# Patient Record
Sex: Female | Born: 1945 | Race: Black or African American | Hispanic: No | Marital: Married | Smoking: Never smoker
Health system: Southern US, Community
[De-identification: ages and names within clinical notes are randomized; demographics above are authoritative.]

## PROBLEM LIST (undated history)

## (undated) DIAGNOSIS — L719 Rosacea, unspecified: Secondary | ICD-10-CM

## (undated) DIAGNOSIS — R011 Cardiac murmur, unspecified: Secondary | ICD-10-CM

## (undated) DIAGNOSIS — K9089 Other intestinal malabsorption: Secondary | ICD-10-CM

## (undated) DIAGNOSIS — K635 Polyp of colon: Secondary | ICD-10-CM

## (undated) DIAGNOSIS — K644 Residual hemorrhoidal skin tags: Secondary | ICD-10-CM

## (undated) DIAGNOSIS — T7840XA Allergy, unspecified, initial encounter: Secondary | ICD-10-CM

## (undated) DIAGNOSIS — K5792 Diverticulitis of intestine, part unspecified, without perforation or abscess without bleeding: Secondary | ICD-10-CM

## (undated) DIAGNOSIS — M797 Fibromyalgia: Secondary | ICD-10-CM

## (undated) DIAGNOSIS — IMO0002 Reserved for concepts with insufficient information to code with codable children: Secondary | ICD-10-CM

## (undated) DIAGNOSIS — M1990 Unspecified osteoarthritis, unspecified site: Secondary | ICD-10-CM

## (undated) DIAGNOSIS — H409 Unspecified glaucoma: Secondary | ICD-10-CM

## (undated) DIAGNOSIS — R42 Dizziness and giddiness: Secondary | ICD-10-CM

## (undated) DIAGNOSIS — I773 Arterial fibromuscular dysplasia: Secondary | ICD-10-CM

## (undated) DIAGNOSIS — E039 Hypothyroidism, unspecified: Secondary | ICD-10-CM

## (undated) DIAGNOSIS — K2980 Duodenitis without bleeding: Secondary | ICD-10-CM

## (undated) DIAGNOSIS — I10 Essential (primary) hypertension: Secondary | ICD-10-CM

## (undated) DIAGNOSIS — M810 Age-related osteoporosis without current pathological fracture: Secondary | ICD-10-CM

## (undated) DIAGNOSIS — K589 Irritable bowel syndrome without diarrhea: Secondary | ICD-10-CM

## (undated) DIAGNOSIS — K5289 Other specified noninfective gastroenteritis and colitis: Secondary | ICD-10-CM

## (undated) DIAGNOSIS — K222 Esophageal obstruction: Secondary | ICD-10-CM

## (undated) DIAGNOSIS — K219 Gastro-esophageal reflux disease without esophagitis: Secondary | ICD-10-CM

## (undated) DIAGNOSIS — F419 Anxiety disorder, unspecified: Secondary | ICD-10-CM

## (undated) DIAGNOSIS — K5790 Diverticulosis of intestine, part unspecified, without perforation or abscess without bleeding: Secondary | ICD-10-CM

## (undated) DIAGNOSIS — K209 Esophagitis, unspecified: Secondary | ICD-10-CM

## (undated) DIAGNOSIS — K449 Diaphragmatic hernia without obstruction or gangrene: Secondary | ICD-10-CM

## (undated) DIAGNOSIS — H269 Unspecified cataract: Secondary | ICD-10-CM

## (undated) DIAGNOSIS — K3184 Gastroparesis: Secondary | ICD-10-CM

## (undated) DIAGNOSIS — E785 Hyperlipidemia, unspecified: Secondary | ICD-10-CM

## (undated) HISTORY — DX: Residual hemorrhoidal skin tags: K64.4

## (undated) HISTORY — DX: Duodenitis without bleeding: K29.80

## (undated) HISTORY — DX: Reserved for concepts with insufficient information to code with codable children: IMO0002

## (undated) HISTORY — DX: Diaphragmatic hernia without obstruction or gangrene: K44.9

## (undated) HISTORY — DX: Polyp of colon: K63.5

## (undated) HISTORY — DX: Arterial fibromuscular dysplasia: I77.3

## (undated) HISTORY — DX: Gastro-esophageal reflux disease without esophagitis: K21.9

## (undated) HISTORY — DX: Irritable bowel syndrome, unspecified: K58.9

## (undated) HISTORY — DX: Unspecified osteoarthritis, unspecified site: M19.90

## (undated) HISTORY — DX: Hyperlipidemia, unspecified: E78.5

## (undated) HISTORY — PX: COLONOSCOPY: SHX174

## (undated) HISTORY — DX: Hypothyroidism, unspecified: E03.9

## (undated) HISTORY — DX: Other intestinal malabsorption: K90.89

## (undated) HISTORY — DX: Unspecified cataract: H26.9

## (undated) HISTORY — DX: Fibromyalgia: M79.7

## (undated) HISTORY — DX: Dizziness and giddiness: R42

## (undated) HISTORY — DX: Rosacea, unspecified: L71.9

## (undated) HISTORY — DX: Diverticulosis of intestine, part unspecified, without perforation or abscess without bleeding: K57.90

## (undated) HISTORY — DX: Cardiac murmur, unspecified: R01.1

## (undated) HISTORY — DX: Essential (primary) hypertension: I10

## (undated) HISTORY — DX: Unspecified glaucoma: H40.9

## (undated) HISTORY — DX: Allergy, unspecified, initial encounter: T78.40XA

## (undated) HISTORY — DX: Esophagitis, unspecified: K20.9

## (undated) HISTORY — DX: Anxiety disorder, unspecified: F41.9

## (undated) HISTORY — DX: Age-related osteoporosis without current pathological fracture: M81.0

## (undated) HISTORY — DX: Other specified noninfective gastroenteritis and colitis: K52.89

## (undated) HISTORY — DX: Gastroparesis: K31.84

## (undated) HISTORY — DX: Esophageal obstruction: K22.2

---

## 1981-04-14 HISTORY — PX: TUBAL LIGATION: SHX77

## 1983-04-15 HISTORY — PX: CHOLECYSTECTOMY: SHX55

## 1991-04-15 HISTORY — PX: VAGINAL HYSTERECTOMY: SUR661

## 1998-01-08 ENCOUNTER — Other Ambulatory Visit: Admission: RE | Admit: 1998-01-08 | Discharge: 1998-01-08 | Payer: Self-pay | Admitting: Gastroenterology

## 2000-07-17 ENCOUNTER — Other Ambulatory Visit: Admission: RE | Admit: 2000-07-17 | Discharge: 2000-07-17 | Payer: Self-pay | Admitting: Gastroenterology

## 2000-07-17 ENCOUNTER — Encounter (INDEPENDENT_AMBULATORY_CARE_PROVIDER_SITE_OTHER): Payer: Self-pay | Admitting: Specialist

## 2000-10-29 ENCOUNTER — Encounter: Payer: Self-pay | Admitting: Emergency Medicine

## 2000-10-29 ENCOUNTER — Emergency Department (HOSPITAL_COMMUNITY): Admission: EM | Admit: 2000-10-29 | Discharge: 2000-10-29 | Payer: Self-pay | Admitting: Emergency Medicine

## 2001-02-11 ENCOUNTER — Other Ambulatory Visit: Admission: RE | Admit: 2001-02-11 | Discharge: 2001-02-11 | Payer: Self-pay | Admitting: Obstetrics and Gynecology

## 2002-10-31 ENCOUNTER — Encounter: Admission: RE | Admit: 2002-10-31 | Discharge: 2002-10-31 | Payer: Self-pay | Admitting: Pulmonary Disease

## 2002-10-31 ENCOUNTER — Encounter: Payer: Self-pay | Admitting: Pulmonary Disease

## 2003-04-23 ENCOUNTER — Ambulatory Visit (HOSPITAL_COMMUNITY): Admission: RE | Admit: 2003-04-23 | Discharge: 2003-04-23 | Payer: Self-pay | Admitting: Otolaryngology

## 2003-11-07 ENCOUNTER — Other Ambulatory Visit: Admission: RE | Admit: 2003-11-07 | Discharge: 2003-11-07 | Payer: Self-pay | Admitting: Obstetrics and Gynecology

## 2004-02-21 ENCOUNTER — Ambulatory Visit: Payer: Self-pay | Admitting: Pulmonary Disease

## 2004-04-14 HISTORY — PX: FOOT OSTEOTOMY: SHX957

## 2004-05-09 ENCOUNTER — Ambulatory Visit: Payer: Self-pay | Admitting: Gastroenterology

## 2004-05-10 ENCOUNTER — Ambulatory Visit: Payer: Self-pay | Admitting: Gastroenterology

## 2004-05-10 DIAGNOSIS — K5790 Diverticulosis of intestine, part unspecified, without perforation or abscess without bleeding: Secondary | ICD-10-CM | POA: Insufficient documentation

## 2004-05-10 DIAGNOSIS — K644 Residual hemorrhoidal skin tags: Secondary | ICD-10-CM

## 2004-05-10 HISTORY — DX: Diverticulosis of intestine, part unspecified, without perforation or abscess without bleeding: K57.90

## 2004-05-10 HISTORY — DX: Residual hemorrhoidal skin tags: K64.4

## 2004-05-23 ENCOUNTER — Ambulatory Visit: Payer: Self-pay | Admitting: Pulmonary Disease

## 2004-06-14 ENCOUNTER — Ambulatory Visit: Payer: Self-pay | Admitting: Pulmonary Disease

## 2004-06-18 ENCOUNTER — Ambulatory Visit: Payer: Self-pay | Admitting: Pulmonary Disease

## 2004-07-16 ENCOUNTER — Ambulatory Visit: Payer: Self-pay | Admitting: Pulmonary Disease

## 2004-10-14 ENCOUNTER — Ambulatory Visit: Payer: Self-pay | Admitting: Pulmonary Disease

## 2004-10-30 ENCOUNTER — Emergency Department (HOSPITAL_COMMUNITY): Admission: EM | Admit: 2004-10-30 | Discharge: 2004-10-30 | Payer: Self-pay | Admitting: Emergency Medicine

## 2004-10-30 ENCOUNTER — Ambulatory Visit: Payer: Self-pay | Admitting: Pulmonary Disease

## 2004-10-30 ENCOUNTER — Observation Stay (HOSPITAL_COMMUNITY): Admission: AD | Admit: 2004-10-30 | Discharge: 2004-10-31 | Payer: Self-pay | Admitting: Pulmonary Disease

## 2004-10-31 ENCOUNTER — Ambulatory Visit: Payer: Self-pay | Admitting: Pulmonary Disease

## 2004-11-05 ENCOUNTER — Ambulatory Visit: Payer: Self-pay | Admitting: Pulmonary Disease

## 2005-01-28 ENCOUNTER — Ambulatory Visit: Payer: Self-pay | Admitting: Pulmonary Disease

## 2005-03-18 ENCOUNTER — Ambulatory Visit: Payer: Self-pay | Admitting: Gastroenterology

## 2005-04-10 ENCOUNTER — Ambulatory Visit: Payer: Self-pay | Admitting: Pulmonary Disease

## 2005-04-18 ENCOUNTER — Ambulatory Visit: Payer: Self-pay | Admitting: Gastroenterology

## 2005-04-25 ENCOUNTER — Ambulatory Visit: Payer: Self-pay | Admitting: Gastroenterology

## 2005-04-25 DIAGNOSIS — K209 Esophagitis, unspecified without bleeding: Secondary | ICD-10-CM | POA: Insufficient documentation

## 2005-04-25 DIAGNOSIS — K449 Diaphragmatic hernia without obstruction or gangrene: Secondary | ICD-10-CM

## 2005-04-25 DIAGNOSIS — K2980 Duodenitis without bleeding: Secondary | ICD-10-CM | POA: Insufficient documentation

## 2005-04-25 HISTORY — DX: Diaphragmatic hernia without obstruction or gangrene: K44.9

## 2005-04-25 HISTORY — DX: Duodenitis without bleeding: K29.80

## 2005-04-25 HISTORY — DX: Esophagitis, unspecified without bleeding: K20.90

## 2005-04-28 ENCOUNTER — Ambulatory Visit: Payer: Self-pay | Admitting: Pulmonary Disease

## 2005-06-03 ENCOUNTER — Ambulatory Visit: Payer: Self-pay | Admitting: Gastroenterology

## 2005-07-01 ENCOUNTER — Ambulatory Visit: Payer: Self-pay | Admitting: Gastroenterology

## 2005-08-08 ENCOUNTER — Ambulatory Visit: Payer: Self-pay | Admitting: Pulmonary Disease

## 2005-12-05 ENCOUNTER — Ambulatory Visit: Payer: Self-pay | Admitting: Pulmonary Disease

## 2006-03-17 ENCOUNTER — Ambulatory Visit (HOSPITAL_BASED_OUTPATIENT_CLINIC_OR_DEPARTMENT_OTHER): Admission: RE | Admit: 2006-03-17 | Discharge: 2006-03-17 | Payer: Self-pay | Admitting: Orthopaedic Surgery

## 2006-05-05 ENCOUNTER — Ambulatory Visit: Payer: Self-pay | Admitting: Gastroenterology

## 2006-06-05 ENCOUNTER — Ambulatory Visit: Payer: Self-pay | Admitting: Pulmonary Disease

## 2006-06-23 ENCOUNTER — Ambulatory Visit: Payer: Self-pay | Admitting: Gastroenterology

## 2006-07-01 ENCOUNTER — Ambulatory Visit: Payer: Self-pay | Admitting: Pulmonary Disease

## 2006-07-14 ENCOUNTER — Ambulatory Visit: Payer: Self-pay | Admitting: Gastroenterology

## 2006-11-13 ENCOUNTER — Ambulatory Visit: Payer: Self-pay | Admitting: Pulmonary Disease

## 2007-02-05 ENCOUNTER — Ambulatory Visit: Payer: Self-pay | Admitting: Pulmonary Disease

## 2007-03-05 ENCOUNTER — Ambulatory Visit: Payer: Self-pay | Admitting: Gastroenterology

## 2007-03-05 LAB — CONVERTED CEMR LAB
Basophils Relative: 0.1 % (ref 0.0–1.0)
Eosinophils Relative: 1.8 % (ref 0.0–5.0)
Ferritin: 175.2 ng/mL (ref 10.0–291.0)
Folate: 10.8 ng/mL
Hemoglobin: 13.2 g/dL (ref 12.0–15.0)
Lymphocytes Relative: 59.4 % — ABNORMAL HIGH (ref 12.0–46.0)
Monocytes Absolute: 0.6 10*3/uL (ref 0.2–0.7)
Neutro Abs: 2.8 10*3/uL (ref 1.4–7.7)
Platelets: 419 10*3/uL — ABNORMAL HIGH (ref 150–400)
Saturation Ratios: 28.9 % (ref 20.0–50.0)
Transferrin: 313.7 mg/dL (ref >=212.0)
WBC: 8.6 10*3/uL (ref 4.5–10.5)

## 2007-03-12 ENCOUNTER — Ambulatory Visit: Payer: Self-pay | Admitting: Pulmonary Disease

## 2007-03-30 ENCOUNTER — Encounter: Payer: Self-pay | Admitting: Pulmonary Disease

## 2007-03-30 DIAGNOSIS — F411 Generalized anxiety disorder: Secondary | ICD-10-CM | POA: Insufficient documentation

## 2007-03-30 DIAGNOSIS — K5730 Diverticulosis of large intestine without perforation or abscess without bleeding: Secondary | ICD-10-CM | POA: Insufficient documentation

## 2007-03-30 DIAGNOSIS — K449 Diaphragmatic hernia without obstruction or gangrene: Secondary | ICD-10-CM | POA: Insufficient documentation

## 2007-03-30 DIAGNOSIS — M797 Fibromyalgia: Secondary | ICD-10-CM

## 2007-04-06 ENCOUNTER — Ambulatory Visit: Payer: Self-pay | Admitting: Gastroenterology

## 2007-04-30 ENCOUNTER — Ambulatory Visit: Payer: Self-pay | Admitting: Pulmonary Disease

## 2007-05-14 ENCOUNTER — Ambulatory Visit: Payer: Self-pay | Admitting: Pulmonary Disease

## 2007-05-14 DIAGNOSIS — R0789 Other chest pain: Secondary | ICD-10-CM | POA: Insufficient documentation

## 2007-05-14 DIAGNOSIS — L719 Rosacea, unspecified: Secondary | ICD-10-CM | POA: Insufficient documentation

## 2007-05-14 DIAGNOSIS — R0989 Other specified symptoms and signs involving the circulatory and respiratory systems: Secondary | ICD-10-CM

## 2007-05-14 DIAGNOSIS — M1990 Unspecified osteoarthritis, unspecified site: Secondary | ICD-10-CM | POA: Insufficient documentation

## 2007-05-14 DIAGNOSIS — I10 Essential (primary) hypertension: Secondary | ICD-10-CM | POA: Insufficient documentation

## 2007-05-17 ENCOUNTER — Telehealth (INDEPENDENT_AMBULATORY_CARE_PROVIDER_SITE_OTHER): Payer: Self-pay | Admitting: *Deleted

## 2007-05-21 ENCOUNTER — Ambulatory Visit: Payer: Self-pay

## 2007-05-21 ENCOUNTER — Encounter: Payer: Self-pay | Admitting: Pulmonary Disease

## 2007-06-09 ENCOUNTER — Encounter: Payer: Self-pay | Admitting: Pulmonary Disease

## 2007-07-12 ENCOUNTER — Encounter: Payer: Self-pay | Admitting: Pulmonary Disease

## 2007-07-15 ENCOUNTER — Encounter: Payer: Self-pay | Admitting: Pulmonary Disease

## 2007-07-28 ENCOUNTER — Ambulatory Visit: Payer: Self-pay | Admitting: Pulmonary Disease

## 2007-07-29 LAB — CONVERTED CEMR LAB
Bilirubin Urine: NEGATIVE
Ketones, ur: NEGATIVE mg/dL
Nitrite: NEGATIVE
Total Protein, Urine: NEGATIVE mg/dL

## 2007-08-10 ENCOUNTER — Ambulatory Visit: Payer: Self-pay | Admitting: Pulmonary Disease

## 2007-08-31 ENCOUNTER — Ambulatory Visit: Payer: Self-pay | Admitting: Pulmonary Disease

## 2007-09-02 ENCOUNTER — Emergency Department (HOSPITAL_COMMUNITY): Admission: EM | Admit: 2007-09-02 | Discharge: 2007-09-02 | Payer: Self-pay | Admitting: Emergency Medicine

## 2007-09-10 ENCOUNTER — Ambulatory Visit: Payer: Self-pay | Admitting: Gastroenterology

## 2007-09-13 ENCOUNTER — Ambulatory Visit: Payer: Self-pay | Admitting: Gastroenterology

## 2007-09-13 ENCOUNTER — Encounter: Payer: Self-pay | Admitting: Gastroenterology

## 2007-09-13 DIAGNOSIS — K219 Gastro-esophageal reflux disease without esophagitis: Secondary | ICD-10-CM

## 2007-09-13 DIAGNOSIS — K222 Esophageal obstruction: Secondary | ICD-10-CM | POA: Insufficient documentation

## 2007-09-13 HISTORY — DX: Esophageal obstruction: K22.2

## 2007-09-13 HISTORY — DX: Gastro-esophageal reflux disease without esophagitis: K21.9

## 2007-09-14 ENCOUNTER — Ambulatory Visit: Payer: Self-pay | Admitting: Internal Medicine

## 2007-09-19 ENCOUNTER — Encounter: Payer: Self-pay | Admitting: Gastroenterology

## 2007-09-24 ENCOUNTER — Encounter: Payer: Self-pay | Admitting: Pulmonary Disease

## 2007-09-29 ENCOUNTER — Ambulatory Visit: Payer: Self-pay | Admitting: Pulmonary Disease

## 2007-09-30 ENCOUNTER — Encounter: Payer: Self-pay | Admitting: Gastroenterology

## 2007-10-04 ENCOUNTER — Telehealth: Payer: Self-pay | Admitting: Gastroenterology

## 2007-10-04 LAB — CONVERTED CEMR LAB
ALT: 16 units/L (ref 0–35)
AST: 17 units/L (ref 0–37)
Alkaline Phosphatase: 50 units/L (ref 39–117)
BUN: 15 mg/dL (ref 6–23)
Basophils Relative: 0.5 % (ref 0.0–1.0)
Bilirubin Urine: NEGATIVE
CO2: 31 meq/L (ref 19–32)
Chloride: 104 meq/L (ref 96–112)
Creatinine, Ser: 1 mg/dL (ref 0.4–1.2)
Direct LDL: 138.1 mg/dL
Eosinophils Relative: 2.4 % (ref 0.0–5.0)
GFR calc non Af Amer: 60 mL/min
Hemoglobin: 12.8 g/dL (ref 12.0–15.0)
Ketones, ur: NEGATIVE mg/dL
Lymphocytes Relative: 45.1 % (ref 12.0–46.0)
MCHC: 34 g/dL (ref 30.0–36.0)
Monocytes Relative: 6.6 % (ref 3.0–12.0)
Neutro Abs: 3 10*3/uL (ref 1.4–7.7)
Neutrophils Relative %: 45.4 % (ref 43.0–77.0)
Potassium: 3.6 meq/L (ref 3.5–5.1)
RBC: 4.01 M/uL (ref 3.87–5.11)
Specific Gravity, Urine: >=1.03 (ref 1.000–1.03)
Total Bilirubin: 0.6 mg/dL (ref 0.3–1.2)
Total CHOL/HDL Ratio: 3.3
Urine Glucose: NEGATIVE mg/dL
VLDL: 18 mg/dL (ref 0–40)
WBC: 6.7 10*3/uL (ref 4.5–10.5)
pH: 5.5 (ref 5.0–8.0)

## 2007-10-05 ENCOUNTER — Encounter: Payer: Self-pay | Admitting: Pulmonary Disease

## 2007-10-21 ENCOUNTER — Encounter: Payer: Self-pay | Admitting: Pulmonary Disease

## 2007-11-11 ENCOUNTER — Telehealth: Payer: Self-pay | Admitting: Gastroenterology

## 2007-12-22 ENCOUNTER — Encounter: Payer: Self-pay | Admitting: Pulmonary Disease

## 2008-01-28 ENCOUNTER — Encounter: Payer: Self-pay | Admitting: Pulmonary Disease

## 2008-01-31 ENCOUNTER — Encounter: Payer: Self-pay | Admitting: Pulmonary Disease

## 2008-02-07 ENCOUNTER — Encounter: Payer: Self-pay | Admitting: Pulmonary Disease

## 2008-03-13 ENCOUNTER — Ambulatory Visit: Payer: Self-pay | Admitting: Pulmonary Disease

## 2008-03-17 ENCOUNTER — Telehealth: Payer: Self-pay | Admitting: Pulmonary Disease

## 2008-03-19 LAB — CONVERTED CEMR LAB
Albumin: 3.8 g/dL (ref 3.5–5.2)
BUN: 12 mg/dL (ref 6–23)
Calcium: 9.6 mg/dL (ref 8.4–10.5)
Chloride: 102 meq/L (ref 96–112)
Cholesterol: 188 mg/dL (ref 0–200)
Creatinine, Ser: 0.9 mg/dL (ref 0.4–1.2)
GFR calc Af Amer: 82 mL/min
GFR calc non Af Amer: 68 mL/min
LDL Cholesterol: 89 mg/dL (ref 0–99)
Total Bilirubin: 0.4 mg/dL (ref 0.3–1.2)
Total CHOL/HDL Ratio: 2.3
Total CK: 168 units/L (ref 7–177)
Triglycerides: 95 mg/dL (ref 0–149)
VLDL: 19 mg/dL (ref 0–40)
Vit D, 1,25-Dihydroxy: 51 (ref 30–89)

## 2008-03-29 ENCOUNTER — Telehealth: Payer: Self-pay | Admitting: Gastroenterology

## 2008-03-30 ENCOUNTER — Ambulatory Visit: Payer: Self-pay | Admitting: Gastroenterology

## 2008-03-30 DIAGNOSIS — R1084 Generalized abdominal pain: Secondary | ICD-10-CM

## 2008-03-30 LAB — CONVERTED CEMR LAB
ALT: 18 units/L (ref 0–35)
AST: 17 units/L (ref 0–37)
Albumin: 3.5 g/dL (ref 3.5–5.2)
Alkaline Phosphatase: 55 units/L (ref 39–117)
Basophils Absolute: 0.3 10*3/uL — ABNORMAL HIGH (ref 0.0–0.1)
Basophils Relative: 3.7 % — ABNORMAL HIGH (ref 0.0–3.0)
Eosinophils Relative: 1.7 % (ref 0.0–5.0)
Hemoglobin: 11.8 g/dL — ABNORMAL LOW (ref 12.0–15.0)
Lipase: 24 units/L (ref 11.0–59.0)
Lymphocytes Relative: 35.8 % (ref 12.0–46.0)
MCHC: 34 g/dL (ref 30.0–36.0)
Monocytes Relative: 9.5 % (ref 3.0–12.0)
Neutro Abs: 4.4 10*3/uL (ref 1.4–7.7)
Neutrophils Relative %: 49.3 % (ref 43.0–77.0)
RBC: 3.8 M/uL — ABNORMAL LOW (ref 3.87–5.11)
Sed Rate: 48 mm/hr — ABNORMAL HIGH (ref 0–22)
WBC: 9 10*3/uL (ref 4.5–10.5)

## 2008-03-31 ENCOUNTER — Telehealth: Payer: Self-pay | Admitting: Gastroenterology

## 2008-04-03 ENCOUNTER — Telehealth: Payer: Self-pay | Admitting: Gastroenterology

## 2008-04-04 ENCOUNTER — Ambulatory Visit: Payer: Self-pay | Admitting: Internal Medicine

## 2008-04-06 ENCOUNTER — Ambulatory Visit: Payer: Self-pay | Admitting: Gastroenterology

## 2008-04-06 DIAGNOSIS — R141 Gas pain: Secondary | ICD-10-CM

## 2008-04-06 DIAGNOSIS — R142 Eructation: Secondary | ICD-10-CM

## 2008-04-06 DIAGNOSIS — R143 Flatulence: Secondary | ICD-10-CM

## 2008-04-13 ENCOUNTER — Telehealth: Payer: Self-pay | Admitting: Gastroenterology

## 2008-04-17 ENCOUNTER — Ambulatory Visit: Payer: Self-pay | Admitting: Gastroenterology

## 2008-05-22 ENCOUNTER — Encounter: Payer: Self-pay | Admitting: Pulmonary Disease

## 2008-06-05 ENCOUNTER — Encounter: Payer: Self-pay | Admitting: Pulmonary Disease

## 2008-06-12 ENCOUNTER — Ambulatory Visit: Payer: Self-pay | Admitting: Internal Medicine

## 2008-06-14 LAB — CONVERTED CEMR LAB
AST: 19 units/L (ref 0–37)
Alkaline Phosphatase: 47 units/L (ref 39–117)
Bilirubin, Direct: 0.1 mg/dL (ref 0.0–0.3)
Total Protein: 6.9 g/dL (ref 6.0–8.3)

## 2008-06-21 ENCOUNTER — Encounter: Payer: Self-pay | Admitting: Pulmonary Disease

## 2008-07-10 ENCOUNTER — Ambulatory Visit: Payer: Self-pay | Admitting: Internal Medicine

## 2008-07-17 ENCOUNTER — Encounter: Payer: Self-pay | Admitting: Gastroenterology

## 2008-07-21 ENCOUNTER — Encounter: Admission: RE | Admit: 2008-07-21 | Discharge: 2008-07-21 | Payer: Self-pay | Admitting: Neurology

## 2008-07-21 ENCOUNTER — Encounter: Payer: Self-pay | Admitting: Pulmonary Disease

## 2008-07-29 ENCOUNTER — Encounter: Admission: RE | Admit: 2008-07-29 | Discharge: 2008-07-29 | Payer: Self-pay | Admitting: Neurology

## 2008-07-29 ENCOUNTER — Encounter: Payer: Self-pay | Admitting: Pulmonary Disease

## 2008-08-07 ENCOUNTER — Telehealth: Payer: Self-pay | Admitting: Gastroenterology

## 2008-08-08 ENCOUNTER — Ambulatory Visit: Payer: Self-pay | Admitting: Gastroenterology

## 2008-08-08 ENCOUNTER — Encounter: Payer: Self-pay | Admitting: Physician Assistant

## 2008-08-10 ENCOUNTER — Ambulatory Visit: Payer: Self-pay | Admitting: Gastroenterology

## 2008-08-11 LAB — CONVERTED CEMR LAB
Basophils Relative: 0.8 % (ref 0.0–3.0)
Eosinophils Relative: 1.9 % (ref 0.0–5.0)
HCT: 35.4 % — ABNORMAL LOW (ref 36.0–46.0)
Lymphs Abs: 4 10*3/uL (ref 0.7–4.0)
MCHC: 35.5 g/dL (ref 30.0–36.0)
MCV: 90.5 fL (ref 78.0–100.0)
Monocytes Absolute: 0.8 10*3/uL (ref 0.1–1.0)
RBC: 3.91 M/uL (ref 3.87–5.11)
WBC: 8.8 10*3/uL (ref 4.5–10.5)

## 2008-08-14 ENCOUNTER — Ambulatory Visit: Payer: Self-pay | Admitting: Pulmonary Disease

## 2008-08-14 DIAGNOSIS — R350 Frequency of micturition: Secondary | ICD-10-CM | POA: Insufficient documentation

## 2008-08-15 LAB — CONVERTED CEMR LAB
Bilirubin Urine: NEGATIVE
Ketones, ur: NEGATIVE mg/dL
Total Protein, Urine: NEGATIVE mg/dL
Urine Glucose: NEGATIVE mg/dL
pH: 5.5 (ref 5.0–8.0)

## 2008-09-14 ENCOUNTER — Telehealth (INDEPENDENT_AMBULATORY_CARE_PROVIDER_SITE_OTHER): Payer: Self-pay | Admitting: *Deleted

## 2008-09-14 ENCOUNTER — Encounter (INDEPENDENT_AMBULATORY_CARE_PROVIDER_SITE_OTHER): Payer: Self-pay | Admitting: *Deleted

## 2008-09-20 ENCOUNTER — Telehealth: Payer: Self-pay | Admitting: Gastroenterology

## 2008-09-28 ENCOUNTER — Ambulatory Visit: Payer: Self-pay | Admitting: Gastroenterology

## 2008-10-18 ENCOUNTER — Encounter: Payer: Self-pay | Admitting: Pulmonary Disease

## 2008-11-10 ENCOUNTER — Encounter: Payer: Self-pay | Admitting: Pulmonary Disease

## 2008-12-11 ENCOUNTER — Ambulatory Visit: Payer: Self-pay | Admitting: Pulmonary Disease

## 2008-12-11 DIAGNOSIS — J0110 Acute frontal sinusitis, unspecified: Secondary | ICD-10-CM

## 2008-12-11 DIAGNOSIS — J019 Acute sinusitis, unspecified: Secondary | ICD-10-CM | POA: Insufficient documentation

## 2008-12-15 ENCOUNTER — Ambulatory Visit: Payer: Self-pay | Admitting: Pulmonary Disease

## 2008-12-19 ENCOUNTER — Ambulatory Visit: Payer: Self-pay | Admitting: Pulmonary Disease

## 2009-01-04 LAB — CONVERTED CEMR LAB
Basophils Relative: 0.8 % (ref 0.0–3.0)
Bilirubin, Direct: 0.1 mg/dL (ref 0.0–0.3)
CO2: 30 meq/L (ref 19–32)
Calcium: 9.5 mg/dL (ref 8.4–10.5)
Creatinine, Ser: 0.9 mg/dL (ref 0.4–1.2)
Eosinophils Absolute: 0.2 10*3/uL (ref 0.0–0.7)
HDL: 63.8 mg/dL (ref >39.00)
Hemoglobin: 12.6 g/dL (ref 12.0–15.0)
LDL Cholesterol: 76 mg/dL (ref 0–99)
Lymphocytes Relative: 50.4 % — ABNORMAL HIGH (ref 12.0–46.0)
MCHC: 34 g/dL (ref 30.0–36.0)
Neutro Abs: 2.1 10*3/uL (ref 1.4–7.7)
RBC: 4 M/uL (ref 3.87–5.11)
Total Bilirubin: 0.5 mg/dL (ref 0.3–1.2)
Total CHOL/HDL Ratio: 3
Total Protein: 7.4 g/dL (ref 6.0–8.3)
Triglycerides: 101 mg/dL (ref 0.0–149.0)
VLDL: 20.2 mg/dL (ref 0.0–40.0)

## 2009-02-12 ENCOUNTER — Ambulatory Visit: Payer: Self-pay | Admitting: Pulmonary Disease

## 2009-02-15 ENCOUNTER — Encounter: Payer: Self-pay | Admitting: Pulmonary Disease

## 2009-02-22 ENCOUNTER — Encounter: Payer: Self-pay | Admitting: Pulmonary Disease

## 2009-03-27 ENCOUNTER — Encounter: Payer: Self-pay | Admitting: Pulmonary Disease

## 2009-04-20 ENCOUNTER — Encounter (INDEPENDENT_AMBULATORY_CARE_PROVIDER_SITE_OTHER): Payer: Self-pay | Admitting: *Deleted

## 2009-05-01 ENCOUNTER — Telehealth (INDEPENDENT_AMBULATORY_CARE_PROVIDER_SITE_OTHER): Payer: Self-pay | Admitting: *Deleted

## 2009-05-01 ENCOUNTER — Ambulatory Visit: Payer: Self-pay | Admitting: Gastroenterology

## 2009-05-01 ENCOUNTER — Encounter (INDEPENDENT_AMBULATORY_CARE_PROVIDER_SITE_OTHER): Payer: Self-pay | Admitting: *Deleted

## 2009-05-17 ENCOUNTER — Encounter (INDEPENDENT_AMBULATORY_CARE_PROVIDER_SITE_OTHER): Payer: Self-pay

## 2009-05-18 ENCOUNTER — Ambulatory Visit: Payer: Self-pay | Admitting: Gastroenterology

## 2009-05-23 ENCOUNTER — Telehealth: Payer: Self-pay | Admitting: Gastroenterology

## 2009-05-29 ENCOUNTER — Telehealth: Payer: Self-pay | Admitting: Gastroenterology

## 2009-05-29 ENCOUNTER — Telehealth (INDEPENDENT_AMBULATORY_CARE_PROVIDER_SITE_OTHER): Payer: Self-pay | Admitting: *Deleted

## 2009-05-29 ENCOUNTER — Ambulatory Visit: Payer: Self-pay | Admitting: Gastroenterology

## 2009-05-30 ENCOUNTER — Ambulatory Visit: Payer: Self-pay | Admitting: Gastroenterology

## 2009-05-30 DIAGNOSIS — K635 Polyp of colon: Secondary | ICD-10-CM

## 2009-05-30 HISTORY — DX: Polyp of colon: K63.5

## 2009-06-04 ENCOUNTER — Encounter: Payer: Self-pay | Admitting: Pulmonary Disease

## 2009-06-04 ENCOUNTER — Encounter: Payer: Self-pay | Admitting: Gastroenterology

## 2009-07-20 ENCOUNTER — Ambulatory Visit: Payer: Self-pay | Admitting: Pulmonary Disease

## 2009-07-20 DIAGNOSIS — E039 Hypothyroidism, unspecified: Secondary | ICD-10-CM | POA: Insufficient documentation

## 2009-07-20 DIAGNOSIS — E559 Vitamin D deficiency, unspecified: Secondary | ICD-10-CM

## 2009-07-22 LAB — CONVERTED CEMR LAB
Basophils Relative: 0.5 % (ref 0.0–3.0)
Bilirubin, Direct: 0.1 mg/dL (ref 0.0–0.3)
Calcium: 9.7 mg/dL (ref 8.4–10.5)
Creatinine, Ser: 0.9 mg/dL (ref 0.4–1.2)
Eosinophils Absolute: 0.2 10*3/uL (ref 0.0–0.7)
Eosinophils Relative: 2.4 % (ref 0.0–5.0)
GFR calc non Af Amer: 81.33 mL/min (ref >60)
HDL: 73 mg/dL (ref >39.00)
Hemoglobin: 12.8 g/dL (ref 12.0–15.0)
LDL Cholesterol: 85 mg/dL (ref 0–99)
MCHC: 33.8 g/dL (ref 30.0–36.0)
MCV: 91.4 fL (ref 78.0–100.0)
Monocytes Absolute: 0.6 10*3/uL (ref 0.1–1.0)
Neutro Abs: 3 10*3/uL (ref 1.4–7.7)
RBC: 4.15 M/uL (ref 3.87–5.11)
Total Bilirubin: 0.6 mg/dL (ref 0.3–1.2)
Total CHOL/HDL Ratio: 2
Triglycerides: 117 mg/dL (ref 0.0–149.0)
VLDL: 23.4 mg/dL (ref 0.0–40.0)

## 2009-08-24 ENCOUNTER — Ambulatory Visit: Payer: Self-pay | Admitting: Pulmonary Disease

## 2009-08-24 DIAGNOSIS — J309 Allergic rhinitis, unspecified: Secondary | ICD-10-CM | POA: Insufficient documentation

## 2009-09-14 ENCOUNTER — Telehealth: Payer: Self-pay | Admitting: Pulmonary Disease

## 2009-10-28 ENCOUNTER — Telehealth: Payer: Self-pay | Admitting: Internal Medicine

## 2009-12-05 ENCOUNTER — Encounter: Payer: Self-pay | Admitting: Pulmonary Disease

## 2009-12-07 ENCOUNTER — Telehealth: Payer: Self-pay | Admitting: Gastroenterology

## 2009-12-07 ENCOUNTER — Telehealth: Payer: Self-pay | Admitting: Pulmonary Disease

## 2009-12-07 ENCOUNTER — Ambulatory Visit: Payer: Self-pay | Admitting: Gastroenterology

## 2009-12-07 LAB — CONVERTED CEMR LAB
AST: 22 units/L (ref 0–37)
Albumin: 3.8 g/dL (ref 3.5–5.2)
BUN: 17 mg/dL (ref 6–23)
Basophils Relative: 0.1 % (ref 0.0–3.0)
Calcium: 9.9 mg/dL (ref 8.4–10.5)
Eosinophils Absolute: 0 10*3/uL (ref 0.0–0.7)
Ferritin: 45.6 ng/mL (ref 10.0–291.0)
Folate: 8.8 ng/mL
GFR calc non Af Amer: 85.61 mL/min (ref >60)
HCT: 36.1 % (ref 36.0–46.0)
Iron: 98 ug/dL (ref 42–145)
Lipase: 29 units/L (ref 11.0–59.0)
Lymphs Abs: 1.9 10*3/uL (ref 0.7–4.0)
MCHC: 34.2 g/dL (ref 30.0–36.0)
MCV: 92.6 fL (ref 78.0–100.0)
Monocytes Absolute: 0.7 10*3/uL (ref 0.1–1.0)
Neutrophils Relative %: 79.5 % — ABNORMAL HIGH (ref 43.0–77.0)
Platelets: 301 10*3/uL (ref 150.0–400.0)
Potassium: 3.9 meq/L (ref 3.5–5.1)
RBC: 3.9 M/uL (ref 3.87–5.11)
TSH: 0.1 microintl units/mL — ABNORMAL LOW (ref 0.35–5.50)
Total Bilirubin: 0.5 mg/dL (ref 0.3–1.2)
Vitamin B-12: 489 pg/mL (ref 211–911)

## 2009-12-10 ENCOUNTER — Encounter: Payer: Self-pay | Admitting: Gastroenterology

## 2009-12-11 ENCOUNTER — Encounter: Payer: Self-pay | Admitting: Gastroenterology

## 2009-12-12 ENCOUNTER — Ambulatory Visit: Payer: Self-pay | Admitting: Cardiovascular Disease

## 2009-12-14 ENCOUNTER — Telehealth: Payer: Self-pay | Admitting: Pulmonary Disease

## 2009-12-15 ENCOUNTER — Observation Stay (HOSPITAL_COMMUNITY): Admission: EM | Admit: 2009-12-15 | Discharge: 2009-12-16 | Payer: Self-pay | Admitting: Emergency Medicine

## 2009-12-16 ENCOUNTER — Encounter (INDEPENDENT_AMBULATORY_CARE_PROVIDER_SITE_OTHER): Payer: Self-pay | Admitting: Internal Medicine

## 2009-12-19 ENCOUNTER — Ambulatory Visit: Payer: Self-pay | Admitting: Pulmonary Disease

## 2009-12-19 ENCOUNTER — Encounter (INDEPENDENT_AMBULATORY_CARE_PROVIDER_SITE_OTHER): Payer: Self-pay | Admitting: *Deleted

## 2009-12-19 DIAGNOSIS — R002 Palpitations: Secondary | ICD-10-CM

## 2009-12-20 DIAGNOSIS — E785 Hyperlipidemia, unspecified: Secondary | ICD-10-CM

## 2009-12-20 DIAGNOSIS — E1169 Type 2 diabetes mellitus with other specified complication: Secondary | ICD-10-CM | POA: Insufficient documentation

## 2009-12-21 ENCOUNTER — Ambulatory Visit: Payer: Self-pay | Admitting: Gastroenterology

## 2009-12-24 ENCOUNTER — Ambulatory Visit: Payer: Self-pay | Admitting: Cardiology

## 2009-12-26 ENCOUNTER — Ambulatory Visit: Payer: Self-pay | Admitting: Gastroenterology

## 2009-12-28 ENCOUNTER — Encounter: Payer: Self-pay | Admitting: Gastroenterology

## 2009-12-28 ENCOUNTER — Encounter: Payer: Self-pay | Admitting: Pulmonary Disease

## 2009-12-28 DIAGNOSIS — I7789 Other specified disorders of arteries and arterioles: Secondary | ICD-10-CM

## 2009-12-31 ENCOUNTER — Encounter: Payer: Self-pay | Admitting: Pulmonary Disease

## 2009-12-31 ENCOUNTER — Ambulatory Visit: Payer: Self-pay

## 2010-01-04 ENCOUNTER — Telehealth (INDEPENDENT_AMBULATORY_CARE_PROVIDER_SITE_OTHER): Payer: Self-pay | Admitting: *Deleted

## 2010-01-07 ENCOUNTER — Ambulatory Visit (HOSPITAL_COMMUNITY): Admission: RE | Admit: 2010-01-07 | Discharge: 2010-01-07 | Payer: Self-pay | Admitting: Gastroenterology

## 2010-01-07 ENCOUNTER — Ambulatory Visit: Payer: Self-pay | Admitting: Gastroenterology

## 2010-01-28 ENCOUNTER — Ambulatory Visit: Payer: Self-pay | Admitting: Cardiology

## 2010-01-28 ENCOUNTER — Encounter: Payer: Self-pay | Admitting: Cardiology

## 2010-01-28 ENCOUNTER — Ambulatory Visit (HOSPITAL_COMMUNITY): Admission: RE | Admit: 2010-01-28 | Discharge: 2010-01-28 | Payer: Self-pay | Admitting: Cardiology

## 2010-01-28 ENCOUNTER — Ambulatory Visit: Payer: Self-pay

## 2010-02-11 ENCOUNTER — Ambulatory Visit: Payer: Self-pay | Admitting: Cardiology

## 2010-03-13 ENCOUNTER — Telehealth (INDEPENDENT_AMBULATORY_CARE_PROVIDER_SITE_OTHER): Payer: Self-pay | Admitting: *Deleted

## 2010-03-21 ENCOUNTER — Ambulatory Visit: Payer: Self-pay | Admitting: Pulmonary Disease

## 2010-04-18 ENCOUNTER — Ambulatory Visit: Admit: 2010-04-18 | Payer: Self-pay | Admitting: Pulmonary Disease

## 2010-04-19 ENCOUNTER — Telehealth: Payer: Self-pay | Admitting: Gastroenterology

## 2010-05-14 NOTE — Letter (Signed)
Summary: Patient Notice- Polyp Results  [facility]  [address] [place], [place] [address]   Phone: [phone]  Fax: [phone]        June 04, 2009 MRN: [id number]    [facility] [address] [place], [place]  [address]    Dear Ms. Fricke,  I am pleased to inform you that the colon polyp(s) removed during your recent colonoscopy was (were) found to be benign (no cancer detected) upon pathologic examination.  I recommend you have a repeat colonoscopy examination in 5_ years to look for recurrent polyps, as having colon polyps increases your risk for having recurrent polyps or even colon cancer in the future.  Should you develop new or worsening symptoms of abdominal pain, bowel habit changes or bleeding from the rectum or bowels, please schedule an evaluation with either your primary care physician or with me.  Additional information/recommendations:  __ No further action with gastroenterology is needed at this time. Please      follow-up with your primary care physician for your other healthcare      needs.  __ Please call [phone] to schedule a return visit to review your      situation.  __ Please keep your follow-up visit as already scheduled.  xx__ Continue treatment plan as outlined the day of your exam.  Please call us if you are having persistent problems or have questions about your condition that have not been fully answered at this time.  Sincerely,  Mardella Layman MD [facility]  This letter has been electronically signed by your physician.  Appended Document: Patient Notice- Polyp Results Letter mailed 2.22.11.

## 2010-05-14 NOTE — Progress Notes (Signed)
Summary: Questions about procedure   Phone Note Call from Patient Call back at [phone]   Caller: Patient Call For: Dr. Jarold Motto Reason for Call: Talk to Nurse Summary of Call: Pt has a colonoscopy scheduled for tomorrow and ate some peanuts yesterday. Approx. 2 handfuls and wants know if that will interfere Initial call taken by: Karna Christmas,  May 29, 2009 9:42 AM  Follow-up for Phone Call        questions answered when patient was here for her TB test,.  Follow-up by: Harlow Mares CMA (AAMA),  May 29, 2009 10:10 AM

## 2010-05-14 NOTE — Progress Notes (Signed)
Summary: medication questions  Phone Note Call from Patient Call back at [phone]   Caller: Patient Call For: Trellis Vanoverbeke Reason for Call: Talk to Nurse Summary of Call: Patient has questions about the medication she is taking. she would like to talk to a nurse about this.  Initial call taken by: Valinda Hoar,  December 07, 2009 11:47 AM  Follow-up for Phone Call        Pt c/o increased muscle weakness and muscle pain. Per specialist Dr. Marcene Corning and Dr. Amelia Jo. Dr. Clarisse Gouge has ordered pt to have blood work done "to check muscles". Pt wants to know if it is okay to come off of Simvastatin. Please advise. Thanks. Zackery Barefoot CMA  December 07, 2009 11:58 AM   Additional Follow-up for Phone Call Additional follow up Details #1::        per SN---yes this is ok to stop the simvastatin.  eplained to pt that she will just need to do low chol/low fat diet.  pt voiced her understanding of this and will call back for any questions or concerns. Randell Loop [facility]  December 07, 2009 3:57 PM

## 2010-05-14 NOTE — Progress Notes (Signed)
Summary: heart pounding x 3 days-req to be seen  Phone Note Call from Patient   Caller: Patient Call For: nadel Summary of Call: pt says she has awaken for 3 nights w/ "pounding in her chest". she says she has been sweating but says that's normal as she often has hot flashes. i asked her if she has had any shoulder/ neck pain, N orV or blurred vision/ slurred speach. she has denied all. she wanted to see tp (who is out) or dr nadel. i suggested the ER or 911 but she insists that she come here. she says she doesn't want to be told at the ER that she "just has indigestion". cell [phone] Initial call taken by: Tivis Ringer, CNA,  December 14, 2009 9:14 AM  Follow-up for Phone Call        called spoke with patient who states that she has been having intermittent chest pressure and pounding x2weeks, but has woken her up from a deep sleep for the last 3 nights with increased pounding, pressure and feels like her heart is racing.  she denies n/v, blurry vision or slurred speech.  pt does state that she has bursitis in the left shoulder and that pain has been more pronounced since the pain started.  i advised pt again to go to the ED but patient again refused, stating that she took her husband to the ED last week and had to wait for 9 hours.  please advise, thanks! Follow-up by: Boone Master CNA/MA,  December 14, 2009 9:31 AM  Additional Follow-up for Phone Call Additional follow up Details #1::        SN is out of the office this afternoon and  no openings this morning---she could go to the ER for eval if she wants to  or we can add her on to see SN on 9-7 at 2pm...per SN---we can either give her atenolol 50mg    1 by mouth once daily to help with the racing HR  or we can give her klonipin 0.5mg    1 by mouth two times a day to help with the catacholamin excess---he help reduce the chemicals given off by the adrenal gland.  she can have one or the other.  thanks Randell Loop CMA  December 14, 2009  11:51 AM     Additional Follow-up for Phone Call Additional follow up Details #2::    Pt called back.  Informed her of all of the above per SN.  Pt wishing to schedule appt with SN on 9.7.11 at 2pm vs going to the ER.  OV scheduled and pt was informed if sxs worsen or do not get better before OV it will be in her best interest to go to the ER for eval.  She verbalized understanding.  Pt decided to try the klonipin vs the atenolol -- requesting this to be sent to [facility] [address]  Klonipin rx sent -- pt aware.    Pt also has concerns if SN thought she needed to see one of our cardiologists.  States they could have done an EKG today. Pls advise.  Thanks! Gweneth Dimitri RN  December 14, 2009 2:31 PM   spoke with crystal to see if pt wanted a call back today about this since SN is out of the office---she stated that pt just wanted to bring this to SN attn---will leave message for SN about this---pt is going to keep her appt with SN next week and she can  discuss this with SN. Randell Loop CMA  December 14, 2009 2:49 PM    New/Updated Medications: KLONOPIN 0.5 MG TABS (CLONAZEPAM) 1 by mouth two times a day Prescriptions: KLONOPIN 0.5 MG TABS (CLONAZEPAM) 1 by mouth two times a day  #12 x 0   Entered by:   Gweneth Dimitri RN   Authorized by:   Michele Mcalpine MD   Signed by:   Randell Loop CMA on 12/14/2009   Method used:   Telephoned to ...       [facility]  [id number]       [address]       [place], [place]  [address]       Ph: [phone] or [phone]       Fax: [phone]   RxID:   [id number]

## 2010-05-14 NOTE — Assessment & Plan Note (Signed)
Summary: np6/eval chest pressure/palps  Medications Added METOPROLOL TARTRATE 25 MG TABS (METOPROLOL TARTRATE) 1/2 tab by mouth two times a day CLONAZEPAM 0.5 MG TABS (CLONAZEPAM) 1 tab by mouth two times a day        Referring Provider:  N/A Primary Provider:  Alroy Dust, MD   History of Present Illness: 65 year old female for evaluation of chest pain. Recently admitted in Sept 2011 with this problem and echocardiogram showed normal LV function. There was mild mitral regurgitation. Cardiac markers, d-dimer, liver functions were normal. The BNP less than 30. TSH normal. Over the past 3-4 weeks she has had intermittent palpitations described as her heart "racing". This lasts for approximately 5-15 minutes and resolves gradually. There is associated chest pressure, shortness of breath but no presyncope or syncope. It is more prominent at night and has awakened her from sleep. There is otherwise no dyspnea on exertion, orthopnea, PND, pedal edema or exertional chest pain. Because of the above we were asked to further evaluate.  Current Medications (verified): 1)  Nasonex 50 Mcg/act Susp (Mometasone Furoate) .... 2 Sprays in Each Nostril Daily 2)  Hydrochlorothiazide 25 Mg Tabs (Hydrochlorothiazide) .... Take 1 Tablet By Mouth Once A Day 3)  Klor-Con M20 20 Meq Tbcr (Potassium Chloride Crys Cr) .... Take 1 Tablet By Mouth Once A Day 4)  Synthroid 75 Mcg  Tabs (Levothyroxine Sodium) .Marland Kitchen.. 1 Tab Daily.Marland KitchenMarland Kitchen 5)  Nexium 40 Mg Cpdr (Esomeprazole Magnesium) .... Take 1 Capsule By Mouth Once A Day 6)  Carafate 1 Gm/89ml  Susp (Sucralfate) .... Take 10 Cc By Mouth As Needed 7)  Levsin 0.125 Mg  Tabs (Hyoscyamine Sulfate) .... Take One By Mouth Every 4 Hours As Needed 8)  Librax 2.5-5 Mg Caps (Clidinium-Chlordiazepoxide) .Marland Kitchen.. 1 By Mouth Three Times A Day Prn 9)  Pancreaze 16109 Unit Cpep (Pancrelipase (Lip-Prot-Amyl)) .... Three Times A Day 10)  Align   Caps (Misc Intestinal Flora Regulat) .... Take One  Capsule By Mouth Daily 11)  Premarin 0.625 Mg  Tabs (Estrogens Conjugated) .... Take 1 Tablet By Mouth Once A Day 12)  Celebrex 200 Mg Caps (Celecoxib) .Marland Kitchen.. 1 Tab By Mouth Once Daily As Needed For Arthritis Pain... 13)  Midrin 325-65-100 Mg  Caps (Apap-Isometheptene-Dichloral) .... Use As Needed For Headaches 14)  Lidoderm 5 % Ptch (Lidocaine) .... Use As Needed 15)  Skelaxin 800 Mg  Tabs (Metaxalone) .... Take 1 Tablet By Mouth Two Times A Day As Needed For Muscle Spasm... 16)  Vitamin D 60454 Unit  Caps (Ergocalciferol) .... Take One Tablet By Mouth Once A Week 17)  Hydrocodone-Acetaminophen 5-500 Mg Tabs (Hydrocodone-Acetaminophen) .Marland Kitchen.. 1 Tab Q 4-6 Hrs As Needed For Pain 18)  Klonopin 0.5 Mg Tabs (Clonazepam) .Marland Kitchen.. 1 By Mouth Two Times A Day As Directed... 19)  Metoprolol Tartrate 25 Mg Tabs (Metoprolol Tartrate) .... 1/2 Tab By Mouth Two Times A Day 20)  Clonazepam 0.5 Mg Tabs (Clonazepam) .Marland Kitchen.. 1 Tab By Mouth Two Times A Day  Allergies: 1)  ! Tetracycline 2)  ! Codeine  Past History:  Past Medical History: HYPERLIPIDEMIA  HYPERTENSION HYPOTHYROIDISM  HIATAL HERNIA  GERD  DIVERTICULAR DISEASE IRRITABLE BOWEL SYNDROME, HX OF  Peptic ulcer disease COLONIC POLYPS OSTEOARTHRITIS  FIBROMYALGIA VITAMIN D DEFICIENCY  MIGRAINE HEADACHE ANXIETY ACNE ROSACEA   Past Surgical History: Reviewed history from 12/20/2009 and no changes required. Cholecystectomy Hysterectomy C-section Right foot Chevron osteotomy.  Family History: Reviewed history from 04/06/2008 and no changes required. Mother with MVP Father - hypertension Sister -  hypertension, and DM Second sister alive at age 60 with significant medical history of hypertension Family History of Colon Cancer: Maternal Aunt Family History of Stomach Cancer: Maternal Aunt Family History of Colon Polyps: Father, Sister No premature CAD  Social History: Reviewed history from 04/06/2008 and no changes required. Patient is a  retired Production designer, theatre/television/film Patient states never smoked Patient is married with 2 children Alcohol Use - no Patient does not get regular exercise  Review of Systems       Complains of indigestion and abdominal bloating but no fevers or chills, productive cough, hemoptysis, dysphasia, odynophagia, melena, hematochezia, dysuria, hematuria, rash, seizure activity, orthopnea, PND, pedal edema, claudication. Remaining systems are negative.   Vital Signs:  Patient profile:   65 year old female Height:      62 inches Weight:      160 pounds BMI:     29.37 Pulse rate:   77 / minute Resp:     12 per minute BP sitting:   113 / 71  (left arm)  Vitals Entered By: Kem Parkinson (December 24, 2009 3:26 PM)  Physical Exam  General:  Well developed/well nourished in NAD Skin warm/dry Patient not depressed No peripheral clubbing Back-normal HEENT-normal/normal eyelids Neck supple/normal carotid upstroke bilaterally; no bruits; no JVD; no thyromegaly chest - CTA/ normal expansion CV - RRR/normal S1 and S2; no murmurs, rubs or gallops; PMI nondisplaced Abdomen -NT/ND, no HSM, no mass, + bowel sounds, no bruit 2+ femoral pulses, no bruits Ext-no edema, chords, 2+ DP Neuro-grossly nonfocal     Impression & Recommendations:  Problem # 1:  CHEST PAIN (ICD-786.50) Symptoms atypical. Schedule stress echocardiogram. Her updated medication list for this problem includes:    Metoprolol Tartrate 25 Mg Tabs (Metoprolol tartrate) .Marland Kitchen... 1/2 tab by mouth two times a day  Orders: Stress Echo (Stress Echo)  Problem # 2:  PALPITATIONS (ICD-785.1) Schedule CardioNet. Her updated medication list for this problem includes:    Metoprolol Tartrate 25 Mg Tabs (Metoprolol tartrate) .Marland Kitchen... 1/2 tab by mouth two times a day  Orders: Event (Event)  Problem # 3:  HYPERLIPIDEMIA (ICD-272.4) Management per primary care.  Problem # 4:  HYPERTENSION (ICD-401.9) Blood pressure controlled on present  medications. Will continue. Potassium and renal function monitored by primary care. Her updated medication list for this problem includes:    Hydrochlorothiazide 25 Mg Tabs (Hydrochlorothiazide) .Marland Kitchen... Take 1 tablet by mouth once a day    Metoprolol Tartrate 25 Mg Tabs (Metoprolol tartrate) .Marland Kitchen... 1/2 tab by mouth two times a day  Problem # 5:  HYPOTHYROIDISM (ICD-244.9)  Her updated medication list for this problem includes:    Synthroid 75 Mcg Tabs (Levothyroxine sodium) .Marland Kitchen... 1 tab daily...  Problem # 6:  FIBROMYALGIA (ICD-729.1)  Problem # 7:  IRRITABLE BOWEL SYNDROME, HX OF (ICD-V12.79)  Patient Instructions: 1)  Your physician recommends that you schedule a follow-up appointment in: 6-8 WEEKS 2)  Your physician has requested that you have a stress echocardiogram. For further information please visit [email or web address]  Please follow instruction sheet as given. 3)  Your physician has recommended that you wear an event monitor.  Event monitors are medical devices that record the heart's electrical activity. Doctors most often use these monitors to diagnose arrhythmias. Arrhythmias are problems with the speed or rhythm of the heartbeat. The monitor is a small, portable device. You can wear one while you do your normal daily activities. This is usually used to diagnose what is causing palpitations/syncope (passing out).

## 2010-05-14 NOTE — Assessment & Plan Note (Signed)
Summary: 6WK F/U SL  Medications Added METOPROLOL TARTRATE 25 MG TABS (METOPROLOL TARTRATE) 1 two times a day ASPIRIN 81 MG TBEC (ASPIRIN) 1 once daily * OVACE CREAM as needed        Referring Provider:  N/A Primary Provider:  Alroy Dust, MD   History of Present Illness: 65 year old female I saw in Sept 2061for evaluation of chest pain. Admitted in Sept 2011 with chest pain and echocardiogram showed normal LV function. There was mild mitral regurgitation. Cardiac markers, d-dimer, liver functions were normal. The BNP less than 30. TSH normal. Abdominal ultrasound in September of 2011 was normal. When I saw her previously she was complaining of palpitations. A stress echocardiogram was performed in September of 2011 and revealed normal LV function and no stress-induced wall motion abnormalities. A CardioNet was performed and revealed sinus rhythm with PVCs. Since I saw her previously she denies dyspnea on exertion, orthopnea, PND or chest pain. She continues to have palpitations associated with dyspnea. There are worse in the evening when she is relaxed.  Current Medications (verified): 1)  Nasonex 50 Mcg/act Susp (Mometasone Furoate) .... 2 Sprays in Each Nostril Daily 2)  Hydrochlorothiazide 25 Mg Tabs (Hydrochlorothiazide) .... Take 1 Tablet By Mouth Once A Day 3)  Klor-Con M20 20 Meq Tbcr (Potassium Chloride Crys Cr) .... Take 1 Tablet By Mouth Once A Day 4)  Synthroid 75 Mcg  Tabs (Levothyroxine Sodium) .Marland Kitchen.. 1 Tab Daily.Marland KitchenMarland Kitchen 5)  Nexium 40 Mg Cpdr (Esomeprazole Magnesium) .... Take 1 Capsule By Mouth Once A Day 6)  Carafate 1 Gm/7ml  Susp (Sucralfate) .... Take 10 Cc By Mouth As Needed 7)  Levsin 0.125 Mg  Tabs (Hyoscyamine Sulfate) .... Take One By Mouth Every 4 Hours As Needed 8)  Librax 2.5-5 Mg Caps (Clidinium-Chlordiazepoxide) .Marland Kitchen.. 1 By Mouth Three Times A Day Prn 9)  Pancreaze 27253 Unit Cpep (Pancrelipase (Lip-Prot-Amyl)) .... Three Times A Day 10)  Align   Caps (Misc Intestinal  Flora Regulat) .... Take One Capsule By Mouth Daily 11)  Premarin 0.625 Mg  Tabs (Estrogens Conjugated) .... Take 1 Tablet By Mouth Once A Day 12)  Celebrex 200 Mg Caps (Celecoxib) .Marland Kitchen.. 1 Tab By Mouth Once Daily As Needed For Arthritis Pain... 13)  Midrin 325-65-100 Mg  Caps (Apap-Isometheptene-Dichloral) .... Use As Needed For Headaches 14)  Lidoderm 5 % Ptch (Lidocaine) .... Use As Needed 15)  Skelaxin 800 Mg  Tabs (Metaxalone) .... Take 1 Tablet By Mouth Two Times A Day As Needed For Muscle Spasm... 16)  Vitamin D 66440 Unit  Caps (Ergocalciferol) .... Take One Tablet By Mouth Once A Week 17)  Hydrocodone-Acetaminophen 5-500 Mg Tabs (Hydrocodone-Acetaminophen) .Marland Kitchen.. 1 Tab Q 4-6 Hrs As Needed For Pain 18)  Metoprolol Tartrate 25 Mg Tabs (Metoprolol Tartrate) .... 1/2 Tab By Mouth Two Times A Day 19)  Clonazepam 0.5 Mg Tabs (Clonazepam) .Marland Kitchen.. 1 Tab By Mouth Two Times A Day 20)  Aspirin 81 Mg Tbec (Aspirin) .Marland Kitchen.. 1 Once Daily 21)  Ovace Cream .... As Needed  Allergies: 1)  ! Tetracycline 2)  ! Codeine  Past History:  Past Medical History: Reviewed history from 12/24/2009 and no changes required. HYPERLIPIDEMIA  HYPERTENSION HYPOTHYROIDISM  HIATAL HERNIA  GERD  DIVERTICULAR DISEASE IRRITABLE BOWEL SYNDROME, HX OF  Peptic ulcer disease COLONIC POLYPS OSTEOARTHRITIS  FIBROMYALGIA VITAMIN D DEFICIENCY  MIGRAINE HEADACHE ANXIETY ACNE ROSACEA   Past Surgical History: Reviewed history from 12/20/2009 and no changes required. Cholecystectomy Hysterectomy C-section Right foot Chevron osteotomy.  Social  History: Reviewed history from 12/24/2009 and no changes required. Patient is a retired Production designer, theatre/television/film Patient states never smoked Patient is married with 2 children Alcohol Use - no Patient does not get regular exercise  Review of Systems       no fevers or chills, productive cough, hemoptysis, dysphasia, odynophagia, melena, hematochezia, dysuria, hematuria, rash, seizure  activity, orthopnea, PND, pedal edema, claudication. Remaining systems are negative.   Vital Signs:  Patient profile:   65 year old female Height:      62 inches Weight:      158 pounds BMI:     29.00 Pulse rate:   78 / minute Resp:     12 per minute BP sitting:   118 / 80  (right arm)  Vitals Entered By: Kem Parkinson (February 11, 2010 10:25 AM)  Physical Exam  General:  Well-developed well-nourished in no acute distress.  Skin is warm and dry.  HEENT is normal.  Neck is supple. No thyromegaly.  Chest is clear to auscultation with normal expansion.  Cardiovascular exam is regular rate and rhythm.  Abdominal exam nontender or distended. No masses palpated. Extremities show no edema. neuro grossly intact    Impression & Recommendations:  Problem # 1:  PALPITATIONS (ICD-785.1) Her monitor shows PVCs. I explained that in the setting of normal LV function these are benign. I will increase her metoprolol to 25 mg p.o. b.i.d. for symptomatic relief. Her updated medication list for this problem includes:    Metoprolol Tartrate 25 Mg Tabs (Metoprolol tartrate) .Marland Kitchen... 1/2 tab by mouth two times a day    Aspirin 81 Mg Tbec (Aspirin) .Marland Kitchen... 1 once daily  Problem # 2:  CHEST PAIN (ICD-786.50)  No further symptoms. Stress echo normal. No further workup. Her updated medication list for this problem includes:    Metoprolol Tartrate 25 Mg Tabs (Metoprolol tartrate) .Marland Kitchen... 1 two times a day    Aspirin 81 Mg Tbec (Aspirin) .Marland Kitchen... 1 once daily  Her updated medication list for this problem includes:    Metoprolol Tartrate 25 Mg Tabs (Metoprolol tartrate) .Marland Kitchen... 1 two times a day    Aspirin 81 Mg Tbec (Aspirin) .Marland Kitchen... 1 once daily  Problem # 3:  HYPERLIPIDEMIA (ICD-272.4) Management per primary care.  Problem # 4:  HYPERTENSION (ICD-401.9)  Blood pressure controlled on present medications. Will continue. Her updated medication list for this problem includes:    Hydrochlorothiazide 25  Mg Tabs (Hydrochlorothiazide) .Marland Kitchen... Take 1 tablet by mouth once a day    Metoprolol Tartrate 25 Mg Tabs (Metoprolol tartrate) .Marland Kitchen... 1 two times a day    Aspirin 81 Mg Tbec (Aspirin) .Marland Kitchen... 1 once daily  Her updated medication list for this problem includes:    Hydrochlorothiazide 25 Mg Tabs (Hydrochlorothiazide) .Marland Kitchen... Take 1 tablet by mouth once a day    Metoprolol Tartrate 25 Mg Tabs (Metoprolol tartrate) .Marland Kitchen... 1 two times a day    Aspirin 81 Mg Tbec (Aspirin) .Marland Kitchen... 1 once daily  Problem # 5:  HYPOTHYROIDISM (ICD-244.9)  Her updated medication list for this problem includes:    Synthroid 75 Mcg Tabs (Levothyroxine sodium) .Marland Kitchen... 1 tab daily...  Her updated medication list for this problem includes:    Synthroid 75 Mcg Tabs (Levothyroxine sodium) .Marland Kitchen... 1 tab daily...  Problem # 6:  FIBROMYALGIA (ICD-729.1)  Patient Instructions: 1)  Your physician recommends that you schedule a follow-up appointment in: 3 MONTHS WITH DR CRENSHAW 2)  Your physician has recommended you make the following change in  your medication: INCREASE METOPROLOL TO 25 MG two times a day  Prescriptions: METOPROLOL TARTRATE 25 MG TABS (METOPROLOL TARTRATE) 1 two times a day  #60 x 11   Entered by:   Scherrie Bateman, LPN   Authorized by:   Ferman Hamming, MD, [facility]   Signed by:   Scherrie Bateman, LPN on 16/01/9603   Method used:   [facility] to        [facility]  [id number]       [address]       [place], [place]  [address]       Ph: [phone] or [phone]       Fax: [phone]   RxID:   [id number] METOPROLOL TARTRATE 25 MG TABS (METOPROLOL TARTRATE) 1 two times a day  #60 x 11   Entered by:   Scherrie Bateman, LPN   Authorized by:   Ferman Hamming, MD, [facility]   Signed by:   Scherrie Bateman, LPN on 74/25/9563   Method used:   [facility] to        [facility]  [id number]       [address]       [place], [place]   [address]       Ph: [phone] or [phone]       Fax: [phone]   RxID:   [id number]

## 2010-05-14 NOTE — Assessment & Plan Note (Signed)
Summary: FOLLOW UP/YF    History of Present Illness Visit Type: Follow-up Visit Primary GI MD: Sheryn Bison MD FACP FAGA Primary Provider: Alroy Dust, MD Chief Complaint: Bloating, gas, abdomen rumbling History of Present Illness:   Angela [name] continues with gas and bloating no other real symptoms. Treatment this past summer for possible bacterial overgrowth syndrome was unsuccessful. She does have known lactose intolerance and chronic IBS.   GI Review of Systems    Reports acid reflux, bloating, heartburn, and  nausea.      Denies abdominal pain, belching, chest pain, dysphagia with liquids, dysphagia with solids, loss of appetite, vomiting, vomiting blood, weight loss, and  weight gain.      Reports constipation, diarrhea, and  irritable bowel syndrome.     Denies anal fissure, black tarry stools, change in bowel habit, diverticulosis, fecal incontinence, heme positive stool, hemorrhoids, jaundice, light color stool, liver problems, rectal bleeding, and  rectal pain.    Current Medications (verified): 1)  Nasonex 50 Mcg/act Susp (Mometasone Furoate) .... 2 Sprays in Each Nostril Daily 2)  Hydrochlorothiazide 25 Mg Tabs (Hydrochlorothiazide) .... Take 1 Tablet By Mouth Once A Day 3)  Klor-Con M20 20 Meq Tbcr (Potassium Chloride Crys Cr) .... Take 1 Tablet By Mouth Once A Day 4)  Simvastatin 20 Mg  Tabs (Simvastatin) .... Take One Tablet By Mouth At Bedtime 5)  Synthroid 75 Mcg  Tabs (Levothyroxine Sodium) .Marland Kitchen.. 1 Tab Daily.Marland KitchenMarland Kitchen 6)  Nexium 40 Mg Cpdr (Esomeprazole Magnesium) .... Take 1 Capsule By Mouth Once A Day Jarold Motto Holding) 7)  Carafate 1 Gm/5ml  Susp (Sucralfate) .... Take 10 Cc By Mouth As Needed 8)  Levsin 0.125 Mg  Tabs (Hyoscyamine Sulfate) .... Take One By Mouth Every 4 Hours As Needed 9)  Pancreaze [address] Unit Cpep (Pancrelipase (Lip-Prot-Amyl)) .... Three Times A Day 10)  Align   Caps (Misc Intestinal Flora Regulat) .... Take One Capsule By Mouth Daily 11)  Premarin  0.625 Mg  Tabs (Estrogens Conjugated) .... Take 1 Tablet By Mouth Once A Day 12)  Celebrex 200 Mg Caps (Celecoxib) .Marland Kitchen.. 1 Tab By Mouth Once Daily As Needed For Arthritis Pain... 13)  Propoxyphene N-Apap 100-650 Mg  Tabs (Propoxyphene N-Apap) .Marland Kitchen.. 1 Tab By Mouth Q6-8h As Needed For Pain... 14)  Midrin 325-65-100 Mg  Caps (Apap-Isometheptene-Dichloral) .... Use As Needed For Headaches 15)  Lidoderm 5 % Ptch (Lidocaine) .... Use As Needed 16)  Skelaxin 800 Mg  Tabs (Metaxalone) .... Take 1 Tablet By Mouth Two Times A Day As Needed For Muscle Spasm... 17)  Vitamin D [address]454 Unit  Caps (Ergocalciferol) .... Take One Tablet By Mouth Once A Week  Allergies (verified): 1)  ! Tetracycline 2)  ! Codeine  Past History:  Past medical, surgical, family and social histories (including risk factors) reviewed for relevance to current acute and chronic problems.  Past Medical History: Reviewed history from 12/15/2008 and no changes required. HYPERTENSION (ICD-401.9) CHEST PAIN, ATYPICAL (ICD-786.59) CAROTID BRUIT (ICD-785.9) HYPERCHOLESTEROLEMIA (ICD-272.0) HIATAL HERNIA (ICD-553.3) GERD (ICD-530.81) DIVERTICULAR DISEASE (ICD-562.10) IRRITABLE BOWEL SYNDROME, HX OF (ICD-V12.79) COLONIC POLYPS (ICD-211.3) OSTEOARTHRITIS (ICD-715.90) FIBROMYALGIA (ICD-729.1) MIGRAINE HEADACHE (ICD-346.90) ANXIETY (ICD-300.00) ACNE ROSACEA (ICD-695.3)     Past Surgical History: Cholecystectomy Hysterectomy C-section  Family History: Reviewed history from 04/06/2008 and no changes required. Mother deceased at age 61 due to heart problems Father alive at age 11 with significat medical history of hypertension Sister alive at age 72, with significant medical history of hypertension, and DM Second sister alive at age  53 with significant medical history of hypertension Family History of Colon Cancer: Maternal Aunt Family History of Stomach Cancer: Maternal Aunt Family History of Colon Polyps: Father,  Sister  Social History: Reviewed history from 04/06/2008 and no changes required. Patient is a retired Production designer, theatre/television/film Patient states never smoked Patient is married with 2 children Alcohol Use - no Patient does not get regular exercise.   Review of Systems       The patient complains of allergy/sinus, arthritis/joint pain, back pain, breast changes/lumps, fatigue, headaches-new, muscle pains/cramps, night sweats, nosebleeds, and urination - excessive.  The patient denies anemia, anxiety-new, blood in urine, change in vision, confusion, cough, coughing up blood, depression-new, fainting, fever, hearing problems, heart murmur, heart rhythm changes, itching, menstrual pain, pregnancy symptoms, shortness of breath, skin rash, sleeping problems, sore throat, swelling of feet/legs, swollen lymph glands, thirst - excessive , urination - excessive , urination changes/pain, urine leakage, vision changes, and voice change.    Vital Signs:  Patient profile:   65 year old female Height:      62 inches Weight:      157.13 pounds BMI:     28.84 Pulse rate:   64 / minute Pulse rhythm:   regular BP sitting:   110 / 70  (left arm) Cuff size:   regular  Vitals Entered By: June McMurray CMA Duncan Dull) (May 01, 2009 4:18 PM)  Physical Exam  General:  Well developed, well nourished, no acute distress.healthy appearing.   Head:  Normocephalic and atraumatic. Eyes:  PERRLA, no icterus.exam deferred to patient's ophthalmologist.   Abdomen:  Soft, nontender and nondistended. No masses, hepatosplenomegaly or hernias noted. Normal bowel sounds.There is no abdominal distention and bowel sounds are normal. Extremities:  No clubbing, cyanosis, edema or deformities noted. Neurologic:  Alert and  oriented x4;  grossly normal neurologically. Psych:  Alert and cooperative. Normal mood and affect.   Impression & Recommendations:  Problem # 1:  ABDOMINAL PAIN, GENERALIZED (ICD-789.07) Assessment Improved She  has chronic IBS with gas and bloating. She has had extensive GI evaluations which have been negative. There is some present she may have occult chronic pancreatitis. Actually, the patient is doing as well as I have seen her in many years in terms of her symptoms. He'll continue all meds and try Librax t.i.d. before meals. I discussed irritable bowel syndrome with her and her lactose intolerance. I see no need for further testing at this time.  Problem # 2:  HYPERTENSION (ICD-401.9) Assessment: Improved blood pressure today is normal at 110/70. Suggest continue all of her other medications per Dr. Kriste Basque.  Problem # 3:  GERD (ICD-530.81) Assessment: Improved Continue reflux name and PPI therapy.  Problem # 4:  IRRITABLE BOWEL SYNDROME, HX OF (ICD-V12.79) Assessment: Improved  Patient Instructions: 1)  Copy sent to : Dr. Alroy Dust 2)  Please continue current medications.  3)  Hold Levsin and use t.i.d. Librax 4)  Please schedule a follow-up appointment as needed.   Appended Document: FOLLOW UP/YF    Clinical Lists Changes  Medications: Added new medication of LIBRAX 2.5-5 MG CAPS (CLIDINIUM-CHLORDIAZEPOXIDE) 1 by mouth three times a day prn - Signed Rx of LIBRAX 2.5-5 MG CAPS (CLIDINIUM-CHLORDIAZEPOXIDE) 1 by mouth three times a day prn;  #40 x 3;  Signed;  Entered by: Ashok Cordia RN;  Authorized by: Mardella Layman MD [facility];  Method used: [facility]  [address] [id number] [address] [place], [place]  [address], Ph: [phone] or [phone],  Fax: [phone]    Prescriptions: LIBRAX 2.5-5 MG CAPS (CLIDINIUM-CHLORDIAZEPOXIDE) 1 by mouth three times a day prn  #40 x 3   Entered by:   Ashok Cordia RN   Authorized by:   Mardella Layman MD [facility]   Signed by:   Ashok Cordia RN on 05/01/2009   Method used:   [facility]        [facility]  [address] [id number]       [address]       [place], [place]  [address]        Ph: [phone] or [phone]       Fax: [phone]   RxID:   [id number]

## 2010-05-14 NOTE — Letter (Signed)
Summary: [facility]  [facility]   Imported By: Lester Shelby 12/19/2009 09:31:32  _____________________________________________________________________  External Attachment:    Type:   Image     Comment:   External Document

## 2010-05-14 NOTE — Procedures (Signed)
Summary: Colonoscopy  Patient: Natalina Wieting Note: All result statuses are Final unless otherwise noted.  Tests: (1) Colonoscopy (COL)   COL Colonoscopy           DONE     [facility]     [address]     [place], [place]  [address]           COLONOSCOPY PROCEDURE REPORT           PATIENT:  Lexa, Coronado  MR#:  [id number]     BIRTHDATE:  October 22, 1945, 63 yrs. old  GENDER:  female           ENDOSCOPIST:  [name]. Jarold Motto, MD, [facility]     Referred by:           PROCEDURE DATE:  05/30/2009     PROCEDURE:  Colonoscopy with biopsy, Colonoscopy with hot biopsy     ASA CLASS:  Class III     INDICATIONS:  history of polyps           MEDICATIONS:   Fentanyl 50 mcg IV, Versed 6 mg IV           DESCRIPTION OF PROCEDURE:   After the risks benefits and     alternatives of the procedure were thoroughly explained, informed     consent was obtained.  Digital rectal exam was performed and     revealed no abnormalities.   The LB [id number] endoscope     was introduced through the anus and advanced to the cecum, which     was identified by both the appendix and ileocecal valve, without     limitations.  The quality of the prep was excellent, using     MoviPrep.  The instrument was then slowly withdrawn as the colon     was fully examined.     <<PROCEDUREIMAGES>>           FINDINGS:  Severe diverticulosis was found throughout the colon.     diffuse pancolonic diveerticulosis  A sessile polyp was found. 4mm     polyp hot biopsy removed,,,   Retroflexed views in the rectum     revealed no abnormalities.    The scope was then withdrawn from     the patient and the procedure completed.           COMPLICATIONS:  None           ENDOSCOPIC IMPRESSION:     1) Severe diverticulosis throughout the colon     2) Sessile polyp     r/o adenoma.chronic IBS and pancolonic diverticulosis.     RECOMMENDATIONS:     1) Await biopsy results     2) High fiber diet.     3) Repeat Colonoscopy  in 5 years.           REPEAT EXAM:  No           ______________________________     [name]. Jarold Motto, MD, [facility]           CC:  Michele Mcalpine, MD           n.     Rosalie DoctorMarland Kitchen   [name]. Gerhard Rappaport at 05/30/2009 09:07 AM           Starla Link, [id number]  Note: An exclamation mark (!) indicates a result that was not dispersed into the flowsheet. Document Creation Date: 05/30/2009 9:07 AM _______________________________________________________________________  (1) Order result status: Final Collection or observation  date-time: 05/30/2009 08:59 Requested date-time:  Receipt date-time:  Reported date-time:  Referring Physician:   Ordering Physician: Sheryn Bison [phone]) Specimen Source:  Source: Launa Grill Order Number: [id number] Lab site:   Appended Document: Colonoscopy     Procedures Next Due Date:    Colonoscopy: 06/2014  Appended Document: Colonoscopy 5y f/u

## 2010-05-14 NOTE — Miscellaneous (Signed)
Summary: [facility]  Clinical Lists Changes  Observations: Added new observation of ALLERGY REV: Done (12/21/2009 10:38)

## 2010-05-14 NOTE — Progress Notes (Signed)
Summary: continue taking metoprolol? > yes per SN  Phone Note Call from Patient Call back at [phone]   Caller: Patient Call For: nadel Summary of Call: pt want to no if she should continue taking metoprolol tar TR 25MG  [facility] [address] Initial call taken by: Rickard Patience,  January 04, 2010 8:28 AM  Follow-up for Phone Call        called spoke with patient who states she went to the ED Labor Day weekend for CP and was started on metoprolol 25mg  1/2 tab twice daily.  saw SN for a follow up on 9.8.11 but states she forgot to mention that med.  saw cards on 9.12.11 and Dr. Jens Som added med to pt's med list.  Dr. Kriste Basque, pt states she was not given any refills on the metoprolol and would like to know if you would like her to continue taking it and if so, would like refills.  please advise, thanks! Follow-up by: Boone Master CNA/MA,  January 04, 2010 9:13 AM  Additional Follow-up for Phone Call Additional follow up Details #1::        per SN----yes Dr. Jens Som wanted her to cont this low dose---metoprolol er 25mg    1/2 tab by mouth twice daily #30  refills x 6.  thanks Randell Loop CMA  January 04, 2010 9:42 AM     Prescriptions: METOPROLOL TARTRATE 25 MG TABS (METOPROLOL TARTRATE) 1/2 tab by mouth two times a day  #30 x 6   Entered by:   Boone Master CNA/MA   Authorized by:   Michele Mcalpine MD   Signed by:   Boone Master CNA/MA on 01/04/2010   Method used:   [facility]  [address] [id number]       [address]       [place], [place]  [address]       Ph: [phone] or [phone]       Fax: [phone]   RxID:   [id number]

## 2010-05-14 NOTE — Progress Notes (Signed)
Summary: Medication refill  Medications Added NEXIUM 40 MG CPDR (ESOMEPRAZOLE MAGNESIUM) Take 1 capsule by mouth once a day       Phone Note Call from Patient Call back at Home Phone [phone] Call back at [phone]   Caller: Patient Call For: Dr Jarold Motto Reason for Call: Refill Medication Summary of Call: Pt said the pharmacy does not have her Nexium.  Can you check if prior auth is needed and let pt know.   Initial call taken by: Misty Stanley,  December 07, 2009 4:07 PM  Follow-up for Phone Call        Pt states pharmacy did not receive the nexium rx that was sent on 12/05/09.  Rx resent. Follow-up by: Ashok Cordia RN,  December 07, 2009 4:18 PM    New/Updated Medications: NEXIUM 40 MG CPDR (ESOMEPRAZOLE MAGNESIUM) Take 1 capsule by mouth once a day Prescriptions: NEXIUM 40 MG CPDR (ESOMEPRAZOLE MAGNESIUM) Take 1 capsule by mouth once a day  #30 x 5   Entered by:   Ashok Cordia RN   Authorized by:   Mardella Layman MD [facility]   Signed by:   Ashok Cordia RN on 12/07/2009   Method used:   [facility] to ...       [facility]  [address] [id number]       [address]       [place], [place]  [address]       Ph: [phone] or [phone]       Fax: [phone]   RxID:   [id number] NEXIUM 40 MG CPDR (ESOMEPRAZOLE MAGNESIUM) Take 1 capsule by mouth once a day  #30 x 5   Entered by:   Ashok Cordia RN   Authorized by:   Mardella Layman MD [facility]   Signed by:   Ashok Cordia RN on 12/07/2009   Method used:   [facility]        [facility]  [address] [id number]       [address]       [place], [place]  [address]       Ph: [phone] or [phone]       Fax: [phone]   RxID:   [id number]

## 2010-05-14 NOTE — Progress Notes (Signed)
Summary: ? 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  Coming Tues 05/29/09 for TB test, will bring papers by to be reviewed and see if we can fille them out.  Follow-up by: Ashok Cordia RN,  May 23, 2009 11:53 AM

## 2010-05-14 NOTE — Miscellaneous (Signed)
Summary: Nexium Approval   Clinical Lists Changes [id number] Member Number: [id number] Case Type: Initial Review Case Start Date: 12/10/2009 Case Status: Coverage has been APPROVED. You will receive a confirmation letter confirming approval of this medication. The patient will also be notified of this approval via an automated outbound phone call or a letter. Please allow approximately 2 hours to update our system with the approval. Once updated, the prescription can be re-submitted.   Coverage Start Date: 11/19/2009 Coverage End Date: 12/10/2010  Patient First Name: Angela [name] Patient Last Name: MALSON DOB: 04-29-1945 Patient Street Address: [address]   Patient City: [place] Patient State:  Patient Zip: [phone]  Drug Name & Strength: Nexium 40 Mg

## 2010-05-14 NOTE — Letter (Signed)
Summary: EGD Instructions  [facility]  [address] [place], [place] [address]   Phone: [phone]  Fax: [phone]       AINO HECKERT    1946-03-14    MRN: [id number]       Procedure Day Dorna Bloom:  [facility]  12/26/09     Arrival Time: 10:00AM     Procedure Time:  11:00AM     Location of Procedure:                    _ X _ [facility] (4th Floor)   PREPARATION FOR ENDOSCOPY   On 12/26/09 THE DAY OF THE PROCEDURE:  1.   No solid foods, milk or milk products are allowed after midnight the night before your procedure.  2.   Do not drink anything colored red or purple.  Avoid juices with pulp.  No orange juice.  3.  You may drink clear liquids until 9:00AM, which is 2 hours before your procedure.                                                                                                CLEAR LIQUIDS INCLUDE: Water Jello Ice Popsicles Tea (sugar ok, no milk/cream) Powdered fruit flavored drinks Coffee (sugar ok, no milk/cream) Gatorade Juice: apple, white grape, white cranberry  Lemonade Clear bullion, consomm, broth Carbonated beverages (any kind) Strained chicken noodle soup Hard Candy   MEDICATION INSTRUCTIONS  Unless otherwise instructed, you should take regular prescription medications with a small sip of water as early as possible the morning of your procedure.  Additional medication instructions:  DO NOT TAKE HCTZ ON DAY OF PROCEDURE             OTHER INSTRUCTIONS  You will need a responsible adult at least 64 years of age to accompany you and drive you home.   This person must remain in the waiting room during your procedure.  Wear loose fitting clothing that is easily removed.  Leave jewelry and other valuables at home.  However, you may wish to bring a book to read or an iPod/MP3 player to listen to music as you wait for your procedure to start.  Remove all body piercing jewelry and leave at home.  Total time  from sign-in until discharge is approximately 2-3 hours.  You should go home directly after your procedure and rest.  You can resume normal activities the day after your procedure.  The day of your procedure you should not:   Drive   Make legal decisions   Operate machinery   Drink alcohol   Return to work  You will receive specific instructions about eating, activities and medications before you leave.    The above instructions have been reviewed and explained to me by   Durwin Glaze RN  December 21, 2009 11:08 AM     I fully understand and can verbalize these instructions _____________________________ Date _________

## 2010-05-14 NOTE — Procedures (Signed)
Summary: Summary Report  Summary Report   Imported By: Erle Crocker 03/22/2010 13:23:02  _____________________________________________________________________  External Attachment:    Type:   Image     Comment:   External Document

## 2010-05-14 NOTE — Assessment & Plan Note (Signed)
Summary: TB test for job requirement/dfs  Nurse Visit   Allergies: 1)  ! Tetracycline 2)  ! Codeine  Immunizations Administered:  PPD Skin Test:    Vaccine Type: PPD    Site: left forearm    Mfr: Sanofi Pasteur    Dose: 0.1 ml    Route: ID    Given by: Harlow Mares CMA (AAMA)    Exp. Date: 09/09/2011    Lot #: [id number]  PPD Results    Date of reading: 05/31/2009    Results: < 5mm    Interpretation: negative  Orders Added: 1)  TB Skin Test [86580] 2)  Admin 1st Vaccine [45409]

## 2010-05-14 NOTE — Assessment & Plan Note (Signed)
Summary: upper gastric pain--ch.    History of Present Illness Visit Type: Follow-up Visit Primary GI MD: Sheryn Bison MD FACP FAGA Primary Provider: Alroy Dust, MD Chief Complaint: Abdominal Discomfort History of Present Illness:   New Onset left upper quadrant pain radiating to her back with associated nausea but no vomiting. She has chronic GERD and is on regular Nexium, Carafate, and p.r.n. Levsin. She has a history of idiopathic pancreatitis and takes pancreatic extract daily. There is no history of ethanol abuse, and she is status post cholecystectomy. She denies lower gastrointestinal were specific hepatobiliary complaints.   GI Review of Systems    Reports abdominal pain, acid reflux, belching, bloating, and  heartburn.      Denies chest pain, dysphagia with liquids, dysphagia with solids, loss of appetite, nausea, vomiting, vomiting blood, weight loss, and  weight gain.      Reports irritable bowel syndrome.     Denies anal fissure, black tarry stools, change in bowel habit, constipation, diarrhea, diverticulosis, fecal incontinence, heme positive stool, hemorrhoids, jaundice, light color stool, liver problems, rectal bleeding, and  rectal pain.    Current Medications (verified): 1)  Nasonex 50 Mcg/act Susp (Mometasone Furoate) .... 2 Sprays in Each Nostril Daily 2)  Hydrochlorothiazide 25 Mg Tabs (Hydrochlorothiazide) .... Take 1 Tablet By Mouth Once A Day 3)  Klor-Con M20 20 Meq Tbcr (Potassium Chloride Crys Cr) .... Take 1 Tablet By Mouth Once A Day 4)  Simvastatin 20 Mg  Tabs (Simvastatin) .... Take One Tablet By Mouth At Bedtime 5)  Synthroid 75 Mcg  Tabs (Levothyroxine Sodium) .Marland Kitchen.. 1 Tab Daily.Marland KitchenMarland Kitchen 6)  Nexium 40 Mg Cpdr (Esomeprazole Magnesium) .... Take 1 Capsule By Mouth Once A Day Jarold Motto Holding) 7)  Carafate 1 Gm/74ml  Susp (Sucralfate) .... Take 10 Cc By Mouth As Needed 8)  Levsin 0.125 Mg  Tabs (Hyoscyamine Sulfate) .... Take One By Mouth Every 4 Hours As  Needed 9)  Librax 2.5-5 Mg Caps (Clidinium-Chlordiazepoxide) .Marland Kitchen.. 1 By Mouth Three Times A Day Prn 10)  Pancreaze 45409 Unit Cpep (Pancrelipase (Lip-Prot-Amyl)) .... Three Times A Day 11)  Align   Caps (Misc Intestinal Flora Regulat) .... Take One Capsule By Mouth Daily 12)  Premarin 0.625 Mg  Tabs (Estrogens Conjugated) .... Take 1 Tablet By Mouth Once A Day 13)  Celebrex 200 Mg Caps (Celecoxib) .Marland Kitchen.. 1 Tab By Mouth Once Daily As Needed For Arthritis Pain... 14)  Midrin 325-65-100 Mg  Caps (Apap-Isometheptene-Dichloral) .... Use As Needed For Headaches 15)  Lidoderm 5 % Ptch (Lidocaine) .... Use As Needed 16)  Skelaxin 800 Mg  Tabs (Metaxalone) .... Take 1 Tablet By Mouth Two Times A Day As Needed For Muscle Spasm... 17)  Vitamin D 81191 Unit  Caps (Ergocalciferol) .... Take One Tablet By Mouth Once A Week  Allergies (verified): 1)  ! Tetracycline 2)  ! Codeine  Past History:  Family History: Last updated: 04/15/08 Mother deceased at age 53 due to heart problems Father alive at age 61 with significat medical history of hypertension Sister alive at age 1, with significant medical history of hypertension, and DM Second sister alive at age 57 with significant medical history of hypertension Family History of Colon Cancer: Maternal Aunt Family History of Stomach Cancer: Maternal Aunt Family History of Colon Polyps: Father, Sister  Social History: Last updated: 04/15/08 Patient is a retired Production designer, theatre/television/film Patient states never smoked Patient is married with 2 children Alcohol Use - no Patient does not get regular exercise.   Past  medical, surgical, family and social histories (including risk factors) reviewed for relevance to current acute and chronic problems.  Past Medical History: Reviewed history from 07/20/2009 and no changes required.  HYPERTENSION (ICD-401.9) CHEST PAIN, ATYPICAL (ICD-786.59) CAROTID BRUIT (ICD-785.9) HYPERCHOLESTEROLEMIA (ICD-272.0) HYPOTHYROIDISM  (ICD-244.9) HIATAL HERNIA (ICD-553.3) GERD (ICD-530.81) DIVERTICULAR DISEASE (ICD-562.10) IRRITABLE BOWEL SYNDROME, HX OF (ICD-V12.79) COLONIC POLYPS (ICD-211.3) OSTEOARTHRITIS (ICD-715.90) FIBROMYALGIA (ICD-729.1) VITAMIN D DEFICIENCY (ICD-268.9) MIGRAINE HEADACHE (ICD-346.90) ANXIETY (ICD-300.00) ACNE ROSACEA (ICD-695.3)  Past Surgical History: Reviewed history from 07/20/2009 and no changes required. Cholecystectomy Hysterectomy C-section  Family History: Reviewed history from 04/06/2008 and no changes required. Mother deceased at age 12 due to heart problems Father alive at age 9 with significat medical history of hypertension Sister alive at age 36, with significant medical history of hypertension, and DM Second sister alive at age 23 with significant medical history of hypertension Family History of Colon Cancer: Maternal Aunt Family History of Stomach Cancer: Maternal Aunt Family History of Colon Polyps: Father, Sister  Social History: Reviewed history from 04/06/2008 and no changes required. Patient is a retired Production designer, theatre/television/film Patient states never smoked Patient is married with 2 children Alcohol Use - no Patient does not get regular exercise.   Review of Systems  The patient denies allergy/sinus, anemia, anxiety-new, arthritis/joint pain, back pain, blood in urine, breast changes/lumps, change in vision, confusion, cough, coughing up blood, depression-new, fainting, fatigue, fever, headaches-new, hearing problems, heart murmur, heart rhythm changes, itching, menstrual pain, muscle pains/cramps, night sweats, nosebleeds, pregnancy symptoms, shortness of breath, skin rash, sleeping problems, sore throat, swelling of feet/legs, swollen lymph glands, thirst - excessive , urination - excessive , urination changes/pain, urine leakage, vision changes, and voice change.    Vital Signs:  Patient profile:   65 year old female Height:      62 inches Weight:      159  pounds BMI:     29.19 BSA:     1.74 Pulse rate:   80 / minute Pulse rhythm:   regular BP sitting:   108 / 70  (left arm)  Vitals Entered By: Merri Ray CMA Duncan Dull) (December 07, 2009 10:38 AM)  Physical Exam  General:  Well developed, well nourished, no acute distress.healthy appearing.   Head:  Normocephalic and atraumatic. Eyes:  PERRLA, no icterus.exam deferred to patient's ophthalmologist.   Lungs:  Clear throughout to auscultation. Heart:  Regular rate and rhythm; no murmurs, rubs,  or bruits. Abdomen:  Soft, nontender and nondistended. No masses, hepatosplenomegaly or hernias noted. Normal bowel sounds.Active nonobstructive bowel sounds. Msk:  Symmetrical with no gross deformities. Normal posture. Neurologic:  Alert and  oriented x4;  grossly normal neurologically. Skin:  Intact without significant lesions or rashes. Psych:  Alert and cooperative. Normal mood and affect.anxious.     Impression & Recommendations:  Problem # 1:  ABDOMINAL PAIN, LEFT UPPER QUADRANT (ICD-789.02) Assessment Deteriorated Rule Out relapse of her chronic pancreatitis versus flare of IBS. She does have a history of atypical chest pain, IBS, and fibromyalgia. I've ordered labs and CT scan of the abdomen and we'll treat her with p.r.n. 5 mg of hydrocodone pending evaluation. She is to continue all her other gastrointestinal medications as listed and reviewed in her record. Orders: TLB-CBC Platelet - w/Differential (85025-CBCD) TLB-BMP (Basic Metabolic Panel-BMET) (80048-METABOL) TLB-Hepatic/Liver Function Pnl (80076-HEPATIC) TLB-TSH (Thyroid Stimulating Hormone) (84443-TSH) TLB-B12, Serum-Total ONLY ([address]-U04) TLB-Ferritin (82728-FER) TLB-Folic Acid (Folate) (82746-FOL) TLB-IBC Pnl (Iron/FE;Transferrin) (83550-IBC) TLB-Lipase (83690-LIPASE) TLB-Sedimentation Rate (ESR) (85652-ESR) T-Trypsinogen Serum (54098-11914) T- * Misc. Laboratory test (  60454)  Problem # 2:  EXCESSIVE BELCHING  (ICD-787.3) Assessment: Unchanged  Problem # 3:  MYALGIA (ICD-729.1) Assessment: Unchanged  Problem # 4:  GERD (ICD-530.81) Assessment: Improved Continue daily PPI p.r.n. Carafate. She may need followup endoscopy exam from 2009. Orders: TLB-CBC Platelet - w/Differential (85025-CBCD) TLB-BMP (Basic Metabolic Panel-BMET) (80048-METABOL) TLB-Hepatic/Liver Function Pnl (80076-HEPATIC) TLB-TSH (Thyroid Stimulating Hormone) (84443-TSH) TLB-B12, Serum-Total ONLY (09811-B14) TLB-Ferritin (82728-FER) TLB-Folic Acid (Folate) (82746-FOL) TLB-IBC Pnl (Iron/FE;Transferrin) (83550-IBC) TLB-Lipase (83690-LIPASE) TLB-Sedimentation Rate (ESR) (85652-ESR) T-Trypsinogen Serum (78295-62130) T- * Misc. Laboratory test [phone])  Patient Instructions: 1)  Please go to the basement for lab work. 2)  You are scheduled for a CT scan. 3)  Rx given for pain medication. 4)  Copy sent to : Dr. Alroy Dust. 5)  Please continue current medications.  Prescriptions: HYDROCODONE-ACETAMINOPHEN 5-500 MG TABS (HYDROCODONE-ACETAMINOPHEN) 1 tab q 4-6 hrs as needed for pain  #50 x 0   Entered by:   Ashok Cordia RN   Authorized by:   Mardella Layman MD [facility]   Signed by:   Ashok Cordia RN on 12/07/2009   Method used:   Print then Give to Patient   RxID:   [id number]   Appended Document: upper gastric pain--ch.    Clinical Lists Changes  Orders: Added new Referral order of CT Abdomen/Pelvis with Contrast (CT Abd/Pelvis w/con) - Signed

## 2010-05-14 NOTE — Letter (Signed)
Summary: Colonoscopy Letter  [facility]  [address] [place], [place] [address]   Phone: [phone]  Fax: [phone]      April 20, 2009 MRN: [id number]   [facility] [address] [place], [place]  [address]   Dear Angela [name],   According to your medical record, it is time for you to schedule a Colonoscopy. The American Cancer Society recommends this procedure as a method to detect early colon cancer. Patients with a family history of colon cancer, or a personal history of colon polyps or inflammatory bowel disease are at increased risk.  This letter has beeen generated based on the recommendations made at the time of your procedure. If you feel that in your particular situation this may no longer apply, please contact our office.  Please call our office at [phone] to schedule this appointment or to update your records at your earliest convenience.  Thank you for cooperating with [place] to provide you with the very best care possible.   Sincerely,  [name]. Jarold Motto, M.D.  [facility] [phone]

## 2010-05-14 NOTE — Procedures (Signed)
Summary: esophageal manometry    Esophageal Manometry  Procedure date:  01/07/2010  Findings:      normal:     Esophageal Manometry  Procedure date:  01/07/2010  Findings:      normal:   UES...normal LES.Marland Kitchennormal..mean pressure 27mm Hg and normal relaxation Normal motility...mean amplitude Hg,normal peristalsis...  NORMAL ESOPHAGEAL MANOMETRY EXAM...DRP  Appended Document: esophageal manometry billed

## 2010-05-14 NOTE — Progress Notes (Signed)
Summary: red bumps in knee  Phone Note Call from Patient   Summary of Call: patient calling answering service: States something bit her yesterday. Started itching last night. Today red bumps x 2 around both knees. 1-2 inches apart on both knees. Each is size of head of ink pen. Itching. Making legs sore. No other redness. No fever. PMHX significnat for IBS, Fibromyalgia, GERD. Advised topical steroids OTC x 1 tonight. Call office in morning and see Tammy 7/18  NOTe to triage: call and give appt to see Tammy P on 7/18 Initial call taken by: Kalman Shan MD,  October 28, 2009 9:18 PM  Follow-up for Phone Call        [facility] Vernie Murders  October 29, 2009 8:56 AM  Spoke with pt.  She states that the bumps on her legs are getting better taking benadryl.  She states that she is going to pick up some hydrocortisone cream today.  Feels like since it is improving she does not need to come in for appt.  I advised that she just call us for appt if symptoms persist or worsen for appt.  Follow-up by: Vernie Murders,  October 30, 2009 11:34 AM

## 2010-05-14 NOTE — Progress Notes (Signed)
Summary: [facility] form/ refills  Phone Note Call from Patient Call back at [facility] Phone [phone]   Caller: Patient Call For: nadel Summary of Call: pt dropped off [facility] refill form. melanie has given this to leigh.  Initial call taken by: Tivis Ringer,  May 01, 2009 5:02 PM    Prescriptions: SYNTHROID 75 MCG  TABS (LEVOTHYROXINE SODIUM) 1 tab daily...  #90 x 3   Entered by:   Randell Loop CMA   Authorized by:   Michele Mcalpine MD   Signed by:   Randell Loop CMA on 05/01/2009   Method used:   Faxed to ...       [facility]* (mail-order)             ,          Ph: [phone]       Fax: [phone]   RxID:   [id number] SYNTHROID 75 MCG  TABS (LEVOTHYROXINE SODIUM) 1 tab daily...  #90 x 3   Entered by:   Randell Loop CMA   Authorized by:   Michele Mcalpine MD   Signed by:   Randell Loop CMA on 05/01/2009   Method used:   Electronically to        [facility]* (mail-order)             ,          Ph: [phone]       Fax: [phone]   RxID:   [id number]  rx had to be resent due to it being sent electronically the first time.   Randell Loop CMA  May 01, 2009 5:07 PM

## 2010-05-14 NOTE — Medication Information (Signed)
Summary: Approval/[facility]  Approval/[facility]   Imported By: Lester Scribner 12/18/2009 11:52:22  _____________________________________________________________________  External Attachment:    Type:   Image     Comment:   External Document

## 2010-05-14 NOTE — Assessment & Plan Note (Signed)
Summary: 6 months/apc   Primary Care Provider:  Alroy Dust, MD  CC:  7 month ROV & review of mult medical problems....  History of Present Illness: 65 y/o BF here for a follow up visit... she has mult medical problems as listed below...    ~  In Jan09 we noted a L>R CBruits... CDopplers showed no signif plaque but incr velocities w/ 40-59% bilat ICA stenoses... REC for ASA daily and f/u CDoppler in 25yr... she had a f/u appt w/ DrLewit for her HA's and he rec an MRI scan to eval fibromusc hyperplasia... done at [facility] & results indicate no plaque but incr velocities c/w FIBROMUSC HYPERPLASIA- he is following and plans repeat studies... of note she has mild HBP which is easily controlled on HCTZ...   ~  Nov09:  she saw DrDeveshwar for Rheum eval in 6/09- they discussed poss Lyrica/ Topamax/ Flexeril therapy (also rec Align for her IBS, Urology referral for her IC, supplements for her DJD, & lab work up for her fatigue)... she is taking the LYRICA 50mg Tid and sl improved... she has not been back to DrD and states that DrLewitt is following her Fibromyalgia...    ~  Aug 14, 2008:  mult somatic complaints and f/u issues- c/o urinary freq- check UA.      ** GI eval by DrPatterson 12/09 w/ IBS & abn CT Pelvis- ?diverticulitis w/ sm abscess... Rx'd w/ Cipro/ Flagyl and f/u colonoscopy 1/10 showed severe pan-diverticulosis (no inflamm)... symptoms resolved.      ** had right shoulder pain w/ injection by DrDalldorf- improved...      ** saw TP 3/10 w/ muscle aches on Simva20- labs w/ CPK, LFTs, sed all WNL.Marland Kitchen. held for 1 month & symptoms no better, therefore restarted.      ** had Mammogram 3/10 At [facility]- neg, f/u 88yr...       **noted melena w/ repeat GI eval 4/10 showing heme neg stools and Hg= 12.6...       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   ~  December 15, 2008:  recent sinus infection Rx w/  Augmentin/ Mucinex & slowly improving... she is due for fasting labs and will return next week for these to be drawn...      ** saw DrPatterson 6/10 for belching/ bloating/ discomfort c/w IBS- Rx w/ Xifaxan x10d, etc...      ** saw DrDaldorf 7/10 for shoulder & knee pains- given another right shoulder injection.      ** saw DrLewitt 7/10 for f/u fibromusc dysplasia of carotids- on ASA, doing well, he plans repeat MRI/MRA in 25yrs;  and f/u of her FM- going to Integrative therapies and doing water exercises...    ~  July 20, 2009:  she has had another eval from [facility] for her complaints of bloating, belching, gas> another colonoscoy done 2/11 w/ severe divertics, tiny sessile hyperpl polyp removed... she is taking Librax & Levsin as needed... she is looking forward to water exercises and returning to Integrative therapies for help w/ her FM... she requests fasting blood work> see problem list below.    Current Problem List:  HYPERTENSION (ICD-401.9) - controlled on HCT 25mg /d & K20/d... BP=114/76u today and doing well- denies HA, visual changes, CP, palipit, dizziness, syncope, dyspnea, edema, etc...  CHEST PAIN, ATYPICAL (ICD-786.59) - hx CWP x 25 yrs assoc w/ her FM... symptoms  flair on & off, but doing OK overall & no angina.  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 she is due f/u now.  HYPERCHOLESTEROLEMIA (ICD-272.0) - on SIMVASTATIN 20mg /d now...  ~  FLP 6/09 showed TChol 220, TG 88, HDL 67, LDL 138... rec- start Simva20, + diet/ exercise.  ~  FLP 03/13/08 showed TChol 188, TG 95, HDL, 80, LDL 89... looks great- same Rx.  ~  3/10 stopped Simva20 for 1 mo due to muscle symptoms- no change off med therefore restarted.  ~  FLP 9/10 showed TChol 160. 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 currently taking LEVOTHYROID Daily...  ~  labs 9/10 showed TSH= 0.19  ~  labs 4/11 showed TSH= 0.28  HIATAL HERNIA (ICD-553.3) & GERD (ICD-530.81) - Rx NEXIUM 40mg /d, CARAFATE Prn (which she takes for "real bad" indigestion), PANCREASE Tid, and now probiotics (ALIGN).Marland Kitchen.  ~  EGD by DrPatterson was 1/07 and showed 4cm HH, esophagitis, & duodenitis...   ~  repeat EGD 09/13/07 w/ 3cmHH, gastritis, stricture- dilated...  ~  treated 6/10 for gas/ bloating/ discomfort w/ Xifaxan but no better...  DIVERTICULAR DISEASE (ICD-562.10) IRRITABLE BOWEL SYNDROME, HX OF (ICD-V12.79) COLONIC POLYPS (ICD-211.3) -   ~  colonoscopy was 1/06 showing divertics and hems... f/u planned 5 yrs.  ~  12/09 had eval for abd pain & other symptoms- CT w/ ?sm abscess- ?diverticulits... f/u colonoscopy 1/10 by DrPatterson w/ severe pan-diverticulosis but no inflamm (resolved on Cipro/ Flagyl Rx)...  ~  2/11: another eval from DrPatterson for gas/ bloating/ etc> colonoscopy showed divertics & tiny hyperpl polyp removed...  OSTEOARTHRITIS (ICD-715.90) - sees DrDaldorf... hx bilat shoulder problems w/ shots & PT- see his notes. FIBROMYALGIA (ICD-729.1) - off LYRICA per DrLewitt due to blurry vision & 15# weight gain... takes SKELAXIN 800mg TidPrn, CELEBREX (ave 1 tab Qod), and Tylenol... she saw DrRowe yrs ago and she wanted a Rheum re-evaluation- she saw DrDeveshwar 6/09- see above... now she says DrLewitt is treating her FM.  VITAMIN D DEFICIENCY (ICD-268.9) - on Vit D 50000 u weekly...  ~  labs 6/09 showed Vit D level = 22... start 50K weekly...  ~  labs 11/09 showed Vit D level = 51... asked to switch to OTC Vit D supplement but she continued the perscription.  MIGRAINE HEADACHE (ICD-346.90) - uses MIDRIN, plus the above meds and is followed by DrLewitt... also saw him for dizziness and Dx w/ benign positional vertigo Rx w/ exercises...  ANXIETY (ICD-300.00)  ACNE ROSACEA (ICD-695.3)  Health Maintenance -  GYN= DrARoss (on Premarin) & he does PAP smears, Mammograms, & BMD's on her...   Allergies: 1)  ! Tetracycline 2)  ! Codeine  Comments:  Nurse/Medical Assistant: The patient's medications and allergies were reviewed with the patient and were updated in the Medication and Allergy Lists.  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 Mother deceased at age 12 due to heart  problems Father alive at age 37 with significat medical history of hypertension Sister alive at age 10, with significant medical history of hypertension, and DM Second sister alive at age 42 with significant medical history of hypertension Family History of Colon Cancer: Maternal Aunt Family History of Stomach Cancer: Maternal Aunt Family History of Colon Polyps: Father, Sister  Social History: Reviewed history from 04/06/2008 and no changes required. Patient is a retired Production designer, theatre/television/film Patient states never smoked Patient is married with 2 children Alcohol Use - no Patient does not get regular exercise.   Review of Systems      See HPI       The patient complains of chest pain, dyspnea on exertion, and headaches.  The patient denies anorexia, fever, weight loss, weight gain, vision loss, decreased hearing, hoarseness, syncope, peripheral edema, prolonged cough, hemoptysis, abdominal pain, melena, hematochezia, severe  indigestion/heartburn, hematuria, incontinence, muscle weakness, suspicious skin lesions, transient blindness, difficulty walking, depression, unusual weight change, abnormal bleeding, enlarged lymph nodes, and angioedema.    Vital Signs:  Patient profile:   65 year old female Height:      62 inches Weight:      161.25 pounds BMI:     29.60 O2 Sat:      99 % on Room air Temp:     97.7 degrees F oral Pulse rate:   83 / minute BP sitting:   114 / 76  (left arm) Cuff size:   regular  Vitals Entered By: Randell Loop CMA (July 20, 2009 12:03 PM)  O2 Sat at Rest %:  99 O2 Flow:  Room air CC: 7 month ROV & review of mult medical problems... Is Patient Diabetic? No Pain Assessment Patient in pain? no      Comments meds updated today   Physical Exam  Additional Exam:  WD, WN, 65 y/o BF in NAD...  GENERAL:  Alert & oriented; pleasant & cooperative... HEENT:  [place]/AT, EOM-full, PERRLA, EACs-clear, TMs-wnl, NOSE- rosacea rash,  THROAT-clear & wnl. NECK:  Supple w/ fair ROM; no JVD; sl decr carotid impulses w/ bilat L>R bruits ; no thyromegaly or nodules palpated; no lymphadenopathy. CHEST:  Clear to P & A; without wheezes/ rales/ or rhonchi heard... HEART:  Regular Rhythm; without murmurs/ rubs/ or gallops detected... ABDOMEN:  Soft & nontender; normal bowel sounds; no organomegaly or masses detected. EXT: without deformities or arthritic changes; no varicose veins/ venous insuffic/ or edema. NEURO:  no focal neuro deficits... DERM:  without lesions seen x acne.    MISC. Report  Procedure date:  07/20/2009  Findings:      Lipid Panel (LIPID)   Cholesterol               181 mg/dL                   2-952   Triglycerides             117.0 mg/dL                 8.4-132.4   HDL                       40.10 mg/dL                 >27.25   LDL Cholesterol           85 mg/dL  0-99   BMP (METABOL)   Sodium                    142 mEq/L                   135-145    Potassium                 3.6 mEq/L                   3.5-5.1   Chloride                  101 mEq/L                   96-112   Carbon Dioxide            31 mEq/L                    19-32   Glucose                   79 mg/dL                    16-10   BUN                       12 mg/dL                    9-60   Creatinine                0.9 mg/dL                   4.5-4.0   Calcium                   9.7 mg/dL                   9.8-11.9   GFR                       81.33 mL/min                >60   Hepatic/Liver Function Panel (HEPATIC)   Total Bilirubin           0.6 mg/dL                   1.4-7.8   Direct Bilirubin          0.1 mg/dL                   2.9-5.6   Alkaline Phosphatase      47 U/L                      39-117   AST                       21 U/L                      0-37   ALT                       20 U/L                      0-35   Total Protein  7.0 g/dL                    6.0-4.5   Albumin                   3.8 g/dL                    4.0-9.8  Comments:      CBC Platelet w/Diff (CBCD)   White Cell Count          6.8 K/uL                    4.5-10.5   Red Cell Count            4.15 Mil/uL                 3.87-5.11   Hemoglobin                12.8 g/dL                   11.9-14.7   Hematocrit                37.9 %                      36.0-46.0   MCV                       91.4 fl                     78.0-100.0   Platelet Count            349.0 K/uL                  150.0-400.0   Neutrophil %              43.8 %                      43.0-77.0   Lymphocyte %              44.2 %                      12.0-46.0   Monocyte %                9.1 %                       3.0-12.0   Eosinophils%              2.4 %                       0.0-5.0   Basophils %               0.5 %                       0.0-3.0   TSH (TSH)   FastTSH              [L]  0.28 uIU/mL                 0.35-5.50   Impression & Recommendations:  Problem # 1:  HYPERTENSION (ICD-401.9) Controlled on  simple med- continue same... no worsening BP etc (known fibromusc hyperplasia of carotids). Her updated medication list for this problem includes:  Hydrochlorothiazide 25 Mg Tabs (Hydrochlorothiazide) .Marland Kitchen... 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  Problem # 3:  HYPERCHOLESTEROLEMIA (ICD-272.0) She is doing satis on the Simva20... Her updated medication list for this problem includes:    Simvastatin 20 Mg Tabs (Simvastatin) .Marland Kitchen... Take one tablet by mouth at bedtime  Problem # 4:  GERD (ICD-530.81) C/o gas. bloating, indigestion, etc... continue Rx. Her updated medication list for this problem includes:    Nexium 40 Mg Cpdr (Esomeprazole magnesium) .Marland Kitchen... Take 1 capsule by mouth once a day (patterson holding)    Carafate 1 Gm/86ml Susp (Sucralfate) .Marland Kitchen... Take 10 cc by mouth as needed    Levsin 0.125 Mg Tabs (Hyoscyamine sulfate) .Marland Kitchen... Take one by mouth every 4 hours as needed    Librax 2.5-5 Mg Caps (Clidinium-chlordiazepoxide) .Marland Kitchen... 1 by mouth three times a day prn  Problem # 5:  IRRITABLE BOWEL SYNDROME, HX OF (ICD-V12.79) She is on both Levsin & Librax for Prn use...  Problem # 6:  HYPOTHYROIDISM (ICD-244.9) Continue same dose of Synthroid for now... Her updated medication list for this problem includes:    Synthroid 75 Mcg Tabs (Levothyroxine sodium) .Marland Kitchen... 1 tab daily...  Problem # 7:  FIBROMYALGIA (ICD-729.1) She will re-start water aerobics and integrative therapies. The following medications were removed from the medication list:    Propoxyphene N-apap 100-650 Mg Tabs (Propoxyphene n-apap) .Marland Kitchen... 1 tab by mouth q6-8h as needed for pain... Her updated medication  list for this problem includes:    Celebrex 200 Mg Caps (Celecoxib) .Marland Kitchen... 1 tab by mouth once daily as needed for arthritis pain...    Skelaxin 800 Mg Tabs (Metaxalone) .Marland Kitchen... Take 1 tablet by mouth two times a day as needed for muscle spasm...  Problem # 8:  OTHER MEDICAL PROBLEMS AS LISTED>>>  Complete Medication List: 1)  Nasonex 50 Mcg/act Susp (Mometasone furoate) .... 2 sprays in each nostril daily 2)  Hydrochlorothiazide 25 Mg Tabs (Hydrochlorothiazide) .... Take 1 tablet by mouth once a day 3)  Klor-con M20 20 Meq Tbcr (Potassium chloride crys cr) .... Take 1 tablet by mouth once a day 4)  Simvastatin 20 Mg Tabs (Simvastatin) .... Take one tablet by mouth at bedtime 5)  Synthroid 75 Mcg Tabs (Levothyroxine sodium) .Marland Kitchen.. 1 tab daily.Marland KitchenMarland Kitchen 6)  Nexium 40 Mg Cpdr (Esomeprazole magnesium) .... Take 1 capsule by mouth once a day (patterson holding) 7)  Carafate 1 Gm/29ml Susp (Sucralfate) .... Take 10 cc by mouth as needed 8)  Levsin 0.125 Mg Tabs (Hyoscyamine sulfate) .... Take one by mouth every 4 hours as needed 9)  Librax 2.5-5 Mg Caps (Clidinium-chlordiazepoxide) .Marland Kitchen.. 1 by mouth three times a day prn 10)  Pancreaze 29528 Unit Cpep (Pancrelipase (lip-prot-amyl)) .... Three times a day 11)  Align Caps (Misc intestinal flora regulat) .... Take one capsule by mouth daily 12)  Premarin 0.625 Mg Tabs (Estrogens conjugated) .... Take 1 tablet by mouth once a day 13)  Celebrex 200 Mg Caps (Celecoxib) .Marland Kitchen.. 1 tab by mouth once daily as needed for arthritis pain... 14)  Midrin 325-65-100 Mg Caps (Apap-isometheptene-dichloral) .... Use as needed for headaches 15)  Lidoderm 5 % Ptch (Lidocaine) .... Use as needed 16)  Skelaxin 800 Mg Tabs (Metaxalone) .... Take 1 tablet by mouth two times a day  as needed for muscle spasm... 17)  Vitamin D 16109 Unit Caps (Ergocalciferol) .... Take one tablet by mouth once a week  Patient Instructions: 1)  Today we updated your med list- see below.... 2)  Continue  your current medications the same... 3)  Have your Pharm contact us regarding any needed refills... 4)  Today we did your follow up FASTING blood work... please call the "phone tree" in a few days for your lab results.Marland KitchenMarland Kitchen 5)  Call for any problems.Marland KitchenMarland Kitchen 6)  Please schedule a follow-up appointment in 6 months.

## 2010-05-14 NOTE — Procedures (Signed)
Summary: Upper Endoscopy  Patient: [name] Note: All result statuses are Final unless otherwise noted.  Tests: (1) Upper Endoscopy (EGD)   EGD Upper Endoscopy       DONE     [facility]     [address]     [place], [place]  [address]           ENDOSCOPY PROCEDURE REPORT           PATIENT:  Angela [name], Angela [name]  MR#:  [id number]     BIRTHDATE:  1946-01-16, 64 yrs. old  GENDER:  female           ENDOSCOPIST:  [name]. Jarold Motto, MD, [facility]     Referred by:  Alroy Dust, M.D.           PROCEDURE DATE:  12/26/2009     PROCEDURE:  EGD with biopsy, Maloney Dilation of Esophagus     ASA CLASS:  Class II     INDICATIONS:  heartburn ATYPICAL CHEST PAIN,,,           MEDICATIONS:   Fentanyl 50 mcg IV, Versed 6 mg IV     TOPICAL ANESTHETIC:  Exactacain Spray           DESCRIPTION OF PROCEDURE:   After the risks benefits and     alternatives of the procedure were thoroughly explained, informed     consent was obtained.  The LB [id number]-H180 [id number] endoscope was     introduced through the mouth and advanced to the second portion of     the duodenum, without limitations.  The instrument was slowly     withdrawn as the mucosa was fully examined.     <<PROCEDUREIMAGES>>           A hiatal hernia was found. 3 CM. HH NOTED.NO STRICTURE     NOTED.DILATED #63F MALONEY DILATOR.RANDOM ESOPHAGEAL BIOPSIES     DONE.  The upper, middle, and distal third of the esophagus were     carefully inspected and no abnormalities were noted. The z-line     was well seen at the GEJ. The endoscope was pushed into the fundus     which was normal including a retroflexed view. The antrum,gastric     body, first and second part of the duodenum were unremarkable.     Retroflexed views revealed no abnormalities.    The scope was then     withdrawn from the patient and the procedure completed.           COMPLICATIONS:  None           ENDOSCOPIC IMPRESSION:     1) Hiatal hernia     2) Normal EGD     R/O  EOSINOPHILIC ESOPHAGITIS,REFRACTORY GERD,ETC.     RECOMMENDATIONS:     1) Await BRAVO results     2) continue current medications     CONSIDER REPEAT MANOMETRY/24H PH PROBE.           REPEAT EXAM:  No           ______________________________     [name]. Jarold Motto, MD, [facility]           CC:  Rollene Rotunda, MD           n.     Rosalie DoctorMarland Kitchen   [name]. Patterson at 12/26/2009 11:31 AM           Starla Link, [id number]  Note: An exclamation mark (!) indicates a result  that was not dispersed into the flowsheet. Document Creation Date: 12/26/2009 11:31 AM _______________________________________________________________________  (1) Order result status: Final Collection or observation date-time: 12/26/2009 11:24 Requested date-time:  Receipt date-time:  Reported date-time:  Referring Physician:   Ordering Physician: Sheryn Bison [phone]) Specimen Source:  Source: Launa Grill Order Number: [id number] Lab site:

## 2010-05-14 NOTE — Letter (Signed)
Summary: Previsit letter  [facility]  [address] [place], [place] [address]   Phone: [phone]  Fax: [phone]       05/01/2009 MRN: [id number]  [facility] [address] [place], [place]  [address]  Dear Angela [name],  Welcome to the Gastroenterology Division at [facility].    You are scheduled to see a nurse for your pre-procedure visit on 05-18-09 at 2:30PM on the 3rd floor at [facility], [address]  We ask that you try to arrive at our office 15 minutes prior to your appointment time to allow for check-in.  Your nurse visit will consist of discussing your medical and surgical history, your immediate family medical history, and your medications.    Please bring a complete list of all your medications or, if you prefer, bring the medication bottles and we will list them.  We will need to be aware of both prescribed and over the counter drugs.  We will need to know exact dosage information as well.  If you are on blood thinners (Coumadin, Plavix, Aggrenox, Ticlid, etc.) please call our office today/prior to your appointment, as we need to consult with your physician about holding your medication.   Please be prepared to read and sign documents such as consent forms, a financial agreement, and acknowledgement forms.  If necessary, and with your consent, a friend or relative is welcome to sit-in on the nurse visit with you.  Please bring your insurance card so that we may make a copy of it.  If your insurance requires a referral to see a specialist, please bring your referral form from your primary care physician.  No co-pay is required for this nurse visit.     If you cannot keep your appointment, please call [phone] to cancel or reschedule prior to your appointment date.  This allows [place] the opportunity to schedule an appointment for another patient in need of care.    Thank you for choosing [facility] for your medical  needs.  We appreciate the opportunity to care for you.  Please visit [place] at our website  to learn more about our practice.                     Sincerely.                                                                                                                   The Gastroenterology Division

## 2010-05-14 NOTE — Assessment & Plan Note (Signed)
Summary: rov//mbw   Primary Care Provider:  Alroy Dust, MD  CC:  3 month ROV & review of mult medical problems....  History of Present Illness: 65 y/o BF here for a follow up visit... she has mult medical problems as listed below...    ~  July 20, 2009:  she has had another eval from [facility] for her complaints of bloating, belching, gas> another colonoscopy done 2/11 w/ severe divertics, tiny sessile hyperpl polyp removed... she is taking Librax & Levsin as needed... she is looking forward to water exercises and returning to [facility] for help w/ her FM... she requests fasting blood work> see problem list below.   ~  December 19, 2009:  last week she was seen in ER & adm for 1d due to CP, pressure, & palpit... she ruled out w/ neg EKG & Enz, monitor showed rare PVC & she was started on Metoprolol 12.5mg Bid... she had recent hx w/ tachypalpit at night waking her from sleep & we called in Klonopin 0.5 Bid which has helped... she is anxious & has been using Pseudophed in Mucinex-D.Marland KitchenMarland Kitchen we reviewed hosp records> H&P, DCSummary, XRays, & Labs (repeat TFTs were WNL)- refer to Cards for eval & prob Myoview, ?Holter, & reassurance... 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   ~  March 21, 2010:  She had outpt Cards eval by Aletta Edouard w/ 2DEcho showing mild MR, norm LVF;  Stress Echo 9/11 showed normal LVF & no stress induced wall motion abnormalities;  CardioNet showed NSR w/ PVC's;  RA Dopplers were normal- no plaque & no fibromuschyperplasia... she has some palpit (PVCs) w/ dyspnea & the Clonopin 0.5mg  Bid helps... DrCrenshaw started Metoprolol 25mg Bid as well & she is stable...    She also had GI eval by DrPatterson w/ EGD 9/11 showing HH otherw normal (neg biopsies);  24H pH Probe 9/11 showed reflux in the recumbant position;  Esoph manometry 9/11 was normal;  Rx w/ Nexium daily, Carafate Prn, Levsin Prn... she is much improved  now...   Current Problem List:  HYPERTENSION (ICD-401.9) - controlled on METOPROLOL 25mg  Bid & HCT 25mg /d & K20/d... BP=116/76  today and doing well- denies HA, visual changes, CP, palipit, dizziness, syncope, dyspnea, edema, etc...  CHEST PAIN, ATYPICAL (ICD-786.59) - hx CWP x 25 yrs assoc w/ her FM... symptoms flair on & off, but doing OK overall & no angina.  ~  9/11: developed chest "pressure" assoc w/ palpit & went to ER w/ 1d adm by TH> EKG, Enz, CXR, Labs- essent neg (K=3.2 & supplemented, had rare PVC on monitor)... referred to [facility].  ~  9/11:  seen by DrCrenshaw w/ 2DEcho showing mild MR, norm LVF;  Stress Echo 9/11 showed normal LVF & no stress induced wall motion abnormalities...  PALPITATIONS (ICD-785.1) - **SEE ABOVE** she's noted tachypalpit & assoc w/ vivid dreams waking her from sleep... she was given METOPROLOL 25mg - 1/2 Bid in hosp 9/11 (monitor w/ PVC only) & this was incr to 25mg  Bid by DrCrenshaw... we called in KLONOPIN 0.5mg  Bid which helped the palpit as well... she was also drinking mod caffeine & taking Pseudophed> asked to stop both...  ~  CardioNet 9/11 showed NSR w/ PVC's...  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 she saw DrLewitt for eval  HAs & he did MRI at [facility] confirming no signif plaque & prob Fibromusc Hyperplasia... of note> she has mild HBP easily controlled on HCT & subseq RADopplers 9/11 showed normal (norm RAs, norm Ao, norm renal size etc)... she remains on ASA 81mg /d.  ~  she continues to f/u w/ DrLewitt who does repeat MRI/ MRA periodically to follow.  ~  RA Dopplers 9/11 were normal- no plaque & no fibromuschyperplasia...   HYPERCHOLESTEROLEMIA (ICD-272.0) - on SIMVASTATIN 20mg /d now...  ~  FLP 6/09 showed TChol 220, TG 88, HDL 67, LDL 138... rec- start Simva20, + diet/ exercise.  ~   FLP 03/13/08 showed TChol 188, TG 95, HDL, 80, LDL 89... looks great- same Rx.  ~  3/10 stopped Simva20 for 1 mo due to muscle symptoms- no change off med therefore restarted.  ~  FLP 9/10 showed TChol 160. 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 currently taking LEVOTHYROID Daily...  ~  labs 9/10 showed TSH= 0.19  ~  labs 4/11 showed TSH= 0.28  ~  labs 8/11 by DrPatterson showed TSH= 0.10  ~  labs 9/11 in hosp showed TSH= 0.91, FreeT3= 2.3 (2.3-4.2), FreeT4= 1.07 (0.80-1.80)  HIATAL HERNIA (ICD-553.3) & GERD (ICD-530.81) - Rx NEXIUM 40mg /d, CARAFATE Prn (which she takes for "real bad" indigestion), PANCREASE Tid, and now probiotics (ALIGN).Marland Kitchen.  ~  EGD by DrPatterson was 1/07 and showed 4cm HH, esophagitis, & duodenitis...   ~  repeat EGD 09/13/07 w/ 3cmHH, gastritis, stricture- dilated...  ~  treated 6/10 for gas/ bloating/ discomfort w/ Xifaxan but no better...  ~  repeat EGD 9/11 by DrPatterson showed HH, otherw normal & bx was neg...  DIVERTICULAR DISEASE (ICD-562.10) IRRITABLE BOWEL SYNDROME, HX OF (ICD-V12.79) COLONIC POLYPS (ICD-211.3) -   ~  colonoscopy was 1/06 showing divertics and hems... f/u planned 5 yrs.  ~  12/09 had eval for abd pain & other symptoms- CT w/ ?sm abscess- ?diverticulits... f/u colonoscopy 1/10 by DrPatterson w/ severe pan-diverticulosis but no inflamm (resolved on Cipro/ Flagyl Rx)...  ~  2/11: another eval from DrPatterson for gas/ bloating/ etc> colonoscopy showed divertics & tiny hyperpl polyp removed...  ~  8/11: on going eval from DrPatterson (LUQ pain & nausea)- CT Abd showed large & sm bowel divertics w/o inflamm, s/p GB & Hyst, & mild fatty liver dis...  OSTEOARTHRITIS (ICD-715.90) - sees DrDaldorf... hx bilat shoulder problems w/ shots & PT-  see his notes. FIBROMYALGIA (ICD-729.1) - DrDeveshwar tried Lyrica 50mg Tid but off this now per DrLewitt due to blurry vision & 15# weight gain... she takes SKELAXIN 800mg TidPrn, CELEBREX (ave 1 tab Qod), and Tylenol...   ~  she saw DrRowe yrs ago and had Rheum re-evaluation by Rober Minion 6/09- they discussed Lyrica/ Topamax/ Flexeril therapy (also rec Align for her IBS, Urology referral for her IC, supplements for her DJD, & lab work up for her fatigue)...  ~  now she says DrLewitt is treating her FM> she goes to [facility] for massage & water exercises.  VITAMIN D DEFICIENCY (ICD-268.9) - on Vit D 50000 u weekly...  ~  labs 6/09 showed Vit D level = 22... start 50K weekly...  ~  labs 11/09 showed Vit D level = 51... asked to  switch to OTC Vit D supplement but she continued the perscription.  MIGRAINE HEADACHE (ICD-346.90) - uses [facility], plus the above meds and is followed by DrLewitt... also saw him for dizziness and Dx w/ benign positional vertigo Rx w/ exercises...  ANXIETY (ICD-300.00) - on KLONOPIN 0.5mg  Bid...  ACNE ROSACEA (ICD-695.3)  Health Maintenance - GYN= DrARoss (on Premarin) & he does PAP smears, Mammograms, & BMD's on her...   Preventive Screening-Counseling & Management  Alcohol-Tobacco     Smoking Status: never     Passive Smoke Exposure: no  Caffeine-Diet-Exercise     Caffeine use/day: 0     Does Patient Exercise: no  Allergies: 1)  ! Tetracycline 2)  ! Codeine  Comments:  Nurse/Medical Assistant: The patient's medications and allergies were reviewed with the patient and were updated in the Medication and Allergy Lists.  Past History:  Past Medical History: ATOPIC RHINITIS (ICD-477.9) SINUSITIS, ACUTE (ICD-461.9) HYPERTENSION (ICD-401.9) CHEST PAIN, ATYPICAL (ICD-786.59) PALPITATIONS (ICD-785.1) CAROTID BRUIT (ICD-785.9) OTHER SPECIFIED DISORDERS OF ARTERIES&ARTERIOLES (ICD-447.8) HYPERLIPIDEMIA (ICD-272.4) HYPOTHYROIDISM  (ICD-244.9) HIATAL HERNIA (ICD-553.3) GERD (ICD-530.81) ABDOMINAL PAIN, GENERALIZED (ICD-789.07) EXCESSIVE BELCHING (ICD-787.3) DIVERTICULAR DISEASE (ICD-562.10) IRRITABLE BOWEL SYNDROME, HX OF (ICD-V12.79) COLONIC POLYPS (ICD-211.3) FREQUENCY, URINARY (ICD-788.41) OSTEOARTHRITIS (ICD-715.90) FIBROMYALGIA (ICD-729.1) VITAMIN D DEFICIENCY (ICD-268.9) MIGRAINE HEADACHE (ICD-346.90) ANXIETY (ICD-300.00) ACNE ROSACEA (ICD-695.3)  Past Surgical History: Cholecystectomy Hysterectomy C-section Right foot Chevron osteotomy  Family History: Reviewed history from 12/24/2009 and no changes required. Mother with MVP Father - hypertension Sister - hypertension, and DM Second sister alive at age 45 with significant medical history of hypertension Family History of Colon Cancer: Maternal Aunt Family History of Stomach Cancer: Maternal Aunt Family History of Colon Polyps: Father, Sister No premature CAD  Social History: Reviewed history from 12/24/2009 and no changes required. Patient is a retired Production designer, theatre/television/film Patient states never smoked Patient is married with 2 children Alcohol Use - no Patient does not get regular exercise  Review of Systems      See HPI       The patient complains of decreased hearing, chest pain, dyspnea on exertion, headaches, abdominal pain, severe indigestion/heartburn, muscle weakness, and difficulty walking.  The patient denies anorexia, fever, weight loss, weight gain, vision loss, hoarseness, syncope, peripheral edema, prolonged cough, hemoptysis, melena, hematochezia, hematuria, incontinence, suspicious skin lesions, transient blindness, depression, unusual weight change, abnormal bleeding, enlarged lymph nodes, and angioedema.    Vital Signs:  Patient profile:   65 year old female Height:      62 inches Weight:      159.38 pounds BMI:     29.26 O2 Sat:      100 % on Room air Temp:     98.2 degrees F oral Pulse rate:   72 / minute BP sitting:    116 / 76  (left arm) Cuff size:   regular  Vitals Entered By: Randell Loop CMA (March 21, 2010 10:22 AM)  O2 Sat at Rest %:  100 O2 Flow:  Room air CC: 3 month ROV & review of mult medical problems... Is Patient Diabetic? No Pain Assessment Patient in pain? no      Comments meds updated today with pt   Physical Exam  Additional Exam:  WD, WN, 64 y/o BF in NAD...  GENERAL:  Alert & oriented; pleasant & cooperative... HEENT:  [place]/AT, EOM-full, PERRLA, EACs-clear, TMs-wnl, NOSE- rosacea rash,  THROAT-clear & wnl. NECK:  Supple w/ fair ROM; no JVD; sl decr carotid impulses w/ bilat L>R bruits ; no thyromegaly or  nodules palpated; no lymphadenopathy. CHEST:  Clear to P & A; without wheezes/ rales/ or rhonchi heard... +trigger points. HEART:  Regular Rhythm; without murmurs/ rubs/ or gallops detected... ABDOMEN:  Soft & nontender; normal bowel sounds; no organomegaly or masses detected. EXT: without deformities or arthritic changes; no varicose veins/ venous insuffic/ or edema. NEURO:  no focal neuro deficits... DERM:  without lesions seen x acne.    MISC. Report  Procedure date:  03/21/2010  Findings:      DATA REVIEWED:  ~  extensive cardiology eval by DrCrenshaw reviewed w/ pt...  ~  extensive GI eval by DrPatterson reviewed w/ pt...   Impression & Recommendations:  Problem # 1:  HYPERTENSION (ICD-401.9) Controlled>  same meds. Her updated medication list for this problem includes:    Metoprolol Tartrate 25 Mg Tabs (Metoprolol tartrate) .Marland Kitchen... 1 two times a day    Hydrochlorothiazide 25 Mg Tabs (Hydrochlorothiazide) .Marland Kitchen... Take 1 tablet by mouth once a day  Problem # 2:  CHEST PAIN, ATYPICAL (ICD-786.59) CP & Palpit are improved w/ the Metop Rx & adjustments as noted...  Problem # 3:  CAROTID BRUIT (ICD-785.9) Stable on ASA, and fibromusc hyperplasia followed by DrLewitt... RA dopplers were neg as noted.  Problem # 4:  HYPERLIPIDEMIA (ICD-272.4) Stable off the  Statin Rx at present...  Problem # 5:  HYPOTHYROIDISM (ICD-244.9) Thyroid stable on this dose... Her updated medication list for this problem includes:    Synthroid 75 Mcg Tabs (Levothyroxine sodium) .Marland Kitchen... 1 tab daily...  Problem # 6:  GERD (ICD-530.81) Upper GI improved  on Rx from DrPatterson... The following medications were removed from the medication list:    Librax 2.5-5 Mg Caps (Clidinium-chlordiazepoxide) .Marland Kitchen... 1 by mouth three times a day prn Her updated medication list for this problem includes:    Nexium 40 Mg Cpdr (Esomeprazole magnesium) .Marland Kitchen... Take 1 capsule by mouth once a day    Carafate 1 Gm/59ml Susp (Sucralfate) .Marland Kitchen... Take 10 cc by mouth as needed    Levsin 0.125 Mg Tabs (Hyoscyamine sulfate) .Marland Kitchen... Take one by mouth every 4 hours as needed  Problem # 7:  IRRITABLE BOWEL SYNDROME, HX OF (ICD-V12.79) Lower GI symptoms similarly improved on Rx from DrPatterson...  Problem # 8:  FIBROMYALGIA (ICD-729.1) She tells me that DrLewitt is following her FM now... Her updated medication list for this problem includes:    Aspirin 81 Mg Tbec (Aspirin) .Marland Kitchen... 1 once daily    Hydrocodone-acetaminophen 5-500 Mg Tabs (Hydrocodone-acetaminophen) .Marland Kitchen... 1 tab q 4-6 hrs as needed for pain    Celebrex 200 Mg Caps (Celecoxib) .Marland Kitchen... 1 tab by mouth once daily as needed for arthritis pain...    Skelaxin 800 Mg Tabs (Metaxalone) .Marland Kitchen... Take 1 tablet by mouth two times a day as needed for muscle spasm...  Problem # 9:  ANXIETY (ICD-300.00) KLONOPIN is helping... Her updated medication list for this problem includes:    Clonazepam 0.5 Mg Tabs (Clonazepam) .Marland Kitchen... 1 tab by mouth two times a day  Problem # 10:  OTHER MEDICAL PROBLEMS AS NOTED>>>  Complete Medication List: 1)  Nasonex 50 Mcg/act Susp (Mometasone furoate) .... 2 sprays in each nostril daily 2)  Aspirin 81 Mg Tbec (Aspirin) .Marland Kitchen.. 1 once daily 3)  Metoprolol Tartrate 25 Mg Tabs (Metoprolol tartrate) .Marland Kitchen.. 1 two times a day 4)   Hydrochlorothiazide 25 Mg Tabs (Hydrochlorothiazide) .... Take 1 tablet by mouth once a day 5)  Klor-con M20 20 Meq Tbcr (Potassium chloride crys cr) .... Take 1  tablet by mouth once a day 6)  Synthroid 75 Mcg Tabs (Levothyroxine sodium) .Marland Kitchen.. 1 tab daily.Marland KitchenMarland Kitchen 7)  Nexium 40 Mg Cpdr (Esomeprazole magnesium) .... Take 1 capsule by mouth once a day 8)  Carafate 1 Gm/29ml Susp (Sucralfate) .... Take 10 cc by mouth as needed 9)  Levsin 0.125 Mg Tabs (Hyoscyamine sulfate) .... Take one by mouth every 4 hours as needed 10)  Pancreaze 40347 Unit Cpep (Pancrelipase (lip-prot-amyl)) .... Three times a day 11)  Align Caps (Misc intestinal flora regulat) .... Take one capsule by mouth daily 12)  Premarin 0.625 Mg Tabs (Estrogens conjugated) .... Take 1 tablet by mouth once a day 13)  Hydrocodone-acetaminophen 5-500 Mg Tabs (Hydrocodone-acetaminophen) .Marland Kitchen.. 1 tab q 4-6 hrs as needed for pain 14)  Celebrex 200 Mg Caps (Celecoxib) .Marland Kitchen.. 1 tab by mouth once daily as needed for arthritis pain... 15)  Midrin 325-65-100 Mg Caps (Apap-isometheptene-dichloral) .... Use as needed for headaches 16)  Lidoderm 5 % Ptch (Lidocaine) .... Use as needed 17)  Skelaxin 800 Mg Tabs (Metaxalone) .... Take 1 tablet by mouth two times a day as needed for muscle spasm... 18)  Vitamin D 42595 Unit Caps (Ergocalciferol) .... Take one tablet by mouth once a week 19)  Clonazepam 0.5 Mg Tabs (Clonazepam) .Marland Kitchen.. 1 tab by mouth two times a day 20)  Ovace Cream  .... As needed 21)  Zithromax Z-pak 250 Mg Tabs (Azithromycin) .... Take as directed...  Patient Instructions: 1)  Today we updated your med list- see below.... 2)  Continue your current meds the same... 3)  We also wrote for a ZPak for Prn use.Marland KitchenMarland Kitchen 4)  Call for any problems.Marland KitchenMarland Kitchen 5)  Please schedule a follow-up appointment in 6 months. Prescriptions: ZITHROMAX Z-PAK 250 MG TABS (AZITHROMYCIN) take as directed...  #1 x 1   Entered and Authorized by:   Michele Mcalpine MD   Signed by:    Michele Mcalpine MD on 03/21/2010   Method used:   Print then Give to Patient   RxID:   [id number]

## 2010-05-14 NOTE — Procedures (Signed)
Summary: PH PROBE    pH Probe Study  Procedure date:  01/07/2010  Findings:      Transnasal:     pH Probe Study  Procedure date:  01/07/2010  Findings:      Transnasal:   24 hour Ambulatory pH probe completed on 01/07/10.This exam shows a total DeMeester score of 26.3 normal less than 22. There is no significant proximal acid reflux but there is significant recumbent reflux in the distal channel. Recumbent time the pH is less than 4 was 4.9% normal less than 1.2%. Her 75 reflux episodes recorded with 3 longer than 5 minutes. There was poor correlation with her symptoms and acid reflux.  Assessment this pH probe shows acid reflux mostly in the recumbent position. Previous esophageal manometry was normal. This patient should respond well to PPI therapy but may need this twice a day.  Appended Document: PH PROBE billed

## 2010-05-14 NOTE — Progress Notes (Signed)
Summary: talk to nurse  Phone Note Call from Patient Call back at Home Phone [phone] Call back at [phone](cell)   Caller: Patient Call For: nadel Summary of Call: Pt states she saw Dr. Vela Prose 2 weeks ago and he said that she should talk with SN about scheduling a MRA on her kidney artery. She wants to talk with him in ref to this. Initial call taken by: Darletta Moll,  September 14, 2009 9:11 AM  Follow-up for Phone Call        pt states she just saw dr lewitt and he told her to call dr Kriste Basque to see if the renal mra study needed to be done--see attatched printed note as ref to what she is talking aboutand pls advise  Philipp Deputy [facility]  September 14, 2009 9:51 AM   Additional Follow-up for Phone Call Additional follow up Details #1::        per SN---1.   BP is well controlled on meds   2.  Renal function is normal  3.  No bruits--noise heard in the vessels that indicate the blood flow---  therefore we will not need MRI of renal arteries but if she feels that she would like to have this done--then by all means we can order it for her to r/o any renal artery problems.  thanks Randell Loop CMA  September 14, 2009 12:31 PM     Additional Follow-up for Phone Call Additional follow up Details #2::    pt advised and she states she will hold off on MRI for now and think it over. Carron Curie CMA  September 14, 2009 1:55 PM

## 2010-05-14 NOTE — Assessment & Plan Note (Signed)
Summary: chest pressure / cj   Primary Care Provider:  Alroy Dust, MD  CC:  5 month ROV & post hosp check....  History of Present Illness: 65 y/o BF here for a follow up visit... she has mult medical problems as listed below...    ~  In Jan09 we noted a L>R CBruits... CDopplers showed no signif plaque but incr velocities w/ 40-59% bilat ICA stenoses... REC for ASA daily and f/u CDoppler in 47yr... she had a f/u appt w/ DrLewit for her HA's and he rec an MRI scan to eval fibromusc hyperplasia... done at [facility] & results indicate no plaque but incr velocities c/w FIBROMUSC HYPERPLASIA- he is following and plans repeat studies... of note she has mild HBP which is easily controlled on HCTZ...   ~  Nov09:  she saw DrDeveshwar for Rheum eval in 6/09- they discussed poss Lyrica/ Topamax/ Flexeril therapy (also rec Align for her IBS, Urology referral for her IC, supplements for her DJD, & lab work up for her fatigue)... she is taking the LYRICA 50mg Tid and sl improved... she has not been back to DrD and states that DrLewitt is following her Fibromyalgia...    ~  Aug 14, 2008:  mult somatic complaints and f/u issues- c/o urinary freq- check UA.      ** GI eval by DrPatterson 12/09 w/ IBS & abn CT Pelvis- ?diverticulitis w/ sm abscess... Rx'd w/ Cipro/ Flagyl and f/u colonoscopy 1/10 showed severe pan-diverticulosis (no inflamm)... symptoms resolved.      ** had right shoulder pain w/ injection by DrDalldorf- improved...      ** saw TP 3/10 w/ muscle aches on Simva20- labs w/ CPK, LFTs, sed all WNL.Marland Kitchen. held for 1 month & symptoms no better, therefore restarted.      ** had Mammogram 3/10 At [facility]- neg, f/u 67yr...       **noted melena w/ repeat GI eval 4/10 showing heme neg stools and Hg= 12.6...       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   ~  December 15, 2008:  recent sinus infection Rx w/  Augmentin/ Mucinex & slowly improving... she is due for fasting labs and will return next week for these to be drawn...      ** saw DrPatterson 6/10 for belching/ bloating/ discomfort c/w IBS- Rx w/ Xifaxan x10d, etc...      ** saw DrDaldorf 7/10 for shoulder & knee pains- given another right shoulder injection.      ** saw DrLewitt 7/10 for f/u fibromusc dysplasia of carotids- on ASA, doing well, he plans repeat MRI/MRA in 6yrs;  and f/u of her FM- going to Integrative therapies and doing water exercises...    ~  July 20, 2009:  she has had another eval from [facility] for her complaints of bloating, belching, gas> another colonoscopy done 2/11 w/ severe divertics, tiny sessile hyperpl polyp removed... she is taking Librax & Levsin as needed... she is looking forward to water exercises and returning to Integrative therapies for help w/ her FM... she requests fasting blood work> see problem list below.   ~  December 19, 2009:  last week she was seen in ER & adm for 1d due to CP, pressure, & palpit... she ruled out w/ neg EKG & Enz, monitor showed rare PVC & she was started on Metoprolol 12.5mg Bid... she had recent hx  w/ tachypalpit at night waking her from sleep & we called in Klonopin 0.5 Bid which has helped... she is anxious & has been using Pseudophed in Mucinex-D.Marland KitchenMarland Kitchen we reviewed hosp records> H&P, DCSummary, XRays, & Labs (repeat TFTs were WNL)- refer to Cards for eval & prob Myoview, ?Holter, & reassurance... 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    Current Problem List:  HYPERTENSION (ICD-401.9) - controlled on HCT 25mg /d & K20/d... BP=128/72  today and doing well- denies HA, visual changes, CP, palipit, dizziness, syncope, dyspnea, edema, etc...  CHEST PAIN, ATYPICAL (ICD-786.59) - hx CWP x 25 yrs assoc w/ her FM... symptoms flair on & off, but doing OK overall & no angina.  ~  9/11: developed chest "pressure" assoc w/ palpit &  went to ER w/ 1d adm by TH> EKG, Enz, CXR, Labs- essent neg (K=3.2 & supplemented, had rare PVC on monitor)... referred to [facility] for eval & prob Myoview.  PALPITATIONS (ICD-785.1) - **SEE ABOVE** she's noted tachypalpit recently & assoc w/ vivid dreams waking her from sleep... she was given METOPROLOL 25mg - 1/2 Bid in hosp 9/11 (monitor w/ PVC only) & we called in KLONOPIN 0.5mg  Bid which helped... she was also drinking mod caffeine & taking Pseudophed> asked to stop both...  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 he requests Renal Art Dopplers to check renal arts & kidneys (her BP has been easily controlled on simple Rx).  HYPERCHOLESTEROLEMIA (ICD-272.0) - on SIMVASTATIN 20mg /d now...  ~  FLP 6/09 showed TChol 220, TG 88, HDL 67, LDL 138... rec- start Simva20, + diet/ exercise.  ~  FLP 03/13/08 showed TChol 188, TG 95, HDL, 80, LDL 89... looks great- same Rx.  ~  3/10 stopped Simva20 for 1 mo due to muscle symptoms- no change off med therefore restarted.  ~  FLP 9/10 showed TChol 160. 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 currently taking LEVOTHYROID Daily...  ~  labs 9/10 showed TSH= 0.19  ~  labs 4/11 showed TSH= 0.28  ~  labs 8/11 by DrPatterson showed TSH= 0.10  ~  labs 9/11 in hosp showed TSH= 0.91, FreeT3= 2.3 (2.3-4.2), FreeT4= 1.07 (0.80-1.80)  HIATAL HERNIA (ICD-553.3) & GERD (ICD-530.81) - Rx NEXIUM 40mg /d, CARAFATE Prn (which she takes for "real bad" indigestion), PANCREASE Tid, and now probiotics (ALIGN).Marland Kitchen.  ~  EGD by DrPatterson was 1/07 and showed 4cm HH, esophagitis, & duodenitis...   ~  repeat EGD 09/13/07 w/ 3cmHH, gastritis, stricture- dilated...  ~  treated 6/10 for gas/ bloating/ discomfort w/  Xifaxan but no better...  DIVERTICULAR DISEASE (ICD-562.10) IRRITABLE BOWEL SYNDROME, HX OF (ICD-V12.79) COLONIC POLYPS (ICD-211.3) -   ~  colonoscopy was 1/06 showing divertics and hems... f/u planned 5 yrs.  ~  12/09 had eval for abd pain & other symptoms- CT w/ ?sm abscess- ?diverticulits... f/u colonoscopy 1/10 by DrPatterson w/ severe pan-diverticulosis but no inflamm (resolved on Cipro/ Flagyl Rx)...  ~  2/11: another eval from DrPatterson for gas/ bloating/ etc> colonoscopy showed divertics & tiny hyperpl polyp removed...  ~  8/11: on going eval from DrPatterson (LUQ pain & nausea)- CT Abd  showed large & sm bowel divertics w/o inflamm, s/p GB & Hyst, & mild fatty liver dis...  OSTEOARTHRITIS (ICD-715.90) - sees DrDaldorf... hx bilat shoulder problems w/ shots & PT- see his notes. FIBROMYALGIA (ICD-729.1) - off LYRICA per DrLewitt due to blurry vision & 15# weight gain... takes SKELAXIN 800mg TidPrn, CELEBREX (ave 1 tab Qod), and Tylenol... she saw DrRowe yrs ago and she wanted a Rheum re-evaluation- she saw DrDeveshwar 6/09- see above... now she says DrLewitt is treating her FM.  VITAMIN D DEFICIENCY (ICD-268.9) - on Vit D 50000 u weekly...  ~  labs 6/09 showed Vit D level = 22... start 50K weekly...  ~  labs 11/09 showed Vit D level = 51... asked to switch to OTC Vit D supplement but she continued the perscription.  MIGRAINE HEADACHE (ICD-346.90) - uses [facility], plus the above meds and is followed by DrLewitt... also saw him for dizziness and Dx w/ benign positional vertigo Rx w/ exercises...  ANXIETY (ICD-300.00)  ACNE ROSACEA (ICD-695.3)  Health Maintenance - GYN= DrARoss (on Premarin) & he does PAP smears, Mammograms, & BMD's on her...   Preventive Screening-Counseling & Management  Alcohol-Tobacco     Smoking Status: never     Passive Smoke Exposure: no  Caffeine-Diet-Exercise     Caffeine use/day: 0     Does Patient Exercise: no  Allergies: 1)  ! Tetracycline 2)  !  Codeine  Comments:  Nurse/Medical Assistant: The patient's medications and allergies were reviewed with the patient and were updated in the Medication and Allergy Lists.  Past History:  Past Medical History: HYPERTENSION (ICD-401.9) CHEST PAIN, ATYPICAL (ICD-786.59) PALPITATIONS (ICD-785.1) CAROTID BRUIT (ICD-785.9) HYPERCHOLESTEROLEMIA (ICD-272.0) HYPOTHYROIDISM (ICD-244.9) HIATAL HERNIA (ICD-553.3) GERD (ICD-530.81) DIVERTICULAR DISEASE (ICD-562.10) IRRITABLE BOWEL SYNDROME, HX OF (ICD-V12.79) COLONIC POLYPS (ICD-211.3) OSTEOARTHRITIS (ICD-715.90) FIBROMYALGIA (ICD-729.1) VITAMIN D DEFICIENCY (ICD-268.9) MIGRAINE HEADACHE (ICD-346.90) ANXIETY (ICD-300.00) ACNE ROSACEA (ICD-695.3)  Past Surgical History: Cholecystectomy Hysterectomy C-section  Family History: Reviewed history from 04/06/2008 and no changes required. Mother deceased at age 105 due to heart problems Father alive at age 32 with significat medical history of hypertension Sister alive at age 7, with significant medical history of hypertension, and DM Second sister alive at age 40 with significant medical history of hypertension Family History of Colon Cancer: Maternal Aunt Family History of Stomach Cancer: Maternal Aunt Family History of Colon Polyps: Father, Sister  Social History: Reviewed history from 04/06/2008 and no changes required. Patient is a retired Production designer, theatre/television/film Patient states never smoked Patient is married with 2 children Alcohol Use - no Patient does not get regular exercise.   Review of Systems      See HPI       The patient complains of chest pain and dyspnea on exertion.  The patient denies anorexia, fever, weight loss, weight gain, vision loss, decreased hearing, hoarseness, syncope, peripheral edema, prolonged cough, headaches, hemoptysis, abdominal pain, melena, hematochezia, severe indigestion/heartburn, hematuria, incontinence, muscle weakness, suspicious skin lesions,  transient blindness, difficulty walking, depression, unusual weight change, abnormal bleeding, enlarged lymph nodes, and angioedema.    Vital Signs:  Patient profile:   65 year old female Height:      62 inches Weight:      160.25 pounds BMI:     29.42 O2 Sat:      99 % on Room air Temp:     97.0 degrees F oral Pulse rate:   87 / minute BP sitting:   128 / 72  (left arm) Cuff size:   regular  Vitals Entered  By: Randell Loop CMA (December 19, 2009 2:05 PM)  O2 Sat at Rest %:  99 O2 Flow:  Room air CC: 5 month ROV & post hosp check... Is Patient Diabetic? No Pain Assessment Patient in pain? yes      Onset of pain  chest discomfort Comments meds updated today with pt   Physical Exam  Additional Exam:  WD, WN, 64 y/o BF in NAD...  GENERAL:  Alert & oriented; pleasant & cooperative... HEENT:  [place]/AT, EOM-full, PERRLA, EACs-clear, TMs-wnl, NOSE- rosacea rash,  THROAT-clear & wnl. NECK:  Supple w/ fair ROM; no JVD; sl decr carotid impulses w/ bilat L>R bruits ; no thyromegaly or nodules palpated; no lymphadenopathy. CHEST:  Clear to P & A; without wheezes/ rales/ or rhonchi heard... HEART:  Regular Rhythm; without murmurs/ rubs/ or gallops detected... ABDOMEN:  Soft & nontender; normal bowel sounds; no organomegaly or masses detected. EXT: without deformities or arthritic changes; no varicose veins/ venous insuffic/ or edema. NEURO:  no focal neuro deficits... DERM:  without lesions seen x acne.    MISC. Report  Procedure date:  12/19/2009  Findings:      DATA REVIEWED:  ~  DrPatterson's EMR note 12/07/09 & labs...  ~  CT Abd done 8/31/11reviewed...  ~  [facility] records 9/3-4/11 w/ H&P, DCSummary, XRays & Labs...   Impression & Recommendations:  Problem # 1:  CHEST PAIN (ICD-786.50) Hx atypic CP assoc w/ her FM, but this was diff> tachypalpit & chest pressure got her one day in hosp where she r/o for MI & low dose Metoprolol was started... we discussed need for Cardiac  eval & prob Myoview & she concurrs... Orders: Cardiology Referral (Cardiology)  Problem # 2:  PALPITATIONS (ICD-785.1) EKG & monitor showed just VC... she was taking mod caffeine & Pseudophed in Mucinex-D... asked to stop all caffeine, and stop all decongestants- ok to use plain mucinex... ? if she needs monitor as noctural palpit are better off Pseudophed, on Klonopin... we will check w/ Cards for their opinion... Orders: Cardiology Referral (Cardiology)  Problem # 3:  HYPERTENSION (ICD-401.9) Controlled>  continue same meds for now... we will set up renal art dopplers per DrLewitt's request... Her updated medication list for this problem includes:    Hydrochlorothiazide 25 Mg Tabs (Hydrochlorothiazide) .Marland Kitchen... Take 1 tablet by mouth once a day Orders: Vascular Other (Vascular other)  Problem # 4:  CAROTID BRUIT (ICD-785.9) Known fibromusc hyperplasia of carotids- followed by DrLewitt... as above> he requests RADopplers. Orders: Vascular Other (Vascular other)  Problem # 5:  HYPERCHOLESTEROLEMIA (ICD-272.0) FLP looks good... The following medications were removed from the medication list:    Simvastatin 20 Mg Tabs (Simvastatin) .Marland Kitchen... Take one tablet by mouth at bedtime  Problem # 6:  HYPOTHYROIDISM (ICD-244.9) Labs were suggestive of over-replaced dose 8/11 by DrPatterson but repaet studies in the hosp were WNL... keep same. Her updated medication list for this problem includes:    Synthroid 75 Mcg Tabs (Levothyroxine sodium) .Marland Kitchen... 1 tab daily...  Problem # 7:  DIVERTICULAR DISEASE (ICD-562.10) GI per DrPatterson w/ eval in progress...  Problem # 8:  OSTEOARTHRITIS (ICD-715.90) Followed by Loralee Pacas, etc... Her updated medication list for this problem includes:    Celebrex 200 Mg Caps (Celecoxib) .Marland Kitchen... 1 tab by mouth once daily as needed for arthritis pain...    Hydrocodone-acetaminophen 5-500 Mg Tabs (Hydrocodone-acetaminophen) .Marland Kitchen... 1 tab q 4-6 hrs as needed for  pain  Complete Medication List: 1)  Nasonex 50 Mcg/act Susp (Mometasone  furoate) .... 2 sprays in each nostril daily 2)  Hydrochlorothiazide 25 Mg Tabs (Hydrochlorothiazide) .... Take 1 tablet by mouth once a day 3)  Klor-con M20 20 Meq Tbcr (Potassium chloride crys cr) .... Take 1 tablet by mouth once a day 4)  Synthroid 75 Mcg Tabs (Levothyroxine sodium) .Marland Kitchen.. 1 tab daily.Marland KitchenMarland Kitchen 5)  Nexium 40 Mg Cpdr (Esomeprazole magnesium) .... Take 1 capsule by mouth once a day 6)  Carafate 1 Gm/48ml Susp (Sucralfate) .... Take 10 cc by mouth as needed 7)  Levsin 0.125 Mg Tabs (Hyoscyamine sulfate) .... Take one by mouth every 4 hours as needed 8)  Librax 2.5-5 Mg Caps (Clidinium-chlordiazepoxide) .Marland Kitchen.. 1 by mouth three times a day prn 9)  Pancreaze 16109 Unit Cpep (Pancrelipase (lip-prot-amyl)) .... Three times a day 10)  Align Caps (Misc intestinal flora regulat) .... Take one capsule by mouth daily 11)  Premarin 0.625 Mg Tabs (Estrogens conjugated) .... Take 1 tablet by mouth once a day 12)  Celebrex 200 Mg Caps (Celecoxib) .Marland Kitchen.. 1 tab by mouth once daily as needed for arthritis pain... 13)  Midrin 325-65-100 Mg Caps (Apap-isometheptene-dichloral) .... Use as needed for headaches 14)  Lidoderm 5 % Ptch (Lidocaine) .... Use as needed 15)  Skelaxin 800 Mg Tabs (Metaxalone) .... Take 1 tablet by mouth two times a day as needed for muscle spasm... 16)  Vitamin D 60454 Unit Caps (Ergocalciferol) .... Take one tablet by mouth once a week 17)  Hydrocodone-acetaminophen 5-500 Mg Tabs (Hydrocodone-acetaminophen) .Marland Kitchen.. 1 tab q 4-6 hrs as needed for pain 18)  Klonopin 0.5 Mg Tabs (Clonazepam) .Marland Kitchen.. 1 by mouth two times a day as directed...  Patient Instructions: 1)  Today we updated your med list- see below.... 2)  Continue your current meds the same... 3)  Be sure to elim all caffeine & NO PSEUDOPHED.Marland KitchenMarland Kitchen 4)  We will arrange for a cardiac evaluation, and we will sched the needed renal artery dopplers... 5)  OK to stop  the Magnesium, continue the KCl, and use the Klonopin as we discussed... 6)  Call for any questions.Marland KitchenMarland Kitchen 7)  Please schedule a follow-up appointment in 4 months, sooner as needed. Prescriptions: KLONOPIN 0.5 MG TABS (CLONAZEPAM) 1 by mouth two times a day as directed...  #60 x 12   Entered and Authorized by:   Michele Mcalpine MD   Signed by:   Michele Mcalpine MD on 12/19/2009   Method used:   Print then Give to Patient   RxID:   [id number]

## 2010-05-14 NOTE — Progress Notes (Signed)
Summary: health form  Phone Note Call from Patient Call back at [phone]   Caller: Patient Call For: nadel Summary of Call: Health Exam form needs to be filled out for pt's job.  Need by next week.  Form at [employer] Initial call taken by: Eugene Gavia,  May 29, 2009 10:22 AM  Follow-up for Phone Call        Pt came in today 05-29-09 adn ahd TB skin test for Job.  Pt also needs health form filled out.  Marliss Czar is checking with Dr Kriste Basque about form. Follow-up by: Abigail Miyamoto RN,  May 29, 2009 10:28 AM  Additional Follow-up for Phone Call Additional follow up Details #1::        form has been signed by SN and i called and spoke with pt and she is aware that the form is ready to be picked up. Randell Loop [facility]  June 04, 2009 9:49 AM

## 2010-05-14 NOTE — Progress Notes (Signed)
Summary: congestion > zpak, mucinex, zyrtec  Phone Note Call from Patient Call back at [phone]   Caller: Patient Call For: nadel Summary of Call: Pt c/o congestion, sneezing, coughing since last Thurs wants an abx called in.//[facility] [address] Initial call taken by: Darletta Moll,  March 13, 2010 8:27 AM  Follow-up for Phone Call        Called, spoke with pt.  She c/o sneezing, head congestion, drainage, clear pheglm, dry cough x 1 wk.  Sweats Monday night.  Was taking benadryl with no relief.  Told by pharmacy she could not take decongestants d/t beta blocker she is taking.  Requesting SN's recs.  [facility] [address]  Allergies (verified):  1.  ! TETRACYCLINE 2.  ! CODEINE  Dr. Kriste Basque, pls advise.  Thanks!  Follow-up by: Gweneth Dimitri RN,  March 13, 2010 9:41 AM  Additional Follow-up for Phone Call Additional follow up Details #1::        per SN----ok for zpak #1   take as directed, mucinex 600mg   1-2 by mouth two times a day with plenty of fluids ad zyrtec otc  1 daily for sneezing. thanks Randell Loop CMA  March 13, 2010 9:59 AM   Called, spoke with pt.  She was informed of above per SN and aware zpak rx sent to [facility] [address]  She verbalized understanding of instructions and will call office back if sxs worsen or do not improve.   Additional Follow-up by: Gweneth Dimitri RN,  March 13, 2010 10:10 AM    New/Updated Medications: ZITHROMAX Z-PAK 250 MG TABS (AZITHROMYCIN) take as directed Prescriptions: ZITHROMAX Z-PAK 250 MG TABS (AZITHROMYCIN) take as directed  #1 x 0   Entered by:   Gweneth Dimitri RN   Authorized by:   Michele Mcalpine MD   Signed by:   Gweneth Dimitri RN on 03/13/2010   Method used:   [facility] to        [facility]  [address] [id number]       [address]       [place], [place]  [address]       Ph: [phone] or [phone]       Fax: [phone]   RxID:   [id number]

## 2010-05-14 NOTE — Assessment & Plan Note (Signed)
Summary: Acute NP office visit - sinus inf   Primary Provider/Referring Provider:  Alroy Dust, MD  CC:  pt states she has sinus pressure/congestion, no drianage, and pressure in both ears x2days.  History of Present Illness: 65 year female with known history of HTN, FM and GERD   ~  In Jan09 we noted a L>R CBruits... 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 REC for ASA daily and f/u CDoppler in 3yr... she had a f/u appt w/ DrLewit for her HA's and he rec an MRI scan to eval fibromusc hyperplasia... done at [facility] & results indicate no plaque but incr velocities c/w FIBROMUSC HYPERPLASIA- he is following and plans repeat studies... of note she has mild HBP which is easily controlled on HCTZ...   ~  March 13, 2008:  she saw DrDeveshwar for Rheum eval in Jun09- they discussed poss Lyrica/ Topamax/ Flexeril therapy (also rec Align for her IBS, Urology referral for her IC, supplements for her DJD, & lab work up for her fatigue)... she is taking the LYRICA 50mg Tid and sl improved... she has not been back to DrD and states that DrLewitt is following her Fibromyalgia... she requests we send labs to his office...  June 12, 2008 --Pt presents for upper arm/shoulder pain/aches over last 4 weeks.  Mainly muscle aches/tenderness. 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 Had some relief. Pt is concerned that statin rx-simvastatin could be cause of aches. She has chronic fatigue/FM seen by Devashar. -Stop simvastatin for 4 weeks.   July 10, 2008--Returns for follow up. Reviewed labs w/ pt. CK/LFTs/ESR were nml. Since last visit has not noticed much improvement in aches off of statin rx. . Has upcoming ov w/ ortho next week for eval. c/o aches, tenderness along shoulders, arms and back.      December 11, 2008 --Presents for an acute office visit. Complains of head  congestion/drainage with yellow mucus, headache, puffiness around the eyes, PND causing cough x5days - denies f/c/s. OTC not helping. Denies chest pain, dyspnea, orthopnea, hemoptysis, fever, n/v/d, edema, headache,recent travel or antibiotics.    Aug 24, 2009--Presents for 2 days of nasal congestion, ear pressure, sinus pressure. Mainly clear discharge, no teeth pain.  She as stopped her nasones. Not used any meds for treatment.  Rare cough. Had been doing okay until last couple of days. She feels stopped up. Denies chest pain, dyspnea, orthopnea, hemoptysis, fever, n/v/d, edema, headache,recent travel or antibiotics.      Medications Prior to Update: 1)  Nasonex 50 Mcg/act Susp (Mometasone Furoate) .... 2 Sprays in Each Nostril Daily 2)  Hydrochlorothiazide 25 Mg Tabs (Hydrochlorothiazide) .... Take 1 Tablet By Mouth Once A Day 3)  Klor-Con M20 20 Meq Tbcr (Potassium Chloride Crys Cr) .... Take 1 Tablet By Mouth Once A Day 4)  Simvastatin 20 Mg  Tabs (Simvastatin) .... Take One Tablet By Mouth At Bedtime 5)  Synthroid 75 Mcg  Tabs (Levothyroxine Sodium) .Marland Kitchen.. 1 Tab Daily.Marland KitchenMarland Kitchen 6)  Nexium 40 Mg Cpdr (Esomeprazole Magnesium) .... Take 1 Capsule By Mouth Once A Day Jarold Motto Holding) 7)  Carafate 1 Gm/40ml  Susp (Sucralfate) .... Take 10 Cc By Mouth As Needed 8)  Levsin 0.125 Mg  Tabs (Hyoscyamine Sulfate) .... Take One By Mouth Every 4 Hours As Needed 9)  Librax 2.5-5 Mg Caps (Clidinium-Chlordiazepoxide) .Marland Kitchen.. 1 By Mouth Three Times A Day  Prn 10)  Pancreaze 60454 Unit Cpep (Pancrelipase (Lip-Prot-Amyl)) .... Three Times A Day 11)  Align   Caps (Misc Intestinal Flora Regulat) .... Take One Capsule By Mouth Daily 12)  Premarin 0.625 Mg  Tabs (Estrogens Conjugated) .... Take 1 Tablet By Mouth Once A Day 13)  Celebrex 200 Mg Caps (Celecoxib) .Marland Kitchen.. 1 Tab By Mouth Once Daily As Needed For Arthritis Pain... 14)  Midrin 325-65-100 Mg  Caps (Apap-Isometheptene-Dichloral) .... Use As Needed For Headaches 15)   Lidoderm 5 % Ptch (Lidocaine) .... Use As Needed 16)  Skelaxin 800 Mg  Tabs (Metaxalone) .... Take 1 Tablet By Mouth Two Times A Day As Needed For Muscle Spasm... 17)  Vitamin D 09811 Unit  Caps (Ergocalciferol) .... Take One Tablet By Mouth Once A Week  Current Medications (verified): 1)  Nasonex 50 Mcg/act Susp (Mometasone Furoate) .... 2 Sprays in Each Nostril Daily 2)  Hydrochlorothiazide 25 Mg Tabs (Hydrochlorothiazide) .... Take 1 Tablet By Mouth Once A Day 3)  Klor-Con M20 20 Meq Tbcr (Potassium Chloride Crys Cr) .... Take 1 Tablet By Mouth Once A Day 4)  Simvastatin 20 Mg  Tabs (Simvastatin) .... Take One Tablet By Mouth At Bedtime 5)  Synthroid 75 Mcg  Tabs (Levothyroxine Sodium) .Marland Kitchen.. 1 Tab Daily.Marland KitchenMarland Kitchen 6)  Nexium 40 Mg Cpdr (Esomeprazole Magnesium) .... Take 1 Capsule By Mouth Once A Day Jarold [employer]) 7)  Carafate 1 Gm/27ml  Susp (Sucralfate) .... Take 10 Cc By Mouth As Needed 8)  Levsin 0.125 Mg  Tabs (Hyoscyamine Sulfate) .... Take One By Mouth Every 4 Hours As Needed 9)  Librax 2.5-5 Mg Caps (Clidinium-Chlordiazepoxide) .Marland Kitchen.. 1 By Mouth Three Times A Day Prn 10)  Pancreaze 91478 Unit Cpep (Pancrelipase (Lip-Prot-Amyl)) .... Three Times A Day 11)  Align   Caps (Misc Intestinal Flora Regulat) .... Take One Capsule By Mouth Daily 12)  Premarin 0.625 Mg  Tabs (Estrogens Conjugated) .... Take 1 Tablet By Mouth Once A Day 13)  Celebrex 200 Mg Caps (Celecoxib) .Marland Kitchen.. 1 Tab By Mouth Once Daily As Needed For Arthritis Pain... 14)  Midrin 325-65-100 Mg  Caps (Apap-Isometheptene-Dichloral) .... Use As Needed For Headaches 15)  Lidoderm 5 % Ptch (Lidocaine) .... Use As Needed 16)  Skelaxin 800 Mg  Tabs (Metaxalone) .... Take 1 Tablet By Mouth Two Times A Day As Needed For Muscle Spasm... 17)  Vitamin D 29562 Unit  Caps (Ergocalciferol) .... Take One Tablet By Mouth Once A Week  Allergies (verified): 1)  ! Tetracycline 2)  ! Codeine  Past History:  Past Medical History: Last updated:  07/20/2009  HYPERTENSION (ICD-401.9) CHEST PAIN, ATYPICAL (ICD-786.59) CAROTID BRUIT (ICD-785.9) HYPERCHOLESTEROLEMIA (ICD-272.0) HYPOTHYROIDISM (ICD-244.9) HIATAL HERNIA (ICD-553.3) GERD (ICD-530.81) DIVERTICULAR DISEASE (ICD-562.10) IRRITABLE BOWEL SYNDROME, HX OF (ICD-V12.79) COLONIC POLYPS (ICD-211.3) OSTEOARTHRITIS (ICD-715.90) FIBROMYALGIA (ICD-729.1) VITAMIN D DEFICIENCY (ICD-268.9) MIGRAINE HEADACHE (ICD-346.90) ANXIETY (ICD-300.00) ACNE ROSACEA (ICD-695.3)  Family History: Last updated: 04-28-2008 Mother deceased at age 6 due to heart problems Father alive at age 49 with significat medical history of hypertension Sister alive at age 96, with significant medical history of hypertension, and DM Second sister alive at age 35 with significant medical history of hypertension Family History of Colon Cancer: Maternal Aunt Family History of Stomach Cancer: Maternal Aunt Family History of Colon Polyps: Father, Sister  Social History: Last updated: 04-28-08 Patient is a retired Production designer, theatre/television/film Patient states never smoked Patient is married with 2 children Alcohol Use - no Patient does not get regular exercise.   Risk Factors: Caffeine Use:  0 (03/30/2008) Exercise: no (04/06/2008)  Risk Factors: Smoking Status: never (03/13/2008) Passive Smoke Exposure: no (03/30/2008)  Review of Systems      See HPI  Vital Signs:  Patient profile:   65 year old female Height:      62 inches Weight:      159 pounds BMI:     29.19 O2 Sat:      99 % on Room air Temp:     97.5 degrees F oral Pulse rate:   99 / minute BP sitting:   134 / 84  (left arm) Cuff size:   regular  Vitals Entered By: Boone Master CNA (Aug 24, 2009 10:03 AM)  O2 Flow:  Room air CC: pt states she has sinus pressure/congestion, no drianage, pressure in both ears x2days Is Patient Diabetic? No Comments Medications reviewed with patient Daytime contact number verified with patient. Boone Master CNA   Aug 24, 2009 10:03 AM    Physical Exam  Additional Exam:  WD, WN, 65  y/o BF in NAD...  GENERAL:  Alert & oriented; pleasant & cooperative... HEENT:  [place]/AT,  , EACs-clear, TMs-wnl, NOSE- pale w/ clear discharge THROAT-clear & wnl. NECK:  Supple w/ fair ROM; no JVD; sl decr carotid impulses w/ bilat L>R bruits ; no thyromegaly or nodules palpated; no lymphadenopathy. CHEST:  Clear to P & A; without wheezes/ rales/ or rhonchi heard... HEART:  Regular Rhythm; without murmurs/ rubs/ or gallops detected... ABDOMEN:  Soft & nontender; normal bowel sounds; no organomegaly or masses detected. EXT: without deformities or arthritic changes; no varicose veins/ venous insuffic/ or edema.     Impression & Recommendations:  Problem # 1:  ATOPIC RHINITIS (ICD-477.9)  Flare  REC:  Saline nasal rinses as needed  Zyrtec 10mg  at bedtime as needed drainage Restart Nasonex 2 puffs two times a day  Mucinex two times a day as needed congestion Please contact office for sooner follow up if symptoms do not improve or worsen   Her updated medication list for this problem includes:    Nasonex 50 Mcg/act Susp (Mometasone furoate) .Marland Kitchen... 2 sprays in each nostril daily  Orders: Est. Patient Level III (19147)  Complete Medication List: 1)  Nasonex 50 Mcg/act Susp (Mometasone furoate) .... 2 sprays in each nostril daily 2)  Hydrochlorothiazide 25 Mg Tabs (Hydrochlorothiazide) .... Take 1 tablet by mouth once a day 3)  Klor-con M20 20 Meq Tbcr (Potassium chloride crys cr) .... Take 1 tablet by mouth once a day 4)  Simvastatin 20 Mg Tabs (Simvastatin) .... Take one tablet by mouth at bedtime 5)  Synthroid 75 Mcg Tabs (Levothyroxine sodium) .Marland Kitchen.. 1 tab daily.Marland KitchenMarland Kitchen 6)  Nexium 40 Mg Cpdr (Esomeprazole magnesium) .... Take 1 capsule by mouth once a day (patterson holding) 7)  Carafate 1 Gm/20ml Susp (Sucralfate) .... Take 10 cc by mouth as needed 8)  Levsin 0.125 Mg Tabs (Hyoscyamine sulfate) .... Take one by mouth  every 4 hours as needed 9)  Librax 2.5-5 Mg Caps (Clidinium-chlordiazepoxide) .Marland Kitchen.. 1 by mouth three times a day prn 10)  Pancreaze 82956 Unit Cpep (Pancrelipase (lip-prot-amyl)) .... Three times a day 11)  Align Caps (Misc intestinal flora regulat) .... Take one capsule by mouth daily 12)  Premarin 0.625 Mg Tabs (Estrogens conjugated) .... Take 1 tablet by mouth once a day 13)  Celebrex 200 Mg Caps (Celecoxib) .Marland Kitchen.. 1 tab by mouth once daily as needed for arthritis pain... 14)  Midrin 325-65-100 Mg Caps (Apap-isometheptene-dichloral) .... Use as needed for  headaches 15)  Lidoderm 5 % Ptch (Lidocaine) .... Use as needed 16)  Skelaxin 800 Mg Tabs (Metaxalone) .... Take 1 tablet by mouth two times a day as needed for muscle spasm... 17)  Vitamin D 98119 Unit Caps (Ergocalciferol) .... Take one tablet by mouth once a week  Patient Instructions: 1)  Saline nasal rinses as needed  2)  Zyrtec 10mg  at bedtime as needed drainage 3)  Restart Nasonex 2 puffs two times a day  4)  Mucinex two times a day as needed congestion 5)  Please contact office for sooner follow up if symptoms do not improve or worsen

## 2010-05-14 NOTE — Letter (Signed)
Summary: Health Exam Certificate/[employer]  Health Exam Certificate/[employer]   Imported By: Sherian Rein 06/06/2009 12:21:08  _____________________________________________________________________  External Attachment:    Type:   Image     Comment:   External Document

## 2010-05-14 NOTE — Letter (Signed)
Summary: Patient [facility] Biopsy Results  [facility]  [address] [place], [place] [address]   Phone: [phone]  Fax: [phone]        December 28, 2009 MRN: [id number]    [facility] [address] [place], [place]  [address]    Dear Ms. Goll,  I am pleased to inform you that the biopsies taken during your recent endoscopic examination did not show any evidence of cancer upon pathologic examination.  Additional information/recommendations:  __No further action is needed at this time.  Please follow-up with      your primary care physician for your other healthcare needs.  __ Please call [phone] to schedule a return visit to review      your condition.  _x_ Continue with the treatment plan as outlined on the day of your      exam.My Nurse will call and schedule esophageal manometry and 24-hour pH probe testing. You will need to be off of acid suppression for 72 hours before these tests are completed. Esophageal biopsies were non-revealing.  __ You should have a repeat endoscopic examination for this problem              in _ months/years.   Please call us if you are having persistent problems or have questions about your condition that have not been fully answered at this time.  Sincerely,  Mardella Layman MD [facility]  This letter has been electronically signed by your physician.  Appended Document: Patient Notice-Endo Biopsy Results letter mailed

## 2010-05-14 NOTE — Miscellaneous (Signed)
Summary: [facility] previsit-prep  Clinical Lists Changes  Medications: Added new medication of MOVIPREP 100 GM  SOLR (PEG-KCL-NACL-NASULF-NA ASC-C) As per prep instructions. - Signed Rx of MOVIPREP 100 GM  SOLR (PEG-KCL-NACL-NASULF-NA ASC-C) As per prep instructions.;  #1 x 0;  Signed;  Entered by: Doristine Church RN II;  Authorized by: Mardella Layman MD [facility];  Method used: [facility]  [address] [id number] [address] [place], [place]  [address], Ph: [phone] or [phone], Fax: [phone] Observations: Added new observation of ALLERGY REV: Done (05/18/2009 14:32)    Prescriptions: MOVIPREP 100 GM  SOLR (PEG-KCL-NACL-NASULF-NA ASC-C) As per prep instructions.  #1 x 0   Entered by:   Doristine Church RN II   Authorized by:   Mardella Layman MD [facility]   Signed by:   Doristine Church RN II on 05/18/2009   Method used:   [facility]        [facility]  [address] [id number]       [address]       [place], [place]  [address]       Ph: [phone] or [phone]       Fax: [phone]   RxID:   [id number]

## 2010-05-14 NOTE — Miscellaneous (Signed)
Summary: Orders Update  Clinical Lists Changes  Problems: Added new problem of OTHER SPECIFIED DISORDERS OF ARTERIES&ARTERIOLES (ICD-447.8) Orders: Added new Test order of Renal Artery Duplex (Renal Artery Duplex) - Signed

## 2010-05-14 NOTE — Letter (Signed)
Summary: [facility] Instructions  [facility]  [address] [place], [place] [address]   Phone: [phone]  Fax: [phone]       ABBIE [name]    1945/11/07    MRN: [id number]        Procedure Day Dorna Bloom:  Wednesday 05/30/2009     Arrival Time: 7:30 am      Procedure Time: 8:30 am     Location of Procedure:                    _x _  [facility] (4th Floor)  PREPARATION FOR COLONOSCOPY WITH MOVIPREP   Starting 5 days prior to your procedure Friday 2/11 do not eat nuts, seeds, popcorn, corn, beans, peas,  salads, or any raw vegetables.  Do not take any fiber supplements (e.g. Metamucil, Citrucel, and Benefiber).  THE DAY BEFORE YOUR PROCEDURE         DATE: Tuesday 2/15  1.  Drink clear liquids the entire day-NO SOLID FOOD  2.  Do not drink anything colored red or purple.  Avoid juices with pulp.  No orange juice.  3.  Drink at least 64 oz. (8 glasses) of fluid/clear liquids during the day to prevent dehydration and help the prep work efficiently.  CLEAR LIQUIDS INCLUDE: Water Jello Ice Popsicles Tea (sugar ok, no milk/cream) Powdered fruit flavored drinks Coffee (sugar ok, no milk/cream) Gatorade Juice: apple, white grape, white cranberry  Lemonade Clear bullion, consomm, broth Carbonated beverages (any kind) Strained chicken noodle soup Hard Candy                             4.  In the morning, mix first dose of MoviPrep solution:    Empty 1 Pouch A and 1 Pouch B into the disposable container    Add lukewarm drinking water to the top line of the container. Mix to dissolve    Refrigerate (mixed solution should be used within 24 hrs)  5.  Begin drinking the prep at 5:00 p.m. The MoviPrep container is divided by 4 marks.   Every 15 minutes drink the solution down to the next mark (approximately 8 oz) until the full liter is complete.   6.  Follow completed prep with 16 oz of clear liquid of your choice (Nothing red or purple).   Continue to drink clear liquids until bedtime.  7.  Before going to bed, mix second dose of MoviPrep solution:    Empty 1 Pouch A and 1 Pouch B into the disposable container    Add lukewarm drinking water to the top line of the container. Mix to dissolve    Refrigerate  THE DAY OF YOUR PROCEDURE      DATE: Wednesday 2/16  Beginning at 3:30 am (5 hours before procedure):         1. Every 15 minutes, drink the solution down to the next mark (approx 8 oz) until the full liter is complete.  2. Follow completed prep with 16 oz. of clear liquid of your choice.    3. You may drink clear liquids until 6:30 am (2 HOURS BEFORE PROCEDURE).   MEDICATION INSTRUCTIONS  Unless otherwise instructed, you should take regular prescription medications with a small sip of water   as early as possible the morning of your procedure.    Additional medication instructions: _Hold HCTZ the AM of procedure.  OTHER INSTRUCTIONS  You will need a responsible adult at least 64 years of age to accompany you and drive you home.   This person must remain in the waiting room during your procedure.  Wear loose fitting clothing that is easily removed.  Leave jewelry and other valuables at home.  However, you may wish to bring a book to read or  an iPod/MP3 player to listen to music as you wait for your procedure to start.  Remove all body piercing jewelry and leave at home.  Total time from sign-in until discharge is approximately 2-3 hours.  You should go home directly after your procedure and rest.  You can resume normal activities the  day after your procedure.  The day of your procedure you should not:   Drive   Make legal decisions   Operate machinery   Drink alcohol   Return to work  You will receive specific instructions about eating, activities and medications before you leave.    The above instructions have been reviewed and explained to me by   Doristine Church RN II   May 18, 2009 3:14 PM     I fully understand and can verbalize these instructions _____________________________ Date _________

## 2010-05-16 NOTE — Progress Notes (Signed)
Summary: New Nexium prescription   Phone Note Call from Patient Call back at [phone] CELL   Call For: Dr Jarold Motto Reason for Call: Refill Medication Summary of Call: Wants to speak to nurse about Nexium. There is a new plan where she can get a discount. So although her current prescription has not run out yet, [facility] [address] [facility] she would require a new prescription be sent to be able to get the discount. Wonders if she can get a call if Dr would be willing to help her out. Initial call taken by: Leanor Kail [facility],  April 19, 2010 8:59 AM  Follow-up for Phone Call        new rx sent.  Follow-up by: Harlow Mares CMA (AAMA),  April 19, 2010 9:42 AM    Prescriptions: NEXIUM 40 MG CPDR (ESOMEPRAZOLE MAGNESIUM) Take 1 capsule by mouth once a day  #30 x 6   Entered by:   Harlow Mares CMA (AAMA)   Authorized by:   Mardella Layman MD [facility]   Signed by:   Harlow Mares CMA (AAMA) on 04/19/2010   Method used:   [facility]  [address] [id number]       [address]       [place], [place]  [address]       Ph: [phone] or [phone]       Fax: [phone]   RxID:   [id number]

## 2010-05-21 ENCOUNTER — Ambulatory Visit (INDEPENDENT_AMBULATORY_CARE_PROVIDER_SITE_OTHER): Payer: BC Managed Care – PPO

## 2010-05-21 ENCOUNTER — Encounter: Payer: Self-pay | Admitting: Pulmonary Disease

## 2010-05-21 DIAGNOSIS — Z23 Encounter for immunization: Secondary | ICD-10-CM

## 2010-05-25 ENCOUNTER — Encounter: Payer: Self-pay | Admitting: Pulmonary Disease

## 2010-05-30 NOTE — Assessment & Plan Note (Signed)
Summary: flu injection//sh   Nurse Visit   Allergies: 1)  ! Tetracycline 2)  ! Codeine  Immunizations Administered:  Influenza Vaccine # 1:    Vaccine Type: Fluvax 3+    Site: left deltoid    Mfr: GlaxoSmithKline    Dose: 0.5 ml    Route: IM    Given by: Tammy Harneet Noblett    Exp. Date: 10/12/2010    Lot #: [id number]  Flu Vaccine Consent Questions:    Do you have a history of severe allergic reactions to this vaccine? no    Any prior history of allergic reactions to egg and/or gelatin? no    Do you have a sensitivity to the preservative Thimersol? no    Do you have a past history of Guillan-Barre Syndrome? no    Do you currently have an acute febrile illness? no    Have you ever had a severe reaction to latex? no    Vaccine information given and explained to patient? yes    Are you currently pregnant? no  Orders Added: 1)  Flu Vaccine 59yrs + [90658] 2)  Admin 1st Vaccine [78469]

## 2010-06-04 ENCOUNTER — Telehealth (INDEPENDENT_AMBULATORY_CARE_PROVIDER_SITE_OTHER): Payer: Self-pay | Admitting: *Deleted

## 2010-06-05 ENCOUNTER — Encounter: Payer: Self-pay | Admitting: Pulmonary Disease

## 2010-06-10 ENCOUNTER — Encounter: Payer: Self-pay | Admitting: Cardiology

## 2010-06-10 ENCOUNTER — Ambulatory Visit (INDEPENDENT_AMBULATORY_CARE_PROVIDER_SITE_OTHER): Payer: BC Managed Care – PPO | Admitting: Cardiology

## 2010-06-10 DIAGNOSIS — R002 Palpitations: Secondary | ICD-10-CM

## 2010-06-11 NOTE — Medication Information (Signed)
Summary: Tax adviser   Imported By: Valinda Hoar 06/05/2010 10:04:58  _____________________________________________________________________  External Attachment:    Type:   Image     Comment:   External Document

## 2010-06-11 NOTE — Progress Notes (Signed)
Summary: PA for Celebrex<<<APPROVED from 05/14/2010 to 06/04/2011  Phone Note Outgoing Call   Call placed by: Michel Bickers CMA,  June 04, 2010 9:29 AM Call placed to: [facility] [phone] Summary of Call: PA initiated for Celebrrex 200mg  caps.  Member ID # [id number].  Case # is [id number].  No covered alternatives per insurance.  Awaiting fax from [employer]. Initial call taken by: Michel Bickers CMA,  June 04, 2010 9:31 AM  Follow-up for Phone Call        PA form received and placed on SN cart for signature and review.Michel Bickers [facility]  June 04, 2010 9:57 AM   form has been signed by [facility] and faxed back---placed in triage awaiting approval [facility]  June 04, 2010 2:48 PM   Additional Follow-up for Phone Call Additional follow up Details #1::        Celebrex has been APPROVED from 05/14/2010 to 06/04/2011.  Patient and pharmacy are aware.Michel Bickers [facility]  June 05, 2010 9:13 AM

## 2010-06-20 NOTE — Assessment & Plan Note (Signed)
Summary: f27m per check out on [email or web address] appt [email or web address]  Medications Added FIBERCON 625 MG TABS (CALCIUM POLYCARBOPHIL) 1 tab by mouth once daily LIBRAX 2.5-5 MG CAPS (CLIDINIUM-CHLORDIAZEPOXIDE) as needed CHLORZOXAZONE 500 MG TABS (CHLORZOXAZONE) as needed OPTIVE 0.5-0.9 % SOLN (CARBOXYMETHYLCELLUL-GLYCERIN) as needed        Referring Provider:  N/A Primary Provider:  Alroy Dust, MD  CC:  palpitations and sob pt complains of.  History of Present Illness: 65 year old female I saw in Sept 2010for evaluation of chest pain. Admitted in Sept 2011 with chest pain and echocardiogram showed normal LV function. There was mild mitral regurgitation. Cardiac markers, d-dimer, liver functions were normal. The BNP less than 30. TSH normal. Abdominal ultrasound in September of 2011 was normal. Also with h/o palpitations. A stress echocardiogram was performed in September of 2011 and revealed normal LV function and no stress-induced wall motion abnormalities. A CardioNet was performed and revealed sinus rhythm with PVCs. Palpitations treated with beta blocker. Since I last saw her in Oct of 2011, her palpitations have improved. However recently they increased in severity. He notices these predominantly at night. They are not sustained. She otherwise does not have dyspnea on exertion or chest pain and she exercises routinely. She has not had syncope.  Current Medications (verified): 1)  Nasonex 50 Mcg/act Susp (Mometasone Furoate) .... 2 Sprays in Each Nostril Daily 2)  Aspirin 81 Mg Tbec (Aspirin) .Marland Kitchen.. 1 Once Daily 3)  Metoprolol Tartrate 25 Mg Tabs (Metoprolol Tartrate) .Marland Kitchen.. 1 Two Times A Day 4)  Hydrochlorothiazide 25 Mg Tabs (Hydrochlorothiazide) .... Take 1 Tablet By Mouth Once A Day 5)  Klor-Con M20 20 Meq Tbcr (Potassium Chloride Crys Cr) .... Take 1 Tablet By Mouth Once A Day 6)  Synthroid 75 Mcg  Tabs (Levothyroxine Sodium) .Marland Kitchen.. 1 Tab Daily.Marland KitchenMarland Kitchen 7)  Nexium 40 Mg Cpdr (Esomeprazole Magnesium)  .... Take 1 Capsule By Mouth Once A Day 8)  Carafate 1 Gm/58ml  Susp (Sucralfate) .... Take 10 Cc By Mouth As Needed 9)  Levsin 0.125 Mg  Tabs (Hyoscyamine Sulfate) .... Take One By Mouth Every 4 Hours As Needed 10)  Pancreaze 82956 Unit Cpep (Pancrelipase (Lip-Prot-Amyl)) .... Three Times A Day 11)  Align   Caps (Misc Intestinal Flora Regulat) .... Take One Capsule By Mouth Daily 12)  Premarin 0.625 Mg  Tabs (Estrogens Conjugated) .... Take 1 Tablet By Mouth Once A Day 13)  Hydrocodone-Acetaminophen 5-500 Mg Tabs (Hydrocodone-Acetaminophen) .Marland Kitchen.. 1 Tab Q 4-6 Hrs As Needed For Pain 14)  Celebrex 200 Mg Caps (Celecoxib) .Marland Kitchen.. 1 Tab By Mouth Once Daily As Needed For Arthritis Pain... 15)  Lidoderm 5 % Ptch (Lidocaine) .... Use As Needed 16)  Vitamin D 21308 Unit  Caps (Ergocalciferol) .... Take One Tablet By Mouth Once A Week 17)  Clonazepam 0.5 Mg Tabs (Clonazepam) .Marland Kitchen.. 1 Tab By Mouth Two Times A Day 18)  Ovace Cream .... As Needed 19)  Fibercon 625 Mg Tabs (Calcium Polycarbophil) .Marland Kitchen.. 1 Tab By Mouth Once Daily 20)  Librax 2.5-5 Mg Caps (Clidinium-Chlordiazepoxide) .... As Needed 21)  Chlorzoxazone 500 Mg Tabs (Chlorzoxazone) .... As Needed 22)  Optive 0.5-0.9 % Soln (Carboxymethylcellul-Glycerin) .... As Needed  Allergies: 1)  ! Tetracycline 2)  ! Codeine  Past History:  Past Medical History: HYPERTENSION (ICD-401.9) HYPERLIPIDEMIA (ICD-272.4) HYPOTHYROIDISM (ICD-244.9) HIATAL HERNIA (ICD-553.3) GERD (ICD-530.81) DIVERTICULAR DISEASE (ICD-562.10) IRRITABLE BOWEL SYNDROME, HX OF (ICD-V12.79) COLONIC POLYPS (ICD-211.3) OSTEOARTHRITIS (ICD-715.90) FIBROMYALGIA (ICD-729.1) VITAMIN D DEFICIENCY (ICD-268.9) MIGRAINE HEADACHE (ICD-346.90) ANXIETY (ICD-300.00) ACNE ROSACEA (ICD-695.3)  Past Surgical History: Reviewed history from 03/21/2010 and no changes required. Cholecystectomy Hysterectomy C-section Right foot Chevron osteotomy  Social History: Reviewed history from  12/24/2009 and no changes required. Patient is a retired Production designer, theatre/television/film Patient states never smoked Patient is married with 2 children Alcohol Use - no Patient does not get regular exercise  Review of Systems       no fevers or chills, productive cough, hemoptysis, dysphasia, odynophagia, melena, hematochezia, dysuria, hematuria, rash, seizure activity, orthopnea, PND, pedal edema, claudication. Remaining systems are negative.   Vital Signs:  Patient profile:   65 year old female Height:      62 inches Weight:      160 pounds BMI:     29.37 Pulse rate:   76 / minute Resp:     12 per minute BP sitting:   115 / 70  (left arm)  Vitals Entered By: Kem Parkinson (June 10, 2010 10:33 AM)  Physical Exam  General:  Well-developed well-nourished in no acute distress.  Skin is warm and dry.  HEENT is normal.  Neck is supple. No thyromegaly.  Chest is clear to auscultation with normal expansion.  Cardiovascular exam is regular rate and rhythm.  Abdominal exam nontender or distended. No masses palpated. Extremities show no edema. neuro grossly intact     EKG  Procedure date:  06/10/2010  Findings:      Sinus rhythm, no ST changes.  Impression & Recommendations:  Problem # 1:  PALPITATIONS (ICD-785.1) The are most likely PVCs. I have asked her to take an additional 25 mg of metoprolol q.h.s. p.r.n. LV function is normal. Her updated medication list for this problem includes:    Aspirin 81 Mg Tbec (Aspirin) .Marland Kitchen... 1 once daily    Metoprolol Tartrate 25 Mg Tabs (Metoprolol tartrate) .Marland Kitchen... 1 two times a day  Her updated medication list for this problem includes:    Aspirin 81 Mg Tbec (Aspirin) .Marland Kitchen... 1 once daily    Metoprolol Tartrate 25 Mg Tabs (Metoprolol tartrate) .Marland Kitchen... 1 two times a day  Problem # 2:  HYPERTENSION (ICD-401.9) Blood pressure controlled. Continue present medications. Her updated medication list for this problem includes:    Aspirin 81 Mg Tbec  (Aspirin) .Marland Kitchen... 1 once daily    Metoprolol Tartrate 25 Mg Tabs (Metoprolol tartrate) .Marland Kitchen... 1 two times a day    Hydrochlorothiazide 25 Mg Tabs (Hydrochlorothiazide) .Marland Kitchen... Take 1 tablet by mouth once a day  Problem # 3:  HYPERLIPIDEMIA (ICD-272.4) Management per primary care.  Problem # 4:  HYPOTHYROIDISM (ICD-244.9)  Her updated medication list for this problem includes:    Synthroid 75 Mcg Tabs (Levothyroxine sodium) .Marland Kitchen... 1 tab daily...  Problem # 5:  IRRITABLE BOWEL SYNDROME, HX OF (ICD-V12.79)  Problem # 6:  FIBROMYALGIA (ICD-729.1)  Patient Instructions: 1)  Your physician has recommended you make the following change in your medication: MAY TAKE AN EXTRA METOPROLOL AS NEEDED FOR PALPITATIONS 2)  Your physician wants you to follow-up in:  6 MONTHS  You will receive a reminder letter in the mail two months in advance. If you don't receive a letter, please call our office to schedule the follow-up appointment.

## 2010-06-20 NOTE — Letter (Signed)
Summary: Velna Ochs MD  Velna Ochs MD   Imported By: Lester  06/11/2010 09:35:15  _____________________________________________________________________  External Attachment:    Type:   Image     Comment:   External Document

## 2010-06-25 DIAGNOSIS — Z860100 Personal history of colon polyps, unspecified: Secondary | ICD-10-CM

## 2010-06-27 LAB — POCT CARDIAC MARKERS: Troponin i, poc: <0.05 ng/mL (ref 0.00–0.09)

## 2010-06-27 LAB — TSH: TSH: 0.909 u[IU]/mL (ref 0.350–4.500)

## 2010-06-27 LAB — PHOSPHORUS: Phosphorus: 2.3 mg/dL (ref 2.3–4.6)

## 2010-06-27 LAB — MAGNESIUM
Magnesium: 1.6 mg/dL (ref 1.5–2.5)
Magnesium: 1.6 mg/dL (ref 1.5–2.5)

## 2010-06-27 LAB — COMPREHENSIVE METABOLIC PANEL
ALT: 18 U/L (ref 0–35)
AST: 19 U/L (ref 0–37)
Alkaline Phosphatase: 48 U/L (ref 39–117)
CO2: 25 mEq/L (ref 19–32)
Calcium: 9.1 mg/dL (ref 8.4–10.5)
GFR calc Af Amer: >60 mL/min (ref >60)
Potassium: 3.2 mEq/L — ABNORMAL LOW (ref 3.5–5.1)
Sodium: 139 mEq/L (ref 135–145)
Total Protein: 6.5 g/dL (ref 6.0–8.3)

## 2010-06-27 LAB — CK TOTAL AND CKMB (NOT AT ARMC)
CK, MB: 2.4 ng/mL (ref 0.3–4.0)
Relative Index: 1.7 (ref 0.0–2.5)
Total CK: 138 U/L (ref 7–177)

## 2010-06-27 LAB — CBC
Hemoglobin: 12.4 g/dL (ref 12.0–15.0)
MCHC: 34.7 g/dL (ref 30.0–36.0)
MCV: 89.5 fL (ref 78.0–100.0)
Platelets: 297 10*3/uL (ref 150–400)
RBC: 3.92 MIL/uL (ref 3.87–5.11)
RDW: 14 % (ref 11.5–15.5)
WBC: 6.8 10*3/uL (ref 4.0–10.5)
WBC: 7.3 10*3/uL (ref 4.0–10.5)

## 2010-06-27 LAB — DIFFERENTIAL
Eosinophils Absolute: 0.2 10*3/uL (ref 0.0–0.7)
Eosinophils Relative: 3 % (ref 0–5)
Lymphs Abs: 4 10*3/uL (ref 0.7–4.0)
Monocytes Relative: 11 % (ref 3–12)

## 2010-06-27 LAB — PROTIME-INR
INR: 0.94 (ref 0.00–1.49)
Prothrombin Time: 12.8 seconds (ref 11.6–15.2)

## 2010-06-27 LAB — BASIC METABOLIC PANEL
Chloride: 108 mEq/L (ref 96–112)
Creatinine, Ser: 0.84 mg/dL (ref 0.4–1.2)
GFR calc Af Amer: >60 mL/min (ref >60)
Potassium: 4.1 mEq/L (ref 3.5–5.1)

## 2010-06-27 LAB — LIPID PANEL
Triglycerides: 204 mg/dL — ABNORMAL HIGH (ref <150)
VLDL: 41 mg/dL — ABNORMAL HIGH (ref 0–40)

## 2010-06-27 LAB — TROPONIN I: Troponin I: 0.03 ng/mL (ref 0.00–0.06)

## 2010-07-04 ENCOUNTER — Encounter: Payer: Self-pay | Admitting: Gastroenterology

## 2010-07-04 ENCOUNTER — Ambulatory Visit (INDEPENDENT_AMBULATORY_CARE_PROVIDER_SITE_OTHER): Payer: BC Managed Care – PPO | Admitting: Gastroenterology

## 2010-07-04 VITALS — BP 112/66 | HR 68 | Ht 62.0 in | Wt 159.0 lb

## 2010-07-04 DIAGNOSIS — K3184 Gastroparesis: Secondary | ICD-10-CM

## 2010-07-04 NOTE — Patient Instructions (Addendum)
We have scheduled you for a gastric empty scan at [facility] on 07/24/2010 at 8am please arrive 15 min early and do not eat or drink anything after midnight Do not take your Carafate or Nexium after midnight.

## 2010-07-04 NOTE — Progress Notes (Signed)
History of Present Illness:  This is a  Very complex patient with multiple GI issues. Despite treatment she continues to have gas, bloating, and regurgitation. She also complains of early satiety but denies anorexia or weight loss. The patient been treated for H. Pylori infection successfully. She is up-to-date on her endoscopies and colonoscopy exams.     ROS: The remainder of the 8 point ROS is negative  Past Medical History  Diagnosis Date  . Hypertension   . Hyperlipidemia   . Hypothyroidism   . Hiatal hernia   . GERD (gastroesophageal reflux disease)   . Diverticulosis   . IBS (irritable bowel syndrome)   . Colon polyps   . Osteoarthritis   . Fibromyalgia   . Vitamin D deficiency   . Migraine   . Anxiety   . Acne rosacea    Past Surgical History  Procedure Date  . Cholecystectomy 1985  . Vaginal hysterectomy 1993  . Cesarean section   . Foot osteotomy 2006    Right foot  . Tubal ligation 1983    reports that she has never smoked. She does not have any smokeless tobacco history on file. She reports that she does not drink alcohol or use illicit drugs. family history includes Colon cancer in her maternal aunt; Colon polyps in her father and sister; Diabetes in her sister; Hypertension in her father and sister; Mitral valve prolapse in her mother; and Stomach cancer in her maternal aunt. Allergies  Allergen Reactions  . Codeine     REACTION: GI upset/vomit  . Tetracycline     REACTION: GI bleed        Physical Exam: General well developed well nourished patient in no acute distress, appearing their stated age Eyes PERRLA, no icterus fundoscopic exam per opthamologist Skin no lesions noted   Abdomen no hepatosplenomegaly masses or tenderness, BS normal.   assessment and plan: probable gastroparesis with mild bacterial overgrowth syndrome and secondary regurgitation of ascitic and bills material she does have chronic IBS. We will continue current medications and  check gastric emptying scan. If this shows delayed emptying , we will treat her with domperidone 10 mg before meals and at bedtime. We will call her once these results are available.  Extremities no acute joint lesions, edema, phlebitis or evidence of cellulitis. Neurologic patient oriented x 3, cranial nerves intact, no focal neurologic deficits noted. Psychological mental status normal and normal affect.

## 2010-07-06 ENCOUNTER — Other Ambulatory Visit: Payer: Self-pay | Admitting: Gastroenterology

## 2010-07-18 ENCOUNTER — Other Ambulatory Visit: Payer: Self-pay | Admitting: Gastroenterology

## 2010-07-24 ENCOUNTER — Ambulatory Visit (HOSPITAL_COMMUNITY)
Admission: RE | Admit: 2010-07-24 | Discharge: 2010-07-24 | Disposition: A | Discharge status: Home / Self Care | Payer: BC Managed Care – PPO | Source: Ambulatory Visit | Attending: Gastroenterology | Admitting: Gastroenterology

## 2010-07-24 ENCOUNTER — Telehealth: Payer: Self-pay | Admitting: *Deleted

## 2010-07-24 ENCOUNTER — Encounter (HOSPITAL_COMMUNITY): Payer: Self-pay

## 2010-07-24 DIAGNOSIS — K3189 Other diseases of stomach and duodenum: Secondary | ICD-10-CM | POA: Insufficient documentation

## 2010-07-24 DIAGNOSIS — R142 Eructation: Secondary | ICD-10-CM | POA: Insufficient documentation

## 2010-07-24 DIAGNOSIS — K3184 Gastroparesis: Secondary | ICD-10-CM

## 2010-07-24 DIAGNOSIS — R143 Flatulence: Secondary | ICD-10-CM | POA: Insufficient documentation

## 2010-07-24 DIAGNOSIS — R109 Unspecified abdominal pain: Secondary | ICD-10-CM | POA: Insufficient documentation

## 2010-07-24 DIAGNOSIS — R141 Gas pain: Secondary | ICD-10-CM | POA: Insufficient documentation

## 2010-07-24 MED ORDER — TECHNETIUM TC 99M SULFUR COLLOID
1.9000 | Freq: Once | INTRAVENOUS | Status: AC | PRN
  Administered 2010-07-24: 1.9 via ORAL

## 2010-07-24 NOTE — Telephone Encounter (Signed)
Message copied by Harlow Mares on Wed Jul 24, 2010  2:46 PM ------      Message from: Jarold Motto, DAVID      Created: Wed Jul 24, 2010  2:43 PM       rx domperidome 10 mg ac and qhs.Marland KitchenMarland Kitchen

## 2010-07-26 MED ORDER — AMBULATORY NON FORMULARY MEDICATION: Status: DC

## 2010-07-26 NOTE — Telephone Encounter (Signed)
Patient aware she will make an appt to come back on one month rx sent.

## 2010-07-30 ENCOUNTER — Other Ambulatory Visit: Payer: Self-pay | Admitting: Gastroenterology

## 2010-08-05 ENCOUNTER — Other Ambulatory Visit: Payer: Self-pay | Admitting: Neurology

## 2010-08-05 DIAGNOSIS — I773 Arterial fibromuscular dysplasia: Secondary | ICD-10-CM

## 2010-08-10 ENCOUNTER — Other Ambulatory Visit: Payer: Self-pay | Admitting: Pulmonary Disease

## 2010-08-12 ENCOUNTER — Ambulatory Visit
Admission: RE | Admit: 2010-08-12 | Discharge: 2010-08-12 | Disposition: A | Discharge status: Home / Self Care | Payer: BC Managed Care – PPO | Source: Ambulatory Visit | Attending: Neurology | Admitting: Neurology

## 2010-08-12 DIAGNOSIS — I773 Arterial fibromuscular dysplasia: Secondary | ICD-10-CM

## 2010-08-12 MED ORDER — GADOBENATE DIMEGLUMINE 529 MG/ML IV SOLN
15.0000 mL | Freq: Once | INTRAVENOUS | Status: AC | PRN
  Administered 2010-08-12: 15 mL via INTRAVENOUS

## 2010-08-16 ENCOUNTER — Encounter: Payer: Self-pay | Admitting: Gastroenterology

## 2010-08-16 ENCOUNTER — Ambulatory Visit (INDEPENDENT_AMBULATORY_CARE_PROVIDER_SITE_OTHER): Payer: BC Managed Care – PPO | Admitting: Gastroenterology

## 2010-08-16 VITALS — BP 110/68 | HR 76 | Ht 62.0 in | Wt 163.4 lb

## 2010-08-16 DIAGNOSIS — K219 Gastro-esophageal reflux disease without esophagitis: Secondary | ICD-10-CM

## 2010-08-16 DIAGNOSIS — K3184 Gastroparesis: Secondary | ICD-10-CM

## 2010-08-16 NOTE — Progress Notes (Signed)
History of Present Illness: This is a 65 year old African American female with chronic GERD on PPI therapy. Recent gastric emptying scan showed 58% retention at 2 hours. She has started domperidone 10 mg 3 times a day with mild improvement.    Current Medications, Allergies, Past Medical History, Past Surgical History, Family History and Social History were reviewed in [employer] record.   Assessment and plan: Significant gastroparesis. I discontinued Levsin and Librax and have placed her on a step 3 gastroparesis diet with continued doses of domperidone.. I will see her back in several weeks' time for followup or when necessary as needed. She is to continue her Nexium and pancreatic extracts. No diagnosis found.

## 2010-08-16 NOTE — Patient Instructions (Addendum)
Stop Librax and Levsin. Continue Domperidone. Follow the Step 3 gastroparesis diet, we gave you today.

## 2010-08-27 NOTE — Assessment & Plan Note (Signed)
[facility]                         GASTROENTEROLOGY OFFICE NOTE   WENDIE, DISKIN                        MRN:          [id number]  DATE:04/06/2007                            DOB:          30-Jul-1945    Angela [name] is doing much better but has been on antibiotics again since she  was last seen.  She subsequently developed oral candidiasis and was  treated appropriately with Mycostatin per Dr. Shelle Iron.  She currently is  asymptomatic except for belching and burping and is continuing on a  reflux regimen with daily Nexium and p.r.n. Carafate.  I have added  Florastor to her regimen for 2 weeks.  At her request, an oropharyngeal  exam was unremarkable as was examination of the neck without thyromegaly  or lymphadenopathy.  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 GI followup as needed.     [name]. Jarold Motto, MD, Caleen Essex, FAGA  Electronically Signed    DRP/MedQ  DD: 04/06/2007  DT: 04/06/2007  Job #: [id number]

## 2010-08-30 NOTE — Assessment & Plan Note (Signed)
[facility]                         GASTROENTEROLOGY OFFICE NOTE   [name], [name]                        MRN:          [id number]  DATE:06/23/2006                            DOB:          01/06/1946    Angela [name] continues with gas, bloating, grumbling, reflux symptoms, etc.  She is no better on any therapy.  She empirically tried Ultrase MT20  without improvement.  She takes Nexium 40 mg q.a.m. and Metoclopramide  10 mg at bedtime.  She also complains of rectal soreness and has known  hemorrhoids for which I prescribed some local Xyralid LP and RC cream  and lotion.   Angela [name] has a long history of functional GI complaints and I doubt we  will ever make her GI symptoms better.  I have empirically placed her on  Xifaxan 400 mg t.i.d. along with Florastor probiotic therapy for the  next 10 days to see if she has any symptomatic improvement.  I see no  need to repeat her endoscopic exams at this time.  If she continues to  be symptomatic, I will refer her to the [facility] at [facility].     [name]. Jarold Motto, MD, Caleen Essex, FAGA  Electronically Signed    DRP/MedQ  DD: 06/23/2006  DT: 06/25/2006  Job #: [id number]   cc:   Lonzo Cloud. Kriste Basque, MD

## 2010-08-30 NOTE — Assessment & Plan Note (Signed)
[facility]                         GASTROENTEROLOGY OFFICE NOTE   ALYSAH, CARTON                        MRN:          [id number]  DATE:07/14/2006                            DOB:          09-07-1945    PROBLEM LIST:  1. Recent viral gastroenteritis, irritable bowel syndrome.  2. Knot in rectum.   HISTORY:  Jaliya comes in today for followup.  She was last seen by Dr.  Jarold Motto July 01, 2005.  At that time had had a recent viral illness  with nausea, vomiting, and diarrhea.  Had complained of a lot of ongoing  problems with gas, bloating, and grumbling which was felt to be  functional.  She was given a trial of Xifaxan for 10 days and Florastor  to see if this would help her symptomatically.   The patient returns today stating that the Xifaxan and Florastor did  help quite a bit, and that she is feeling much better.  She continues  with alternating bowel habits, but is not having nearly as much  discomfort, bloating, etc.  She is concerned today about a knot that  she felt in her rectum.  She has not been having any rectal pain or  irritation, has not noted any melena or hematochezia.  She is concerned  because she has a history of polyps.   Last colonoscopy was done in January 2006 and did show scattered  diverticula throughout the colon and external hemorrhoids.   MEDICATIONS:  1. Ultrase MT 20 one daily.  2. Celebrex 200 daily.  3. Hydrochlorothiazide 25 daily.  4. Nexium 40 daily.  5. Synthroid 75 daily.  6. Kay-Ciel 20 daily.  7. Premarin 0.625 daily.  8. Reglan 10 daily.  9. FiberCon 2 daily.   ALLERGIES:  CODEINE AND TETRACYCLINE.   FAMILY HISTORY:  Pertinent for an aunt with colon cancer.   EXAMINATION:  Well-developed African American female in no acute  distress.  Weight is 151.8, blood pressure 106/74, pulse is in the 80s.  CARDIOVASCULAR:  Regular rate and rhythm with S1 and S2.  PULMONARY: Clear to A&P.  ABDOMEN:   Soft and nontender, there is no palpable mass or  hepatosplenomegaly.  RECTAL:  Negative, there is no knot or lesion felt in the rectum.  It is  a nontender exam and she is Hemoccult negative.   IMPRESSION:  1. Reassurance no further workup necessary for knot.  2. Continue Align 1 p.o. daily as she did find the treatment for      bacterial overgrowth to be helpful.  3. Refill Reglan at the patient's request for 10 mg nightly.  4. Continue current regimen and return office visit p.r.n.      Mike Gip, PA-C  Electronically Signed      [name]. Jarold Motto, MD, Caleen Essex, FAGA  Electronically Signed   AE/MedQ  DD: 07/17/2006  DT: 07/17/2006  Job #: [id number]

## 2010-08-30 NOTE — Assessment & Plan Note (Signed)
[facility]                         GASTROENTEROLOGY OFFICE NOTE   CAIA, LOFARO                        MRN:          [id number]  DATE:05/05/2006                            DOB:          May 12, 1945    Angela [name] is doing well and is losing weight on a low fat diet per Dr.  Kriste Basque. She complains of a lot of abdominal gas, bloating, and abdominal  noises. She is having some mild constipation but her main problem is gas  and bloating. She has known lactose intolerance but use lactose  supplement milk. She is taking regular Nexium for acid reflux along with  metoclopramide 10 mg at bedtime and p.r.n. Carafate. She has known acid  reflux problems and had an endoscopy a year ago. 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 She  additionally has been bothered with recurrent hypokalemia and is on  potassium supplementation at this time. She is followed closely Dr.  Kriste Basque for her general medical problems. She is status post  cholecystectomy. She is on a variety of different medications listed in  the chart including Celebrex  200 mg a day and Fibercon. She also uses  p.r.n. Librax.   She is a healthy-appearing black female appearing younger than her  stated age. She weighs 148 pounds, blood pressure 122/78, pulse was 88  and regular. I could not appreciate hepatosplenomegaly, abdominal masses  or tenderness. Bowel sounds were normal. I could not appreciate  succussion splash.   ASSESSMENT:  1. Well-controlled acid reflux disease.  2. Diverticulosis and irritable bowel syndrome.  3. 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 On close      questioning today however she denies the use of sorbitol or      fructose or other dietary food products.  4. History of recurrent colon polyps.  5. Status post cholecystectomy.  6. History of  chronic fibromyalgia.   RECOMMENDATIONS:  1. Continue all medications listed above.  2. Trial of Align and probiotic therapy along with Activia yogurt with      Lactobacillus.  3. Slow gas diet instructions.  4. Consider trial of Xifaxan therapy depending on the clinical course.  5. Continue other medications as per Dr. Kriste Basque.     [name]. Jarold Motto, MD, Caleen Essex, FAGA  Electronically Signed    DRP/MedQ  DD: 05/05/2006  DT: 05/05/2006  Job #: [id number]   cc:   Lonzo Cloud. Kriste Basque, MD

## 2010-08-30 NOTE — Op Note (Signed)
NAMESHONDELL, Angela [name]               ACCOUNT NO.:  [id number]   MEDICAL RECORD NO.:  [id number]          PATIENT TYPE:  AMB   LOCATION:  [facility]                          FACILITY:  [facility]   PHYSICIAN:  Lubertha Basque. Dalldorf, M.D.DATE OF BIRTH:  07-Dec-1945   DATE OF PROCEDURE:  DATE OF DISCHARGE:                               OPERATIVE REPORT   PREOPERATIVE DIAGNOSIS:  Right foot hallux valgus.   POSTOPERATIVE DIAGNOSIS:  Right foot hallux valgus.   PROCEDURE:  Right foot Chevron osteotomy.   ANESTHESIA:  General.   ATTENDING SURGEON:  Lubertha Basque. Jerl Santos, M.D.   ASSISTANT:  Lindwood Qua, P.A.   INDICATIONS FOR PROCEDURE:  The patient is a 65 year old woman with a  long history of bilateral painful bunions.  These have persisted to  bother her despite wide shoes and oral anti-inflammatories.  She has  pain which limits her ability to walk and wear shoes and she is offered  a Chevron osteotomy on the right side which is most symptomatic at this  point.  Informed operative consent was obtained after discussion of  possible complications of reaction to anesthesia, infection,  neurovascular injury.   SUMMARY OF FINDINGS AND PROCEDURE:  Under general anesthesia a right  foot hallux valgus was addressed.  We first performed a bunionectomy and  then a Chevron osteotomy stabilized with one Orthosorb pin.  I used  fluoroscopy throughout the case to make appropriate intraoperative  decisions.  I read all these views myself.  Bryna Colander assisted  throughout and was invaluable to the completion of the case and he  helped position and retract while I performed the procedure.  He also  closed simultaneously to help minimize OR time.   DESCRIPTION OF PROCEDURE:  The patient was taken to the operating suite  where general anesthetic was applied without difficulty.  She was  positioned supine and prepped, draped in normal sterile fashion.  After  the administration of preop IV Kefzol.  The right  foot was elevated,  exsanguinated, tourniquet inflated about the calf.  A dorsomedial  incision was made with dissection down to the first MTP capsule.  A Y-  shaped incision was made in this structure based distally.  The bunion  was exposed.  An oscillating saw was used to make a cut parallel with  the medial border of the foot.  We then made a Chevron shaped cut based  proximally in the metatarsal head and shifted the distal portion in the  lateral direction about 6 or 8 mm.  This was then stabilized with a  single Orthosorb pin.  I then used the oscillating saw again to resect  the resulting prominence at the base of the osteotomy site.  Fluoroscopy  was again used to confirm adequate displacement at the osteotomy site  and adequate resection of the bunion.  The wound was irrigated.  Some  excess capsular tissues were excised followed by repair of the capsule  with 2-0 undyed Vicryl in interrupted fashion.  Tourniquet was deflated  and a small amount of bleeding was easily controlled with some pressure.  The skin  was then closed with vertical mattresses of nylon.  Some  Marcaine was injected about the incision site followed by Adaptic and  dry gauze dressing with a loose Ace wrap.  Estimated blood loss and  intraoperative fluids as well as accurate tourniquet time can be  obtained from anesthesia records.   DISPOSITION:  The patient was extubated in the operating room and taken  to recovery room in stable addition.  She was to go home same-day and  follow up in the office in less than a week.  I will contact her by  phone tonight.      Lubertha Basque Jerl Santos, M.D.  Electronically Signed     PGD/MEDQ  D:  03/17/2006  T:  03/18/2006  Job:  [id number]

## 2010-09-03 ENCOUNTER — Telehealth: Payer: Self-pay | Admitting: Cardiology

## 2010-09-03 NOTE — Telephone Encounter (Signed)
Metoprolol most likely not causing weight pain and is first line of therapy for Pvcs. Continue if able; otherwise can dc and palpitations may worsen. Olga Millers

## 2010-09-03 NOTE — Telephone Encounter (Signed)
RN s/w Pt re: concerns of Metoprolol contributing to her gradual weight gain; is there another medicine that would be better for her? She lost 30 pounds, was placed on Lyrica gain +15 pounds, since beginning Metoprolol in Sept. She feels that she has gradually gained weight +10 pounds. She has gained a total of 25 pounds and very concerned re: her "panic weight". Pt felt she was gaining weight while attending the [facility] and maintaining her low salt and low fat diet. Pt does admit that she has not been in her routine in the last month since her father had a stroke.  Pt has been treated for H. Pylori, has chronic GERD on Nexium and pancreatic extracts. On 08/16/10, Dr Jarold Motto placed Pt on step 3 gastroparesis diet and began domperidone. RN discussed with Pt her diet restrictions and will forward message to Dr Jens Som for his review of metoprolol. Pt verbalizes understanding and will wait to hear back from our office.

## 2010-09-03 NOTE — Telephone Encounter (Signed)
discuss medication- metoprolol  C/O weight gain.

## 2010-09-04 NOTE — Telephone Encounter (Signed)
Patient aware.

## 2010-09-05 ENCOUNTER — Encounter: Payer: Self-pay | Admitting: Pulmonary Disease

## 2010-09-17 ENCOUNTER — Ambulatory Visit (INDEPENDENT_AMBULATORY_CARE_PROVIDER_SITE_OTHER): Payer: BC Managed Care – PPO | Admitting: Pulmonary Disease

## 2010-09-17 DIAGNOSIS — R0989 Other specified symptoms and signs involving the circulatory and respiratory systems: Secondary | ICD-10-CM

## 2010-09-17 DIAGNOSIS — I10 Essential (primary) hypertension: Secondary | ICD-10-CM

## 2010-09-17 DIAGNOSIS — IMO0001 Reserved for inherently not codable concepts without codable children: Secondary | ICD-10-CM

## 2010-09-17 DIAGNOSIS — K3184 Gastroparesis: Secondary | ICD-10-CM

## 2010-09-17 DIAGNOSIS — K219 Gastro-esophageal reflux disease without esophagitis: Secondary | ICD-10-CM

## 2010-09-17 DIAGNOSIS — E039 Hypothyroidism, unspecified: Secondary | ICD-10-CM

## 2010-09-17 DIAGNOSIS — E559 Vitamin D deficiency, unspecified: Secondary | ICD-10-CM

## 2010-09-17 DIAGNOSIS — R0789 Other chest pain: Secondary | ICD-10-CM

## 2010-09-17 DIAGNOSIS — E785 Hyperlipidemia, unspecified: Secondary | ICD-10-CM

## 2010-09-17 DIAGNOSIS — F411 Generalized anxiety disorder: Secondary | ICD-10-CM

## 2010-09-17 DIAGNOSIS — I7789 Other specified disorders of arteries and arterioles: Secondary | ICD-10-CM

## 2010-09-17 DIAGNOSIS — K5730 Diverticulosis of large intestine without perforation or abscess without bleeding: Secondary | ICD-10-CM

## 2010-09-17 DIAGNOSIS — M1990 Unspecified osteoarthritis, unspecified site: Secondary | ICD-10-CM

## 2010-09-17 DIAGNOSIS — R002 Palpitations: Secondary | ICD-10-CM

## 2010-09-17 NOTE — Progress Notes (Signed)
Subjective:    Patient ID: Angela [name], female    DOB: 1946/02/06, 65 y.o.   MRN: [id number]  HPI 65 y/o BF here for a follow up visit... she has mult medical problems as listed below...   ~  December 19, 2009:  last week she was seen in ER & adm for 1d due to CP, pressure, & palpit... she ruled out w/ neg EKG & Enz, monitor showed rare PVC & she was started on Metoprolol 12.5mg Bid... she had recent hx w/ tachypalpit at night waking her from sleep & we called in Klonopin 0.5 Bid which has helped... she is anxious & has been using Pseudophed in Mucinex-D.Marland KitchenMarland Kitchen we reviewed hosp records> H&P, DCSummary, XRays, & Labs (repeat TFTs were WNL)- refer to Cards for eval & prob Myoview, ?Holter, & reassurance... 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  ~  March 21, 2010:  She had outpt Cards eval by Aletta Edouard w/ 2DEcho showing mild MR, norm LVF;  Stress Echo 9/11 showed normal LVF & no stress induced wall motion abnormalities;  CardioNet showed NSR w/ PVC's;  RA Dopplers were normal- no plaque & no fibromuschyperplasia... she has some palpit (PVCs) w/ dyspnea & the Clonopin 0.5mg  Bid helps... DrCrenshaw started Metoprolol 25mg Bid as well & she is stable...    She also had GI eval by DrPatterson w/ EGD 9/11 showing HH otherw normal (neg biopsies);  24H pH Probe 9/11 showed reflux in the recumbant position;  Esoph manometry 9/11 was normal;  Rx w/ Nexium daily, Carafate Prn, Levsin Prn... she is much improved now...  ~  September 17, 2010:  47mo ROV & she has kept in close contact w/ all her specialty physicians>> she is under incr stress due to father's illness (adm to [place] w/ stroke & ?cancer); BP controlled, denies cerebral ischemic symptoms; she stopped her Simva20 due to wt gain & muscle aches, she will ret for FLP> see prob list below>>    She had f/u DrCrenshaw 2/12 w/ PVC's incr at night & rec for extra Metoprolol 25mg  Prn...    On-going eval from  [facility] showed an abn gastric emptying scan c/w gastroparesis & he rec Domperidone 10mg  Tid...    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    Finally had f/u DrDaldorf as well w/ shot in shoulder for impingement & chr instability;  She sees DrDeveshwar periodically as well for her FM on mult meds including Celebrex, Vicodin, Lidoderm...   Problem List:  HYPERTENSION (ICD-401.9) - controlled on METOPROLOL 25mg  Bid & HCT 25mg /d & K20/d... BP=116/74  today and doing well- denies HA, visual changes, CP, palipit, dizziness, syncope, dyspnea, edema, etc...  CHEST PAIN, ATYPICAL (ICD-786.59) - on ASA 81mg /d... hx CWP x 25 yrs assoc w/ her FM... symptoms flair on & off, but doing OK overall & no angina. ~  9/11: developed chest "pressure" assoc w/ palpit & went to ER w/ 1d adm by TH> EKG, Enz, CXR, Labs- essent neg (K=3.2 & supplemented, had rare PVC on monitor)... referred to [facility]. ~  9/11:  seen by DrCrenshaw w/ 2DEcho showing mild MR, norm LVF;  Stress Echo 9/11 showed normal LVF & no stress induced wall motion abnormalities... ~  2/12: she had cardiac eval by DrCrenshaw w/ norm EKG, doing well overall just some palpit at night & he rec additional 25mg   Metoprolol prn.  PALPITATIONS (ICD-785.1)  <SEE ABOVE> she's noted tachypalpit & assoc w/ vivid dreams waking her from sleep... she was given METOPROLOL 25mg - 1/2 Bid in hosp 9/11 (monitor w/ PVC only) & this was incr to 25mg  Bid by DrCrenshaw... we called in KLONOPIN 0.5mg  Bid which helped the palpit as well... she was also drinking mod caffeine & taking Pseudophed> asked to stop both... ~  CardioNet 9/11 showed NSR w/ PVC's, treated w/ BBlocker.  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 she saw DrLewitt for eval HAs & he did MRI at [facility] confirming no signif plaque & prob Fibromusc Hyperplasia... of note> she has mild HBP easily controlled on HCT & subseq RADopplers 9/11 showed normal (norm RAs, norm Ao, norm renal size etc)... she remains on ASA 81mg /d. ~  she continues to f/u w/ DrLewitt who does repeat MRI/ MRA periodically to follow. ~  RA Dopplers 9/11 were normal- no plaque & no fibromuschyperplasia...  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  HYPERCHOLESTEROLEMIA (ICD-272.0) - prev on Simva20 but she stopped on her own due to wt gain & muscle aches... ~  FLP 6/09 showed TChol 220, TG 88, HDL 67, LDL 138... rec- start Simva20, + diet/ exercise. ~  FLP 03/13/08 showed TChol 188, TG 95, HDL, 80, LDL 89... looks great- same Rx. ~  3/10 stopped Simva20 for 1 mo due to muscle symptoms- no change off med therefore restarted. ~  FLP 9/10 on Simva20 showed TChol 160. TG 101, HDL 64, LDL 76 ~  FLP 4/11 on Simva20 showed TChol 181, TG 117, HDL 73, LDL 85 ~  FLP in hosp 9/11 showed TChol 187, TG 204, HDL 63, LDL 83 ~  She subsequently stopped Simva on her own due to wt gain & muscle aches, better off this med. 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 currently taking LEVOTHYROID Daily... ~  labs 9/10 showed TSH= 0.19 ~  labs 4/11 showed TSH= 0.28 ~  labs 8/11 by DrPatterson showed TSH= 0.10 ~  labs 9/11 in hosp showed TSH= 0.91, FreeT3= 2.3 (2.3-4.2), FreeT4= 1.07 (0.80-1.80) ~  ;abs 6/12 ==> pending  HIATAL HERNIA (ICD-553.3), GERD & GASTROPARESIS - Rx NEXIUM 40mg /d, DOMPERIDONE 10mg  Tid, CARAFATE Prn (which she takes for "real bad" indigestion), PANCREASE Tid, and now probiotics (ALIGN).Marland Kitchen. ~  EGD by DrPatterson was 1/07 and  showed 4cm HH, esophagitis, & duodenitis...  ~  repeat EGD 09/13/07 w/ 3cmHH, gastritis, stricture- dilated... ~  treated 6/10 for gas/ bloating/ discomfort w/ Xifaxan but no better... ~  repeat EGD 9/11 by DrPatterson showed HH, otherw normal & bx was neg... ~  Gastric Emptying scan 4/12 showed 87% retention at 1H & 58% retention at 2H (norm <30% at [facility]).  DIVERTICULAR DISEASE (ICD-562.10) IRRITABLE BOWEL SYNDROME, HX OF (ICD-V12.79) COLONIC POLYPS (ICD-211.3) -  ~  colonoscopy was 1/06 showing divertics and hems... f/u planned 5 yrs. ~  12/09 had eval for abd pain & other symptoms- CT w/ ?sm abscess- ?diverticulits... f/u colonoscopy 1/10 by DrPatterson w/ severe pan-diverticulosis but no inflamm (resolved on Cipro/ Flagyl Rx)... ~  2/11:  another eval from DrPatterson for gas/ bloating/ etc> colonoscopy showed divertics & tiny hyperpl polyp removed... ~  8/11: on going eval from DrPatterson (LUQ pain & nausea)- CT Abd showed large & sm bowel divertics w/o inflamm, s/p GB & Hyst, & mild fatty liver dis...  OSTEOARTHRITIS (ICD-715.90) - sees DrDaldorf... hx bilat shoulder problems (impingement & chr instability) w/ shots & PT- see his notes. CERVICAL & LUMBAR SPONDYLOSIS FIBROMYALGIA (ICD-729.1) - DrDeveshwar tried Lyrica 50mg Tid but off this now per DrLewitt due to blurry vision & 15# weight gain... she takes SKELAXIN 800mg TidPrn, CELEBREX (ave 1 tab Qod), and Tylenol...  ~  she saw DrRowe yrs ago and had Rheum re-evaluation by Rober Minion 6/09- they discussed Lyrica/ Topamax/ Flexeril therapy (also rec Align for her IBS, Urology referral for her IC, supplements for her DJD, & lab work up for her fatigue)... ~  now she says DrLewitt is treating her FM> she goes to [facility] for massage & water exercises.  VITAMIN D DEFICIENCY (ICD-268.9) - on Vit D 50000 u weekly... ~  labs 6/09 showed Vit D level = 22... start 50K weekly... ~  labs 11/09 showed Vit D level = 51... asked to switch to  OTC Vit D supplement but she continued the perscription.  MIGRAINE HEADACHE (ICD-346.90) - uses [facility], plus the above meds and is followed by DrLewitt... also saw him for dizziness and Dx w/ benign positional vertigo Rx w/ exercises...  ANXIETY (ICD-300.00) - on KLONOPIN 0.5mg  Bid...  ACNE ROSACEA (ICD-695.3)  Health Maintenance - GYN= DrARoss (on Premarin) & he does PAP smears, Mammograms, & BMD's on her...   Past Surgical History  Procedure Date  . Cholecystectomy 1985  . Vaginal hysterectomy 1993  . Cesarean section   . Foot osteotomy 2006    Right foot  . Tubal ligation 1983    Outpatient Encounter Prescriptions as of 09/17/2010  Medication Sig Dispense Refill  . AMBULATORY NON FORMULARY MEDICATION Domperidone 10mg  take one tablet before meals and at bedtime.  240 tablet  3  . aspirin 81 MG tablet Take 81 mg by mouth daily.        Marland Kitchen CARAFATE 1 GM/10ML suspension TAKE 10 ML BY MOUTH AS NEEDED  1200 mL  0  . Carboxymethylcellul-Glycerin (OPTIVE) 0.5-0.9 % SOLN Apply to eye as needed.        . celecoxib (CELEBREX) 200 MG capsule Take 200 mg by mouth daily as needed. As needed for  Arthritis pain       . clonazePAM (KLONOPIN) 0.5 MG tablet Take 0.5 mg by mouth 2 (two) times daily as needed.        . ergocalciferol (VITAMIN D2) 50000 UNITS capsule Take 50,000 Units by mouth once a week.        . estrogens, conjugated, (PREMARIN) 0.625 MG tablet Take 0.625 mg by mouth daily. Take daily for 21 days then do not take for 7 days.       . hydrochlorothiazide 25 MG tablet Take 25 mg by mouth daily.        Marland Kitchen HYDROcodone-acetaminophen (VICODIN) 5-500 MG per tablet Take 1 tablet by mouth as needed. 1 tab q4-6hrs as needed for pain       . levothyroxine (SYNTHROID, LEVOTHROID) 75 MCG tablet Take 75 mcg by mouth daily.        Marland Kitchen lidocaine (LIDODERM) 5 % Place 1 patch onto the skin as needed. Remove & Discard patch within 12 hours or as directed by MD       .  metoprolol tartrate (LOPRESSOR) 25 MG  tablet Take 25 mg by mouth 2 (two) times daily.        . mometasone (NASONEX) 50 MCG/ACT nasal spray 2 sprays by Nasal route daily. 2 sprays in each nostril daily       . NEXIUM 40 MG capsule TAKE 1 CAPSULE BY MOUTH ONCE A DAY  30 capsule  5  . Pancrelipase, Lip-Prot-Amyl, (PANCREAZE) 34742 UNITS CPEP Take by mouth 3 (three) times daily.        . polycarbophil (FIBERCON) 625 MG tablet Take 625 mg by mouth daily.        . potassium chloride SA (K-DUR,KLOR-CON) 20 MEQ tablet TAKE 1 TABLET DAILY  90 tablet  2  . Probiotic Product (ALIGN) 4 MG CAPS Take 1 capsule by mouth daily.        . Sulfacetamide Sodium (OVACE PLUS) 10 % CREA Apply topically as needed.        . chlorzoxazone (PARAFON) 500 MG tablet Take 500 mg by mouth as needed.          Allergies  Allergen Reactions  . Codeine     REACTION: GI upset/vomit  . Tetracycline     REACTION: GI bleed    Review of Systems        See HPI - all other systems neg except as noted... The patient complains of decreased hearing, chest pain, dyspnea on exertion, headaches, abdominal pain, severe indigestion/heartburn, muscle weakness, and difficulty walking.  The patient denies anorexia, fever, weight loss, weight gain, vision loss, hoarseness, syncope, peripheral edema, prolonged cough, hemoptysis, melena, hematochezia, hematuria, incontinence, suspicious skin lesions, transient blindness, depression, unusual weight change, abnormal bleeding, enlarged lymph nodes, and angioedema.   Objective:   Physical Exam   WD, WN, 65 y/o BF in NAD...  Vital Signs:  Reviewed... GENERAL:  Alert & oriented; pleasant & cooperative... HEENT:  [place]/AT, EOM-full, PERRLA, EACs-clear, TMs-wnl, NOSE- rosacea rash,  THROAT-clear & wnl. NECK:  Supple w/ fair ROM; no JVD; sl decr carotid impulses w/ bilat L>R bruits ; no thyromegaly or nodules palpated; no lymphadenopathy. CHEST:  Clear to P & A; without wheezes/ rales/ or rhonchi heard... +trigger points. HEART:  Regular  Rhythm; without murmurs/ rubs/ or gallops detected... ABDOMEN:  Soft & nontender; normal bowel sounds; no organomegaly or masses detected. EXT: without deformities or arthritic changes; no varicose veins/ venous insuffic/ or edema. NEURO:  no focal neuro deficits... DERM:  without lesions seen x acne.   Assessment & Plan:   HBP>  Controlled on meds, continue same...  ChestPain & Palpit>  Per DrCrenshaw & she can take extra Metoprolol 25mg  Prn for palpit...  Carotid Bruits & FIBROMUSC DYSPLASIA>  Followed by DrLewitt w/ MRA 4/12 as above...  CHOL>  She stopped her Statin due to wt gain & musc aches, she will ret for FLP, may need Lipid Clinic help...  HYPOTHY>  Stable on Levothy60mcg/d...  GI> HH/ GERD/ Gastroparesis/ Divertics, IBS/ Polyps> managed by DrPatterson & now on Domperidone 10mg  Tid for the gastroparesis...  RHEUM> DJD/ FM/ etc> followed by DrDeveshwar & Daldorf as noted above...  ANXIETY>  On Klonopin 0.5mg  Bid.Marland KitchenMarland Kitchen

## 2010-09-17 NOTE — Patient Instructions (Signed)
Today we updated your med list in EPIC...    Continue your current medication regimen the same...  Please return at your convenience one morning to our lab for your fasting blood work...    Then call the [facility] in a few days for your results...    Dial [id number] & when prompted enter your patient number followed by the # symbol...    Your patient number is:  [id number]#  Stay as active as poss...  Call for any questions...  Let's plan another routine appt in 6 months, sooner if needed for any problems that might arise.Marland KitchenMarland Kitchen

## 2010-09-19 ENCOUNTER — Other Ambulatory Visit: Payer: Self-pay | Admitting: Pulmonary Disease

## 2010-09-19 ENCOUNTER — Other Ambulatory Visit (INDEPENDENT_AMBULATORY_CARE_PROVIDER_SITE_OTHER): Payer: BC Managed Care – PPO

## 2010-09-19 DIAGNOSIS — I10 Essential (primary) hypertension: Secondary | ICD-10-CM

## 2010-09-19 DIAGNOSIS — E559 Vitamin D deficiency, unspecified: Secondary | ICD-10-CM

## 2010-09-19 DIAGNOSIS — E785 Hyperlipidemia, unspecified: Secondary | ICD-10-CM

## 2010-09-19 DIAGNOSIS — F411 Generalized anxiety disorder: Secondary | ICD-10-CM

## 2010-09-19 DIAGNOSIS — K219 Gastro-esophageal reflux disease without esophagitis: Secondary | ICD-10-CM

## 2010-09-19 LAB — HEPATIC FUNCTION PANEL
ALT: 20 U/L (ref 0–35)
AST: 20 U/L (ref 0–37)
Alkaline Phosphatase: 54 U/L (ref 39–117)
Bilirubin, Direct: 0.1 mg/dL (ref 0.0–0.3)
Total Bilirubin: 0.4 mg/dL (ref 0.3–1.2)

## 2010-09-19 LAB — BASIC METABOLIC PANEL
Chloride: 100 mEq/L (ref 96–112)
GFR: 87.75 mL/min (ref >60.00)
Potassium: 3.7 mEq/L (ref 3.5–5.1)
Sodium: 140 mEq/L (ref 135–145)

## 2010-09-19 LAB — LIPID PANEL
HDL: 65.2 mg/dL (ref >39.00)
Total CHOL/HDL Ratio: 3
VLDL: 25.8 mg/dL (ref 0.0–40.0)

## 2010-09-19 LAB — CBC WITH DIFFERENTIAL/PLATELET
Eosinophils Absolute: 0.1 10*3/uL (ref 0.0–0.7)
Eosinophils Relative: 2.2 % (ref 0.0–5.0)
HCT: 35.3 % — ABNORMAL LOW (ref 36.0–46.0)
Lymphs Abs: 2.5 10*3/uL (ref 0.7–4.0)
MCHC: 34.5 g/dL (ref 30.0–36.0)
MCV: 91.5 fl (ref 78.0–100.0)
Monocytes Absolute: 0.5 10*3/uL (ref 0.1–1.0)
Platelets: 337 10*3/uL (ref 150.0–400.0)
WBC: 5.6 10*3/uL (ref 4.5–10.5)

## 2010-09-19 LAB — TSH: TSH: 0.38 u[IU]/mL (ref 0.35–5.50)

## 2010-09-21 ENCOUNTER — Other Ambulatory Visit: Payer: Self-pay | Admitting: Pulmonary Disease

## 2010-09-22 ENCOUNTER — Encounter: Payer: Self-pay | Admitting: Pulmonary Disease

## 2010-10-01 ENCOUNTER — Telehealth: Payer: Self-pay | Admitting: Pulmonary Disease

## 2010-10-01 MED ORDER — POTASSIUM CHLORIDE CRYS ER 20 MEQ PO TBCR: 20.0000 meq | EXTENDED_RELEASE_TABLET | Freq: Every day | ORAL | Status: DC

## 2010-10-01 NOTE — Telephone Encounter (Signed)
Refill sent to wrong pharmacy. Correction made.  Pt aware.Carron Curie, CMA

## 2010-10-07 ENCOUNTER — Other Ambulatory Visit: Payer: Self-pay | Admitting: Pulmonary Disease

## 2010-11-08 ENCOUNTER — Encounter: Payer: Self-pay | Admitting: *Deleted

## 2010-11-08 ENCOUNTER — Encounter: Payer: Self-pay | Admitting: Cardiology

## 2010-11-14 ENCOUNTER — Encounter: Payer: Self-pay | Admitting: Gastroenterology

## 2010-11-14 ENCOUNTER — Ambulatory Visit (INDEPENDENT_AMBULATORY_CARE_PROVIDER_SITE_OTHER): Payer: BC Managed Care – PPO | Admitting: Gastroenterology

## 2010-11-14 VITALS — BP 120/68 | HR 80 | Ht 62.0 in | Wt 162.0 lb

## 2010-11-14 DIAGNOSIS — K9089 Other intestinal malabsorption: Secondary | ICD-10-CM

## 2010-11-14 DIAGNOSIS — F419 Anxiety disorder, unspecified: Secondary | ICD-10-CM

## 2010-11-14 DIAGNOSIS — F341 Dysthymic disorder: Secondary | ICD-10-CM

## 2010-11-14 DIAGNOSIS — K219 Gastro-esophageal reflux disease without esophagitis: Secondary | ICD-10-CM

## 2010-11-14 DIAGNOSIS — F32A Depression, unspecified: Secondary | ICD-10-CM

## 2010-11-14 DIAGNOSIS — K3184 Gastroparesis: Secondary | ICD-10-CM | POA: Insufficient documentation

## 2010-11-14 DIAGNOSIS — K589 Irritable bowel syndrome without diarrhea: Secondary | ICD-10-CM

## 2010-11-14 MED ORDER — COLESTIPOL HCL 1 G PO TABS: 1.0000 g | ORAL_TABLET | Freq: Every day | ORAL | Status: DC

## 2010-11-14 NOTE — Patient Instructions (Signed)
Stop Fibercon, Librarian, academic and Pancrease. Start Colestid one tablet mid morning you may increase to two tablets a day if needed.

## 2010-11-14 NOTE — Progress Notes (Signed)
This is a 65 year old female patient of Dr.Scott Nadel's and mine who we have followed for many years. She has chronic IBS with gas and bloating, chronic GERD, and gastroparesis. She has had multiple GI workups which otherwise have been unremarkable. Treatment for bacterial overgrowth syndrome, chronic pancreatitis, and IBS of all been of little benefit. Currently she is on domperidone 10 mg 3 times a day before meals, and I have discontinued all anti-cholinergics. She continues to complain of gas and bloating especially in her left upper quadrant when laying on her left side. She continues with multiple soft nonbloody bowel movements a day, but has had no anorexia, weight loss, or systemic complaints. I spent a great deal of time today reviewing her medications with her, and I made suggestions about  discontinuing some of these drugs. She had open cholecystectomy some 20 years ago. Current Medications, Allergies, Past Medical History, Past Surgical History, Family History and Social History were reviewed in [employer] record.  Pertinent Review of Systems Negative Past Medical History  Diagnosis Date  . Hypertension   . Hyperlipidemia   . Hypothyroidism   . Hiatal hernia   . GERD (gastroesophageal reflux disease)   . Diverticulosis   . IBS (irritable bowel syndrome)   . Colon polyps   . Osteoarthritis   . Fibromyalgia   . Vitamin D deficiency   . Migraine   . Anxiety   . Acne rosacea    Past Surgical History  Procedure Date  . Cholecystectomy 1985  . Vaginal hysterectomy 1993  . Cesarean section   . Foot osteotomy 2006    Right foot  . Tubal ligation 1983    reports that she has never smoked. She has never used smokeless tobacco. She reports that she does not drink alcohol or use illicit drugs. family history includes Colon cancer in her maternal aunt; Colon polyps in her father and sister; Diabetes in her sister; Hypertension in her father and sister; Mitral  valve prolapse in her mother; Stomach cancer in her maternal aunt; and Stroke in her father. Allergies  Allergen Reactions  . Codeine     REACTION: GI upset/vomit  . Tetracycline     REACTION: GI bleed       Physical Exam: Awake and alert in no acute distress appearing her stated age. I cannot appreciate stigmata of chronic liver disease. Abdominal exam shows no distention, organomegaly, masses or tenderness. Bowel sounds are active but nonobstructive in nature. Mental status is normal.    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 She also appears to have gastroparesis with secondary acid reflux doing well on domperidone and Nexium. I have discontinued Carafate, Pancrease, robotics, all anticholinergics, and we will see how she does on Colestid 1-2 g a day 2 hours apart from other medications. She is to continue other medications as listed and reviewed per primary care. She is to call a some 2 weeks for progress report. The patient is up-to-date on her endoscopy and colonoscopy exams.

## 2010-12-12 ENCOUNTER — Ambulatory Visit (INDEPENDENT_AMBULATORY_CARE_PROVIDER_SITE_OTHER): Payer: BC Managed Care – PPO | Admitting: Cardiology

## 2010-12-12 ENCOUNTER — Encounter: Payer: Self-pay | Admitting: Cardiology

## 2010-12-12 VITALS — BP 121/73 | HR 67 | Resp 12 | Ht 62.0 in | Wt 163.0 lb

## 2010-12-12 DIAGNOSIS — I10 Essential (primary) hypertension: Secondary | ICD-10-CM

## 2010-12-12 DIAGNOSIS — E785 Hyperlipidemia, unspecified: Secondary | ICD-10-CM

## 2010-12-12 DIAGNOSIS — R002 Palpitations: Secondary | ICD-10-CM

## 2010-12-12 NOTE — Assessment & Plan Note (Signed)
Blood pressure controlled. Continue present medications. 

## 2010-12-12 NOTE — Progress Notes (Signed)
[id number] female I saw in Sept 2011 for evaluation of chest pain. Admitted in Sept 2011 with chest pain and echocardiogram showed normal LV function. There was mild mitral regurgitation. Cardiac markers, d-dimer, liver functions were normal. The BNP less than 30. TSH normal. Abdominal ultrasound in September of 2011 was normal. Also with h/o palpitations. A stress echocardiogram was performed in September of 2011 and revealed normal LV function and no stress-induced wall motion abnormalities. A CardioNet was performed and revealed sinus rhythm with PVCs. Palpitations treated with beta blocker. Since I last saw her in Feb 2012, there is mild dyspnea on exertionbut no orthopnea, PND, pedal edema, chest pain or syncope. She continues to have occasional palpitations but improved compared to previous. They are increased in the past few weeks. She does state that she has been stressed from the death of her father.   Current Outpatient Prescriptions  Medication Sig Dispense Refill  . acidophilus (RISAQUAD) CAPS Take 1 capsule by mouth daily.        . AMBULATORY NON FORMULARY MEDICATION Domperidone 10mg  take one tablet before meals and at bedtime.  240 tablet  3  . aspirin 81 MG tablet Take 81 mg by mouth daily.        Marland Kitchen CARAFATE 1 GM/10ML suspension TAKE 10 ML BY MOUTH AS NEEDED  1200 mL  0  . Carboxymethylcellul-Glycerin (OPTIVE) 0.5-0.9 % SOLN Apply to eye as needed.        . celecoxib (CELEBREX) 200 MG capsule Take 200 mg by mouth daily as needed. As needed for  Arthritis pain       . chlorzoxazone (PARAFON) 500 MG tablet Take 500 mg by mouth as needed.        . clidinium-chlordiazePOXIDE (LIBRAX) 2.5-5 MG per capsule Take 1 capsule by mouth as needed.        . clonazePAM (KLONOPIN) 0.5 MG tablet Take 0.5 mg by mouth 2 (two) times daily as needed.        . colestipol (COLESTID) 1 G tablet Take 1-2 tablets (1-2 g total) by mouth daily.  60 tablet  2  . estrogens, conjugated, (PREMARIN) 0.625 MG tablet  Take 0.625 mg by mouth daily. Take daily for 21 days then do not take for 7 days.       . hydrochlorothiazide 25 MG tablet TAKE 1 TABLET DAILY  90 tablet  2  . HYDROcodone-acetaminophen (VICODIN) 5-500 MG per tablet Take 1 tablet by mouth as needed. 1 tab q4-6hrs as needed for pain       . levothyroxine (SYNTHROID, LEVOTHROID) 75 MCG tablet Take 75 mcg by mouth daily.        Marland Kitchen lidocaine (LIDODERM) 5 % Place 1 patch onto the skin as needed. Remove & Discard patch within 12 hours or as directed by MD       . metoprolol tartrate (LOPRESSOR) 25 MG tablet Take 25 mg by mouth 2 (two) times daily.        Marland Kitchen NEXIUM 40 MG capsule TAKE 1 CAPSULE BY MOUTH ONCE A DAY  30 capsule  5  . polycarbophil (FIBERCON) 625 MG tablet Take 625 mg by mouth daily. 2 tablets daily       . potassium chloride SA (K-DUR,KLOR-CON) 20 MEQ tablet Take 1 tablet (20 mEq total) by mouth daily.  90 tablet  2  . Sulfacetamide Sodium (OVACE PLUS) 10 % CREA Apply topically as needed.        . Triamcinolone Acetonide (NASACORT AQ NA) Place into  the nose.        . Vitamin D, Ergocalciferol, (DRISDOL) 50000 UNITS CAPS TAKE ONE TABLET BY MOUTH ONCE A WEEK  4 capsule  6  . hyoscyamine (LEVSIN) 0.125 MG tablet Take 0.125 mg by mouth every 4 (four) hours as needed.        . mometasone (NASONEX) 50 MCG/ACT nasal spray 2 sprays by Nasal route daily. 2 sprays in each nostril daily          Past Medical History  Diagnosis Date  . Hypertension   . Hyperlipidemia   . Hypothyroidism   . Hiatal hernia   . GERD (gastroesophageal reflux disease)   . Diverticulosis   . IBS (irritable bowel syndrome)   . Colon polyps   . Osteoarthritis   . Fibromyalgia   . Vitamin D deficiency   . Migraine   . Anxiety   . Acne rosacea     Past Surgical History  Procedure Date  . Cholecystectomy 1985  . Vaginal hysterectomy 1993  . Cesarean section   . Foot osteotomy 2006    Right foot  . Tubal ligation 1983    History   Social History  .  Marital Status: Married    Spouse Name: N/A    Number of Children: 2  . Years of Education: N/A   Occupational History  . Retired   .     Social History Main Topics  . Smoking status: Never Smoker   . Smokeless tobacco: Never Used  . Alcohol Use: No  . Drug Use: No  . Sexually Active: Not on file   Other Topics Concern  . Not on file   Social History Narrative  . No narrative on file    ROS: no fevers or chills, productive cough, hemoptysis, dysphasia, odynophagia, melena, hematochezia, dysuria, hematuria, rash, seizure activity, orthopnea, PND, pedal edema, claudication. Remaining systems are negative.  Physical Exam: Well-developed well-nourished in no acute distress.  Skin is warm and dry.  HEENT is normal.  Neck is supple. No thyromegaly.  Chest is clear to auscultation with normal expansion.  Cardiovascular exam is regular rate and rhythm.  Abdominal exam nontender or distended. No masses palpated. Extremities show no edema. neuro grossly intact  ECG NSR, LVH

## 2010-12-12 NOTE — Assessment & Plan Note (Signed)
Monitor previously showed PVCs. Continue metoprolol. Take additional 25 mg daily as needed.

## 2010-12-12 NOTE — Assessment & Plan Note (Signed)
Management per primary care. 

## 2010-12-12 NOTE — Patient Instructions (Signed)
Your physician recommends that you schedule a follow-up appointment in: 1 year with Dr. Crenshaw. 

## 2010-12-30 ENCOUNTER — Other Ambulatory Visit: Payer: Self-pay | Admitting: *Deleted

## 2010-12-30 MED ORDER — HYDROCODONE-ACETAMINOPHEN 5-500 MG PO TABS: 1.0000 | ORAL_TABLET | ORAL | Status: AC | PRN

## 2011-02-03 ENCOUNTER — Telehealth: Payer: Self-pay | Admitting: Gastroenterology

## 2011-02-03 NOTE — Telephone Encounter (Signed)
Pt 's  history includes IBS, Gastroparesis, GERD, Bile Salt induced diarrhea, Bacterial Overgrowth Syndrome, Chronic Pancreatitis and FIBROMYALGIA. Pt c/o 3 week hx of joint pain, feet going numb, heart palpitations, muscle soreness in her chest and legs. Pt took a Celebrex for relief- she usually takes this for her Fibromyalgia. She has an appt later this week to see her Ortho MD, but she is wondering if it's the Domperidone; pt started Domperidone tid 08/09/10. Please advise. Thanks.

## 2011-02-03 NOTE — Telephone Encounter (Signed)
Stop Domperidone.

## 2011-02-03 NOTE — Telephone Encounter (Signed)
Notified pt that Dr Juanda Chance instructed her to stop the Domperidone. Pt stated understanding and will update up on her condition and what the Ortho md instruced her to do.

## 2011-02-07 NOTE — Telephone Encounter (Signed)
Pt stated she saw her Orthopedic MD and he gave her a cortisone injection in her hand and instructed her to use the Celebrex daily x 1 week. She also stopped the Domperidone and will stop it x 1 week, then restart. Pt wants to discuss the Domperidone with Dr Jarold Motto and will come in after restarting it on 02/18/11 at 11:15am.

## 2011-02-18 ENCOUNTER — Ambulatory Visit (INDEPENDENT_AMBULATORY_CARE_PROVIDER_SITE_OTHER): Payer: BC Managed Care – PPO | Admitting: Gastroenterology

## 2011-02-18 ENCOUNTER — Ambulatory Visit (INDEPENDENT_AMBULATORY_CARE_PROVIDER_SITE_OTHER): Payer: BC Managed Care – PPO

## 2011-02-18 ENCOUNTER — Encounter: Payer: Self-pay | Admitting: Gastroenterology

## 2011-02-18 VITALS — BP 134/72 | HR 60 | Ht 62.0 in | Wt 163.0 lb

## 2011-02-18 DIAGNOSIS — R109 Unspecified abdominal pain: Secondary | ICD-10-CM | POA: Insufficient documentation

## 2011-02-18 DIAGNOSIS — Z23 Encounter for immunization: Secondary | ICD-10-CM

## 2011-02-18 DIAGNOSIS — K9089 Other intestinal malabsorption: Secondary | ICD-10-CM

## 2011-02-18 DIAGNOSIS — K219 Gastro-esophageal reflux disease without esophagitis: Secondary | ICD-10-CM

## 2011-02-18 DIAGNOSIS — K589 Irritable bowel syndrome without diarrhea: Secondary | ICD-10-CM

## 2011-02-18 MED ORDER — ESOMEPRAZOLE MAGNESIUM 40 MG PO CPDR: 40.0000 mg | DELAYED_RELEASE_CAPSULE | Freq: Every day | ORAL | Status: DC

## 2011-02-18 MED ORDER — LACTASE 3000 UNITS PO TABS: 1.0000 | ORAL_TABLET | Freq: Two times a day (BID) | ORAL | Status: DC

## 2011-02-18 NOTE — Progress Notes (Signed)
This is a very nice but very complicated 65 year old African American female on multiple medications with multiple medical problems. 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 She was placed on domperidone 10 mg before meals which she could not tolerate because of numbness and tingling in her hands and feet. She is status post cholecystectomy and is much improved on Colestid 2 g twice a day. She continues with vague abdominal pain of unexplained etiology. However, her appetite is good her weight is stable and she is doing as well as she has in several years.  Current Medications, Allergies, Past Medical History, Past Surgical History, Family History and Social History were reviewed in [employer] record.  Pertinent Review of Systems Negative   Physical Exam: She appears well and in good health. Abdominal exam is entirely unremarkable without organomegaly, masses, tenderness, or distention. Bowel sounds are normal. Mental status is normal.   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 I have asked her to continue her Colestid with when necessary sublingual Levsin use, Nexium 40 mg a day for GERD, to stay off of any prokinetic agents. She is to continue her followup with Dr.Nadel as planned.  Encounter Diagnoses  Name Primary?  . IBS (irritable bowel syndrome) Yes  . Bile salt-induced diarrhea   . GERD (gastroesophageal reflux disease)   . Abdominal pain, recurrent

## 2011-02-18 NOTE — Patient Instructions (Signed)
Continue taking your nexium as prescribed.   We have sent the following medications to your pharmacy for you to pick up at your convenience: Lactaid Take twice daily with meals.  CC: Alroy Dust M.D.

## 2011-02-27 ENCOUNTER — Other Ambulatory Visit: Payer: Self-pay | Admitting: *Deleted

## 2011-02-27 MED ORDER — CLONAZEPAM 0.5 MG PO TABS: 0.5000 mg | ORAL_TABLET | Freq: Two times a day (BID) | ORAL | Status: DC | PRN

## 2011-03-03 ENCOUNTER — Other Ambulatory Visit: Payer: Self-pay

## 2011-03-03 MED ORDER — METOPROLOL TARTRATE 25 MG PO TABS: 25.0000 mg | ORAL_TABLET | Freq: Two times a day (BID) | ORAL | Status: DC

## 2011-03-03 NOTE — Telephone Encounter (Signed)
..   Requested Prescriptions   Signed Prescriptions Disp Refills  . metoprolol tartrate (LOPRESSOR) 25 MG tablet 60 tablet 11    Sig: Take 1 tablet (25 mg total) by mouth 2 (two) times daily.    Authorizing Provider: Lewayne Bunting    Ordering User: Lacie Scotts   E-scribe to [facility] [address]

## 2011-03-19 ENCOUNTER — Ambulatory Visit (INDEPENDENT_AMBULATORY_CARE_PROVIDER_SITE_OTHER): Payer: BC Managed Care – PPO | Admitting: Pulmonary Disease

## 2011-03-19 ENCOUNTER — Other Ambulatory Visit (INDEPENDENT_AMBULATORY_CARE_PROVIDER_SITE_OTHER): Payer: BC Managed Care – PPO

## 2011-03-19 ENCOUNTER — Encounter: Payer: Self-pay | Admitting: Pulmonary Disease

## 2011-03-19 DIAGNOSIS — I10 Essential (primary) hypertension: Secondary | ICD-10-CM

## 2011-03-19 DIAGNOSIS — E785 Hyperlipidemia, unspecified: Secondary | ICD-10-CM

## 2011-03-19 DIAGNOSIS — K589 Irritable bowel syndrome without diarrhea: Secondary | ICD-10-CM

## 2011-03-19 DIAGNOSIS — K219 Gastro-esophageal reflux disease without esophagitis: Secondary | ICD-10-CM

## 2011-03-19 DIAGNOSIS — IMO0001 Reserved for inherently not codable concepts without codable children: Secondary | ICD-10-CM

## 2011-03-19 DIAGNOSIS — I7789 Other specified disorders of arteries and arterioles: Secondary | ICD-10-CM

## 2011-03-19 DIAGNOSIS — M1990 Unspecified osteoarthritis, unspecified site: Secondary | ICD-10-CM

## 2011-03-19 DIAGNOSIS — F411 Generalized anxiety disorder: Secondary | ICD-10-CM

## 2011-03-19 DIAGNOSIS — R002 Palpitations: Secondary | ICD-10-CM

## 2011-03-19 DIAGNOSIS — E039 Hypothyroidism, unspecified: Secondary | ICD-10-CM

## 2011-03-19 DIAGNOSIS — K3184 Gastroparesis: Secondary | ICD-10-CM

## 2011-03-19 LAB — BASIC METABOLIC PANEL
BUN: 14 mg/dL (ref 6–23)
Chloride: 102 mEq/L (ref 96–112)
Creatinine, Ser: 0.9 mg/dL (ref 0.4–1.2)
GFR: 81.95 mL/min (ref >60.00)
Glucose, Bld: 79 mg/dL (ref 70–99)
Potassium: 3.7 mEq/L (ref 3.5–5.1)

## 2011-03-19 NOTE — Progress Notes (Signed)
Subjective:    Patient ID: Angela [name], female    DOB: 10-26-1945, 65 y.o.   MRN: [id number]  HPI 65 y/o BF here for a follow up visit... she has mult medical problems as listed below...   ~  December 19, 2009:  last week she was seen in ER & adm for 1d due to CP, pressure, & palpit... she ruled out w/ neg EKG & Enz, monitor showed rare PVC & she was started on Metoprolol 12.5mg Bid... she had recent hx w/ tachypalpit at night waking her from sleep & we called in Klonopin 0.5 Bid which has helped... she is anxious & has been using Pseudophed in Mucinex-D.Marland KitchenMarland Kitchen we reviewed hosp records> H&P, DCSummary, XRays, & Labs (repeat TFTs were WNL)- refer to Cards for eval & prob Myoview, ?Holter, & reassurance... 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  ~  March 21, 2010:  She had outpt Cards eval by Aletta Edouard w/ 2DEcho showing mild MR, norm LVF;  Stress Echo 9/11 showed normal LVF & no stress induced wall motion abnormalities;  CardioNet showed NSR w/ PVC's;  RA Dopplers were normal- no plaque & no fibromuschyperplasia... she has some palpit (PVCs) w/ dyspnea & the Clonopin 0.5mg  Bid helps... DrCrenshaw started Metoprolol 25mg Bid as well & she is stable...    She also had GI eval by DrPatterson w/ EGD 9/11 showing HH otherw normal (neg biopsies);  24H pH Probe 9/11 showed reflux in the recumbant position;  Esoph manometry 9/11 was normal;  Rx w/ Nexium daily, Carafate Prn, Levsin Prn... she is much improved now...  ~  September 17, 2010:  50mo ROV & she has kept in close contact w/ all her specialty physicians>> she is under incr stress due to father's illness (adm to [place] w/ stroke & ?cancer); BP controlled, denies cerebral ischemic symptoms; she stopped her Simva20 due to wt gain & muscle aches, she will ret for FLP> see prob list below>>    She had f/u DrCrenshaw 2/12 w/ PVC's incr at night & rec for extra Metoprolol 25mg  Prn...    On-going eval from  [facility] showed an abn gastric emptying scan c/w gastroparesis & he rec Domperidone 10mg  Tid...    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    Finally had f/u DrDaldorf as well w/ shot in shoulder for impingement & chr instability;  She sees DrDeveshwar periodically as well for her FM on mult meds including Celebrex, Vicodin, Lidoderm...  ~  March 19, 2011:  50mo ROV & her CC= cramping in her hands for which she received an injection by Ortho;  She notes "same problems" but feels they are sl better overall;  She had Flu vaccine 11/12, meds same, BMet today is wnl...    She saw DrCrenshaw 8/12> Hx AtypCP & palpit w/ hx rare PVCs on Holter, otherw w/u was entirely neg; notes incr palpit w/ stress- rec continue Metoprolol & take extra 25mg  prn...    She had f/u DrPatterson 11/12> GI motility disorder w/ gas/ bloating, chr IBS & intol to the Domperidone 10mg  w/ paresthesias; s/p GB on Colestid 2gmBid; also takes Nexium 40mg /d & Lactaid Bid w/ meals; overall stable, continue same...    She saw DrDaldorg 10/12> lft 3rd MCP synovitis injected; bilat shoulder impingements w/ prev injection therapy; Cx & Lumbar spondylosis; foot surg in past; FM w/  trigger points on Lidoderm, Celebrex...          Problem List:  HYPERTENSION (ICD-401.9) - controlled on METOPROLOL 25mg  Bid & HCT 25mg /d & K20/d... BP=116/74  today and doing well- denies HA, visual changes, CP, palipit, dizziness, syncope, dyspnea, edema, etc...  CHEST PAIN, ATYPICAL (ICD-786.59) - on ASA 81mg /d... hx CWP x 25 yrs assoc w/ her FM... symptoms flair on & off, but doing OK overall & no angina. ~  9/11: developed chest "pressure" assoc w/ palpit & went to ER w/ 1d adm by TH> EKG, Enz, CXR, Labs- essent neg (K=3.2 & supplemented, had rare PVC on monitor)... referred to [facility]. ~  9/11:  seen by DrCrenshaw w/ 2DEcho showing mild MR, norm LVF;   Stress Echo 9/11 showed normal LVF & no stress induced wall motion abnormalities... ~  2/12: she had cardiac eval by DrCrenshaw w/ norm EKG, doing well overall just some palpit at night & he rec additional 25mg  Metoprolol prn.  PALPITATIONS (ICD-785.1)  <SEE ABOVE> she's noted tachypalpit & assoc w/ vivid dreams waking her from sleep... she was given METOPROLOL 25mg - 1/2 Bid in hosp 9/11 (monitor w/ PVC only) & this was incr to 25mg  Bid by DrCrenshaw... we called in KLONOPIN 0.5mg  Bid which helped the palpit as well... she was also drinking mod caffeine & taking Pseudophed> asked to stop both... ~  CardioNet 9/11 showed NSR w/ PVC's, treated w/ BBlocker.  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 she saw DrLewitt for eval HAs & he did MRI at [facility] confirming no signif plaque & prob Fibromusc Hyperplasia... of note> she has mild HBP easily controlled on HCT & subseq RADopplers 9/11 showed normal (norm RAs, norm Ao, norm renal size etc)... she remains on ASA 81mg /d. ~  she continues to f/u w/ DrLewitt who does repeat MRI/ MRA periodically to follow. ~  RA Dopplers 9/11 were normal- no plaque & no fibromuschyperplasia...  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  HYPERCHOLESTEROLEMIA (ICD-272.0) - prev on Simva20 but she stopped on her own due to wt gain & muscle aches... ~  FLP 6/09 showed TChol 220, TG 88, HDL 67, LDL 138... rec- start Simva20, + diet/ exercise. ~  FLP 03/13/08 showed TChol 188, TG 95, HDL, 80, LDL 89... looks great- same Rx. ~  3/10 stopped Simva20 for 1 mo due to muscle symptoms- no change off med therefore restarted. ~  FLP 9/10 on Simva20 showed TChol 160. TG 101, HDL 64, LDL 76 ~  FLP 4/11 on  Simva20 showed TChol 181, TG 117, HDL 73, LDL 85 ~  FLP in hosp 9/11 showed TChol 187, TG 204, HDL 63, LDL 83 ~  She subsequently stopped Simva on her own due to wt gain & muscle aches, better off this med. ~  FLP 6/12 off Simva on diet alone showed TChol 211, TG 129, HDL 65, LDL 126... rec better diet + exercise, she doesn't want meds.  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 currently taking LEVOTHYROID Daily... ~  labs 9/10 showed TSH= 0.19 ~  labs 4/11 showed TSH= 0.28 ~  labs 8/11 by DrPatterson showed TSH= 0.10 ~  labs 9/11  in hosp showed TSH= 0.91, FreeT3= 2.3 (2.3-4.2), FreeT4= 1.07 (0.80-1.80) ~  labs 6/12 on Levo75 showed TSH= 0.38  HIATAL HERNIA (ICD-553.3), GERD & GASTROPARESIS - Rx NEXIUM 40mg /d, DOMPERIDONE 10mg  Tid, CARAFATE Prn (which she takes for "real bad" indigestion), PANCREASE Tid, and now probiotics (ALIGN).Marland Kitchen. ~  EGD by DrPatterson was 1/07 and showed 4cm HH, esophagitis, & duodenitis...  ~  repeat EGD 09/13/07 w/ 3cmHH, gastritis, stricture- dilated... ~  treated 6/10 for gas/ bloating/ discomfort w/ Xifaxan but no better... ~  repeat EGD 9/11 by DrPatterson showed HH, otherw normal & bx was neg... ~  Gastric Emptying scan 4/12 showed 87% retention at 1H & 58% retention at 2H (norm <30% at [facility]).  DIVERTICULAR DISEASE (ICD-562.10) IRRITABLE BOWEL SYNDROME, HX OF (ICD-V12.79) COLONIC POLYPS (ICD-211.3) -  ~  colonoscopy was 1/06 showing divertics and hems... f/u planned 5 yrs. ~  12/09 had eval for abd pain & other symptoms- CT w/ ?sm abscess- ?diverticulits... f/u colonoscopy 1/10 by DrPatterson w/ severe pan-diverticulosis but no inflamm (resolved on Cipro/ Flagyl Rx)... ~  2/11: another eval from DrPatterson for gas/ bloating/ etc> colonoscopy showed divertics & tiny hyperpl polyp removed... ~  8/11: on going eval from DrPatterson (LUQ pain & nausea)- CT Abd showed large & sm bowel divertics  w/o inflamm, s/p GB & Hyst, & mild fatty liver dis...  OSTEOARTHRITIS (ICD-715.90) - sees DrDaldorf... hx bilat shoulder problems (impingement & chr instability) w/ shots & PT- see his notes. CERVICAL & LUMBAR SPONDYLOSIS FIBROMYALGIA (ICD-729.1) - DrDeveshwar tried Lyrica 50mg Tid but off this now per DrLewitt due to blurry vision & 15# weight gain... she takes SKELAXIN 800mg TidPrn, CELEBREX (ave 1 tab Qod), and Tylenol...  ~  she saw DrRowe yrs ago and had Rheum re-evaluation by Rober Minion 6/09- they discussed Lyrica/ Topamax/ Flexeril therapy (also rec Align for her IBS, Urology referral for her IC, supplements for her DJD, & lab work up for her fatigue)... ~  now she says DrLewitt is treating her FM> she goes to [facility] for massage & water exercises.  VITAMIN D DEFICIENCY (ICD-268.9) - on Vit D 50000 u weekly... ~  labs 6/09 showed Vit D level = 22... start 50K weekly... ~  labs 11/09 showed Vit D level = 51... asked to switch to OTC Vit D supplement but she continued the perscription. ~  Labs 6/12 showed Vit D level = 61... She prefers the 50K weekly Rx...  MIGRAINE HEADACHE (ICD-346.90) - uses [facility], plus the above meds and is followed by DrLewitt... also saw him for dizziness and Dx w/ benign positional vertigo Rx w/ exercises...  ANXIETY (ICD-300.00) - on KLONOPIN 0.5mg  Bid...  ACNE ROSACEA (ICD-695.3)  Health Maintenance - GYN= DrARoss (on Premarin) & he does PAP smears, Mammograms, & BMD's on her...   Past Surgical History  Procedure Date  . Cholecystectomy 1985  . Vaginal hysterectomy 1993  . Cesarean section   . Foot osteotomy 2006    Right foot  . Tubal ligation 1983    Outpatient Encounter Prescriptions as of 03/19/2011  Medication Sig Dispense Refill  . aspirin 81 MG tablet Take 81 mg by mouth daily.        Marland Kitchen CARAFATE 1 GM/10ML suspension TAKE 10 ML BY MOUTH AS NEEDED  1200 mL  0  . Carboxymethylcellul-Glycerin (OPTIVE) 0.5-0.9 % SOLN Apply to eye as  needed.        . celecoxib (CELEBREX) 200 MG capsule Take 200 mg by mouth daily as needed. As needed  for  Arthritis pain       . chlorzoxazone (PARAFON) 500 MG tablet Take 500 mg by mouth as needed.        . clonazePAM (KLONOPIN) 0.5 MG tablet Take 1 tablet (0.5 mg total) by mouth 2 (two) times daily as needed.  60 tablet  0  . colestipol (COLESTID) 1 G tablet Take 1-2 tablets (1-2 g total) by mouth daily.  60 tablet  2  . esomeprazole (NEXIUM) 40 MG capsule Take 1 capsule (40 mg total) by mouth daily before breakfast.  30 capsule  5  . estrogens, conjugated, (PREMARIN) 0.625 MG tablet Take 0.625 mg by mouth daily. Take daily for 21 days then do not take for 7 days.       . hydrochlorothiazide 25 MG tablet TAKE 1 TABLET DAILY  90 tablet  2  . HYDROcodone-acetaminophen (VICODIN) 5-500 MG per tablet Take 1 tablet by mouth as needed. 1 tab q4-6hrs as needed for pain  60 tablet  0  . lactase (LACTAID) 3000 UNITS tablet Take 1 tablet (3,000 Units total) by mouth 2 (two) times daily before a meal.  60 tablet  5  . levothyroxine (SYNTHROID, LEVOTHROID) 75 MCG tablet Take 75 mcg by mouth daily.        Marland Kitchen lidocaine (LIDODERM) 5 % Place 1 patch onto the skin as needed. Remove & Discard patch within 12 hours or as directed by MD       . metoprolol tartrate (LOPRESSOR) 25 MG tablet Take 1 tablet (25 mg total) by mouth 2 (two) times daily.  60 tablet  11  . mometasone (NASONEX) 50 MCG/ACT nasal spray 2 sprays by Nasal route daily. 2 sprays in each nostril daily       . potassium chloride SA (K-DUR,KLOR-CON) 20 MEQ tablet Take 1 tablet (20 mEq total) by mouth daily.  90 tablet  2  . Triamcinolone Acetonide (NASACORT AQ NA) Place into the nose.        . Vitamin D, Ergocalciferol, (DRISDOL) 50000 UNITS CAPS TAKE ONE TABLET BY MOUTH ONCE A WEEK  4 capsule  6  . DISCONTD: clidinium-chlordiazePOXIDE (LIBRAX) 2.5-5 MG per capsule Take 1 capsule by mouth as needed.          Allergies  Allergen Reactions  . Codeine       REACTION: GI upset/vomit  . Tetracycline     REACTION: GI bleed    Current Medications, Allergies, Past Medical History, Past Surgical History, Family History, and Social History were reviewed in [employer] record.    Review of Systems        See HPI - all other systems neg except as noted... The patient complains of decreased hearing, chest pain, dyspnea on exertion, headaches, abdominal pain, severe indigestion/heartburn, muscle weakness, and difficulty walking.  The patient denies anorexia, fever, weight loss, weight gain, vision loss, hoarseness, syncope, peripheral edema, prolonged cough, hemoptysis, melena, hematochezia, hematuria, incontinence, suspicious skin lesions, transient blindness, depression, unusual weight change, abnormal bleeding, enlarged lymph nodes, and angioedema.   Objective:   Physical Exam   WD, WN, 65 y/o BF in NAD...  Vital Signs:  Reviewed... GENERAL:  Alert & oriented; pleasant & cooperative... HEENT:  /AT, EOM-full, PERRLA, EACs-clear, TMs-wnl, NOSE- rosacea rash,  THROAT-clear & wnl. NECK:  Supple w/ fair ROM; no JVD; sl decr carotid impulses w/ bilat L>R bruits ; no thyromegaly or nodules palpated; no lymphadenopathy. CHEST:  Clear to P & A; without wheezes/ rales/ or rhonchi  heard... +trigger points. HEART:  Regular Rhythm; without murmurs/ rubs/ or gallops detected... ABDOMEN:  Soft & nontender; normal bowel sounds; no organomegaly or masses detected. EXT: without deformities or arthritic changes; no varicose veins/ venous insuffic/ or edema. NEURO:  no focal neuro deficits... DERM:  without lesions seen x acne.  RADIOLOGY DATA:  Reviewed in the EPIC EMR & discussed w/ the patient...  LABORATORY DATA:  Reviewed in the EPIC EMR & discussed w/ the patient...   Assessment & Plan:   HBP>  Controlled on meds, continue same...  ChestPain & Palpit>  Per DrCrenshaw & she can take extra Metoprolol 25mg  Prn for  palpit...  Carotid Bruits & FIBROMUSC DYSPLASIA>  Followed by DrLewitt w/ MRA 4/12 as above...  CHOL>  She stopped her Statin due to wt gain & musc aches, she will ret for FLP, may need Lipid Clinic help...  HYPOTHY>  Stable on Levothy69mcg/d...  GI> HH/ GERD/ Gastroparesis/ Divertics, IBS/ Polyps> managed by DrPatterson & now on Domperidone 10mg  Tid for the gastroparesis...  RHEUM> DJD/ FM/ etc> followed by DrDeveshwar & Daldorf as noted above...  ANXIETY>  On Klonopin 0.5mg  Bid...   Patient's Medications  New Prescriptions   No medications on file  Previous Medications   ASPIRIN 81 MG TABLET    Take 81 mg by mouth daily.     CARAFATE 1 GM/10ML SUSPENSION    TAKE 10 ML BY MOUTH AS NEEDED   CARBOXYMETHYLCELLUL-GLYCERIN (OPTIVE) 0.5-0.9 % SOLN    Apply to eye as needed.     CELECOXIB (CELEBREX) 200 MG CAPSULE    Take 200 mg by mouth daily as needed. As needed for  Arthritis pain    CHLORZOXAZONE (PARAFON) 500 MG TABLET    Take 500 mg by mouth as needed.     CLONAZEPAM (KLONOPIN) 0.5 MG TABLET    Take 1 tablet (0.5 mg total) by mouth 2 (two) times daily as needed.   COLESTIPOL (COLESTID) 1 G TABLET    Take 1-2 tablets (1-2 g total) by mouth daily.   ESOMEPRAZOLE (NEXIUM) 40 MG CAPSULE    Take 1 capsule (40 mg total) by mouth daily before breakfast.   ESTROGENS, CONJUGATED, (PREMARIN) 0.625 MG TABLET    Take 0.625 mg by mouth daily. Take daily for 21 days then do not take for 7 days.    HYDROCHLOROTHIAZIDE 25 MG TABLET    TAKE 1 TABLET DAILY   HYDROCODONE-ACETAMINOPHEN (VICODIN) 5-500 MG PER TABLET    Take 1 tablet by mouth as needed. 1 tab q4-6hrs as needed for pain   LACTASE (LACTAID) 3000 UNITS TABLET    Take 1 tablet (3,000 Units total) by mouth 2 (two) times daily before a meal.   LEVOTHYROXINE (SYNTHROID, LEVOTHROID) 75 MCG TABLET    Take 75 mcg by mouth daily.     LIDOCAINE (LIDODERM) 5 %    Place 1 patch onto the skin as needed. Remove & Discard patch within 12 hours or as  directed by MD    METOPROLOL TARTRATE (LOPRESSOR) 25 MG TABLET    Take 1 tablet (25 mg total) by mouth 2 (two) times daily.   MOMETASONE (NASONEX) 50 MCG/ACT NASAL SPRAY    2 sprays by Nasal route daily. 2 sprays in each nostril daily    POTASSIUM CHLORIDE SA (K-DUR,KLOR-CON) 20 MEQ TABLET    Take 1 tablet (20 mEq total) by mouth daily.   TRIAMCINOLONE ACETONIDE (NASACORT AQ NA)    Place into the nose.  VITAMIN D, ERGOCALCIFEROL, (DRISDOL) 50000 UNITS CAPS    TAKE ONE TABLET BY MOUTH ONCE A WEEK  Modified Medications   No medications on file  Discontinued Medications   CLIDINIUM-CHLORDIAZEPOXIDE (LIBRAX) 2.5-5 MG PER CAPSULE    Take 1 capsule by mouth as needed.

## 2011-03-19 NOTE — Patient Instructions (Signed)
Today we updated your med list in our EPIC system...    Continue your current medications the same...  Today we did your follow up blood work...    Please call the PHONE TREE in a few days for your results...    Dial [id number] & when prompted enter your patient number followed by the # symbol...    Your patient number is:  [phone]  Call for any questions...  Let's plan a follow up visit in 6 months w/ a CXR & FASTING blood work around that time.Marland KitchenMarland Kitchen

## 2011-03-20 ENCOUNTER — Other Ambulatory Visit: Payer: Self-pay | Admitting: Pulmonary Disease

## 2011-03-20 DIAGNOSIS — E785 Hyperlipidemia, unspecified: Secondary | ICD-10-CM

## 2011-03-20 DIAGNOSIS — F411 Generalized anxiety disorder: Secondary | ICD-10-CM

## 2011-03-20 DIAGNOSIS — I10 Essential (primary) hypertension: Secondary | ICD-10-CM

## 2011-03-20 DIAGNOSIS — E039 Hypothyroidism, unspecified: Secondary | ICD-10-CM

## 2011-03-20 DIAGNOSIS — E559 Vitamin D deficiency, unspecified: Secondary | ICD-10-CM

## 2011-03-20 DIAGNOSIS — R002 Palpitations: Secondary | ICD-10-CM

## 2011-03-31 ENCOUNTER — Telehealth: Payer: Self-pay | Admitting: Gastroenterology

## 2011-03-31 NOTE — Telephone Encounter (Signed)
Samples of nexium

## 2011-04-11 ENCOUNTER — Encounter: Payer: Self-pay | Admitting: Pulmonary Disease

## 2011-04-30 ENCOUNTER — Other Ambulatory Visit: Payer: Self-pay | Admitting: *Deleted

## 2011-04-30 MED ORDER — LEVOTHYROXINE SODIUM 75 MCG PO TABS: 75.0000 ug | ORAL_TABLET | Freq: Every day | ORAL | Status: DC

## 2011-05-01 ENCOUNTER — Telehealth: Payer: Self-pay | Admitting: Cardiology

## 2011-05-01 NOTE — Telephone Encounter (Signed)
Patient called stating she didn't take her metoprolol or aspirin yesterday and woke up this morning with palpitations. She was previously followed by Dr. Jens Som who found her palpitations to be PVCs. She was recently started on Prednisone and Oxycodone by ortho for a pinched nerve and was worried there might be interactions between those and her metoprolol. She wasn't sure how fast her heart was going but she felt it racing. I instructed her to take her Metoprolol and try to calm down, as she sounded very nervous. She was appreciative of being able to speak to someone to assure her it was ok to take her Metoprolol. She agreed to take her Metoprolol and call back if the palpitations continue or she develops chest pain.  Laniece Hornbaker 05/01/2011 7:06 AM

## 2011-05-20 ENCOUNTER — Encounter: Payer: Self-pay | Admitting: Adult Health

## 2011-05-20 ENCOUNTER — Ambulatory Visit (INDEPENDENT_AMBULATORY_CARE_PROVIDER_SITE_OTHER): Payer: BC Managed Care – PPO | Admitting: Adult Health

## 2011-05-20 DIAGNOSIS — J019 Acute sinusitis, unspecified: Secondary | ICD-10-CM

## 2011-05-20 MED ORDER — AZITHROMYCIN 250 MG PO TABS: ORAL_TABLET | ORAL | Status: AC

## 2011-05-20 NOTE — Patient Instructions (Signed)
Zpack take as directed with food.  Mucinex Twice daily  As needed  Congestion .  Saline nasal rinses As needed   Fluids and rest As needed   May use Tylenol sinus As needed  X 1-2 doses for sinus pressure.  Afrin 2 puffs. Twice daily  For 3 days and then stop.  Please contact office for sooner follow up if symptoms do not improve or worsen or seek emergency care  follow up Dr. Nadel  As planned and As needed    

## 2011-05-20 NOTE — Progress Notes (Signed)
Subjective:    Patient ID: Angela [name], female    DOB: 11-10-1945, 66 y.o.   MRN: [id number]  HPI 66 y/o BF   ~  December 19, 2009:  last week she was seen in ER & adm for 1d due to CP, pressure, & palpit... she ruled out w/ neg EKG & Enz, monitor showed rare PVC & she was started on Metoprolol 12.5mg Bid... she had recent hx w/ tachypalpit at night waking her from sleep & we called in Klonopin 0.5 Bid which has helped... she is anxious & has been using Pseudophed in Mucinex-D.Marland KitchenMarland Kitchen we reviewed hosp records> H&P, DCSummary, XRays, & Labs (repeat TFTs were WNL)- refer to Cards for eval & prob Myoview, ?Holter, & reassurance... 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  ~  March 21, 2010:  She had outpt Cards eval by Aletta Edouard w/ 2DEcho showing mild MR, norm LVF;  Stress Echo 9/11 showed normal LVF & no stress induced wall motion abnormalities;  CardioNet showed NSR w/ PVC's;  RA Dopplers were normal- no plaque & no fibromuschyperplasia... she has some palpit (PVCs) w/ dyspnea & the Clonopin 0.5mg  Bid helps... DrCrenshaw started Metoprolol 25mg Bid as well & she is stable...    She also had GI eval by DrPatterson w/ EGD 9/11 showing HH otherw normal (neg biopsies);  24H pH Probe 9/11 showed reflux in the recumbant position;  Esoph manometry 9/11 was normal;  Rx w/ Nexium daily, Carafate Prn, Levsin Prn... she is much improved now...  ~  September 17, 2010:  58mo ROV & she has kept in close contact w/ all her specialty physicians>> she is under incr stress due to father's illness (adm to [place] w/ stroke & ?cancer); BP controlled, denies cerebral ischemic symptoms; she stopped her Simva20 due to wt gain & muscle aches, she will ret for FLP> see prob list below>>    She had f/u DrCrenshaw 2/12 w/ PVC's incr at night & rec for extra Metoprolol 25mg  Prn...    On-going eval from [facility] showed an abn gastric emptying scan c/w gastroparesis & he rec  Domperidone 10mg  Tid...    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    Finally had f/u DrDaldorf as well w/ shot in shoulder for impingement & chr instability;  She sees DrDeveshwar periodically as well for her FM on mult meds including Celebrex, Vicodin, Lidoderm...  ~  March 19, 2011:  58mo ROV & her CC= cramping in her hands for which she received an injection by Ortho;  She notes "same problems" but feels they are sl better overall;  She had Flu vaccine 11/12, meds same, BMet today is wnl...    She saw DrCrenshaw 8/12> Hx AtypCP & palpit w/ hx rare PVCs on Holter, otherw w/u was entirely neg; notes incr palpit w/ stress- rec continue Metoprolol & take extra 25mg  prn...    She had f/u DrPatterson 11/12> GI motility disorder w/ gas/ bloating, chr IBS & intol to the Domperidone 10mg  w/ paresthesias; s/p GB on Colestid 2gmBid; also takes Nexium 40mg /d & Lactaid Bid w/ meals; overall stable, continue same...    She saw DrDaldorg 10/12> lft 3rd MCP synovitis injected; bilat shoulder impingements w/ prev injection therapy; Cx & Lumbar spondylosis; foot surg in past; FM w/ trigger points on Lidoderm, Celebrex...  05/20/2011 Acute OV  Complains of 1 week  of sinus congestion and drainage. Worse for last 3 days with sinus pain , pressure and headache. No otc used. No chest pain or fever. No cough .  Teeth pain and pressure in front of head/eyes. No visual/speech changes.           Problem List:  HYPERTENSION (ICD-401.9) - controlled on METOPROLOL 25mg  Bid & HCT 25mg /d & K20/d...  Marland Kitchen..  CHEST PAIN, ATYPICAL (ICD-786.59) - on ASA 81mg /d... hx CWP x 25 yrs assoc w/ her FM... symptoms flair on & off, but doing OK overall & no angina. ~  9/11: developed chest "pressure" assoc w/ palpit & went to ER w/ 1d adm by TH> EKG, Enz, CXR, Labs- essent neg (K=3.2 & supplemented, had rare PVC on monitor)... referred to [facility]. ~  9/11:  seen by DrCrenshaw w/ 2DEcho showing mild MR, norm LVF;  Stress Echo 9/11 showed normal LVF & no stress induced wall motion abnormalities... ~  2/12: she had cardiac eval by DrCrenshaw w/ norm EKG, doing well overall just some palpit at night & he rec additional 25mg  Metoprolol prn.  PALPITATIONS (ICD-785.1)  <SEE ABOVE> she's noted tachypalpit & assoc w/ vivid dreams waking her from sleep... she was given METOPROLOL 25mg - 1/2 Bid in hosp 9/11 (monitor w/ PVC only) & this was incr to 25mg  Bid by DrCrenshaw... we called in KLONOPIN 0.5mg  Bid which helped the palpit as well... she was also drinking mod caffeine & taking Pseudophed> asked to stop both... ~  CardioNet 9/11 showed NSR w/ PVC's, treated w/ BBlocker.  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 she saw DrLewitt for eval HAs & he did MRI at [facility] confirming no signif plaque & prob Fibromusc Hyperplasia... of note> she has mild HBP easily controlled on HCT & subseq RADopplers 9/11 showed normal (norm RAs, norm Ao, norm renal size etc)... she remains on ASA 81mg /d. ~  she continues to f/u w/ DrLewitt who does repeat MRI/ MRA periodically to follow. ~  RA Dopplers 9/11 were normal- no plaque & no fibromuschyperplasia...  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  HYPERCHOLESTEROLEMIA (ICD-272.0) - prev on Simva20 but she stopped on her own due to wt gain & muscle aches... ~  FLP 6/09 showed TChol 220, TG 88, HDL 67, LDL 138... rec- start Simva20, + diet/ exercise. ~  FLP 03/13/08 showed TChol 188, TG 95, HDL, 80, LDL 89... looks great- same Rx. ~  3/10 stopped Simva20 for 1 mo due to muscle symptoms- no change off med therefore restarted. ~  FLP  9/10 on Simva20 showed TChol 160. TG 101, HDL 64, LDL 76 ~  FLP 4/11 on Simva20 showed TChol 181, TG 117, HDL 73, LDL 85 ~  FLP in hosp 9/11 showed TChol 187, TG 204, HDL 63, LDL 83 ~  She subsequently stopped Simva on her own due to wt gain & muscle aches, better off this med. ~  FLP 6/12 off Simva on diet alone showed TChol 211, TG 129, HDL 65, LDL 126... rec better diet + exercise, she doesn't want meds.  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 currently taking LEVOTHYROID Daily... ~  labs 9/10 showed TSH=  0.19 ~  labs 4/11 showed TSH= 0.28 ~  labs 8/11 by DrPatterson showed TSH= 0.10 ~  labs 9/11 in hosp showed TSH= 0.91, FreeT3= 2.3 (2.3-4.2), FreeT4= 1.07 (0.80-1.80) ~  labs 6/12 on Levo75 showed TSH= 0.38  HIATAL HERNIA (ICD-553.3), GERD & GASTROPARESIS - Rx NEXIUM 40mg /d, DOMPERIDONE 10mg  Tid, CARAFATE Prn (which she takes for "real bad" indigestion), PANCREASE Tid, and now probiotics (ALIGN).Marland Kitchen. ~  EGD by DrPatterson was 1/07 and showed 4cm HH, esophagitis, & duodenitis...  ~  repeat EGD 09/13/07 w/ 3cmHH, gastritis, stricture- dilated... ~  treated 6/10 for gas/ bloating/ discomfort w/ Xifaxan but no better... ~  repeat EGD 9/11 by DrPatterson showed HH, otherw normal & bx was neg... ~  Gastric Emptying scan 4/12 showed 87% retention at 1H & 58% retention at 2H (norm <30% at [facility]).  DIVERTICULAR DISEASE (ICD-562.10) IRRITABLE BOWEL SYNDROME, HX OF (ICD-V12.79) COLONIC POLYPS (ICD-211.3) -  ~  colonoscopy was 1/06 showing divertics and hems... f/u planned 5 yrs. ~  12/09 had eval for abd pain & other symptoms- CT w/ ?sm abscess- ?diverticulits... f/u colonoscopy 1/10 by DrPatterson w/ severe pan-diverticulosis but no inflamm (resolved on Cipro/ Flagyl Rx)... ~  2/11: another eval from DrPatterson for gas/ bloating/ etc> colonoscopy showed divertics & tiny hyperpl polyp removed... ~  8/11: on going eval from  DrPatterson (LUQ pain & nausea)- CT Abd showed large & sm bowel divertics w/o inflamm, s/p GB & Hyst, & mild fatty liver dis...  OSTEOARTHRITIS (ICD-715.90) - sees DrDaldorf... hx bilat shoulder problems (impingement & chr instability) w/ shots & PT- see his notes. CERVICAL & LUMBAR SPONDYLOSIS FIBROMYALGIA (ICD-729.1) - DrDeveshwar tried Lyrica 50mg Tid but off this now per DrLewitt due to blurry vision & 15# weight gain... she takes SKELAXIN 800mg TidPrn, CELEBREX (ave 1 tab Qod), and Tylenol...  ~  she saw DrRowe yrs ago and had Rheum re-evaluation by Rober Minion 6/09- they discussed Lyrica/ Topamax/ Flexeril therapy (also rec Align for her IBS, Urology referral for her IC, supplements for her DJD, & lab work up for her fatigue)... ~  now she says DrLewitt is treating her FM> she goes to [facility] for massage & water exercises.  VITAMIN D DEFICIENCY (ICD-268.9) - on Vit D 50000 u weekly... ~  labs 6/09 showed Vit D level = 22... start 50K weekly... ~  labs 11/09 showed Vit D level = 51... asked to switch to OTC Vit D supplement but she continued the perscription. ~  Labs 6/12 showed Vit D level = 61... She prefers the 50K weekly Rx...  MIGRAINE HEADACHE (ICD-346.90) - uses [facility], plus the above meds and is followed by DrLewitt... also saw him for dizziness and Dx w/ benign positional vertigo Rx w/ exercises...  ANXIETY (ICD-300.00) - on KLONOPIN 0.5mg  Bid...  ACNE ROSACEA (ICD-695.3)  Health Maintenance - GYN= DrARoss (on Premarin) & he does PAP smears, Mammograms, & BMD's on her...   Past Surgical History  Procedure Date  . Cholecystectomy 1985  . Vaginal hysterectomy 1993  . Cesarean section   . Foot osteotomy 2006    Right foot  . Tubal ligation 1983    Outpatient Encounter Prescriptions as of 05/20/2011  Medication Sig Dispense Refill  . aspirin 81 MG tablet Take 81 mg by mouth daily.        Marland Kitchen CARAFATE 1 GM/10ML suspension TAKE 10 ML BY MOUTH AS NEEDED  1200 mL  0    . Carboxymethylcellul-Glycerin (OPTIVE) 0.5-0.9 % SOLN Apply to eye as needed.        Marland Kitchen  celecoxib (CELEBREX) 200 MG capsule Take 200 mg by mouth daily as needed. As needed for  Arthritis pain       . chlorzoxazone (PARAFON) 500 MG tablet Take 500 mg by mouth as needed.        . clonazePAM (KLONOPIN) 0.5 MG tablet Take 1 tablet (0.5 mg total) by mouth 2 (two) times daily as needed.  60 tablet  0  . colestipol (COLESTID) 1 G tablet Take 1-2 tablets (1-2 g total) by mouth daily.  60 tablet  2  . esomeprazole (NEXIUM) 40 MG capsule Take 1 capsule (40 mg total) by mouth daily before breakfast.  30 capsule  5  . estrogens, conjugated, (PREMARIN) 0.625 MG tablet Take 0.625 mg by mouth daily. Take daily for 21 days then do not take for 7 days.       . hydrochlorothiazide 25 MG tablet TAKE 1 TABLET DAILY  90 tablet  2  . isometheptene-acetaminophen-dichloralphenazone (MIDRIN) 65-325-100 MG capsule as needed.      Marland Kitchen levothyroxine (SYNTHROID, LEVOTHROID) 75 MCG tablet Take 1 tablet (75 mcg total) by mouth daily.  30 tablet  11  . lidocaine (LIDODERM) 5 % Place 1 patch onto the skin as needed. Remove & Discard patch within 12 hours or as directed by MD       . metoprolol tartrate (LOPRESSOR) 25 MG tablet Take 1 tablet (25 mg total) by mouth 2 (two) times daily.  60 tablet  11  . mometasone (NASONEX) 50 MCG/ACT nasal spray Place 2 sprays into the nose as needed. 2 sprays in each nostril daily      . potassium chloride SA (K-DUR,KLOR-CON) 20 MEQ tablet Take 1 tablet (20 mEq total) by mouth daily.  90 tablet  2  . traMADol (ULTRAM) 50 MG tablet Take 50 mg by mouth as needed. 1-2 tablets 3 times daily prn      . Vitamin D, Ergocalciferol, (DRISDOL) 50000 UNITS CAPS TAKE ONE TABLET BY MOUTH ONCE A WEEK  4 capsule  6  . HYDROcodone-acetaminophen (VICODIN) 5-500 MG per tablet Take 1 tablet by mouth as needed. 1 tab q4-6hrs as needed for pain  60 tablet  0  . lactase (LACTAID) 3000 UNITS tablet Take 1 tablet (3,000  Units total) by mouth 2 (two) times daily before a meal.  60 tablet  5  . Triamcinolone Acetonide (NASACORT AQ NA) Place into the nose.          Allergies  Allergen Reactions  . Codeine     REACTION: GI upset/vomit  . Tetracycline     REACTION: GI bleed    Current Medications, Allergies, Past Medical History, Past Surgical History, Family History, and Social History were reviewed in [employer] record.    Review of Systems        Constitutional:   No  weight loss, night sweats,  Fevers, chills, fatigue, or  lassitude.  HEENT:   No headaches,  Difficulty swallowing,  Tooth/dental problems, or  Sore throat,                No sneezing, itching, ear ache, nasal congestion, post nasal drip,   CV:  No chest pain,  Orthopnea, PND, swelling in lower extremities, anasarca, dizziness, palpitations, syncope.   GI  No heartburn, indigestion, abdominal pain, nausea, vomiting, diarrhea, change in bowel habits, loss of appetite, bloody stools.   Resp: No shortness of breath with exertion or at rest.  No excess mucus, no productive cough,  No  non-productive cough,  No coughing up of blood.  No change in color of mucus.  No wheezing.  No chest wall deformity  Skin: no rash or lesions.  GU: no dysuria, change in color of urine, no urgency or frequency.  No flank pain, no hematuria   MS:  No joint pain or swelling.  No decreased range of motion.  No back pain.  Psych:  No change in mood or affect. No depression or anxiety.  No memory loss.      Objective:   Physical Exam   WD, WN, 65 y/o BF in NAD...    GENERAL:  Alert & oriented; pleasant & cooperative... HEENT:  [place]/AT,  EACs-clear, TMs-wnl, NOSE- clear drainage, max tenderness THROAT-clear & wnl. NECK:  Supple w/ fair ROM; no JVD; sl decr carotid impulses w/ bilat L>R bruits ; no thyromegaly or nodules palpated; no lymphadenopathy. CHEST:  Clear to P & A; without wheezes/ rales/ or rhonchi heard.  HEART:   Regular Rhythm; without murmurs/ rubs/ or gallops detected... ABDOMEN:  Soft & nontender; normal bowel sounds; no organomegaly or masses detected. EXT: without deformities or arthritic changes; no varicose veins/ venous insuffic/ or edema. NEURO:  no focal neuro deficits...      Assessment & Plan:

## 2011-05-23 NOTE — Assessment & Plan Note (Signed)
Zpack take as directed with food.  Mucinex Twice daily  As needed  Congestion .  Saline nasal rinses As needed   Fluids and rest As needed   May use Tylenol sinus As needed  X 1-2 doses for sinus pressure.  Afrin 2 puffs. Twice daily  For 3 days and then stop.  Please contact office for sooner follow up if symptoms do not improve or worsen or seek emergency care  follow up Dr. Kriste Basque  As planned and As needed

## 2011-05-29 ENCOUNTER — Other Ambulatory Visit: Payer: Self-pay | Admitting: Pulmonary Disease

## 2011-06-06 ENCOUNTER — Telehealth: Payer: Self-pay | Admitting: Pulmonary Disease

## 2011-06-06 MED ORDER — LEVOTHYROXINE SODIUM 75 MCG PO TABS: 75.0000 ug | ORAL_TABLET | Freq: Every day | ORAL | Status: DC

## 2011-06-06 NOTE — Telephone Encounter (Signed)
Spoke with pt to verify msg. She states would like 30 day supply sent to retail pharm and then 90 day supply sent to [facility]. Rxs sent and she states nothing further needed.

## 2011-06-09 ENCOUNTER — Other Ambulatory Visit: Payer: Self-pay | Admitting: Pulmonary Disease

## 2011-06-10 ENCOUNTER — Encounter: Payer: Self-pay | Admitting: *Deleted

## 2011-06-12 ENCOUNTER — Encounter: Payer: Self-pay | Admitting: Gastroenterology

## 2011-06-12 ENCOUNTER — Ambulatory Visit (INDEPENDENT_AMBULATORY_CARE_PROVIDER_SITE_OTHER): Payer: BC Managed Care – PPO | Admitting: Gastroenterology

## 2011-06-12 VITALS — BP 112/66 | HR 76 | Ht 62.0 in | Wt 158.0 lb

## 2011-06-12 DIAGNOSIS — K589 Irritable bowel syndrome without diarrhea: Secondary | ICD-10-CM

## 2011-06-12 DIAGNOSIS — R109 Unspecified abdominal pain: Secondary | ICD-10-CM

## 2011-06-12 DIAGNOSIS — K9089 Other intestinal malabsorption: Secondary | ICD-10-CM

## 2011-06-12 MED ORDER — HYOSCYAMINE-PHENYLTOLOXAMINE 0.0625-15 MG PO CAPS: 1.0000 | ORAL_CAPSULE | Freq: Three times a day (TID) | ORAL | Status: DC | PRN

## 2011-06-12 NOTE — Progress Notes (Signed)
This is a 66 year old African American female who has chronic, chronic IBS with gas and bloating and bowel dysfunction. She has postcholecystectomy diarrhea managed by Colestid 2 g a day by mouth. She now complains of some gas, bloating, and abdominal cramping in her subcostal areas bilaterally. She has no specific upper GI or lower GI, hepatobiliary or systemic complaints otherwise. She is chronically on Nexium 40 mg a day for acid reflux. She is up-to-date or endoscopy and colonoscopy exams. She denies use of alcohol, cigarettes, or NSAIDs.  Current Medications, Allergies, Past Medical History, Past Surgical History, Family History and Social History were reviewed in [employer] record.  Pertinent Review of Systems Negative   Physical Exam: Blood pressure 112/66 and pulse 76 and regular. BMI 28.9. Healthy-appearing patient in no distress. There is no abdominal distention, organomegaly, masses or tenderness. Actually she does have some weight tenderness in the supra-umbilical area, but I cannot demonstrate a hernia with straight leg raising deep examination. Bowel sounds are normal.    Assessment and Plan: Patient with chronic IBS and multiple somatic complaints. I do not think her symptoms today are unusual for her, and I do not think further evaluation indicated at this time. I have given her Digest spasmodic capsules twice a day, FOD MAP IBS diet, and will continue other medications with telephone followup in 2-3 weeks' time.

## 2011-06-12 NOTE — Patient Instructions (Signed)
Try the samples of Digex one tablet by mouth three times a day as needed for abdominal cramping and pain. If the samples work well call back for a rx. Call back with a report on how you are doing in a week or two.  Follow the FODMAP diet given today.

## 2011-06-13 ENCOUNTER — Other Ambulatory Visit: Payer: Self-pay | Admitting: Pulmonary Disease

## 2011-06-16 ENCOUNTER — Telehealth: Payer: Self-pay | Admitting: Gastroenterology

## 2011-06-16 MED ORDER — HYOSCYAMINE-PHENYLTOLOXAMINE 0.0625-15 MG PO CAPS: 1.0000 | ORAL_CAPSULE | Freq: Three times a day (TID) | ORAL | Status: DC | PRN

## 2011-06-16 NOTE — Telephone Encounter (Signed)
rx called in no samples to give left message for pt

## 2011-06-23 ENCOUNTER — Telehealth: Payer: Self-pay | Admitting: Pulmonary Disease

## 2011-06-23 ENCOUNTER — Telehealth: Payer: Self-pay | Admitting: Gastroenterology

## 2011-06-23 NOTE — Telephone Encounter (Signed)
Requested prior auth form for celebrex.[facility] [address]  Spoke with pt an she is aware

## 2011-06-23 NOTE — Telephone Encounter (Signed)
Dr Kriste Basque ordered this medication so she will give them a call.

## 2011-06-24 NOTE — Telephone Encounter (Signed)
Pt returned triage's call.  Advised pt that pre-auth was approved & that we have faxed the approval to pharmacy letting them know.  Pt stated nothing further needed at this time.  Angela [name]

## 2011-06-24 NOTE — Telephone Encounter (Signed)
Received form from [employer] [address]. Called [phone] to initiate PA.  Med approved from 06/24/11 until 06/23/12. Case ID number [id number].  Form faxed back to pharmacy letting them know that this was approved. LMTCB so we can inform pt this was done.

## 2011-06-26 ENCOUNTER — Telehealth: Payer: Self-pay | Admitting: *Deleted

## 2011-06-26 NOTE — Telephone Encounter (Signed)
Called several times and left messages for pt to call back ans let me know what pharmacy she would like me to send her digex to, since [facility] not [facility].

## 2011-06-30 ENCOUNTER — Telehealth: Payer: Self-pay | Admitting: Gastroenterology

## 2011-06-30 ENCOUNTER — Telehealth: Payer: Self-pay | Admitting: Pulmonary Disease

## 2011-06-30 DIAGNOSIS — H9319 Tinnitus, unspecified ear: Secondary | ICD-10-CM

## 2011-06-30 MED ORDER — HYOSCYAMINE-PHENYLTOLOXAMINE 0.0625-15 MG PO CAPS: 1.0000 | ORAL_CAPSULE | Freq: Three times a day (TID) | ORAL | Status: DC | PRN

## 2011-06-30 NOTE — Telephone Encounter (Signed)
I spoke with spoke with pt and she states she has been having some ringing in her ears. The right is worse and sometimes it feels like it is closing up x Friday night. Pt is requesting to be referred to ENT. Please advise Dr. Kriste Basque, thanks

## 2011-06-30 NOTE — Telephone Encounter (Signed)
I have sent referral and pt is aware

## 2011-06-30 NOTE — Telephone Encounter (Signed)
Per SN---ok to refer to ENT for eval of ringing in her ears.  thanks

## 2011-06-30 NOTE — Telephone Encounter (Signed)
Spoke with patient and advised her that [facility] states they can get Digex from their wholesaler. She verbalizes understanding. A new script has been sent to the pharmacy.

## 2011-07-10 ENCOUNTER — Other Ambulatory Visit: Payer: Self-pay | Admitting: Family Medicine

## 2011-07-12 ENCOUNTER — Other Ambulatory Visit: Payer: Self-pay | Admitting: Pulmonary Disease

## 2011-08-12 ENCOUNTER — Other Ambulatory Visit: Payer: Self-pay | Admitting: Gastroenterology

## 2011-08-15 ENCOUNTER — Telehealth: Payer: Self-pay | Admitting: Gastroenterology

## 2011-08-15 NOTE — Telephone Encounter (Signed)
Pt's last OV 06/12/11; hx of IBS, Bile salt induced diarrhea, GERD, HH.  She reports for the past 3 days she's had increased gas, bloating, increased belching and it doesn't matter what she eats. She has soft stools, but that's probably d/t her IBS she states. She's on Nexium, Digex, Colestipol and tries to follow the FOD MAP diet as best she can. Please advise. Thanks.

## 2011-08-18 MED ORDER — RIFAXIMIN 550 MG PO TABS: ORAL_TABLET | ORAL | Status: DC

## 2011-08-18 NOTE — Telephone Encounter (Signed)
10 dqy of bid xifaxan

## 2011-08-18 NOTE — Telephone Encounter (Signed)
Informed pt Dr Jarold Motto would like to order Xifaxan x 10 days; pt requested [facility] [address]

## 2011-08-18 NOTE — Telephone Encounter (Signed)
10 days of xifaxax 550 mg bid.Marland Kitchen

## 2011-08-25 ENCOUNTER — Telehealth: Payer: Self-pay | Admitting: Cardiology

## 2011-08-25 NOTE — Telephone Encounter (Signed)
Patient request return call at [phone]   Patient c/o of palpitations awaken from deep sleep since last Thursday, please return call at [phone]

## 2011-08-25 NOTE — Telephone Encounter (Signed)
Change metoprolol to 25 every am and 50 every pm Angela [name]

## 2011-08-25 NOTE — Telephone Encounter (Signed)
Spoke with pt, Aware of dr Ludwig Clarks recommendations. She will call with cont problems.

## 2011-08-25 NOTE — Telephone Encounter (Signed)
Spoke with pt, she called to report for the last 3 nights in a row she has been awakened with palpitations. She reports waking to being sweaty, clammy and her heart racing. It will last from 5-8 minutes and then resolve. last night she was awakened about 5 times. This is the first night it has happened that much. She states she will be dreaming about being in a hurry and then will waken with these symptoms. She is currently taking metoprolol tart 25 mg bid, she has taken no extra. She is also having surgery for a bunion on her foot and is concerned with these symptoms and surgery. She has also stopped her clonazepam and wonders if that would help the palpitations. Will forward for dr Jens Som review

## 2011-08-26 ENCOUNTER — Other Ambulatory Visit: Payer: Self-pay | Admitting: Neurology

## 2011-08-26 DIAGNOSIS — R0989 Other specified symptoms and signs involving the circulatory and respiratory systems: Secondary | ICD-10-CM

## 2011-08-27 ENCOUNTER — Ambulatory Visit
Admission: RE | Admit: 2011-08-27 | Discharge: 2011-08-27 | Disposition: A | Discharge status: Home / Self Care | Payer: Medicare Other | Source: Ambulatory Visit | Attending: Neurology | Admitting: Neurology

## 2011-08-27 DIAGNOSIS — R0989 Other specified symptoms and signs involving the circulatory and respiratory systems: Secondary | ICD-10-CM

## 2011-08-31 ENCOUNTER — Other Ambulatory Visit: Payer: Self-pay | Admitting: Gastroenterology

## 2011-09-01 ENCOUNTER — Other Ambulatory Visit: Payer: Self-pay | Admitting: Neurology

## 2011-09-01 DIAGNOSIS — R0989 Other specified symptoms and signs involving the circulatory and respiratory systems: Secondary | ICD-10-CM

## 2011-09-02 ENCOUNTER — Ambulatory Visit
Admission: RE | Admit: 2011-09-02 | Discharge: 2011-09-02 | Disposition: A | Discharge status: Home / Self Care | Payer: Medicare Other | Source: Ambulatory Visit | Attending: Neurology | Admitting: Neurology

## 2011-09-02 DIAGNOSIS — R0989 Other specified symptoms and signs involving the circulatory and respiratory systems: Secondary | ICD-10-CM

## 2011-09-02 MED ORDER — GADOBENATE DIMEGLUMINE 529 MG/ML IV SOLN
20.0000 mL | Freq: Once | INTRAVENOUS | Status: AC | PRN
  Administered 2011-09-02: 20 mL via INTRAVENOUS

## 2011-09-04 HISTORY — PX: BUNIONECTOMY: SHX129

## 2011-09-09 ENCOUNTER — Telehealth: Payer: Self-pay | Admitting: Cardiology

## 2011-09-09 MED ORDER — METOPROLOL TARTRATE 25 MG PO TABS: ORAL_TABLET | ORAL | Status: DC

## 2011-09-09 NOTE — Telephone Encounter (Signed)
New Problem: ° ° ° °Patient called in needing a refill of her metoprolol tartrate (LOPRESSOR) 25 MG tablet. °

## 2011-09-19 ENCOUNTER — Telehealth: Payer: Self-pay | Admitting: Pulmonary Disease

## 2011-09-19 ENCOUNTER — Telehealth: Payer: Self-pay | Admitting: Gastroenterology

## 2011-09-19 NOTE — Telephone Encounter (Signed)
Pt reports she has been taking the Xifaxan for the 2nd day, and when she washed her face this am she felt a "rash". She describes this area as raised, not red and it doesn't itch and she has taken xifaxan iin the past. Pt was instructed to continue the med. If the "rash" gets larger, itches or reddens she needs to stop the med and call the doc on call. If she has swelling of the lips tongue or feels like her throat is swelling or she can't breathe, she is to call 911 immediately; pt stated understanding.

## 2011-09-19 NOTE — Telephone Encounter (Signed)
Called and spoke with pt and she is aware of labs and cxr in the computer for her and she will be by on Monday for these.  Nothing further needed.

## 2011-09-19 NOTE — Telephone Encounter (Signed)
Spoke with pt. She states that the rash is coming from a med that Dr Jarold Motto put her on and so will call him  She does state that she needs to come in Monday 6-10 to have cxr and fasting labs per SN request. Please advise once orders have been placed and I will let the pt know.

## 2011-09-22 ENCOUNTER — Other Ambulatory Visit (INDEPENDENT_AMBULATORY_CARE_PROVIDER_SITE_OTHER): Payer: Medicare Other

## 2011-09-22 ENCOUNTER — Ambulatory Visit (INDEPENDENT_AMBULATORY_CARE_PROVIDER_SITE_OTHER)
Admission: RE | Admit: 2011-09-22 | Discharge: 2011-09-22 | Disposition: A | Discharge status: Home / Self Care | Payer: Medicare Other | Source: Ambulatory Visit | Attending: Pulmonary Disease | Admitting: Pulmonary Disease

## 2011-09-22 DIAGNOSIS — F411 Generalized anxiety disorder: Secondary | ICD-10-CM

## 2011-09-22 DIAGNOSIS — E039 Hypothyroidism, unspecified: Secondary | ICD-10-CM

## 2011-09-22 DIAGNOSIS — E559 Vitamin D deficiency, unspecified: Secondary | ICD-10-CM

## 2011-09-22 DIAGNOSIS — E785 Hyperlipidemia, unspecified: Secondary | ICD-10-CM

## 2011-09-22 DIAGNOSIS — R002 Palpitations: Secondary | ICD-10-CM

## 2011-09-22 DIAGNOSIS — I10 Essential (primary) hypertension: Secondary | ICD-10-CM

## 2011-09-22 LAB — HEPATIC FUNCTION PANEL
ALT: 19 U/L (ref 0–35)
AST: 21 U/L (ref 0–37)
Alkaline Phosphatase: 55 U/L (ref 39–117)
Bilirubin, Direct: 0.1 mg/dL (ref 0.0–0.3)
Total Protein: 6.7 g/dL (ref 6.0–8.3)

## 2011-09-22 LAB — CBC WITH DIFFERENTIAL/PLATELET
Basophils Absolute: 0 10*3/uL (ref 0.0–0.1)
Eosinophils Absolute: 0.2 10*3/uL (ref 0.0–0.7)
Lymphocytes Relative: 50.6 % — ABNORMAL HIGH (ref 12.0–46.0)
MCHC: 32.7 g/dL (ref 30.0–36.0)
Neutrophils Relative %: 37.5 % — ABNORMAL LOW (ref 43.0–77.0)
Platelets: 399 10*3/uL (ref 150.0–400.0)
RBC: 3.95 Mil/uL (ref 3.87–5.11)
RDW: 13.3 % (ref 11.5–14.6)

## 2011-09-22 LAB — BASIC METABOLIC PANEL
CO2: 27 mEq/L (ref 19–32)
Chloride: 104 mEq/L (ref 96–112)
Creatinine, Ser: 1 mg/dL (ref 0.4–1.2)

## 2011-09-22 LAB — LIPID PANEL
Total CHOL/HDL Ratio: 3
Triglycerides: 121 mg/dL (ref 0.0–149.0)

## 2011-09-23 LAB — VITAMIN D 25 HYDROXY (VIT D DEFICIENCY, FRACTURES): Vit D, 25-Hydroxy: 52 ng/mL (ref 30–89)

## 2011-09-23 LAB — TSH: TSH: 0.3 u[IU]/mL — ABNORMAL LOW (ref 0.35–5.50)

## 2011-09-24 ENCOUNTER — Ambulatory Visit (INDEPENDENT_AMBULATORY_CARE_PROVIDER_SITE_OTHER): Payer: Medicare Other | Admitting: Pulmonary Disease

## 2011-09-24 ENCOUNTER — Encounter: Payer: Self-pay | Admitting: Pulmonary Disease

## 2011-09-24 VITALS — BP 112/68 | HR 92 | Temp 97.6°F | Ht 62.0 in | Wt 158.2 lb

## 2011-09-24 DIAGNOSIS — F411 Generalized anxiety disorder: Secondary | ICD-10-CM

## 2011-09-24 DIAGNOSIS — E039 Hypothyroidism, unspecified: Secondary | ICD-10-CM

## 2011-09-24 DIAGNOSIS — Z8719 Personal history of other diseases of the digestive system: Secondary | ICD-10-CM

## 2011-09-24 DIAGNOSIS — IMO0001 Reserved for inherently not codable concepts without codable children: Secondary | ICD-10-CM

## 2011-09-24 DIAGNOSIS — I10 Essential (primary) hypertension: Secondary | ICD-10-CM

## 2011-09-24 DIAGNOSIS — K589 Irritable bowel syndrome without diarrhea: Secondary | ICD-10-CM

## 2011-09-24 DIAGNOSIS — I7789 Other specified disorders of arteries and arterioles: Secondary | ICD-10-CM

## 2011-09-24 DIAGNOSIS — G43909 Migraine, unspecified, not intractable, without status migrainosus: Secondary | ICD-10-CM | POA: Insufficient documentation

## 2011-09-24 DIAGNOSIS — E785 Hyperlipidemia, unspecified: Secondary | ICD-10-CM

## 2011-09-24 DIAGNOSIS — K219 Gastro-esophageal reflux disease without esophagitis: Secondary | ICD-10-CM

## 2011-09-24 DIAGNOSIS — K5730 Diverticulosis of large intestine without perforation or abscess without bleeding: Secondary | ICD-10-CM

## 2011-09-24 DIAGNOSIS — M1990 Unspecified osteoarthritis, unspecified site: Secondary | ICD-10-CM

## 2011-09-24 MED ORDER — POTASSIUM CHLORIDE CRYS ER 20 MEQ PO TBCR: 20.0000 meq | EXTENDED_RELEASE_TABLET | Freq: Two times a day (BID) | ORAL | Status: DC

## 2011-09-24 NOTE — Progress Notes (Signed)
Subjective:    Patient ID: Angela [name], female    DOB: [birth year], 66 y.o.   MRN: [id number]  HPI 66 y/o BF here for a follow up visit... she has mult medical problems as listed below...   ~  Sep11:  last week she was seen in ER & adm for 1d due to CP, pressure, & palpit... she ruled out w/ neg EKG & Enz, monitor showed rare PVC & she was started on Metoprolol 12.5mg Bid... she had recent hx w/ tachypalpit at night waking her from sleep & we called in Klonopin 0.5 Bid which has helped... she is anxious & has been using Pseudophed in Mucinex-D.Marland KitchenMarland Kitchen we reviewed hosp records> H&P, DCSummary, XRays, & Labs (repeat TFTs were WNL)- refer to Cards for eval & prob Myoview, ?Holter, & reassurance... 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 ~  Dec11:  She had outpt Cards eval by Aletta Edouard w/ 2DEcho showing mild MR, norm LVF;  Stress Echo 9/11 showed normal LVF & no stress induced wall motion abnormalities;  CardioNet showed NSR w/ PVC's;  RA Dopplers were normal- no plaque & no fibromuschyperplasia... she has some palpit (PVCs) w/ dyspnea & the Clonopin 0.5mg  Bid helps... DrCrenshaw started Metoprolol 25mg Bid as well & she is stable...    She also had GI eval by DrPatterson w/ EGD 9/11 showing HH otherw normal (neg biopsies);  24H pH Probe 9/11 showed reflux in the recumbant position;  Esoph manometry 9/11 was normal;  Rx w/ Nexium daily, Carafate Prn, Levsin Prn... she is much improved now...  ~  September 17, 2010:  45mo ROV & she has kept in close contact w/ all her specialty physicians>> she is under incr stress due to father's illness (adm to [place] w/ stroke & ?cancer); BP controlled, denies cerebral ischemic symptoms; she stopped her Simva20 due to wt gain & muscle aches, she will ret for FLP> see prob list below>>    She had f/u DrCrenshaw 2/12 w/ PVC's incr at night & rec for extra Metoprolol 25mg  Prn...    On-going eval from [facility] showed an abn  gastric emptying scan c/w gastroparesis & he rec Domperidone 10mg  Tid...    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    Finally had f/u DrDaldorf as well w/ shot in shoulder for impingement & chr instability;  She sees DrDeveshwar periodically as well for her FM on mult meds including Celebrex, Vicodin, Lidoderm...  ~  March 19, 2011:  45mo ROV & her CC= cramping in her hands for which she received an injection by Ortho;  She notes "same problems" but feels they are sl better overall;  She had Flu vaccine 11/12, meds same, BMet today is wnl...    She saw DrCrenshaw 8/12> Hx AtypCP & palpit w/ hx rare PVCs on Holter, otherw w/u was entirely neg; notes incr palpit w/ stress- rec continue Metoprolol & take extra 25mg  prn...    She had f/u DrPatterson 11/12> GI motility disorder w/ gas/ bloating, chr IBS & intol to the Domperidone 10mg  w/ paresthesias; s/p GB on Colestid 2gmBid; also takes Nexium 40mg /d & Lactaid Bid w/ meals; overall stable, continue same...    She saw DrDaldorg 10/12> lft 3rd MCP synovitis injected; bilat shoulder impingements w/ prev injection therapy; Cx & Lumbar spondylosis; foot surg in past; FM w/ trigger points on Lidoderm, Celebrex...  ~  September 24, 2011:  24mo ROV & Angela [name] is c/o soreness in her ribcage & chest wall w/ a flair in her FM> on Celebrex, Vicodin, Tramadol (from Ortho for pinched nerve), Lidoderm, Parafon... She had a sinus infection treated 2/13 by TP w/ ZPak & improved...     She had GI f/u DrPatterson>  HxIBS w/ alt diarrhea/ constip; Currently on Xifaxan, Nexium, & he started "Digest" antispasmotic capsBid    She saw DrLewitt for f/u 5/13>  Right CBruit w/ CDoppler showing ?bilat 50-69% ICA stenoses; subseq MRA of the neck showed prom diffuse fibromusc changes & he discussed this w/ IR DrDeveshwar- he rec just observ due to the fact that she is asymptomatic w/o neuro  ischemic symptoms (DrLewitt offered Vasc surg consultation at a medical center); Note- he did not want to use Midrin for HAs due to it's vasoconstrictor properties...     We reviewed prob list, meds, xrays and labs> see below>> she is rec to take her Klonopin 1/2 to 1 tab Bid regularly... CXR 6/13 showed normal heart size, clear lungs, scoliosis & degen arthritis, GB clips LABS 6/13:  FLP- at goals on Colestipol;  Chems- ok x K=3.4 (K incr to 20Bid);  CBC- ok;  TSH= 0.30 on Levothy75;  VitD=52          Problem List:  HYPERTENSION (ICD-401.9) - controlled on METOPROLOL 25mg  Bid & HCT 25mg /d & K20/d...  ~  12/12:  BP=116/74  today and doing well- denies HA, visual changes, CP, palipit, dizziness, syncope, dyspnea, edema, etc... ~  6/13:  BP= 112/68 & she denies signif CP, palpit, SOB, edema, etc... ~  CXR 6/13 showed normal heart size, clear lungs, scoliosis & degen arthritis, GB clips...  CHEST PAIN, ATYPICAL (ICD-786.59) - on ASA 81mg /d... hx CWP x 25 yrs assoc w/ her FM... symptoms flair on & off, but doing OK overall & no angina. ~  9/11: developed chest "pressure" assoc w/ palpit & went to ER w/ 1d adm by TH> EKG, Enz, CXR, Labs- essent neg (K=3.2 & supplemented, had rare PVC on monitor)... referred to [facility]. ~  9/11:  seen by DrCrenshaw w/ 2DEcho showing mild MR, norm LVF;  Stress Echo 9/11 showed normal LVF & no stress induced wall motion abnormalities... ~  2/12: she had cardiac eval by DrCrenshaw w/ norm EKG, doing well overall just some palpit at night & he rec additional 25mg  Metoprolol prn.  PALPITATIONS (ICD-785.1)  <SEE ABOVE> she's noted tachypalpit & assoc w/ vivid dreams waking her from sleep... she was given METOPROLOL 25mg - 1/2 Bid in hosp 9/11 (monitor w/ PVC only) & this was incr to 25mg  Bid by DrCrenshaw... we called in KLONOPIN 0.5mg  Bid which helped the palpit as well... she was also drinking mod caffeine & taking Pseudophed> asked to stop both... ~  CardioNet 9/11 showed  NSR w/ PVC's, treated w/ BBlocker.  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 she saw DrLewitt for eval HAs & he did MRI at [facility] confirming no signif plaque & prob Fibromusc Hyperplasia... of note> she has mild HBP easily controlled on HCT & subseq RADopplers 9/11 showed normal (norm RAs, norm Ao, norm renal size etc)... she remains on ASA 81mg /d. ~  she continues to f/u w/ DrLewitt who does repeat MRI/ MRA periodically to follow. ~  RA Dopplers 9/11 were normal- no plaque &  no fibromuschyperplasia...  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 ~  5-6/13:  She saw DrLewitt for f/u>  Right CBruit w/ CDoppler showing ?bilat 50-69% ICA stenoses; subseq MRA of the neck showed prom diffuse fibromusc changes & he discussed this w/ IR DrDeveshwar- he rec just observ due to the fact that she is asymptomatic w/o neuro ischemic symptoms (DrLewitt offered Vasc surg consultation at a medical center)...  HYPERCHOLESTEROLEMIA (ICD-272.0) - prev on Simva20 but she stopped on her own due to wt gain & muscle aches... ~  FLP 6/09 showed TChol 220, TG 88, HDL 67, LDL 138... rec- start Simva20, + diet/ exercise. ~  FLP 03/13/08 showed TChol 188, TG 95, HDL, 80, LDL 89... looks great- same Rx. ~  3/10 stopped Simva20 for 1 mo due to muscle symptoms- no change off med therefore restarted. ~  FLP 9/10 on Simva20 showed TChol 160. TG 101, HDL 64, LDL 76 ~  FLP 4/11 on Simva20 showed TChol 181, TG 117, HDL 73, LDL 85 ~  FLP in hosp 9/11 showed TChol 187, TG 204, HDL 63, LDL 83 ~  She subsequently stopped Simva on her own due to wt gain & muscle aches, better off this med. ~  FLP 6/12 off Simva on diet alone showed TChol 211, TG 129, HDL 65,  LDL 126... rec better diet + exercise, she doesn't want meds. 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 currently taking LEVOTHYROID Daily... ~  labs 9/10 showed TSH= 0.19 ~  labs 4/11 showed TSH= 0.28 ~  labs 8/11 by DrPatterson showed TSH= 0.10 ~  labs 9/11 in hosp showed TSH= 0.91, FreeT3= 2.3 (2.3-4.2), FreeT4= 1.07 (0.80-1.80) ~  labs 6/12 on Levo75 showed TSH= 0.38 ~  Labs 6/13 on Levothy75 showed TSH= 0.30  HIATAL HERNIA (ICD-553.3), GERD & GASTROPARESIS - Rx NEXIUM 40mg /d, DOMPERIDONE 10mg  Tid, CARAFATE Prn (which she takes for "real bad" indigestion), PANCREASE Tid, and now probiotics (ALIGN).Marland Kitchen. ~  EGD by DrPatterson was 1/07 and showed 4cm HH, esophagitis, & duodenitis...  ~  repeat EGD 09/13/07 w/ 3cmHH, gastritis, stricture- dilated... ~  treated 6/10 for gas/ bloating/ discomfort w/ Xifaxan but no better... ~  repeat EGD 9/11 by DrPatterson showed HH, otherw normal & bx was neg... ~  Gastric Emptying scan 4/12 showed 87% retention at 1H & 58% retention at 2H (norm <30% at [facility]).  DIVERTICULAR DISEASE (ICD-562.10) IRRITABLE BOWEL SYNDROME, HX OF (ICD-V12.79) COLONIC POLYPS (ICD-211.3) -  ~  colonoscopy was 1/06 showing divertics and hems... f/u planned 5 yrs. ~  12/09 had eval for abd pain & other symptoms- CT w/ ?sm abscess- ?diverticulits... f/u colonoscopy 1/10 by DrPatterson w/ severe pan-diverticulosis but no inflamm (resolved on Cipro/ Flagyl Rx)... ~  2/11: another eval from DrPatterson for gas/ bloating/ etc> colonoscopy showed divertics & tiny hyperpl polyp removed... ~  8/11: on going eval from DrPatterson (LUQ pain & nausea)- CT Abd showed large & sm bowel divertics w/o inflamm, s/p GB & Hyst, & mild fatty liver dis...  OSTEOARTHRITIS (ICD-715.90) - sees DrDaldorf... hx bilat shoulder problems (impingement & chr instability) w/  shots & PT- see his notes. CERVICAL & LUMBAR SPONDYLOSIS FIBROMYALGIA (ICD-729.1) - DrDeveshwar tried Lyrica 50mg Tid but off this  now per DrLewitt due to blurry vision & 15# weight gain... she takes [facility] 800mg TidPrn, CELEBREX (ave 1 tab Qod), and Tylenol...  ~  she saw DrRowe yrs ago and had Rheum re-evaluation by Rober Minion 6/09- they discussed Lyrica/ Topamax/ Flexeril therapy (also rec Align for her IBS, Urology referral for her IC, supplements for her DJD, & lab work up for her fatigue)... ~  now she says DrLewitt is treating her FM> she goes to [facility] for massage & water exercises.  VITAMIN D DEFICIENCY (ICD-268.9) - on Vit D 50000 u weekly... ~  labs 6/09 showed Vit D level = 22... start 50K weekly... ~  labs 11/09 showed Vit D level = 51... asked to switch to OTC Vit D supplement but she continued the perscription. ~  Labs 6/12 showed Vit D level = 61... She prefers the 50K weekly Rx... ~  Labs 6/13 showed Vit D level = 52  MIGRAINE HEADACHE (ICD-346.90) - prev used Midrin, plus the above meds and is followed by DrLewitt... also saw him for dizziness and Dx w/ benign positional vertigo Rx w/ exercises... ~  DrLewitt doesn't want the use of Midrin due to vasoconstrictor properties...  ANXIETY (ICD-300.00) - on KLONOPIN 0.5mg  Bid...  ACNE ROSACEA (ICD-695.3)  Health Maintenance - GYN= DrARoss (on Premarin) & he does PAP smears, Mammograms, & BMD's on her...   Past Surgical History  Procedure Date  . Cholecystectomy 1985  . Vaginal hysterectomy 1993  . Cesarean section   . Foot osteotomy 2006    Right foot  . Tubal ligation 1983    Outpatient Encounter Prescriptions as of 09/24/2011  Medication Sig Dispense Refill  . aspirin 81 MG tablet Take 81 mg by mouth daily.        . Carboxymethylcellul-Glycerin (OPTIVE) 0.5-0.9 % SOLN Apply to eye as needed.        . CELEBREX 200 MG capsule TAKE 1 CAPSULE BY MOUTH EVERY DAY  30 capsule  5  . chlorzoxazone (PARAFON)  500 MG tablet Take 500 mg by mouth as needed.        . clonazePAM (KLONOPIN) 0.5 MG tablet Take 1 tablet (0.5 mg total) by mouth 2 (two) times daily as needed.  60 tablet  0  . estrogens, conjugated, (PREMARIN) 0.625 MG tablet Take 0.625 mg by mouth daily. Take daily for 21 days then do not take for 7 days.       . hydrochlorothiazide (HYDRODIURIL) 25 MG tablet TAKE 1 TABLET DAILY  90 tablet  1  . HYDROcodone-acetaminophen (VICODIN) 5-500 MG per tablet Take 1 tablet by mouth as needed. 1 tab q4-6hrs as needed for pain  60 tablet  0  . Hyoscyamine-Phenyltoloxamine 1.6109-60 MG CAPS Take 1 capsule by mouth 3 (three) times daily as needed.  90 each  3  . levothyroxine (SYNTHROID, LEVOTHROID) 75 MCG tablet Take 1 tablet (75 mcg total) by mouth daily.  90 tablet  3  . lidocaine (LIDODERM) 5 % Place 1 patch onto the skin as needed. Remove & Discard patch within 12 hours or as directed by MD       . metoprolol tartrate (LOPRESSOR) 25 MG tablet One tablet in the morning and 2 tablets in the evening  90 tablet  11  . MICRONIZED COLESTIPOL HCL 1 G tablet TAKE 1-2 TABLETS BY MOUTH DAILY  60 tablet  2  . mometasone (NASONEX) 50 MCG/ACT nasal spray Place 2 sprays into the nose as needed. 2 sprays in each nostril daily      .  NEXIUM 40 MG capsule TAKE 1 CAPSULE DAILY BEFORE BREAKFAST  30 capsule  5  . potassium chloride SA (K-DUR,KLOR-CON) 20 MEQ tablet TAKE 1 TABLET DAILY  90 tablet  1  . rifaximin (XIFAXAN) 550 MG TABS Take one tablet by mouth twice daily for 10 days.  20 tablet  0  . Triamcinolone Acetonide (NASACORT AQ NA) Place into the nose.        . Vitamin D, Ergocalciferol, (DRISDOL) 50000 UNITS CAPS TAKE ONE TABLET BY MOUTH ONCE A WEEK  4 capsule  6  . DISCONTD: isometheptene-acetaminophen-dichloralphenazone (MIDRIN) 65-325-100 MG capsule as needed.        Allergies  Allergen Reactions  . Codeine     REACTION: GI upset/vomit  . Tetracycline     REACTION: GI bleed    Current Medications,  Allergies, Past Medical History, Past Surgical History, Family History, and Social History were reviewed in [employer] record.    Review of Systems        See HPI - all other systems neg except as noted... The patient complains of decreased hearing, chest pain, dyspnea on exertion, headaches, abdominal pain, severe indigestion/heartburn, muscle weakness, and difficulty walking.  The patient denies anorexia, fever, weight loss, weight gain, vision loss, hoarseness, syncope, peripheral edema, prolonged cough, hemoptysis, melena, hematochezia, hematuria, incontinence, suspicious skin lesions, transient blindness, depression, unusual weight change, abnormal bleeding, enlarged lymph nodes, and angioedema.   Objective:   Physical Exam   WD, WN, 66 y/o BF in NAD...  Vital Signs:  Reviewed... GENERAL:  Alert & oriented; pleasant & cooperative... HEENT:  [place]/AT, EOM-full, PERRLA, EACs-clear, TMs-wnl, NOSE- rosacea rash,  THROAT-clear & wnl. NECK:  Supple w/ fair ROM; no JVD; sl decr carotid impulses w/ bilat L>R bruits ; no thyromegaly or nodules palpated; no lymphadenopathy. CHEST:  Clear to P & A; without wheezes/ rales/ or rhonchi heard... +trigger points. HEART:  Regular Rhythm; without murmurs/ rubs/ or gallops detected... ABDOMEN:  Soft & nontender; normal bowel sounds; no organomegaly or masses detected. EXT: without deformities or arthritic changes; no varicose veins/ venous insuffic/ or edema. NEURO:  no focal neuro deficits... DERM:  without lesions seen x acne.  RADIOLOGY DATA:  Reviewed in the EPIC EMR & discussed w/ the patient...  LABORATORY DATA:  Reviewed in the EPIC EMR & discussed w/ the patient...   Assessment & Plan:   HBP>  Controlled on meds, continue same...  ChestPain & Palpit>  Per DrCrenshaw & she can take extra Metoprolol 25mg  Prn for palpit...  Carotid Bruits & FIBROMUSC DYSPLASIA>  Followed by DrLewitt w/ f/u MRA 5/13 as above...  CHOL>   She stopped her Statin due to wt gain & musc aches, she will ret for FLP, may need Lipid Clinic help...  HYPOTHY>  Stable on Levothy66mcg/d...  GI> HH/ GERD/ Gastroparesis/ Divertics, IBS/ Polyps> managed by DrPatterson & now on Domperidone 10mg  Tid for the gastroparesis...  RHEUM> DJD/ FM/ etc> followed by DrDeveshwar & Daldorf as noted above...  ANXIETY>  On Klonopin 0.5mg  Bid...   Patient's Medications  New Prescriptions   No medications on file  Previous Medications   ASPIRIN 81 MG TABLET    Take 81 mg by mouth daily.     CARBOXYMETHYLCELLUL-GLYCERIN (OPTIVE) 0.5-0.9 % SOLN    Apply to eye as needed.     CELEBREX 200 MG CAPSULE    TAKE 1 CAPSULE BY MOUTH EVERY DAY   CHLORZOXAZONE (PARAFON) 500 MG TABLET    Take 500 mg  by mouth as needed.     CLONAZEPAM (KLONOPIN) 0.5 MG TABLET    Take 1 tablet (0.5 mg total) by mouth 2 (two) times daily as needed.   ESTROGENS, CONJUGATED, (PREMARIN) 0.625 MG TABLET    Take 0.625 mg by mouth daily. Take daily for 21 days then do not take for 7 days.    HYDROCHLOROTHIAZIDE (HYDRODIURIL) 25 MG TABLET    TAKE 1 TABLET DAILY   HYDROCODONE-ACETAMINOPHEN (VICODIN) 5-500 MG PER TABLET    Take 1 tablet by mouth as needed. 1 tab q4-6hrs as needed for pain   HYOSCYAMINE-PHENYLTOLOXAMINE 0.0625-15 MG CAPS    Take 1 capsule by mouth 3 (three) times daily as needed.   LEVOTHYROXINE (SYNTHROID, LEVOTHROID) 75 MCG TABLET    Take 1 tablet (75 mcg total) by mouth daily.   LIDOCAINE (LIDODERM) 5 %    Place 1 patch onto the skin as needed. Remove & Discard patch within 12 hours or as directed by MD    METOPROLOL TARTRATE (LOPRESSOR) 25 MG TABLET    One tablet in the morning and 2 tablets in the evening   MICRONIZED COLESTIPOL HCL 1 G TABLET    TAKE 1-2 TABLETS BY MOUTH DAILY   MOMETASONE (NASONEX) 50 MCG/ACT NASAL SPRAY    Place 2 sprays into the nose as needed. 2 sprays in each nostril daily   NEXIUM 40 MG CAPSULE    TAKE 1 CAPSULE DAILY BEFORE BREAKFAST   RIFAXIMIN  (XIFAXAN) 550 MG TABS    Take one tablet by mouth twice daily for 10 days.   TRIAMCINOLONE ACETONIDE (NASACORT AQ NA)    Place into the nose.     VITAMIN D, ERGOCALCIFEROL, (DRISDOL) 50000 UNITS CAPS    TAKE ONE TABLET BY MOUTH ONCE A WEEK  Modified Medications   Modified Medication Previous Medication   POTASSIUM CHLORIDE SA (K-DUR,KLOR-CON) 20 MEQ TABLET potassium chloride SA (K-DUR,KLOR-CON) 20 MEQ tablet      Take 1 tablet (20 mEq total) by mouth 2 (two) times daily.    TAKE 1 TABLET DAILY  Discontinued Medications   ISOMETHEPTENE-ACETAMINOPHEN-DICHLORALPHENAZONE (MIDRIN) 65-325-100 MG CAPSULE    as needed.   POTASSIUM CHLORIDE SA (K-DUR,KLOR-CON) 20 MEQ TABLET

## 2011-09-24 NOTE — Patient Instructions (Addendum)
Today we updated your med list in our EPIC system...    Continue your current medications the same...    We decided to increase your Potassium supplement from one daily to one tab twice daily...  We reviewed your recent blood work 7 gave you a copy for your records...    Nice job on the [employer] & same meds for now...  Call for any questions...  Let's plan a routine follow up in  6 months.Marland KitchenMarland Kitchen

## 2011-10-22 ENCOUNTER — Telehealth: Payer: Self-pay | Admitting: Pulmonary Disease

## 2011-10-22 MED ORDER — METHYLPREDNISOLONE 4 MG PO KIT: PACK | ORAL | Status: AC

## 2011-10-22 MED ORDER — AZITHROMYCIN 250 MG PO TABS: ORAL_TABLET | ORAL | Status: AC

## 2011-10-22 NOTE — Telephone Encounter (Signed)
Called, spoke with pt who c/o sinus pressure, HA, and pressure in ears x 6 days.  Also, sneezing today.  Denies PND, increased SOB, wheezing, chest tightness, chest pain, fever, or cough.  Using saline nasal spray and afrin with no relief.  Requesting rx to be called in.  Dr. Kriste Basque, pls advise.  Thank you,  [facility] [address]  Allergies verified:  Allergies  Allergen Reactions  . Codeine     REACTION: GI upset/vomit  . Tetracycline     REACTION: GI bleed

## 2011-10-22 NOTE — Telephone Encounter (Signed)
Per SN---ok to call in zpak and medrol dosepak.  Called and spoke with pt and she is aware of SN recs and is aware that these have been sent in to the pharmacy per pts request.

## 2011-10-24 ENCOUNTER — Ambulatory Visit (INDEPENDENT_AMBULATORY_CARE_PROVIDER_SITE_OTHER): Payer: Medicare Other | Admitting: Gastroenterology

## 2011-10-24 ENCOUNTER — Encounter: Payer: Self-pay | Admitting: Gastroenterology

## 2011-10-24 VITALS — BP 100/60 | HR 72 | Ht 62.0 in | Wt 157.5 lb

## 2011-10-24 DIAGNOSIS — R1013 Epigastric pain: Secondary | ICD-10-CM

## 2011-10-24 DIAGNOSIS — F419 Anxiety disorder, unspecified: Secondary | ICD-10-CM

## 2011-10-24 DIAGNOSIS — K3184 Gastroparesis: Secondary | ICD-10-CM

## 2011-10-24 DIAGNOSIS — K644 Residual hemorrhoidal skin tags: Secondary | ICD-10-CM

## 2011-10-24 DIAGNOSIS — K6289 Other specified diseases of anus and rectum: Secondary | ICD-10-CM

## 2011-10-24 DIAGNOSIS — K589 Irritable bowel syndrome without diarrhea: Secondary | ICD-10-CM

## 2011-10-24 MED ORDER — HYDROCORTISONE ACE-PRAMOXINE 2.5-1 % RE CREA: TOPICAL_CREAM | Freq: Two times a day (BID) | RECTAL | Status: AC

## 2011-10-24 MED ORDER — MOVIPREP 100 G PO SOLR: 1.0000 | Freq: Once | ORAL | Status: DC

## 2011-10-24 NOTE — Patient Instructions (Addendum)
You have been scheduled for an endoscopy and colonoscopy with propofol. Please follow the written instructions given to you at your visit today. Please pick up your prep at the pharmacy within the next 1-3 days. If you use inhalers (even only as needed), please bring them with you on the day of your procedure. We have sent the following medications to your pharmacy for you to pick up at your convenience:Analpram cream. cc: Alroy Dust, MD

## 2011-10-24 NOTE — Progress Notes (Signed)
This is a chronically ill 66 year old [place] American female with multiple medical problems and complaints. Gastrointestinal-wise she continues with epigastric discomfort made worse by eating despite previous trials of domperidone because of a delayed gastric emptying scan. Empirically this was discontinued and she now is on Digex which is an anti-spasmodic and smooth muscle relaxant. This has caused some improvement in her abdominal pain but this continues to be a severe problem for the patient. She has no typical acid reflux symptoms or dysphagia or any specific hepatobiliary complaints, and is status post cholecystectomy. She has an element of mild-salt diarrhea and is on Colestid one to 2 g a day. She now complains of rather severe anal and rectal pain with painful bowel movements. She does take when necessary Vicodin, Klonopin, Celebrex, and also Nexium 40 mg a day. She denies abuse of NSAIDs, alcohol, or cigarettes. She is followed closely by Dr. Kriste Basque in internal medicine. Review of labs from June show a normal CBC a metabolic profile. She does have carotid artery stenosis, and is followed by vascular surgery. The patient also is on Synthroid, estrogen, and a variety of multivitamins and potassium replacement. Family history is noncontributory. Last colonoscopy/endoscopy exam was 3 years ago.  Current Medications, Allergies, Past Medical History, Past Surgical History, Family History and Social History were reviewed in [employer] record.  Pertinent Review of Systems Negative   Physical Exam: Patient does not appear to be in acute distress. Blood pressure 100/60, pulse 72 and regular, and weight 157 pounds with a BMI of 28.81. I cannot appreciate stigmata of chronic liver disease. Chest is clear, she appears to be in a regular rhythm without murmurs gallops or rubs. I cannot appreciate hepatosplenomegaly, abdominal masses, or any significant localized tenderness. Bowel sounds  are normal, and there is no succussion splash. Inspection of rectum is generally unremarkable as is anoscopy exam except for some inflamed external hemorrhoids. I cannot visualize any definite fissure, fistula, and rectal exam shows no masses, tenderness, and stool guaiac negative. Mental status is normal, peripheral extremities showed no edema, phlebitis, or swollen joints.    Assessment and Plan: Very complex and difficult patient who has a history of multiple complaints. I really do not understand why she continues with such severe epigastric upper abdominal pain refractory to medical management. She's had her gallbladder removed. We will repeat her upper endoscopy exam with multiple biopsies for H. pylori. I placed her on Analpram cream locally for hemorrhoids, and we'll repeat her colonoscopy for better visualization of her lower bowel. The patient does have underlying fears of occult malignancy. I have asked her continue her Nexium, Colestid, and we will adjust her upper gastrointestinal medicines otherwise depending on her clinical workup. She in the past also is been treated for possible bacterial overgrowth syndrome without any improvement. Encounter Diagnoses  Name Primary?  . Epigastric pain Yes  . Anal or rectal pain

## 2011-10-27 ENCOUNTER — Encounter: Payer: Self-pay | Admitting: Gastroenterology

## 2011-10-27 ENCOUNTER — Ambulatory Visit (AMBULATORY_SURGERY_CENTER): Payer: Medicare Other | Admitting: Gastroenterology

## 2011-10-27 VITALS — BP 125/68 | HR 60 | Temp 96.2°F | Resp 15 | Ht 62.0 in | Wt 157.0 lb

## 2011-10-27 DIAGNOSIS — I773 Arterial fibromuscular dysplasia: Secondary | ICD-10-CM | POA: Insufficient documentation

## 2011-10-27 DIAGNOSIS — G43909 Migraine, unspecified, not intractable, without status migrainosus: Secondary | ICD-10-CM

## 2011-10-27 DIAGNOSIS — R1013 Epigastric pain: Secondary | ICD-10-CM

## 2011-10-27 DIAGNOSIS — K6289 Other specified diseases of anus and rectum: Secondary | ICD-10-CM

## 2011-10-27 DIAGNOSIS — K219 Gastro-esophageal reflux disease without esophagitis: Secondary | ICD-10-CM

## 2011-10-27 DIAGNOSIS — Z1211 Encounter for screening for malignant neoplasm of colon: Secondary | ICD-10-CM

## 2011-10-27 DIAGNOSIS — K9089 Other intestinal malabsorption: Secondary | ICD-10-CM

## 2011-10-27 DIAGNOSIS — K589 Irritable bowel syndrome without diarrhea: Secondary | ICD-10-CM

## 2011-10-27 DIAGNOSIS — K449 Diaphragmatic hernia without obstruction or gangrene: Secondary | ICD-10-CM

## 2011-10-27 MED ORDER — SODIUM CHLORIDE 0.9 % IV SOLN: 500.0000 mL | INTRAVENOUS | Status: DC

## 2011-10-27 NOTE — Patient Instructions (Addendum)
CONTINUE YOUR MEDICATIONS. HIGH FIBER DIET WITH LIBERAL FLUID INTAKE. ADD BENEFIBER OR METAMUCIL.  INCREASE YOUR NEXIUM TO TWICE DAILY. CALL DR. PATTERSON'S OFFICE TO MAKE AN APPOINTMENT WITH DR. PATTERSON IN ONE MONTH.  YOU HAD AN ENDOSCOPIC PROCEDURE TODAY AT THE [facility]: Refer to the procedure report that was given to you for any specific questions about what was found during the examination.  If the procedure report does not answer your questions, please call your gastroenterologist to clarify.  If you requested that your care partner not be given the details of your procedure findings, then the procedure report has been included in a sealed envelope for you to review at your convenience later.  YOU SHOULD EXPECT: Some feelings of bloating in the abdomen. Passage of more gas than usual.  Walking can help get rid of the air that was put into your GI tract during the procedure and reduce the bloating. If you had a lower endoscopy (such as a colonoscopy or flexible sigmoidoscopy) you may notice spotting of blood in your stool or on the toilet paper. If you underwent a bowel prep for your procedure, then you may not have a normal bowel movement for a few days.  DIET: Your first meal following the procedure should be a light meal and then it is ok to progress to your normal diet.  A half-sandwich or bowl of soup is an example of a good first meal.  Heavy or fried foods are harder to digest and may make you feel nauseous or bloated.  Likewise meals heavy in dairy and vegetables can cause extra gas to form and this can also increase the bloating.  Drink plenty of fluids but you should avoid alcoholic beverages for 24 hours.  ACTIVITY: Your care partner should take you home directly after the procedure.  You should plan to take it easy, moving slowly for the rest of the day.  You can resume normal activity the day after the procedure however you should NOT DRIVE or use heavy machinery for 24  hours (because of the sedation medicines used during the test).    SYMPTOMS TO REPORT IMMEDIATELY: A gastroenterologist can be reached at any hour.  During normal business hours, 8:30 AM to 5:00 PM Monday through Friday, call [phone].  After hours and on weekends, please call the GI answering service at [phone] who will take a message and have the physician on call contact you.   Following lower endoscopy (colonoscopy or flexible sigmoidoscopy):  Excessive amounts of blood in the stool  Significant tenderness or worsening of abdominal pains  Swelling of the abdomen that is new, acute  Fever of 100F or higher  Following upper endoscopy (EGD)  Vomiting of blood or coffee ground material  New chest pain or pain under the shoulder blades  Painful or persistently difficult swallowing  New shortness of breath  Fever of 100F or higher  Black, tarry-looking stools  FOLLOW UP: If any biopsies were taken you will be contacted by phone or by letter within the next 1-3 weeks.  Call your gastroenterologist if you have not heard about the biopsies in 3 weeks.  Our staff will call the home number listed on your records the next business day following your procedure to check on you and address any questions or concerns that you may have at that time regarding the information given to you following your procedure. This is a courtesy call and so if there is no answer at  the home number and we have not heard from you through the emergency physician on call, we will assume that you have returned to your regular daily activities without incident.  SIGNATURES/CONFIDENTIALITY: You and/or your care partner have signed paperwork which will be entered into your electronic medical record.  These signatures attest to the fact that that the information above on your After Visit Summary has been reviewed and is understood.  Full responsibility of the confidentiality of this discharge information lies with  you and/or your care-partner.

## 2011-10-27 NOTE — Progress Notes (Signed)
Patient did not experience any of the following events: a burn prior to discharge; a fall within the facility; wrong site/side/patient/procedure/implant event; or a hospital transfer or hospital admission upon discharge from the facility. (G8907) Patient did not have preoperative order for IV antibiotic SSI prophylaxis. (G8918)  

## 2011-10-27 NOTE — Op Note (Signed)
[facility] [address] [place], [place]  [address]  ENDOSCOPY PROCEDURE REPORT  PATIENT:  Angela [name], Angela [name]  MR#:  [id number] BIRTHDATE:  July 16, 1945, 66 yrs. old  GENDER:  female  ENDOSCOPIST:  [name]. Jarold Motto, MD, [facility] Referred by:  PROCEDURE DATE:  10/27/2011 PROCEDURE:  EGD with biopsy for H. pylori 19147 ASA CLASS:  Class III INDICATIONS:  early satiety, epigastric pain  MEDICATIONS:   There was residual sedation effect present from prior procedure., propofol (Diprivan) 100 mg IV TOPICAL ANESTHETIC:  DESCRIPTION OF PROCEDURE:   After the risks and benefits of the procedure were explained, informed consent was obtained.  The LB GIF-H180 [id number] endoscope was introduced through the mouth and advanced to the second portion of the duodenum.  The instrument was slowly withdrawn as the mucosa was fully examined. <<PROCEDUREIMAGES>>  Mild gastritis was found in the antrum. CLO BX. DONE  A hiatal hernia was found. LARGE 5-6 CM. PROLAPSING HH NOTED.  Normal duodenal folds were noted.  The esophagus and gastroesophageal junction were completely normal in appearance.    Retroflexed views revealed a hiatal hernia.    The scope was then withdrawn from the patient and the procedure completed.  COMPLICATIONS:  None  ENDOSCOPIC IMPRESSION: 1) Mild gastritis in the antrum 2) Hiatal hernia 3) Normal duodenal folds 4) Normal esophagus RECOMMENDATIONS: 1) Await biopsy results 2) Rx CLO if positive INCREASE NEXIUM TO BID DOSAGE.SHE IS NOT A SURGICAL CANDIDDATE PER SEVERE PVD.  ______________________________ [name]. Jarold Motto, MD, [facility]  CC:  Michele Mcalpine, MD  n. Rosalie DoctorMarland Kitchen   [name]. Easter Kennebrew at 10/27/2011 08:55 AM  Starla Link, [phone]

## 2011-10-27 NOTE — Op Note (Addendum)
[facility] [address] [place], [place]  [address]  COLONOSCOPY PROCEDURE REPORT  PATIENT:  Angela, [name]  MR#:  [id number] BIRTHDATE:  04/29/1945, 66 yrs. old  GENDER:  female ENDOSCOPIST:  [name]. Jarold Motto, MD, [facility] REF. BY: PROCEDURE DATE:  10/27/2011 PROCEDURE:  Colonoscopy 19147 ASA CLASS:  Class III INDICATIONS:  Abdominal pain, Colorectal cancer screening, average risk MEDICATIONS:   propofol (Diprivan) 150 mg IV  DESCRIPTION OF PROCEDURE:   After the risks and benefits and of the procedure were explained, informed consent was obtained. Digital rectal exam was performed and revealed no abnormalities. The LB CF-H180AL [id number] endoscope was introduced through the anus and advanced to the cecum, which was identified by both the appendix and ileocecal valve.  The quality of the prep was excellent, using MoviPrep.  The instrument was then slowly withdrawn as the colon was fully examined. <<PROCEDUREIMAGES>>  FINDINGS:  Severe diverticulosis was found throughout the colon. severe tics in right and left colon.!!!  No polyps or cancers were seen.   Retroflexed views in the rectum revealed no abnormalities. The scope was then withdrawn from the patient and the procedure completed.  COMPLICATIONS:  None ENDOSCOPIC IMPRESSION: 1) Severe diverticulosis throughout the colon 2) No polyps or cancers DIARRHEA PREDOMINANT IBS. RECOMMENDATIONS: 1) Continue current medications 2) High fiber diet with liberal fluid intake. 3) Upper Endoscopy 4) Continue current colorectal screening recommendations for "routine risk" patients with a repeat colonoscopy in 10 years. 5) metamucil or benefiber  REPEAT EXAM:  No  ______________________________ [name]. Jarold Motto, MD, [facility]  CC:  Michele Mcalpine, MD  n. REVISED:  10/27/2011 08:58 AM eSIGNED:   [name]. Patterson at 10/27/2011 08:58 AM  Starla Link, [phone]

## 2011-10-28 ENCOUNTER — Telehealth: Payer: Self-pay | Admitting: *Deleted

## 2011-10-28 LAB — HELICOBACTER PYLORI SCREEN-BIOPSY: UREASE: NEGATIVE

## 2011-10-28 NOTE — Telephone Encounter (Signed)
  Follow up Call-  Call back number 10/27/2011  Post procedure Call Back phone  # [phone]  Permission to leave phone message Yes     Patient questions:  Do you have a fever, pain , or abdominal swelling? no Pain Score  0 *  Have you tolerated food without any problems? yes  Have you been able to return to your normal activities? yes  Do you have any questions about your discharge instructions: Diet   no Medications  no Follow up visit  no  Do you have questions or concerns about your Care? no  Actions: * If pain score is 4 or above: No action needed, pain <4.

## 2011-10-29 ENCOUNTER — Encounter: Payer: Self-pay | Admitting: Gastroenterology

## 2011-12-03 ENCOUNTER — Telehealth: Payer: Self-pay | Admitting: Pulmonary Disease

## 2011-12-03 MED ORDER — MECLIZINE HCL 25 MG PO TABS: 25.0000 mg | ORAL_TABLET | ORAL | Status: AC | PRN

## 2011-12-03 NOTE — Telephone Encounter (Signed)
Per SN----she will definitely need ENT eval----call her ENT for this to get an appt and in the meantime use the epley maneuver---she can look this up on the internet--you tube--etc.   And ok to call in antivert 25 mg  #50  1 po every 4 hours prn for dizziness.  thanks

## 2011-12-03 NOTE — Telephone Encounter (Signed)
Spoke with the Angela [name] and she is c/o having increased dizziness and ringing in her ears x 2 weeks. Angela [name] states she has positional vertigo but this feeling is different. She states the dizziness is not happening when she gets up or changes positions, it is happening at random times. She states some noises are triggering the dizziness, like the humming of an [facility] unit causes her to get dizzy. She states she can be walking from room to room in her home and get dizzy. Angela [name] denies any headaches, no vision disturbances. Angela [name] states she was not sure if she should call her ENT or neurologists about this? Please advise.Carron Curie, CMA Allergies  Allergen Reactions  . Codeine     REACTION: GI upset/vomit  . Tetracycline     REACTION: GI bleed

## 2011-12-03 NOTE — Telephone Encounter (Signed)
Spoke with pt and notified of recs per SN. Pt verbalized understanding and states that nothing further needed. Rx was sent to pharm.

## 2011-12-03 NOTE — Telephone Encounter (Signed)
LMOMTCB x 1 

## 2011-12-16 ENCOUNTER — Telehealth: Payer: Self-pay | Admitting: Gastroenterology

## 2011-12-16 ENCOUNTER — Other Ambulatory Visit: Payer: Self-pay | Admitting: Pulmonary Disease

## 2011-12-16 MED ORDER — CLONAZEPAM 0.5 MG PO TABS: 0.5000 mg | ORAL_TABLET | Freq: Two times a day (BID) | ORAL | Status: DC | PRN

## 2011-12-16 NOTE — Telephone Encounter (Signed)
Received refill request from [facility] [address] for clonazepam 0.5mg  BID PRN.  Pt last seen by SN 09-24-11, follow up in 6 months > no appts pending.  Medication last refills #60 with 0 refills on 02-27-11.  Per Leigh, pt may have 1 refill but needs ov.  Refill telephoned to pharmacy on the voice mail with patient's information including her dob and telephone number as well as SN's state license and DEA numbers and that pt needs an ov for future refills; MAR updated

## 2011-12-17 MED ORDER — ESOMEPRAZOLE MAGNESIUM 40 MG PO CPDR: 40.0000 mg | DELAYED_RELEASE_CAPSULE | Freq: Two times a day (BID) | ORAL | Status: DC

## 2011-12-17 NOTE — Telephone Encounter (Signed)
Patient states [facility] is having trouble getting a 90 day supply of DigexNF from the manufacturer. They were able to give her #30. i told her to let me know if she continues to have trouble and will try to get her more samples. She also needs new prescription for Nexium bid so I sent it to her pharmacy.

## 2011-12-30 ENCOUNTER — Telehealth: Payer: Self-pay | Admitting: Gastroenterology

## 2011-12-31 MED ORDER — HYOSCYAMINE-PHENYLTOLOXAMINE 0.0625-15 MG PO CAPS: 1.0000 | ORAL_CAPSULE | Freq: Three times a day (TID) | ORAL | Status: DC | PRN

## 2011-12-31 NOTE — Telephone Encounter (Signed)
I put samples at front desk for patient to pick up.

## 2012-01-02 ENCOUNTER — Telehealth: Payer: Self-pay | Admitting: Pulmonary Disease

## 2012-01-02 MED ORDER — HYDROCHLOROTHIAZIDE 25 MG PO TABS: 25.0000 mg | ORAL_TABLET | Freq: Every day | ORAL | Status: DC

## 2012-01-02 NOTE — Telephone Encounter (Signed)
I spoke with pt and she stated she needed HCTZ called into local pharmacy and into [facility]. I have done so. Nothing further was needed

## 2012-01-20 ENCOUNTER — Telehealth: Payer: Self-pay | Admitting: Gastroenterology

## 2012-01-20 NOTE — Telephone Encounter (Signed)
Patient reports that she woke up this am with vomiting, diarrhea , and HA.  She reports 4 episodes of diarrhea.  She would like to come in and be seen today.  She is advised to stay on a clear liquid diet for today, if she can take the colestipol as previously prescribed for diarrhea.  I have scheduled her an office visit for tomorrow at 10:30 with Willette Cluster RNP

## 2012-01-21 ENCOUNTER — Ambulatory Visit (INDEPENDENT_AMBULATORY_CARE_PROVIDER_SITE_OTHER): Payer: Medicare Other | Admitting: Nurse Practitioner

## 2012-01-21 ENCOUNTER — Encounter: Payer: Self-pay | Admitting: Nurse Practitioner

## 2012-01-21 VITALS — BP 102/68 | HR 76 | Ht 62.0 in | Wt 159.4 lb

## 2012-01-21 DIAGNOSIS — K529 Noninfective gastroenteritis and colitis, unspecified: Secondary | ICD-10-CM

## 2012-01-21 DIAGNOSIS — K5289 Other specified noninfective gastroenteritis and colitis: Secondary | ICD-10-CM

## 2012-01-21 NOTE — Progress Notes (Signed)
01/21/2012 Angela [name] [id number] 02-18-1946   History of Present Illness:  Patient is a 66 year old female known to Dr. Jarold Motto for history of unexplained epigastric pain and diarrhea predominant irritable bowel syndrome.. She had an upper endoscopy July 15 of this year with findings of mild gastritis and a large 5-6 cm prolapsing hiatal hernia. CLOtest was negative. Patient is up-to-date on her screening colonoscopy, last one at time of upper endoscopy in July.  Findings included severe diverticulosis throughout the colon.  Patient is here for evaluation of acute nausea, vomiting, and crampy diarrhea. Patient went out he Monday night, Tuesday morning she woke with nausea, vomiting, lower Donald cramps and diarrhea. Stool nonbloody. Patient vomited twice, nonbloody emesis. She's had no nausea, vomiting or diarrhea since 10:00 last night. No fevers. No sick contacts. No recent antibiotics. Irritable bowel syndrome patient usually has to or so loose stools a day. She had multiple loose stools yesterday.  Current Medications, Allergies, Past Medical History, Past Surgical History, Family History and Social History were reviewed in [employer] record.   Physical Exam: General: Well developed , female in no acute distress Head: Normocephalic and atraumatic Eyes:  sclerae anicteric, conjunctiva pink  Ears: Normal auditory acuity Lungs: Clear throughout to auscultation Heart: Regular rate and rhythm Abdomen: Soft, non distended, mild bilateral lower quadrant tenderness.. No masses, no hepatomegaly. Normal bowel sounds Musculoskeletal: Symmetrical with no gross deformities  Extremities: No edema  Neurological: Alert oriented x 4, grossly nonfocal Psychological:  Alert and cooperative. Normal mood and affect  Assessment and Recommendations:   66 year old female with acute nausea, vomiting, and crampy diarrhea. Suspect acute gastroenteritis. No vomiting or diarrhea since  10 PM last night. Patient can take Imodium as needed. She will increase oral fluids. I advised her to follow a low fiber diet over the next few days. When feeling better she may resume her normal diet. Patient will call in a few days if not continuing to improve. Otherwise she can followup with  Dr. Jarold Motto as needed.

## 2012-01-21 NOTE — Patient Instructions (Addendum)
Call us if you need to follow up.

## 2012-01-22 ENCOUNTER — Encounter: Payer: Self-pay | Admitting: Nurse Practitioner

## 2012-01-23 NOTE — Progress Notes (Signed)
Agree with initial assessment and plans 

## 2012-02-16 ENCOUNTER — Ambulatory Visit (INDEPENDENT_AMBULATORY_CARE_PROVIDER_SITE_OTHER): Payer: Medicare Other

## 2012-02-16 DIAGNOSIS — Z23 Encounter for immunization: Secondary | ICD-10-CM

## 2012-02-17 ENCOUNTER — Telehealth: Payer: Self-pay | Admitting: Gastroenterology

## 2012-02-17 MED ORDER — HYOSCYAMINE-PHENYLTOLOXAMINE 0.0625-15 MG PO CAPS: 1.0000 | ORAL_CAPSULE | Freq: Three times a day (TID) | ORAL | Status: DC | PRN

## 2012-02-17 NOTE — Telephone Encounter (Signed)
Advised patient that I have a few samples we will be glad to provide her. However, per the Digex rep, Digex is being discontinued. Patient is having GI symptoms and would like to see Dr Jarold Motto at which time she will discuss other options in place of Digex.

## 2012-02-23 ENCOUNTER — Encounter: Payer: Self-pay | Admitting: *Deleted

## 2012-02-26 ENCOUNTER — Ambulatory Visit (INDEPENDENT_AMBULATORY_CARE_PROVIDER_SITE_OTHER): Payer: Medicare Other | Admitting: Gastroenterology

## 2012-02-26 ENCOUNTER — Encounter: Payer: Self-pay | Admitting: Gastroenterology

## 2012-02-26 VITALS — BP 100/60 | HR 66 | Ht 61.5 in | Wt 162.1 lb

## 2012-02-26 DIAGNOSIS — R197 Diarrhea, unspecified: Secondary | ICD-10-CM

## 2012-02-26 DIAGNOSIS — R1013 Epigastric pain: Secondary | ICD-10-CM

## 2012-02-26 DIAGNOSIS — K3184 Gastroparesis: Secondary | ICD-10-CM

## 2012-02-26 DIAGNOSIS — R140 Abdominal distension (gaseous): Secondary | ICD-10-CM

## 2012-02-26 DIAGNOSIS — K589 Irritable bowel syndrome without diarrhea: Secondary | ICD-10-CM

## 2012-02-26 DIAGNOSIS — R141 Gas pain: Secondary | ICD-10-CM

## 2012-02-26 MED ORDER — COLESTIPOL HCL 1 G PO TABS: 1.0000 g | ORAL_TABLET | Freq: Every day | ORAL | Status: DC

## 2012-02-26 MED ORDER — CILIDINIUM-CHLORDIAZEPOXIDE 2.5-5 MG PO CAPS: 1.0000 | ORAL_CAPSULE | Freq: Three times a day (TID) | ORAL | Status: DC | PRN

## 2012-02-26 NOTE — Patient Instructions (Addendum)
Continue Nexium as you are taking it   We have sent the following medications to your pharmacy for you to pick up at your convenience: Librax and Colestipol. Please take as directed

## 2012-02-26 NOTE — Progress Notes (Signed)
02/26/2012 Angela [name] [id number] 1945-11-24   History of Present Illness: This is a 66 year old African American female with multiple medical problems and several GI complaints that have been chronic.  Gastrointestinal-wise she continues with epigastric discomfort made worse by eating despite previous trials of domperidone because of a delayed gastric emptying scan.  She now is on Digex which is an anti-spasmodic and smooth muscle relaxant. This has caused some improvement in her abdominal pain but the medication is no longer being made, therefore she is going to need something to replace it.  Says that she notices that she can only eat small amounts at a time, which she has been doing.  Denies nausea or vomiting.  Complains of a lot of bloating.  She has been treated with xifaxan for SIBO in the past without any improvement.  She is status post cholecystectomy. She has an element of mild-salt diarrhea and is on Colestid one to 2 g a day, however, she has not been taking it regularly.  Denies bloody stools.  Says that the diarrhea is mostly in the morning, but is controlling her life because she is afraid to go places.  Family history is noncontributory. Last colonoscopy/endoscopy exam was in July of this year.  Colonoscopy revealed severe diverticulosis throughout the colon.  EGD showed mild gastritis with biopsies negative for Hpylori.    Current Medications, Allergies, Past Medical History, Past Surgical History, Family History and Social History were reviewed in [employer] record.   Physical Exam: General: Well developed , well nourished, in NAD. Head: Normocephalic and atraumatic. Eyes:  sclerae anicteric, conjunctiva pink. Ears: Normal auditory acuity. Lungs: Clear throughout to auscultation. Heart: Regular rate and rhythm. Abdomen: Soft, non-distended.  BS normal.  Epigastric and LUQ TTP without R/R/G.  Open cholecystectomy scar noted. Rectal:  Deferred. Musculoskeletal: Symmetrical with no gross deformities  Extremities: No edema. Neurological: Alert oriented x 4, grossly nonfocal. Psychological:  Alert and cooperative. Normal mood and affect  Assessment and Recommendations: Very complex and difficult patient who has a history of multiple complaints.  She's had her gallbladder removed.  Upper endoscopy exam with multiple biopsies for H. Pylori was negative.  Colonoscopy showed severe diverticulosis throughout the colon.  She seems to be improved on the Digex, but since they are no longer making that we will switch to Librax 1-2 TID prn.  Once again, she is not taking the colestid regularly and was encouraged to do so.  Continue Nexium as she is currently taking it.

## 2012-02-27 NOTE — Progress Notes (Signed)
This patient this patient is having one soft bowel movement a day and not true diarrhea.  She has classical IBS, and we will change her to Librax 3 times a day since she had excellent response with Digex medication that is been withdrawn from the market.  I do not think she needs further GI evaluation at this time, and agree with the assessment of our physician assistant as per above note.

## 2012-03-03 ENCOUNTER — Other Ambulatory Visit: Payer: Self-pay | Admitting: Pulmonary Disease

## 2012-03-09 ENCOUNTER — Encounter: Payer: Self-pay | Admitting: *Deleted

## 2012-03-10 ENCOUNTER — Ambulatory Visit (INDEPENDENT_AMBULATORY_CARE_PROVIDER_SITE_OTHER): Payer: Medicare Other | Admitting: Pulmonary Disease

## 2012-03-10 ENCOUNTER — Encounter: Payer: Self-pay | Admitting: Pulmonary Disease

## 2012-03-10 VITALS — BP 110/78 | HR 68 | Temp 97.0°F | Ht 61.5 in | Wt 164.0 lb

## 2012-03-10 DIAGNOSIS — K219 Gastro-esophageal reflux disease without esophagitis: Secondary | ICD-10-CM

## 2012-03-10 DIAGNOSIS — IMO0001 Reserved for inherently not codable concepts without codable children: Secondary | ICD-10-CM

## 2012-03-10 DIAGNOSIS — E559 Vitamin D deficiency, unspecified: Secondary | ICD-10-CM

## 2012-03-10 DIAGNOSIS — K5730 Diverticulosis of large intestine without perforation or abscess without bleeding: Secondary | ICD-10-CM

## 2012-03-10 DIAGNOSIS — I10 Essential (primary) hypertension: Secondary | ICD-10-CM

## 2012-03-10 DIAGNOSIS — F411 Generalized anxiety disorder: Secondary | ICD-10-CM

## 2012-03-10 DIAGNOSIS — I773 Arterial fibromuscular dysplasia: Secondary | ICD-10-CM

## 2012-03-10 DIAGNOSIS — E039 Hypothyroidism, unspecified: Secondary | ICD-10-CM

## 2012-03-10 DIAGNOSIS — K3184 Gastroparesis: Secondary | ICD-10-CM

## 2012-03-10 DIAGNOSIS — M1990 Unspecified osteoarthritis, unspecified site: Secondary | ICD-10-CM

## 2012-03-10 DIAGNOSIS — E785 Hyperlipidemia, unspecified: Secondary | ICD-10-CM

## 2012-03-10 DIAGNOSIS — K589 Irritable bowel syndrome without diarrhea: Secondary | ICD-10-CM

## 2012-03-10 DIAGNOSIS — I7789 Other specified disorders of arteries and arterioles: Secondary | ICD-10-CM

## 2012-03-10 DIAGNOSIS — R002 Palpitations: Secondary | ICD-10-CM

## 2012-03-10 MED ORDER — MECLIZINE HCL 25 MG PO TABS: 25.0000 mg | ORAL_TABLET | ORAL | Status: DC | PRN

## 2012-03-10 NOTE — Progress Notes (Signed)
Subjective:    Patient ID: Angela [name], female    DOB: [birth year], 66 y.o.   MRN: [id number]  HPI 66 y/o BF here for a follow up visit... she has mult medical problems as listed below...   ~  September 17, 2010:  52mo ROV & she has kept in close contact w/ all Angela [name] specialty physicians>> she is under incr stress due to father's illness (adm to [place] w/ stroke & ?cancer); BP controlled, denies cerebral ischemic symptoms; she stopped Angela [name] Simva20 due to wt gain & muscle aches, she will ret for FLP> see prob list below>>    She had f/u DrCrenshaw 2/12 w/ PVC's incr at night & rec for extra Metoprolol 25mg  Prn...    On-going eval from [facility] showed an abn gastric emptying scan c/w gastroparesis & he rec Domperidone 10mg  Tid...    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    Finally had f/u DrDaldorf as well w/ shot in shoulder for impingement & chr instability;  She sees DrDeveshwar periodically as well for Angela [name] FM on mult meds including Celebrex, Vicodin, Lidoderm...  ~  March 19, 2011:  52mo ROV & Angela [name] CC= cramping in Angela [name] hands for which she received an injection by Ortho;  She notes "same problems" but feels they are sl better overall;  She had Flu vaccine 11/12, meds same, BMet today is wnl...    She saw DrCrenshaw 8/12> Hx AtypCP & palpit w/ hx rare PVCs on Holter, otherw w/u was entirely neg; notes incr palpit w/ stress- rec continue Metoprolol & take extra 25mg  prn...    She had f/u DrPatterson 11/12> GI motility disorder w/ gas/ bloating, chr IBS & intol to the Domperidone 10mg  w/ paresthesias; s/p GB on Colestid 2gmBid; also takes Nexium 40mg /d & Lactaid Bid w/ meals; overall stable, continue same...    She saw DrDaldorg 10/12> lft 3rd MCP synovitis injected; bilat shoulder impingements w/ prev injection therapy; Cx & Lumbar spondylosis; foot surg in past; FM w/ trigger points on Lidoderm,  Celebrex...  ~  September 24, 2011:  52mo ROV & [name] is c/o soreness in Angela [name] ribcage & chest wall w/ a flair in Angela [name] FM> on Celebrex, Vicodin, Tramadol (from Ortho for pinched nerve), Lidoderm, Parafon... She had a sinus infection treated 2/13 by TP w/ ZPak & improved...     She had GI f/u DrPatterson>  HxIBS w/ alt diarrhea/ constip; Currently on Xifaxan, Nexium, & he started "Digest" antispasmotic capsBid    She saw DrLewitt for f/u 5/13>  Right CBruit w/ CDoppler showing ?bilat 50-69% ICA stenoses; subseq MRA of the neck showed prom diffuse fibromusc changes & he discussed this w/ IR DrDeveshwar- he rec just observ due to the fact that she is asymptomatic w/o neuro ischemic symptoms (DrLewitt offered Vasc surg consultation at a medical center); Note- he did not want to use Midrin for HAs due to it's vasoconstrictor properties...     We reviewed prob list, meds, xrays and labs> see below>> she is rec to take Angela [name] Klonopin 1/2 to 1 tab Bid regularly... CXR 6/13 showed normal heart size, clear lungs, scoliosis & degen arthritis, GB clips LABS 6/13:  FLP- at goals on Colestipol;  Chems- ok x K=3.4 (K incr to 20Bid);  CBC- ok;  TSH= 0.30 on Levothy75;  VitD=52  ~  March 10, 2012:  52mo ROV & [name] continues to see mult specialists for  Angela [name] medical problems...    Dizziness, AtypCP, Palpit> on Meclizine25, ASA81, Klonopin0.5; notes 1epis dizziness, drainage, congestion- refilled Mecliz, Mucinex, Saline...    HBP> on Metop25Bid, Hct25, K20; BP=110/78 7 denies CP, palpit, SOB, edema, etc...    CBruit, Fibromusc Hyperplasia> followed by DrLewitt & TDeveshwar on observ- f/u Q98mo & he has been doing MRA Neck & CDuplex Q8mo- see below...    CHOL> on diet & Colestipol; FLP 6/13 showed TChol 180, TG 121, HDL 63, LDL 93    Hypothy> on Levo75; TSH 6/13 = 0.30 & she denies over or under symptoms...    HH, GERD, Gastroparesis> on Nexium40Bid; c/o epig pain- she had EGD 7/13 by DrPatterson- large 5-6cmHH, gastitis, bx  neg for HPylori...    Divertics, IBS-D, Polyps> on Librax prn; she had colonoscopy 7/13 DrPatterson- severe divertic throughout colon, Rec- hi fiber...    S/P GB w/ bile salt diarrhea> on Colestid- supposed to take 2gm regularly...    DJD, FM> on Celebrex, Tramadol, Parafon; she has mult somatic complaints...    VitD defic> on VitD 50K wkly; Vit D level 6/13 = 52...    Anxiety> on Klonopin0.5Bid prn; encouraged to take more regularly... We reviewed prob list, meds, xrays and labs> see below for updates >>           Problem List:  HYPERTENSION (ICD-401.9) - controlled on METOPROLOL 25mg  Bid & HCT 25mg /d & K20/d...  ~  12/12:  BP=116/74  today and doing well- denies HA, visual changes, CP, palipit, dizziness, syncope, dyspnea, edema, etc... ~  6/13:  BP= 112/68 & she denies signif CP, palpit, SOB, edema, etc... ~  CXR 6/13 showed normal heart size, clear lungs, scoliosis & degen arthritis, GB clips... ~  11/13:  BP= 110/78 & she denies specific CP, palpit, SOB, edema...  CHEST PAIN, ATYPICAL (ICD-786.59) - on ASA 81mg /d... hx CWP x 25 yrs assoc w/ Angela [name] FM... symptoms flair on & off, but doing OK overall & no angina. ~  9/11: developed chest "pressure" assoc w/ palpit & went to ER w/ 1d adm by TH> EKG, Enz, CXR, Labs- essent neg (K=3.2 & supplemented, had rare PVC on monitor)... referred to [facility]. ~  9/11:  seen by DrCrenshaw w/ 2DEcho showing mild MR, norm LVF;  Stress Echo 9/11 showed normal LVF & no stress induced wall motion abnormalities... ~  2/12: she had cardiac eval by DrCrenshaw w/ norm EKG, doing well overall just some palpit at night & he rec additional 25mg  Metoprolol prn.  PALPITATIONS (ICD-785.1)  <SEE ABOVE> she's noted tachypalpit & assoc w/ vivid dreams waking Angela [name] from sleep... she was given METOPROLOL 25mg - 1/2 Bid in hosp 9/11 (monitor w/ PVC only) & this was incr to 25mg  Bid by DrCrenshaw... we called in KLONOPIN 0.5mg  Bid which helped the palpit as well... she was also  drinking mod caffeine & taking Pseudophed> asked to stop both... ~  CardioNet 9/11 showed NSR w/ PVC's, treated w/ BBlocker.  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 she saw DrLewitt for eval HAs & he did MRI at [facility] confirming no signif plaque & prob Fibromusc Hyperplasia... of note> she has mild HBP easily controlled on HCT & subseq RADopplers 9/11 showed normal (norm RAs, norm Ao, norm renal size etc)... she remains on ASA 81mg /d. ~  she continues to f/u w/ DrLewitt who does  repeat MRI/ MRA periodically to follow. ~  RA Dopplers 9/11 were normal- no plaque & no fibromuschyperplasia...  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 ~  5-6/13:  She saw DrLewitt for f/u>  Right CBruit w/ CDoppler showing ?bilat 50-69% ICA stenoses; subseq MRA of the neck showed prom diffuse fibromusc changes & he discussed this w/ IR DrDeveshwar- he rec just observ due to the fact that she is asymptomatic w/o neuro ischemic symptoms (DrLewitt offered Vasc surg consultation at a medical center)... ~  11/13:  She is due for f/u studies per DrLewitt soon...  HYPERCHOLESTEROLEMIA (ICD-272.0) - prev on Simva20 but she stopped on Angela [name] own due to wt gain & muscle aches... ~  FLP 6/09 showed TChol 220, TG 88, HDL 67, LDL 138... rec- start Simva20, + diet/ exercise. ~  FLP 03/13/08 showed TChol 188, TG 95, HDL, 80, LDL 89... looks great- same Rx. ~  3/10 stopped Simva20 for 1 mo due to muscle symptoms- no change off med therefore restarted. ~  FLP 9/10 on Simva20 showed TChol 160. TG 101, HDL 64, LDL 76 ~  FLP 4/11 on Simva20 showed TChol 181, TG 117, HDL 73, LDL 85 ~  FLP in hosp 9/11 showed TChol 187, TG 204, HDL 63, LDL 83 ~  She  subsequently stopped Simva on Angela [name] own due to wt gain & muscle aches, better off this med. ~  FLP 6/12 off Simva on diet alone showed TChol 211, TG 129, HDL 65, LDL 126... rec better diet + exercise, she doesn't want meds. 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 currently taking LEVOTHYROID Daily... ~  labs 9/10 showed TSH= 0.19 ~  labs 4/11 showed TSH= 0.28 ~  labs 8/11 by DrPatterson showed TSH= 0.10 ~  labs 9/11 in hosp showed TSH= 0.91, FreeT3= 2.3 (2.3-4.2), FreeT4= 1.07 (0.80-1.80) ~  labs 6/12 on Levo75 showed TSH= 0.38 ~  Labs 6/13 on Levothy75 showed TSH= 0.30  HIATAL HERNIA (ICD-553.3), GERD & GASTROPARESIS - Rx NEXIUM 40mg /d, DOMPERIDONE 10mg  Tid, CARAFATE Prn (which she takes for "real bad" indigestion), PANCREASE Tid, and now probiotics (ALIGN).Marland Kitchen. ~  EGD by DrPatterson was 1/07 and showed 4cm HH, esophagitis, & duodenitis...  ~  repeat EGD 09/13/07 w/ 3cmHH, gastritis, stricture- dilated... ~  treated 6/10 for gas/ bloating/ discomfort w/ Xifaxan but no better... ~  repeat EGD 9/11 by DrPatterson showed HH, otherw normal & bx was neg... ~  Gastric Emptying scan 4/12 showed 87% retention at 1H & 58% retention at 2H (norm <30% at [facility]). ~  EGD 7/13 by DrPatterson showed a large 5-6cmHH, gastitis, bx neg for HPylori, Rec- continue same.  DIVERTICULAR DISEASE (ICD-562.10) IRRITABLE BOWEL SYNDROME, HX OF (ICD-V12.79) COLONIC POLYPS (ICD-211.3) -  ~  colonoscopy was 1/06 showing divertics and hems... f/u planned 5 yrs. ~  12/09 had eval for abd pain & other symptoms- CT w/ ?sm abscess- ?diverticulits... f/u colonoscopy 1/10 by DrPatterson w/ severe pan-diverticulosis but no inflamm (resolved on Cipro/ Flagyl Rx)... ~  2/11: another eval from DrPatterson for gas/ bloating/ etc> colonoscopy showed divertics & tiny hyperpl polyp removed... ~  8/11:  on going eval from DrPatterson (LUQ pain & nausea)- CT Abd showed large & sm bowel  divertics w/o inflamm, s/p GB & Hyst, & mild fatty liver dis... ~  Colonoscopy 7/13 by DrPatterson showed severe divertic throughout colon, Rec- hi fiber.  OSTEOARTHRITIS (ICD-715.90) - sees DrDaldorf... hx bilat shoulder problems (impingement & chr instability) w/ shots & PT- see his notes. CERVICAL & LUMBAR SPONDYLOSIS FIBROMYALGIA (ICD-729.1) - DrDeveshwar tried Lyrica 50mg Tid but off this now per DrLewitt due to blurry vision & 15# weight gain... she takes SKELAXIN 800mg TidPrn, CELEBREX (ave 1 tab Qod), and Tylenol...  ~  she saw DrRowe yrs ago and had Rheum re-evaluation by Rober Minion 6/09- they discussed Lyrica/ Topamax/ Flexeril therapy (also rec Align for Angela [name] IBS, Urology referral for Angela [name] IC, supplements for Angela [name] DJD, & lab work up for Angela [name] fatigue)... ~  now she says DrLewitt is treating Angela [name] FM> she goes to [facility] for massage & water exercises.  VITAMIN D DEFICIENCY (ICD-268.9) - on Vit D 50000 u weekly... ~  labs 6/09 showed Vit D level = 22... start 50K weekly... ~  labs 11/09 showed Vit D level = 51... asked to switch to OTC Vit D supplement but she continued the perscription. ~  Labs 6/12 showed Vit D level = 61... She prefers the 50K weekly Rx... ~  Labs 6/13 showed Vit D level = 52  MIGRAINE HEADACHE (ICD-346.90) - prev used Midrin, plus the above meds and is followed by DrLewitt... also saw him for dizziness and Dx w/ benign positional vertigo Rx w/ exercises... ~  DrLewitt doesn't want the use of Midrin due to vasoconstrictor properties...  ANXIETY (ICD-300.00) - on KLONOPIN 0.5mg  Bid...  ACNE ROSACEA (ICD-695.3)  Health Maintenance - GYN= DrARoss (on Premarin) & he does PAP smears, Mammograms, & BMD's on Angela [name]...   Past Surgical History  Procedure Date  . Cholecystectomy 1985  . Vaginal hysterectomy 1993  . Cesarean section   . Foot osteotomy 2006    Right foot  . Tubal  ligation 1983  . Bunionectomy 09/04/2011    left    Outpatient Encounter Prescriptions as of 03/10/2012  Medication Sig Dispense Refill  . AMBULATORY NON FORMULARY MEDICATION Lidocaine HCL Isotonic (45ml) 4% Nasal Spray As needed      . aspirin 81 MG tablet Take 81 mg by mouth daily.        . Carboxymethylcellul-Glycerin (OPTIVE) 0.5-0.9 % SOLN Apply to eye as needed.        . CELEBREX 200 MG capsule TAKE 1 CAPSULE BY MOUTH EVERY DAY  30 capsule  5  . chlorzoxazone (PARAFON) 500 MG tablet Take 500 mg by mouth as needed.        . clidinium-chlordiazePOXIDE (LIBRAX) 2.5-5 MG per capsule Take 1 capsule by mouth 3 (three) times daily as needed.  60 capsule  3  . clonazePAM (KLONOPIN) 0.5 MG tablet Take 1 tablet (0.5 mg total) by mouth 2 (two) times daily as needed.  60 tablet  0  . colestipol (MICRONIZED COLESTIPOL HCL) 1 G tablet Take 1 tablet (1 g total) by mouth daily.  60 tablet  2  . esomeprazole (NEXIUM) 40 MG capsule Take 1 capsule (40 mg total) by mouth 2 (two) times daily.  60 capsule  10  . estrogens, conjugated, (PREMARIN) 0.625 MG tablet Take 0.625 mg by mouth daily. Take daily for 21 days then do not take for 7 days.       . hydrochlorothiazide (HYDRODIURIL) 25 MG tablet Take 1 tablet (25 mg total) by mouth daily.  90 tablet  2  . Hyoscyamine-Phenyltoloxamine [phone]  MG CAPS Take 1 capsule by mouth 3 (three) times daily as needed.  48 each  0  . levothyroxine (SYNTHROID, LEVOTHROID) 75 MCG tablet Take 1 tablet (75 mcg total) by mouth daily.  90 tablet  3  . lidocaine (LIDODERM) 5 % Place 1 patch onto the skin as needed. Remove & Discard patch within 12 hours or as directed by MD       . meclizine (ANTIVERT) 25 MG tablet Take 25 mg by mouth every 4 (four) hours as needed.      . metoprolol tartrate (LOPRESSOR) 25 MG tablet One tablet in the morning and 2 tablets in the evening  90 tablet  11  . mometasone (NASONEX) 50 MCG/ACT nasal spray Place 2 sprays into the nose as needed. 2  sprays in each nostril daily      . potassium chloride SA (K-DUR,KLOR-CON) 20 MEQ tablet Take 1 tablet (20 mEq total) by mouth 2 (two) times daily.  60 tablet  11  . traMADol (ULTRAM) 50 MG tablet       . Triamcinolone Acetonide (NASACORT AQ NA) Place into the nose.        . Vitamin D, Ergocalciferol, (DRISDOL) 50000 UNITS CAPS TAKE ONE TABLET BY MOUTH ONCE A WEEK  4 capsule  6    Allergies  Allergen Reactions  . Codeine     REACTION: GI upset/vomit  . Tetracycline     REACTION: GI bleed    Current Medications, Allergies, Past Medical History, Past Surgical History, Family History, and Social History were reviewed in [employer] record.    Review of Systems        See HPI - all other systems neg except as noted... The patient complains of decreased hearing, chest pain, dyspnea on exertion, headaches, abdominal pain, severe indigestion/heartburn, muscle weakness, and difficulty walking.  The patient denies anorexia, fever, weight loss, weight gain, vision loss, hoarseness, syncope, peripheral edema, prolonged cough, hemoptysis, melena, hematochezia, hematuria, incontinence, suspicious skin lesions, transient blindness, depression, unusual weight change, abnormal bleeding, enlarged lymph nodes, and angioedema.   Objective:   Physical Exam   WD, WN, 66 y/o BF in NAD...  Vital Signs:  Reviewed... GENERAL:  Alert & oriented; pleasant & cooperative... HEENT:  [place]/AT, EOM-full, PERRLA, EACs-clear, TMs-wnl, NOSE- rosacea rash,  THROAT-clear & wnl. NECK:  Supple w/ fair ROM; no JVD; sl decr carotid impulses w/ bilat L>R bruits ; no thyromegaly or nodules palpated; no lymphadenopathy. CHEST:  Clear to P & A; without wheezes/ rales/ or rhonchi heard... +trigger points. HEART:  Regular Rhythm; without murmurs/ rubs/ or gallops detected... ABDOMEN:  Soft & nontender; normal bowel sounds; no organomegaly or masses detected. EXT: without deformities or arthritic changes; no  varicose veins/ venous insuffic/ or edema. NEURO:  no focal neuro deficits... DERM:  without lesions seen x acne.  RADIOLOGY DATA:  Reviewed in the EPIC EMR & discussed w/ the patient...  LABORATORY DATA:  Reviewed in the EPIC EMR & discussed w/ the patient...   Assessment & Plan:    HBP>  Controlled on meds, continue same...  ChestPain & Palpit>  Per DrCrenshaw & she can take extra Metoprolol 25mg  Prn for palpit...  Carotid Bruits & FIBROMUSC DYSPLASIA>  Followed by DrLewitt w/ f/u MRA 5/13 as above...  CHOL>  She stopped Angela [name] Statin due to wt gain & musc aches, FLP 6/13 within parameters...  HYPOTHY>  Stable on Levothy18mcg/d...  GI> HH/ GERD/ Gastroparesis/ Divertics, IBS/ Polyps> managed by DrPatterson &  EGD 7/13 as above, continue Nexium; Colonoscopy as above- rec hi fiber...  RHEUM> DJD/ FM/ etc> followed by DrDeveshwar & Daldorf as noted above...  ANXIETY>  On Klonopin 0.5mg  Bid...   Patient's Medications  New Prescriptions   No medications on file  Previous Medications   AMBULATORY NON FORMULARY MEDICATION    Lidocaine HCL Isotonic (45ml) 4% Nasal Spray As needed   ASPIRIN 81 MG TABLET    Take 81 mg by mouth daily.     CARBOXYMETHYLCELLUL-GLYCERIN (OPTIVE) 0.5-0.9 % SOLN    Apply to eye as needed.     CELEBREX 200 MG CAPSULE    TAKE 1 CAPSULE BY MOUTH EVERY DAY   CHLORZOXAZONE (PARAFON) 500 MG TABLET    Take 500 mg by mouth as needed.     CLIDINIUM-CHLORDIAZEPOXIDE (LIBRAX) 2.5-5 MG PER CAPSULE    Take 1 capsule by mouth 3 (three) times daily as needed.   CLONAZEPAM (KLONOPIN) 0.5 MG TABLET    Take 1 tablet (0.5 mg total) by mouth 2 (two) times daily as needed.   COLESTIPOL (MICRONIZED COLESTIPOL HCL) 1 G TABLET    Take 1 tablet (1 g total) by mouth daily.   ESOMEPRAZOLE (NEXIUM) 40 MG CAPSULE    Take 1 capsule (40 mg total) by mouth 2 (two) times daily.   ESTROGENS, CONJUGATED, (PREMARIN) 0.625 MG TABLET    Take 0.625 mg by mouth daily. Take daily for 21 days then  do not take for 7 days.    HYDROCHLOROTHIAZIDE (HYDRODIURIL) 25 MG TABLET    Take 1 tablet (25 mg total) by mouth daily.   HYOSCYAMINE-PHENYLTOLOXAMINE 6.9629-52 MG CAPS    Take 1 capsule by mouth 3 (three) times daily as needed.   LEVOTHYROXINE (SYNTHROID, LEVOTHROID) 75 MCG TABLET    Take 1 tablet (75 mcg total) by mouth daily.   LIDOCAINE (LIDODERM) 5 %    Place 1 patch onto the skin as needed. Remove & Discard patch within 12 hours or as directed by MD    METOPROLOL TARTRATE (LOPRESSOR) 25 MG TABLET    One tablet in the morning and 2 tablets in the evening   MOMETASONE (NASONEX) 50 MCG/ACT NASAL SPRAY    Place 2 sprays into the nose as needed. 2 sprays in each nostril daily   POTASSIUM CHLORIDE SA (K-DUR,KLOR-CON) 20 MEQ TABLET    Take 1 tablet (20 mEq total) by mouth 2 (two) times daily.   TRAMADOL (ULTRAM) 50 MG TABLET       TRIAMCINOLONE ACETONIDE (NASACORT AQ NA)    Place into the nose.     VITAMIN D, ERGOCALCIFEROL, (DRISDOL) 50000 UNITS CAPS    TAKE ONE TABLET BY MOUTH ONCE A WEEK  Modified Medications   Modified Medication Previous Medication   MECLIZINE (ANTIVERT) 25 MG TABLET meclizine (ANTIVERT) 25 MG tablet      Take 1 tablet (25 mg total) by mouth every 4 (four) hours as needed.    Take 25 mg by mouth every 4 (four) hours as needed.  Discontinued Medications   No medications on file

## 2012-03-10 NOTE — Patient Instructions (Addendum)
Today we updated your med list in our EPIC system...    Continue your current medications the same...  We refilled your ANTIVERT (Meclizine) to use as needed for dizziness...  For the congestion:    Use the OTC MUCINEX- 1-2 tabs twice daily w/ lots of fluids...    And the NASAL SALINE MIST- spray each nostril every 1-2 H as needed...  Call for any problems...  Let's plan a follow up visit w/ FASTING blood work in 6 months.Marland KitchenMarland Kitchen

## 2012-03-18 ENCOUNTER — Telehealth: Payer: Self-pay | Admitting: Cardiology

## 2012-03-18 ENCOUNTER — Other Ambulatory Visit: Payer: Self-pay | Admitting: Gastroenterology

## 2012-03-18 NOTE — Telephone Encounter (Signed)
New Problem: ° ° ° °I called the patient and was unable to reach them. I left a message on their voicemail with my name, the reason I called, the name of their physician, and a number to call back to schedule their appointment. ° °

## 2012-03-18 NOTE — Telephone Encounter (Signed)
RX was at pharmacy.  Patient was notified

## 2012-04-05 ENCOUNTER — Other Ambulatory Visit: Payer: Self-pay | Admitting: Pulmonary Disease

## 2012-04-09 ENCOUNTER — Other Ambulatory Visit: Payer: Self-pay | Admitting: Neurology

## 2012-04-09 DIAGNOSIS — I773 Arterial fibromuscular dysplasia: Secondary | ICD-10-CM

## 2012-04-20 ENCOUNTER — Other Ambulatory Visit: Payer: Medicare Other

## 2012-05-03 ENCOUNTER — Ambulatory Visit
Admission: RE | Admit: 2012-05-03 | Discharge: 2012-05-03 | Disposition: A | Discharge status: Home / Self Care | Payer: Medicare PPO | Source: Ambulatory Visit | Attending: Neurology | Admitting: Neurology

## 2012-05-03 DIAGNOSIS — I773 Arterial fibromuscular dysplasia: Secondary | ICD-10-CM

## 2012-05-03 MED ORDER — GADOBENATE DIMEGLUMINE 529 MG/ML IV SOLN
15.0000 mL | Freq: Once | INTRAVENOUS | Status: AC | PRN
  Administered 2012-05-03: 15 mL via INTRAVENOUS

## 2012-05-11 ENCOUNTER — Ambulatory Visit: Payer: Medicare Other | Admitting: Cardiology

## 2012-05-24 ENCOUNTER — Other Ambulatory Visit: Payer: Self-pay | Admitting: Pulmonary Disease

## 2012-05-26 ENCOUNTER — Telehealth: Payer: Self-pay | Admitting: Pulmonary Disease

## 2012-05-26 MED ORDER — METHYLPREDNISOLONE 4 MG PO KIT: PACK | ORAL | Status: DC

## 2012-05-26 MED ORDER — AMOXICILLIN-POT CLAVULANATE 875-125 MG PO TABS: 1.0000 | ORAL_TABLET | Freq: Two times a day (BID) | ORAL | Status: DC

## 2012-05-26 NOTE — Telephone Encounter (Signed)
Called and spoke with pt and she is aware of SN recs and of the meds that have been sent to her pharmacy.  Nothing further is needed.

## 2012-05-26 NOTE — Telephone Encounter (Signed)
I spoke with pt. She c/o nasal congestion, PND, cough w/ yellow phlem, chest tx, facial pressure left side, HA, sneezing, scratchy throat x Monday. No f/c/s/n/v. Taking coricidin. Requesting to having something called in. Please advise SN thanks [facility] [address]  Last OV 02/29/12 Pending 5/2-14 Allergies  Allergen Reactions  . Tetracycline Other (See Comments)    GI bleed  . Codeine Nausea And Vomiting    GI upset/vomit

## 2012-05-26 NOTE — Telephone Encounter (Signed)
Per SN---increase fluids, medrol dosepak  #1  Take as directed  , augmentin 875 mg   #14  1 po bid and use mucinex 600 mg  2 po bid with plenty of fluids.

## 2012-06-09 ENCOUNTER — Other Ambulatory Visit: Payer: Self-pay | Admitting: Pulmonary Disease

## 2012-06-14 ENCOUNTER — Ambulatory Visit (INDEPENDENT_AMBULATORY_CARE_PROVIDER_SITE_OTHER): Payer: Medicare PPO | Admitting: Adult Health

## 2012-06-14 ENCOUNTER — Encounter: Payer: Self-pay | Admitting: Adult Health

## 2012-06-14 VITALS — BP 128/30 | HR 86 | Temp 98.3°F | Ht 62.0 in | Wt 166.8 lb

## 2012-06-14 DIAGNOSIS — J019 Acute sinusitis, unspecified: Secondary | ICD-10-CM

## 2012-06-14 MED ORDER — HYDROCHLOROTHIAZIDE 25 MG PO TABS: 25.0000 mg | ORAL_TABLET | Freq: Every day | ORAL | Status: DC

## 2012-06-14 MED ORDER — LEVOTHYROXINE SODIUM 75 MCG PO TABS: ORAL_TABLET | ORAL | Status: DC

## 2012-06-14 NOTE — Patient Instructions (Addendum)
Saline nasal rinses twice daily as needed. Restart Nasonex 2 puffs twice daily. Claritin 10 mg daily as needed. For drainage. May use Debrox ear wax solution as needed. Follow up Dr. Kriste Basque as planned or as needed Please contact office for sooner follow up if symptoms do not improve or worsen or seek emergency care

## 2012-06-14 NOTE — Assessment & Plan Note (Addendum)
Resolving sinusitis w/ lingering rhinitis symptoms   Plan  Saline nasal rinses twice daily as needed. Restart Nasonex 2 puffs twice daily. Claritin 10 mg daily as needed. For drainage. May use Debrox ear wax solution as needed. Follow up Dr. Kriste Basque as planned or as needed Please contact office for sooner follow up if symptoms do not improve or worsen or seek emergency care

## 2012-06-14 NOTE — Progress Notes (Signed)
Subjective:    Patient ID: Angela [name], female    DOB: 07/03/1945, 67 y.o.   MRN: [id number]  HPI 67 y/o BF   06/14/2012 Acute OV  Complains of still having scratchy throat, tenderness in throat and head/ear congestion, HA/dizziness for 3 weeks.  Was called in medrol dose pak and augmentin by PCP 2 weeks ago. Feels better but sore throat and drainage persist.  Glands feel swollen and sore.  No fever , cp, orthopnea, edema, n/v.  No otc used.          Problem List:  HYPERTENSION (ICD-401.9) - controlled on METOPROLOL 25mg  Bid & HCT 25mg /d & K20/d...  ~  12/12:  BP=116/74  today and doing well- denies HA, visual changes, CP, palipit, dizziness, syncope, dyspnea, edema, etc... ~  6/13:  BP= 112/68 & she denies signif CP, palpit, SOB, edema, etc... ~  CXR 6/13 showed normal heart size, clear lungs, scoliosis & degen arthritis, GB clips... ~  11/13:  BP= 110/78 & she denies specific CP, palpit, SOB, edema...  CHEST PAIN, ATYPICAL (ICD-786.59) - on ASA 81mg /d... hx CWP x 25 yrs assoc w/ her FM... symptoms flair on & off, but doing OK overall & no angina. ~  9/11: developed chest "pressure" assoc w/ palpit & went to ER w/ 1d adm by TH> EKG, Enz, CXR, Labs- essent neg (K=3.2 & supplemented, had rare PVC on monitor)... referred to [facility]. ~  9/11:  seen by DrCrenshaw w/ 2DEcho showing mild MR, norm LVF;  Stress Echo 9/11 showed normal LVF & no stress induced wall motion abnormalities... ~  2/12: she had cardiac eval by DrCrenshaw w/ norm EKG, doing well overall just some palpit at night & he rec additional 25mg  Metoprolol prn.  PALPITATIONS (ICD-785.1)  <SEE ABOVE> she's noted tachypalpit & assoc w/ vivid dreams waking her from sleep... she was given METOPROLOL 25mg - 1/2 Bid in hosp 9/11 (monitor w/ PVC only) & this was incr to 25mg  Bid by DrCrenshaw... we called in KLONOPIN 0.5mg  Bid which helped the palpit as well... she was also drinking mod caffeine & taking Pseudophed> asked to stop  both... ~  CardioNet 9/11 showed NSR w/ PVC's, treated w/ BBlocker.  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 she saw DrLewitt for eval HAs & he did MRI at [facility] confirming no signif plaque & prob Fibromusc Hyperplasia... of note> she has mild HBP easily controlled on HCT & subseq RADopplers 9/11 showed normal (norm RAs, norm Ao, norm renal size etc)... she remains on ASA 81mg /d. ~  she continues to f/u w/ DrLewitt who does repeat MRI/ MRA periodically to follow. ~  RA Dopplers 9/11 were normal- no plaque & no fibromuschyperplasia...  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 ~  5-6/13:  She saw DrLewitt for f/u>  Right CBruit w/ CDoppler showing ?bilat 50-69% ICA stenoses; subseq MRA of the neck showed prom diffuse fibromusc changes & he discussed this w/ IR DrDeveshwar- he rec just observ due to the fact that she is asymptomatic w/o neuro ischemic symptoms (DrLewitt offered Vasc surg consultation at a medical center)... ~  11/13:  She is due for f/u studies per DrLewitt soon...  HYPERCHOLESTEROLEMIA (ICD-272.0) - prev on Simva20 but she  stopped on her own due to wt gain & muscle aches... ~  FLP 6/09 showed TChol 220, TG 88, HDL 67, LDL 138... rec- start Simva20, + diet/ exercise. ~  FLP 03/13/08 showed TChol 188, TG 95, HDL, 80, LDL 89... looks great- same Rx. ~  3/10 stopped Simva20 for 1 mo due to muscle symptoms- no change off med therefore restarted. ~  FLP 9/10 on Simva20 showed TChol 160. TG 101, HDL 64, LDL 76 ~  FLP 4/11 on Simva20 showed TChol 181, TG 117, HDL 73, LDL 85 ~  FLP in hosp 9/11 showed TChol 187, TG 204, HDL 63, LDL 83 ~  She subsequently stopped Simva on her own due to wt gain & muscle aches,  better off this med. ~  FLP 6/12 off Simva on diet alone showed TChol 211, TG 129, HDL 65, LDL 126... rec better diet + exercise, she doesn't want meds. 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 currently taking LEVOTHYROID Daily... ~  labs 9/10 showed TSH= 0.19 ~  labs 4/11 showed TSH= 0.28 ~  labs 8/11 by DrPatterson showed TSH= 0.10 ~  labs 9/11 in hosp showed TSH= 0.91, FreeT3= 2.3 (2.3-4.2), FreeT4= 1.07 (0.80-1.80) ~  labs 6/12 on Levo75 showed TSH= 0.38 ~  Labs 6/13 on Levothy75 showed TSH= 0.30  HIATAL HERNIA (ICD-553.3), GERD & GASTROPARESIS - Rx NEXIUM 40mg /d, DOMPERIDONE 10mg  Tid, CARAFATE Prn (which she takes for "real bad" indigestion), PANCREASE Tid, and now probiotics (ALIGN).Marland Kitchen. ~  EGD by DrPatterson was 1/07 and showed 4cm HH, esophagitis, & duodenitis...  ~  repeat EGD 09/13/07 w/ 3cmHH, gastritis, stricture- dilated... ~  treated 6/10 for gas/ bloating/ discomfort w/ Xifaxan but no better... ~  repeat EGD 9/11 by DrPatterson showed HH, otherw normal & bx was neg... ~  Gastric Emptying scan 4/12 showed 87% retention at 1H & 58% retention at 2H (norm <30% at [facility]). ~  EGD 7/13 by DrPatterson showed a large 5-6cmHH, gastitis, bx neg for HPylori, Rec- continue same.  DIVERTICULAR DISEASE (ICD-562.10) IRRITABLE BOWEL SYNDROME, HX OF (ICD-V12.79) COLONIC POLYPS (ICD-211.3) -  ~  colonoscopy was 1/06 showing divertics and hems... f/u planned 5 yrs. ~  12/09 had eval for abd pain & other symptoms- CT w/ ?sm abscess- ?diverticulits... f/u colonoscopy 1/10 by DrPatterson w/ severe pan-diverticulosis but no inflamm (resolved on Cipro/ Flagyl Rx)... ~  2/11: another eval from DrPatterson for gas/ bloating/ etc> colonoscopy showed divertics & tiny hyperpl polyp removed... ~  8/11: on going eval from DrPatterson (LUQ pain & nausea)- CT Abd showed  large & sm bowel divertics w/o inflamm, s/p GB & Hyst, & mild fatty liver dis... ~  Colonoscopy 7/13 by DrPatterson showed severe divertic throughout colon, Rec- hi fiber.  OSTEOARTHRITIS (ICD-715.90) - sees DrDaldorf... hx bilat shoulder problems (impingement & chr instability) w/ shots & PT- see his notes. CERVICAL & LUMBAR SPONDYLOSIS FIBROMYALGIA (ICD-729.1) - DrDeveshwar tried Lyrica 50mg Tid but off this now per DrLewitt due to blurry vision & 15# weight gain... she takes SKELAXIN 800mg TidPrn, CELEBREX (ave 1 tab Qod), and Tylenol...  ~  she saw DrRowe yrs ago and had Rheum re-evaluation by Rober Minion 6/09- they discussed Lyrica/ Topamax/ Flexeril therapy (also rec Align for her IBS, Urology referral for her IC, supplements for her DJD, & lab work up for her fatigue)... ~  now she  says DrLewitt is treating her FM> she goes to [facility] for massage & water exercises.  VITAMIN D DEFICIENCY (ICD-268.9) - on Vit D 50000 u weekly... ~  labs 6/09 showed Vit D level = 22... start 50K weekly... ~  labs 11/09 showed Vit D level = 51... asked to switch to OTC Vit D supplement but she continued the perscription. ~  Labs 6/12 showed Vit D level = 61... She prefers the 50K weekly Rx... ~  Labs 6/13 showed Vit D level = 52  MIGRAINE HEADACHE (ICD-346.90) - prev used Midrin, plus the above meds and is followed by DrLewitt... also saw him for dizziness and Dx w/ benign positional vertigo Rx w/ exercises... ~  DrLewitt doesn't want the use of Midrin due to vasoconstrictor properties...  ANXIETY (ICD-300.00) - on KLONOPIN 0.5mg  Bid...  ACNE ROSACEA (ICD-695.3)  Health Maintenance - GYN= DrARoss (on Premarin) & he does PAP smears, Mammograms, & BMD's on her...   Past Surgical History  Procedure Laterality Date  . Cholecystectomy  1985  . Vaginal hysterectomy  1993  . Cesarean section    . Foot osteotomy  2006    Right foot  . Tubal ligation  1983  . Bunionectomy  09/04/2011    left     Outpatient Encounter Prescriptions as of 06/14/2012  Medication Sig Dispense Refill  . AMBULATORY NON FORMULARY MEDICATION Lidocaine HCL Isotonic (45ml) 4% Nasal Spray As needed      . aspirin 81 MG tablet Take 81 mg by mouth daily.        . Carboxymethylcellul-Glycerin (OPTIVE) 0.5-0.9 % SOLN Apply to eye as needed.        . CELEBREX 200 MG capsule TAKE 1 CAPSULE BY MOUTH EVERY DAY  30 capsule  5  . chlorzoxazone (PARAFON) 500 MG tablet Take 500 mg by mouth as needed.        . clidinium-chlordiazePOXIDE (LIBRAX) 2.5-5 MG per capsule Take 1 capsule by mouth 3 (three) times daily as needed.  60 capsule  3  . clonazePAM (KLONOPIN) 0.5 MG tablet Take 1 tablet (0.5 mg total) by mouth 2 (two) times daily as needed.  60 tablet  0  . colestipol (MICRONIZED COLESTIPOL HCL) 1 G tablet Take 1 tablet (1 g total) by mouth daily.  60 tablet  2  . esomeprazole (NEXIUM) 40 MG capsule Take 1 capsule (40 mg total) by mouth 2 (two) times daily.  60 capsule  10  . estrogens, conjugated, (PREMARIN) 0.625 MG tablet Take 0.625 mg by mouth daily. Take daily for 21 days then do not take for 7 days.       . hydrochlorothiazide (HYDRODIURIL) 25 MG tablet Take 1 tablet (25 mg total) by mouth daily.  90 tablet  2  . levothyroxine (SYNTHROID, LEVOTHROID) 75 MCG tablet TAKE 1 TABLET DAILY  90 tablet  2  . lidocaine (LIDODERM) 5 % Place 1 patch onto the skin as needed. Remove & Discard patch within 12 hours or as directed by MD       . meclizine (ANTIVERT) 25 MG tablet Take 1 tablet (25 mg total) by mouth every 4 (four) hours as needed.  30 tablet  11  . metoprolol tartrate (LOPRESSOR) 25 MG tablet One tablet in the morning and 2 tablets in the evening  90 tablet  11  . mometasone (NASONEX) 50 MCG/ACT nasal spray Place 2 sprays into the nose as needed. 2 sprays in each nostril daily      . potassium  chloride SA (K-DUR,KLOR-CON) 20 MEQ tablet Take 1 tablet (20 mEq total) by mouth 2 (two) times daily.  60 tablet  11  .  traMADol (ULTRAM) 50 MG tablet       . Triamcinolone Acetonide (NASACORT AQ NA) Place into the nose.        . Vitamin D, Ergocalciferol, (DRISDOL) 50000 UNITS CAPS TAKE ONE TABLET BY MOUTH ONCE A WEEK  4 capsule  6  . [DISCONTINUED] amoxicillin-clavulanate (AUGMENTIN) 875-125 MG per tablet Take 1 tablet by mouth 2 (two) times daily.  14 tablet  0  . [DISCONTINUED] Hyoscyamine-Phenyltoloxamine 6.0454-09 MG CAPS Take 1 capsule by mouth 3 (three) times daily as needed.  48 each  0  . [DISCONTINUED] methylPREDNISolone (MEDROL, PAK,) 4 MG tablet follow package directions  21 tablet  0  . [DISCONTINUED] Vitamin D, Ergocalciferol, (DRISDOL) 50000 UNITS CAPS TAKE ONE TABLET BY MOUTH ONCE A WEEK  4 capsule  6   No facility-administered encounter medications on file as of 06/14/2012.    Allergies  Allergen Reactions  . Tetracycline Other (See Comments)    GI bleed  . Codeine Nausea And Vomiting    GI upset/vomit    Current Medications, Allergies, Past Medical History, Past Surgical History, Family History, and Social History were reviewed in [employer] record.    Review of Systems       Constitutional:   No  weight loss, night sweats,  Fevers, chills, fatigue, or  lassitude.  HEENT:   No headaches,  Difficulty swallowing,  Tooth/dental problems, or  Sore throat,                No sneezing, itching, ear ache,  ++nasal congestion, post nasal drip,   CV:  No chest pain,  Orthopnea, PND, swelling in lower extremities, anasarca, dizziness, palpitations, syncope.   GI  No heartburn, indigestion, abdominal pain, nausea, vomiting, diarrhea, change in bowel habits, loss of appetite, bloody stools.   Resp: No shortness of breath with exertion or at rest.  No excess mucus, no productive cough,  No non-productive cough,  No coughing up of blood.  No change in color of mucus.  No wheezing.  No chest wall deformity  Skin: no rash or lesions.  GU: no dysuria, change in color of  urine, no urgency or frequency.  No flank pain, no hematuria   MS:  No joint pain or swelling.  No decreased range of motion.  No back pain.  Psych:  No change in mood or affect. No depression or anxiety.  No memory loss.       Objective:   Physical Exam   WD, WN, 66 y/o BF in NAD...  Vital Signs:  Reviewed... GENERAL:  Alert & oriented; pleasant & cooperative... HEENT:  [place]/AT, EOM-full, PERRLA, EACs- bilateral cerumen impaction TMs-wnl, NOSE: clear drainage   THROAT-clear & wnl. NECK:  Supple w/ fair ROM; no JVD; sl decr carotid impulses w/ bilat L>R bruits ; no thyromegaly or nodules palpated; no lymphadenopathy. CHEST:  Clear to P & A; without wheezes/ rales/ or rhonchi heard... HEART:  Regular Rhythm; without murmurs/ rubs/ or gallops detected... ABDOMEN:  Soft & nontender; normal bowel sounds; no organomegaly or masses detected. EXT: without deformities or arthritic changes; no varicose veins/ venous insuffic/ or edema. Neuro: a/o x 3 , no focal deficits noted.   DERM:  without lesions seen x acne.    Assessment & Plan:

## 2012-06-14 NOTE — Addendum Note (Signed)
Addended by: Boone Master E on: 06/14/2012 10:46 AM   Modules accepted: Orders

## 2012-08-11 ENCOUNTER — Ambulatory Visit (INDEPENDENT_AMBULATORY_CARE_PROVIDER_SITE_OTHER): Payer: Medicare PPO | Admitting: Cardiology

## 2012-08-11 ENCOUNTER — Encounter: Payer: Self-pay | Admitting: Cardiology

## 2012-08-11 VITALS — BP 120/80 | HR 77 | Wt 166.0 lb

## 2012-08-11 DIAGNOSIS — E785 Hyperlipidemia, unspecified: Secondary | ICD-10-CM

## 2012-08-11 DIAGNOSIS — I10 Essential (primary) hypertension: Secondary | ICD-10-CM

## 2012-08-11 DIAGNOSIS — R002 Palpitations: Secondary | ICD-10-CM

## 2012-08-11 NOTE — Assessment & Plan Note (Signed)
Continue beta blocker. 

## 2012-08-11 NOTE — Assessment & Plan Note (Signed)
Management per primary care. 

## 2012-08-11 NOTE — Progress Notes (Signed)
HPI: Pleasant female for fu of palpitations. Admitted in Sept 2011 with chest pain and echocardiogram showed normal LV function. There was mild mitral regurgitation. Cardiac markers, d-dimer, liver functions were normal. The BNP less than 30. TSH normal. Abdominal ultrasound in September of 2011 was normal. Also with h/o palpitations. A stress echocardiogram was performed in September of 2011 and revealed normal LV function and no stress-induced wall motion abnormalities. A CardioNet was performed and revealed sinus rhythm with PVCs. Palpitations treated with beta blocker. Carotid Dopplers in May of 2013 showed a 50-69% stenosis in the right and left internal carotid artery. Findings felt consistent with fibromuscular dysplasia. MRA also consistent with fibromuscular dysplasia. Since I last saw her in August 2012, there is no dyspnea on exertion, orthopnea, PND or chest pain. She does occasionally have indigestion. She occasionally has palpitations upon awakening but these are reasonably well controlled.   Current Outpatient Prescriptions  Medication Sig Dispense Refill  . AMBULATORY NON FORMULARY MEDICATION Lidocaine HCL Isotonic (45ml) 4% Nasal Spray As needed      . aspirin 81 MG tablet Take 81 mg by mouth daily.        . Carboxymethylcellul-Glycerin (OPTIVE) 0.5-0.9 % SOLN Apply to eye as needed.        . CELEBREX 200 MG capsule TAKE 1 CAPSULE BY MOUTH EVERY DAY  30 capsule  5  . clidinium-chlordiazePOXIDE (LIBRAX) 2.5-5 MG per capsule Take 1 capsule by mouth 3 (three) times daily as needed.  60 capsule  3  . clonazePAM (KLONOPIN) 0.5 MG tablet Take 1 tablet (0.5 mg total) by mouth 2 (two) times daily as needed.  60 tablet  0  . colestipol (MICRONIZED COLESTIPOL HCL) 1 G tablet Take 1 tablet (1 g total) by mouth daily.  60 tablet  2  . esomeprazole (NEXIUM) 40 MG capsule Take 1 capsule (40 mg total) by mouth 2 (two) times daily.  60 capsule  10  . estrogens, conjugated, (PREMARIN) 0.625 MG  tablet Take 0.625 mg by mouth daily. Take daily for 21 days then do not take for 7 days.       . hydrochlorothiazide (HYDRODIURIL) 25 MG tablet Take 1 tablet (25 mg total) by mouth daily.  30 tablet  5  . levothyroxine (SYNTHROID, LEVOTHROID) 75 MCG tablet TAKE 1 TABLET DAILY  30 tablet  5  . lidocaine (LIDODERM) 5 % Place 1 patch onto the skin as needed. Remove & Discard patch within 12 hours or as directed by MD       . meclizine (ANTIVERT) 25 MG tablet Take 1 tablet (25 mg total) by mouth every 4 (four) hours as needed.  30 tablet  11  . metoprolol tartrate (LOPRESSOR) 25 MG tablet One tablet in the morning and 2 tablets in the evening  90 tablet  11  . mometasone (NASONEX) 50 MCG/ACT nasal spray Place 2 sprays into the nose as needed. 2 sprays in each nostril daily      . potassium chloride SA (K-DUR,KLOR-CON) 20 MEQ tablet Take 1 tablet (20 mEq total) by mouth 2 (two) times daily.  60 tablet  11  . traMADol (ULTRAM) 50 MG tablet Take 50 mg by mouth as needed.       . Triamcinolone Acetonide (NASACORT AQ NA) Place into the nose.        . Vitamin D, Ergocalciferol, (DRISDOL) 50000 UNITS CAPS TAKE ONE TABLET BY MOUTH ONCE A WEEK  4 capsule  6   No current facility-administered medications  for this visit.     Past Medical History  Diagnosis Date  . Hypertension   . Hyperlipidemia   . Hypothyroidism   . Hiatal hernia 04/25/2005  . GERD (gastroesophageal reflux disease) 09/13/2007  . Diverticulosis 05/10/2004  . IBS (irritable bowel syndrome)   . Colon polyps   . Osteoarthritis   . Fibromyalgia   . Vitamin D deficiency   . Migraine   . Anxiety   . Acne rosacea   . Bile salt-induced diarrhea   . Gastroparesis   . External hemorrhoids 05/10/2004  . Esophagitis 04/25/2005  . Duodenitis 04/25/2005  . Esophageal stricture 09/13/2007  . Sessile colonic polyp 05/30/2009  . Degenerative disc disease   . Vertigo   . 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 Other and  unspecified noninfectious gastroenteritis and colitis     Past Surgical History  Procedure Laterality Date  . Cholecystectomy  1985  . Vaginal hysterectomy  1993  . Cesarean section    . Foot osteotomy  2006    Right foot  . Tubal ligation  1983  . Bunionectomy  09/04/2011    left    History   Social History  . Marital Status: Married    Spouse Name: N/A    Number of Children: 2  . Years of Education: N/A   Occupational History  . Retired   .     Social History Main Topics  . Smoking status: Never Smoker   . Smokeless tobacco: Never Used  . Alcohol Use: No  . Drug Use: No  . Sexually Active: Not on file   Other Topics Concern  . Not on file   Social History Narrative  . No narrative on file    ROS: no fevers or chills, productive cough, hemoptysis, dysphasia, odynophagia, melena, hematochezia, dysuria, hematuria, rash, seizure activity, orthopnea, PND, pedal edema, claudication. Remaining systems are negative.  Physical Exam: Well-developed well-nourished in no acute distress.  Skin is warm and dry.  HEENT is normal.  Neck is supple.  Chest is clear to auscultation with normal expansion.  Cardiovascular exam is regular rate and rhythm.  Abdominal exam nontender or distended. No masses palpated. Extremities show no edema. neuro grossly intact  ECG sinus rhythm at a rate of 77. No ST changes.

## 2012-08-11 NOTE — Patient Instructions (Addendum)
Your physician wants you to follow-up in: ONE YEAR WITH DR CRENSHAW You will receive a reminder letter in the mail two months in advance. If you don't receive a letter, please call our office to schedule the follow-up appointment.  

## 2012-08-11 NOTE — Assessment & Plan Note (Signed)
Blood pressure controlled. Continue present medications. 

## 2012-08-13 ENCOUNTER — Telehealth: Payer: Self-pay | Admitting: Gastroenterology

## 2012-08-13 NOTE — Telephone Encounter (Signed)
Pt reports pain in her left side above her waist and the area is so tender she can't lie on her left side to sleep. She also reports belching and abdominal bloating and very load rumbling. She takes Librax BID, but informed her it can be taken TID. Last CT in 2012 sowed diverticulosis in small and large bowel. Last ECL 10/2011, HH and diverticulosis. Pt given an appt on 08/24/12 with Dr Jarold Motto.

## 2012-08-13 NOTE — Telephone Encounter (Signed)
The patient is having abdominal discomfort again and would like to be seen.

## 2012-08-13 NOTE — Telephone Encounter (Signed)
The patient

## 2012-08-24 ENCOUNTER — Encounter: Payer: Self-pay | Admitting: Gastroenterology

## 2012-08-24 ENCOUNTER — Ambulatory Visit (INDEPENDENT_AMBULATORY_CARE_PROVIDER_SITE_OTHER): Payer: Medicare PPO | Admitting: Gastroenterology

## 2012-08-24 VITALS — BP 114/72 | HR 80 | Ht 61.5 in | Wt 166.0 lb

## 2012-08-24 DIAGNOSIS — R197 Diarrhea, unspecified: Secondary | ICD-10-CM

## 2012-08-24 DIAGNOSIS — K589 Irritable bowel syndrome without diarrhea: Secondary | ICD-10-CM

## 2012-08-24 DIAGNOSIS — K9089 Other intestinal malabsorption: Secondary | ICD-10-CM

## 2012-08-24 DIAGNOSIS — K219 Gastro-esophageal reflux disease without esophagitis: Secondary | ICD-10-CM

## 2012-08-24 MED ORDER — COLESTIPOL HCL 1 G PO TABS: 1.0000 g | ORAL_TABLET | Freq: Two times a day (BID) | ORAL | Status: DC

## 2012-08-24 MED ORDER — ESOMEPRAZOLE MAGNESIUM 40 MG PO CPDR: 40.0000 mg | DELAYED_RELEASE_CAPSULE | Freq: Two times a day (BID) | ORAL | Status: DC

## 2012-08-24 NOTE — Progress Notes (Signed)
This is a very complicated 67 year old African American female with multiple medical problems and numerous functional GI complaints.  She also has chronic IBS, chronic GERD, alternating diarrhea and constipation with abdominal gas and bloating, currently on Librax 3 times a day, and a variety of other multiple medications per primary care.  She is up-to-date on her endoscopy and colonoscopy within the last year, and there is been no evidence of H. Gloria infection, celiac disease, or IBD.  She is status post cholecystectomy and takes Colestid 1 g a day for chronic diarrhea.  Her acid reflux is required Nexium 40 mg twice a day, and she continues with vague discomfort in her subxiphoid area but no dysphagia or hepatobiliary complaints, anorexia or weight loss.  Current Medications, Allergies, Past Medical History, Past Surgical History, Family History and Social History were reviewed in [employer] record.  ROS: All systems were reviewed and are negative unless otherwise stated in the HPI.          Physical Exam: Healthy patient in no distress.  Blood pressure 114/72, pulse 80 and regular, and weight 166 with a BMI of 30.86.  There are no stigmata of chronic liver disease noted.  Chest is clear and she is in a regular rhythm without murmurs gallops or rubs.  There is no abdominal distention, organomegaly, masses or tenderness.  Bowel sounds are normal.  Mental status is normal, and peripheral extremities are unremarkable.    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  I've increased her Colestid 1 g twice a day, have added a trial of probiotic therapy, we'll continue PPI therapy for acid reflux, and have asked her to avoid nonabsorbable carbohydrates in her diet.  I do not think we need repeat GI evaluations at this time.  The patient also is on nasal B12 replacement therapy.  Her Librax seems to manage her IBS symptoms of gas and bloating, but these do  continue to a degree, and hopefully will be helped by probiotic trial.  Stool for C. difficile toxin and elastase-i assay ordered. Encounter Diagnosis  Name Primary?  . Diarrhea Yes

## 2012-08-24 NOTE — Patient Instructions (Addendum)
  Please increase Nexium to twice daily. Please increase Colestipol to twice daily. New prescriptions were sent.  Please purchase Align over the counter, this puts good bacteria back into your colon. You should take 1 capsule by mouth once daily.   Your physician has requested that you go to the basement for lab work before leaving today.  ___________________________________________________________________________________________________________________________                                               We are excited to introduce MyChart, a new best-in-class service that provides you online access to important information in your electronic medical record. We want to make it easier for you to view your health information - all in one secure location - when and where you need it. We expect MyChart will enhance the quality of care and service we provide.  When you register for MyChart, you can:    View your test results.    Request appointments and receive appointment reminders via email.    Request medication renewals.    View your medical history, allergies, medications and immunizations.    Communicate with your physician's office through a password-protected site.    Conveniently print information such as your medication lists.  To find out if MyChart is right for you, please talk to a member of our clinical staff today. We will gladly answer your questions about this free health and wellness tool.  If you are age 23 or older and want a member of your family to have access to your record, you must provide written consent by completing a proxy form available at our office. Please speak to our clinical staff about guidelines regarding accounts for patients younger than age 50.  As you activate your MyChart account and need any technical assistance, please call the MyChart technical support line at [phone]) or email your question to [email or web address].  If you email your question(s), please include your name, a return phone number and the best time to reach you.  If you have non-urgent health-related questions, you can send a message to our office through MyChart at [email or web address]. If you have a medical emergency, call 911.  Thank you for using MyChart as your new health and wellness resource!   MyChart licensed from [employer],  [id number]. Patents Pending.

## 2012-08-27 ENCOUNTER — Other Ambulatory Visit: Payer: Medicare PPO

## 2012-08-27 DIAGNOSIS — R197 Diarrhea, unspecified: Secondary | ICD-10-CM

## 2012-08-30 LAB — CLOSTRIDIUM DIFFICILE BY PCR: Toxigenic C. Difficile by PCR: NOT DETECTED

## 2012-09-01 ENCOUNTER — Other Ambulatory Visit: Payer: Self-pay | Admitting: Gastroenterology

## 2012-09-01 NOTE — Telephone Encounter (Signed)
Called pharmacy to see if they had prescription because it came up in medication refill as a canceled Rx. Per Christiane Ha at [facility] prescription is there and they are filling it now.

## 2012-09-07 ENCOUNTER — Ambulatory Visit: Payer: Medicare Other | Admitting: Pulmonary Disease

## 2012-09-14 ENCOUNTER — Telehealth: Payer: Self-pay | Admitting: Pulmonary Disease

## 2012-09-14 ENCOUNTER — Encounter (HOSPITAL_COMMUNITY): Payer: Self-pay | Admitting: Emergency Medicine

## 2012-09-14 ENCOUNTER — Emergency Department (HOSPITAL_COMMUNITY)
Admission: EM | Admit: 2012-09-14 | Discharge: 2012-09-14 | Disposition: A | Discharge status: Home / Self Care | Payer: Medicare PPO | Source: Home / Self Care | Attending: Family Medicine | Admitting: Family Medicine

## 2012-09-14 DIAGNOSIS — Z23 Encounter for immunization: Secondary | ICD-10-CM

## 2012-09-14 DIAGNOSIS — T310 Burns involving less than 10% of body surface: Secondary | ICD-10-CM

## 2012-09-14 MED ORDER — TETANUS-DIPHTH-ACELL PERTUSSIS 5-2.5-18.5 LF-MCG/0.5 IM SUSP
0.5000 mL | Freq: Once | INTRAMUSCULAR | Status: AC
  Administered 2012-09-14: 0.5 mL via INTRAMUSCULAR

## 2012-09-14 MED ORDER — TETANUS-DIPHTH-ACELL PERTUSSIS 5-2.5-18.5 LF-MCG/0.5 IM SUSP
INTRAMUSCULAR | Status: AC
  Filled 2012-09-14: qty 0.5
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
## 2012-09-17 ENCOUNTER — Telehealth: Payer: Self-pay | Admitting: Pulmonary Disease

## 2012-09-17 DIAGNOSIS — L719 Rosacea, unspecified: Secondary | ICD-10-CM
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
## 2012-09-20 ENCOUNTER — Other Ambulatory Visit: Payer: Self-pay | Admitting: Cardiology

## 2012-09-20 ENCOUNTER — Other Ambulatory Visit: Payer: Self-pay | Admitting: Gastroenterology

## 2012-10-01 ENCOUNTER — Other Ambulatory Visit: Payer: Self-pay | Admitting: Pulmonary Disease

## 2012-10-01 ENCOUNTER — Other Ambulatory Visit: Payer: Self-pay | Admitting: Gastroenterology

## 2012-10-13 ENCOUNTER — Ambulatory Visit (INDEPENDENT_AMBULATORY_CARE_PROVIDER_SITE_OTHER): Payer: Medicare PPO | Admitting: Pulmonary Disease

## 2012-10-13 ENCOUNTER — Other Ambulatory Visit: Payer: Self-pay | Admitting: Pulmonary Disease

## 2012-10-13 ENCOUNTER — Encounter: Payer: Self-pay | Admitting: Pulmonary Disease

## 2012-10-13 VITALS — BP 110/72 | HR 72 | Temp 98.5°F | Ht 61.0 in | Wt 168.0 lb

## 2012-10-13 DIAGNOSIS — IMO0001 Reserved for inherently not codable concepts without codable children: Secondary | ICD-10-CM

## 2012-10-13 DIAGNOSIS — K219 Gastro-esophageal reflux disease without esophagitis: Secondary | ICD-10-CM

## 2012-10-13 DIAGNOSIS — I10 Essential (primary) hypertension: Secondary | ICD-10-CM

## 2012-10-13 DIAGNOSIS — I7789 Other specified disorders of arteries and arterioles: Secondary | ICD-10-CM

## 2012-10-13 DIAGNOSIS — E785 Hyperlipidemia, unspecified: Secondary | ICD-10-CM

## 2012-10-13 DIAGNOSIS — F411 Generalized anxiety disorder: Secondary | ICD-10-CM

## 2012-10-13 DIAGNOSIS — M1990 Unspecified osteoarthritis, unspecified site: Secondary | ICD-10-CM

## 2012-10-13 DIAGNOSIS — K3184 Gastroparesis: Secondary | ICD-10-CM

## 2012-10-13 DIAGNOSIS — I773 Arterial fibromuscular dysplasia: Secondary | ICD-10-CM

## 2012-10-13 DIAGNOSIS — R002 Palpitations: Secondary | ICD-10-CM

## 2012-10-13 DIAGNOSIS — K589 Irritable bowel syndrome without diarrhea: Secondary | ICD-10-CM

## 2012-10-13 DIAGNOSIS — K5730 Diverticulosis of large intestine without perforation or abscess without bleeding: Secondary | ICD-10-CM

## 2012-10-13 NOTE — Patient Instructions (Addendum)
Today we updated your med list in our EPIC system...    Continue your current medications the same...  Please return to our lab one morning soon for your FASTING blood work...    We will contact you w/ the results when available...   Keep up the good job w/ diet 7 exercise!  Call for any questions...  Let's plan a follow up visit in 34mo, sooner if needed for problems.Marland KitchenMarland Kitchen

## 2012-10-13 NOTE — Progress Notes (Addendum)
Subjective:    Patient ID: Angela [name], female    DOB: 1945/12/05, 67 y.o.   MRN: [id number]  HPI 67 y/o BF here for a follow up visit... she has mult medical problems as listed below...   ~  March 19, 2011:  67mo ROV & her CC= cramping in her hands for which she received an injection by Ortho;  She notes "same problems" but feels they are sl better overall;  She had Flu vaccine 11/12, meds same, BMet today is wnl...    She saw DrCrenshaw 8/12> Hx AtypCP & palpit w/ hx rare PVCs on Holter, otherw w/u was entirely neg; notes incr palpit w/ stress- rec continue Metoprolol & take extra 25mg  prn...    She had f/u DrPatterson 11/12> GI motility disorder w/ gas/ bloating, chr IBS & intol to the Domperidone 10mg  w/ paresthesias; s/p GB on Colestid 2gmBid; also takes Nexium 40mg /d & Lactaid Bid w/ meals; overall stable, continue same...    She saw DrDaldorg 10/12> lft 3rd MCP synovitis injected; bilat shoulder impingements w/ prev injection therapy; Cx & Lumbar spondylosis; foot surg in past; FM w/ trigger points on Lidoderm, Celebrex...  ~  September 24, 2011:  67mo ROV & Angela [name] is c/o soreness in her ribcage & chest wall w/ a flair in her FM> on Celebrex, Vicodin, Tramadol (from Ortho for pinched nerve), Lidoderm, Parafon... She had a sinus infection treated 2/13 by TP w/ ZPak & improved...     She had GI f/u DrPatterson>  HxIBS w/ alt diarrhea/ constip; Currently on Xifaxan, Nexium, & he started "Digest" antispasmotic capsBid    She saw DrLewitt for f/u 5/13>  Right CBruit w/ CDoppler showing ?bilat 50-69% ICA stenoses; subseq MRA of the neck showed prom diffuse fibromusc changes & he discussed this w/ IR DrDeveshwar- he rec just observ due to the fact that she is asymptomatic w/o neuro ischemic symptoms (DrLewitt offered Vasc surg consultation at a medical center); Note- he did not want to use Midrin for HAs due to it's vasoconstrictor properties...     We reviewed prob list, meds, xrays and labs> see  below>> she is rec to take her Klonopin 1/2 to 1 tab Bid regularly... CXR 6/13 showed normal heart size, clear lungs, scoliosis & degen arthritis, GB clips LABS 6/13:  FLP- at goals on Colestipol;  Chems- ok x K=3.4 (K incr to 20Bid);  CBC- ok;  TSH= 0.30 on Levothy75;  VitD=52  ~  March 10, 2012:  67mo ROV & Angela [name] continues to see mult specialists for her medical problems...    Dizziness, AtypCP, Palpit> on Meclizine25, ASA81, Klonopin0.5; notes 1epis dizziness, drainage, congestion- refilled Mecliz, Mucinex, Saline...    HBP> on Metop25Bid, Hct25, K20; BP=110/78 7 denies CP, palpit, SOB, edema, etc...    CBruit, Fibromusc Hyperplasia> followed by DrLewitt & TDeveshwar on observ- f/u Q82mo & he has been doing MRA Neck & CDuplex Q22mo- see below...    CHOL> on diet & Colestipol; FLP 6/13 showed TChol 180, TG 121, HDL 63, LDL 93    Hypothy> on Levo75; TSH 6/13 = 0.30 & she denies over or under symptoms...    HH, GERD, Gastroparesis> on Nexium40Bid; c/o epig pain- she had EGD 7/13 by DrPatterson- large 5-6cmHH, gastitis, bx neg for HPylori...    Divertics, IBS-D, Polyps> on Librax prn; she had colonoscopy 7/13 DrPatterson- severe divertic throughout colon, Rec- hi fiber...    S/P GB w/ bile salt diarrhea> on Colestid- supposed to take 2gm regularly.Marland KitchenMarland Kitchen  DJD, FM> on Celebrex, Tramadol, Parafon; she has mult somatic complaints...    VitD defic> on VitD 50K wkly; Vit D level 6/13 = 52...    Anxiety> on Klonopin0.5Bid prn; encouraged to take more regularly... We reviewed prob list, meds, xrays and labs> see below for updates >>   ~  October 13, 2012:  58mo ROV & [name] reports that recent check by DrLewitt for her carotid fibromusc hyperplasia showed some worsening 7 he is referring her to [facility] for their opinion re: treatment; We reviewed the following medical problems during today's office visit >>     Dizziness, AtypCP, Palpit> on Meclizine25, ASA81, Klonopin0.5; notes intermit dizziness, drainage,  congestion- refilled Mecliz, Mucinex, Saline...    HBP> on Metop25Bid, Hct25, K20; BP=110/72 & denies CP, palpit, SOB, edema, etc; she had f/u drCrenshaw 3/14- stable, no changes made...    CBruit, Fibromusc Hyperplasia> followed by DrLewitt & TDeveshwar on observ- f/u Q53mo & he has been doing MRA Neck & CDuplex Q68mo- seen recently & she indicates that he is referring her to [facility]...    CHOL> on diet & Colestipol; FLP 7/14 showed TChol 189, TG 123, HDL 65, LDL 99    Hypothy> on Levo75; TSH 7/14 = 0.73 & she denies over or under symptoms...    HH, GERD, Gastroparesis> on Nexium40Bid; c/o epig pain- she had EGD 7/13 by DrPatterson- large 5-6cmHH, gastitis, bx neg for HPylori...    Divertics, IBS-D, Polyps> on Probiotic + Librax prn; she had colonoscopy 7/13 DrPatterson- severe divertic throughout colon, Rec- hi fiber...    S/P GB w/ bile salt diarrhea> on Colestid- supposed to take 2gm regularly...    DJD, FM> on Celebrex, Tramadol, Lidoderm; she has mult somatic complaints...    VitD defic> on VitD 50K wkly; Vit D level 6/13 = 52...    Anxiety> on Klonopin0.5Bid prn; encouraged to take more regularly... We reviewed prob list, meds, xrays and labs> see below for updates >>  LABS 7/14:   FLP- at goals on diet Rx;  Chems- wnl;  CBC- wnl;  TSH=0.73           Problem List:  HYPERTENSION (ICD-401.9) - controlled on METOPROLOL 25mg  Bid & HCT 25mg /d & K20/d...  ~  12/12:  BP=116/74  today and doing well- denies HA, visual changes, CP, palipit, dizziness, syncope, dyspnea, edema, etc... ~  6/13:  BP= 112/68 & she denies signif CP, palpit, SOB, edema, etc... ~  CXR 6/13 showed normal heart size, clear lungs, scoliosis & degen arthritis, GB clips... ~  11/13:  BP= 110/78 & she denies specific CP, palpit, SOB, edema... ~  7/14: on Metop25Bid, Hct25, K20; BP=110/72 & denies CP, palpit, SOB, edema, etc...  CHEST PAIN, ATYPICAL (ICD-786.59) - on ASA 81mg /d... hx CWP x 25 yrs assoc w/ her FM... symptoms flair  on & off, but doing OK overall & no angina. ~  9/11: developed chest "pressure" assoc w/ palpit & went to ER w/ 1d adm by TH> EKG, Enz, CXR, Labs- essent neg (K=3.2 & supplemented, had rare PVC on monitor)... referred to [facility]. ~  9/11:  seen by DrCrenshaw w/ 2DEcho showing mild MR, norm LVF;  Stress Echo 9/11 showed normal LVF & no stress induced wall motion abnormalities... ~  2/12: she had cardiac eval by DrCrenshaw w/ norm EKG, doing well overall just some palpit at night & he rec additional 25mg  Metoprolol prn.  PALPITATIONS (ICD-785.1)  <SEE ABOVE> she's noted tachypalpit & assoc w/ vivid dreams waking  her from sleep... she was given METOPROLOL 25mg - 1/2 Bid in hosp 9/11 (monitor w/ PVC only) & this was incr to 25mg  Bid by DrCrenshaw... we called in KLONOPIN 0.5mg  Bid which helped the palpit as well... she was also drinking mod caffeine & taking Pseudophed> asked to stop both... ~  CardioNet 9/11 showed NSR w/ PVC's, treated w/ BBlocker.  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 she saw DrLewitt for eval HAs & he did MRI at [facility] confirming no signif plaque & prob Fibromusc Hyperplasia... of note> she has mild HBP easily controlled on HCT & subseq RADopplers 9/11 showed normal (norm RAs, norm Ao, norm renal size etc)... she remains on ASA 81mg /d. ~  she continues to f/u w/ DrLewitt who does repeat MRI/ MRA periodically to follow. ~  RA Dopplers 9/11 were normal- no plaque & no fibromuschyperplasia...  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 ~  5-6/13:  She saw DrLewitt for f/u>  Right CBruit w/ CDoppler showing ?bilat 50-69% ICA stenoses; subseq MRA of the neck showed prom diffuse fibromusc changes & he  discussed this w/ IR DrDeveshwar- he rec just observ due to the fact that she is asymptomatic w/o neuro ischemic symptoms (DrLewitt offered Vasc surg consultation at a medical center)... ~  MRA Neck 1/14 showed stable appearance of fibromusc dysplasia w/ RCA stenosis (unchanged)... ~  6/14: f/u eval by DrLewitt> pt reports that he is sending her to [facility] for their opinion regarding treatment...  HYPERCHOLESTEROLEMIA (ICD-272.0) - prev on Simva20 but she stopped on her own due to wt gain & muscle aches... ~  FLP 6/09 showed TChol 220, TG 88, HDL 67, LDL 138... rec- start Simva20, + diet/ exercise. ~  FLP 03/13/08 showed TChol 188, TG 95, HDL, 80, LDL 89... looks great- same Rx. ~  3/10 stopped Simva20 for 1 mo due to muscle symptoms- no change off med therefore restarted. ~  FLP 9/10 on Simva20 showed TChol 160. TG 101, HDL 64, LDL 76 ~  FLP 4/11 on Simva20 showed TChol 181, TG 117, HDL 73, LDL 85 ~  FLP in hosp 9/11 showed TChol 187, TG 204, HDL 63, LDL 83 ~  She subsequently stopped Simva on her own due to wt gain & muscle aches, better off this med. ~  FLP 6/12 off Simva on diet alone showed TChol 211, TG 129, HDL 65, LDL 126... rec better diet + exercise, she doesn't want meds. 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 currently taking LEVOTHYROID Daily... ~  labs 9/10 showed TSH= 0.19 ~  labs 4/11 showed TSH= 0.28 ~  labs 8/11 by DrPatterson showed TSH= 0.10 ~  labs 9/11 in hosp showed TSH= 0.91, FreeT3= 2.3 (2.3-4.2), FreeT4= 1.07 (0.80-1.80) ~  labs 6/12 on Levo75 showed TSH= 0.38 ~  Labs 6/13 on Levothy75 showed TSH= 0.30 ~  Labs 7/14 on Levothy75 showed TSH= 0.73  HIATAL HERNIA (ICD-553.3), GERD & GASTROPARESIS - Rx NEXIUM 40mg /d, DOMPERIDONE 10mg  Tid, CARAFATE Prn (which she  takes for "real bad" indigestion), PANCREASE Tid, and now probiotics (ALIGN).Marland Kitchen. ~  EGD by DrPatterson was 1/07 and showed 4cm HH, esophagitis, & duodenitis...  ~  repeat EGD 09/13/07 w/ 3cmHH, gastritis, stricture- dilated... ~  treated 6/10 for gas/ bloating/ discomfort w/ Xifaxan but no better... ~  repeat EGD 9/11 by DrPatterson showed HH, otherw normal & bx was neg... ~  Gastric Emptying scan 4/12 showed 87% retention at 1H & 58% retention at 2H (norm <30% at [facility]). ~  EGD 7/13 by DrPatterson showed a large 5-6cmHH, gastitis, bx neg for HPylori, Rec- continue same.  DIVERTICULAR DISEASE (ICD-562.10) IRRITABLE BOWEL SYNDROME, HX OF (ICD-V12.79) COLONIC POLYPS (ICD-211.3) -  ~  colonoscopy was 1/06 showing divertics and hems... f/u planned 5 yrs. ~  12/09 had eval for abd pain & other symptoms- CT w/ ?sm abscess- ?diverticulits... f/u colonoscopy 1/10 by DrPatterson w/ severe pan-diverticulosis but no inflamm (resolved on Cipro/ Flagyl Rx)... ~  2/11: another eval from DrPatterson for gas/ bloating/ etc> colonoscopy showed divertics & tiny hyperpl polyp removed... ~  8/11: on going eval from DrPatterson (LUQ pain & nausea)- CT Abd showed large & sm bowel divertics w/o inflamm, s/p GB & Hyst, & mild fatty liver dis... ~  Colonoscopy 7/13 by DrPatterson showed severe divertic throughout colon, Rec- hi fiber.  OSTEOARTHRITIS (ICD-715.90) - sees DrDaldorf... hx bilat shoulder problems (impingement & chr instability) w/ shots & PT- see his notes. CERVICAL & LUMBAR SPONDYLOSIS FIBROMYALGIA (ICD-729.1) - DrDeveshwar tried Lyrica 50mg Tid but off this now per DrLewitt due to blurry vision & 15# weight gain... she takes SKELAXIN 800mg TidPrn, CELEBREX (ave 1 tab Qod), and Tylenol...  ~  she saw DrRowe yrs ago and had Rheum re-evaluation by Rober Minion 6/09- they discussed Lyrica/ Topamax/ Flexeril therapy (also rec Align for her IBS, Urology referral for her IC, supplements for her DJD, & lab work up for her  fatigue)... ~  now she says DrLewitt is treating her FM> she goes to [facility] for massage & water exercises.  VITAMIN D DEFICIENCY (ICD-268.9) - on Vit D 50000 u weekly... ~  labs 6/09 showed Vit D level = 22... start 50K weekly... ~  labs 11/09 showed Vit D level = 51... asked to switch to OTC Vit D supplement but she continued the perscription. ~  Labs 6/12 showed Vit D level = 61... She prefers the 50K weekly Rx... ~  Labs 6/13 showed Vit D level = 52  MIGRAINE HEADACHE (ICD-346.90) - prev used Midrin, plus the above meds and is followed by DrLewitt... also saw him for dizziness and Dx w/ benign positional vertigo Rx w/ exercises... ~  DrLewitt doesn't want the use of Midrin due to vasoconstrictor properties...  ANXIETY (ICD-300.00) - on KLONOPIN 0.5mg  Bid...  ACNE ROSACEA (ICD-695.3)  Health Maintenance - GYN= DrARoss (on Premarin) & he does PAP smears, Mammograms, & BMD's on her...   Past Surgical History  Procedure Laterality Date  . Cholecystectomy  1985  . Vaginal hysterectomy  1993  . Cesarean section    . Foot osteotomy  2006    Right foot  . Tubal ligation  1983  . Bunionectomy  09/04/2011    left    Outpatient Encounter Prescriptions as of 10/13/2012  Medication Sig Dispense Refill  . AMBULATORY NON FORMULARY MEDICATION Lidocaine HCL Isotonic (45ml) 4% Nasal Spray As needed      . aspirin 81 MG tablet Take 81 mg by mouth daily.        Marland Kitchen  Carboxymethylcellul-Glycerin (OPTIVE) 0.5-0.9 % SOLN Apply to eye as needed.        . CELEBREX 200 MG capsule TAKE 1 CAPSULE BY MOUTH EVERY DAY  30 capsule  5  . clidinium-chlordiazePOXIDE (LIBRAX) 2.5-5 MG per capsule Take 1 capsule by mouth 3 (three) times daily as needed.  60 capsule  3  . clonazePAM (KLONOPIN) 0.5 MG tablet Take 1 tablet (0.5 mg total) by mouth 2 (two) times daily as needed.  60 tablet  0  . colestipol (MICRONIZED COLESTIPOL HCL) 1 G tablet Take 1 tablet (1 g total) by mouth 2 (two) times daily.  60  tablet  4  . esomeprazole (NEXIUM) 40 MG capsule Take 1 capsule (40 mg total) by mouth 2 (two) times daily.  60 capsule  10  . estrogens, conjugated, (PREMARIN) 0.625 MG tablet Take 0.625 mg by mouth daily. Take daily for 21 days then do not take for 7 days.       . hydrochlorothiazide (HYDRODIURIL) 25 MG tablet Take 1 tablet (25 mg total) by mouth daily.  30 tablet  5  . KLOR-CON M20 20 MEQ tablet TAKE 1 TABLET (20 MEQ TOTAL) BY MOUTH 2 (TWO) TIMES DAILY.  60 tablet  3  . latanoprost (XALATAN) 0.005 % ophthalmic solution Place 1 drop into both eyes at bedtime.      Marland Kitchen levothyroxine (SYNTHROID, LEVOTHROID) 75 MCG tablet TAKE 1 TABLET DAILY  30 tablet  5  . lidocaine (LIDODERM) 5 % Place 1 patch onto the skin as needed. Remove & Discard patch within 12 hours or as directed by MD       . meclizine (ANTIVERT) 25 MG tablet Take 1 tablet (25 mg total) by mouth every 4 (four) hours as needed.  30 tablet  11  . metoprolol tartrate (LOPRESSOR) 25 MG tablet TAKE ONE TABLET IN THE MORNING AND 2 TABLETS IN THE EVENING  90 tablet  11  . mometasone (NASONEX) 50 MCG/ACT nasal spray Place 2 sprays into the nose as needed. 2 sprays in each nostril daily      . potassium chloride SA (K-DUR,KLOR-CON) 20 MEQ tablet TAKE 1 TABLET (20 MEQ TOTAL) BY MOUTH 2 (TWO) TIMES DAILY.  60 tablet  8  . traMADol (ULTRAM) 50 MG tablet Take 50 mg by mouth as needed.       . Triamcinolone Acetonide (NASACORT AQ NA) Place into the nose.        . Vitamin D, Ergocalciferol, (DRISDOL) 50000 UNITS CAPS TAKE ONE TABLET BY MOUTH ONCE A WEEK  4 capsule  6  . [DISCONTINUED] MICRONIZED COLESTIPOL HCL 1 G tablet TAKE 1 TABLET (1 G TOTAL) BY MOUTH DAILY.  60 tablet  2  . [DISCONTINUED] MICRONIZED COLESTIPOL HCL 1 G tablet TAKE 1 TABLET (1 G TOTAL) BY MOUTH DAILY.  60 tablet  2   No facility-administered encounter medications on file as of 10/13/2012.    Allergies  Allergen Reactions  . Tetracycline Other (See Comments)    GI bleed  . Codeine  Nausea And Vomiting    GI upset/vomit    Current Medications, Allergies, Past Medical History, Past Surgical History, Family History, and Social History were reviewed in [employer] record.    Review of Systems        See HPI - all other systems neg except as noted... The patient complains of decreased hearing, chest pain, dyspnea on exertion, headaches, abdominal pain, severe indigestion/heartburn, muscle weakness, and difficulty walking.  The patient denies  anorexia, fever, weight loss, weight gain, vision loss, hoarseness, syncope, peripheral edema, prolonged cough, hemoptysis, melena, hematochezia, hematuria, incontinence, suspicious skin lesions, transient blindness, depression, unusual weight change, abnormal bleeding, enlarged lymph nodes, and angioedema.   Objective:   Physical Exam   WD, WN, 67 y/o BF in NAD...  Vital Signs:  Reviewed... GENERAL:  Alert & oriented; pleasant & cooperative... HEENT:  [place]/AT, EOM-full, PERRLA, EACs-clear, TMs-wnl, NOSE- rosacea rash,  THROAT-clear & wnl. NECK:  Supple w/ fair ROM; no JVD; sl decr carotid impulses w/ bilat L>R bruits ; no thyromegaly or nodules palpated; no lymphadenopathy. CHEST:  Clear to P & A; without wheezes/ rales/ or rhonchi heard... +trigger points. HEART:  Regular Rhythm; without murmurs/ rubs/ or gallops detected... ABDOMEN:  Soft & nontender; normal bowel sounds; no organomegaly or masses detected. EXT: without deformities or arthritic changes; no varicose veins/ venous insuffic/ or edema. NEURO:  no focal neuro deficits... DERM:  without lesions seen x acne.  RADIOLOGY DATA:  Reviewed in the EPIC EMR & discussed w/ the patient...  LABORATORY DATA:  Reviewed in the EPIC EMR & discussed w/ the patient...   Assessment & Plan:    HBP>  Controlled on meds, continue same...  ChestPain & Palpit>  Per DrCrenshaw & she can take extra Metoprolol 25mg  Prn for palpit...  Carotid Bruits & FIBROMUSC  DYSPLASIA>  Followed by DrLewitt w/ MRA 1/14 & recent recheck=> pt reports they are sending her to [facility] for their review...  CHOL>  She stopped her Statin due to wt gain & musc aches, FLP 6/13 within parameters on Colestid...  HYPOTHY>  Stable on Levothy68mcg/d...  GI> HH/ GERD/ Gastroparesis/ Divertics, IBS/ Polyps> managed by DrPatterson & EGD 7/13 as above, continue Nexium; Colonoscopy as above- rec hi fiber...  RHEUM> DJD/ FM/ etc> followed by DrDeveshwar & Daldorf as noted above...  ANXIETY>  On Klonopin 0.5mg  Bid...   Patient's Medications  New Prescriptions   No medications on file  Previous Medications   AMBULATORY NON FORMULARY MEDICATION    Lidocaine HCL Isotonic (45ml) 4% Nasal Spray As needed   ASPIRIN 81 MG TABLET    Take 81 mg by mouth daily.     CARBOXYMETHYLCELLUL-GLYCERIN (OPTIVE) 0.5-0.9 % SOLN    Apply to eye as needed.     CELEBREX 200 MG CAPSULE    TAKE 1 CAPSULE BY MOUTH EVERY DAY   CLIDINIUM-CHLORDIAZEPOXIDE (LIBRAX) 2.5-5 MG PER CAPSULE    Take 1 capsule by mouth 3 (three) times daily as needed.   CLONAZEPAM (KLONOPIN) 0.5 MG TABLET    Take 1 tablet (0.5 mg total) by mouth 2 (two) times daily as needed.   COLESTIPOL (MICRONIZED COLESTIPOL HCL) 1 G TABLET    Take 1 tablet (1 g total) by mouth 2 (two) times daily.   ESOMEPRAZOLE (NEXIUM) 40 MG CAPSULE    Take 1 capsule (40 mg total) by mouth 2 (two) times daily.   ESTROGENS, CONJUGATED, (PREMARIN) 0.625 MG TABLET    Take 0.625 mg by mouth daily. Take daily for 21 days then do not take for 7 days.    HYDROCHLOROTHIAZIDE (HYDRODIURIL) 25 MG TABLET    Take 1 tablet (25 mg total) by mouth daily.   KLOR-CON M20 20 MEQ TABLET    TAKE 1 TABLET (20 MEQ TOTAL) BY MOUTH 2 (TWO) TIMES DAILY.   LATANOPROST (XALATAN) 0.005 % OPHTHALMIC SOLUTION    Place 1 drop into both eyes at bedtime.   LEVOTHYROXINE (SYNTHROID, LEVOTHROID) 75 MCG TABLET  TAKE 1 TABLET DAILY   LIDOCAINE (LIDODERM) 5 %    Place 1 patch onto the skin as  needed. Remove & Discard patch within 12 hours or as directed by MD    MECLIZINE (ANTIVERT) 25 MG TABLET    Take 1 tablet (25 mg total) by mouth every 4 (four) hours as needed.   METOPROLOL TARTRATE (LOPRESSOR) 25 MG TABLET    TAKE ONE TABLET IN THE MORNING AND 2 TABLETS IN THE EVENING   MOMETASONE (NASONEX) 50 MCG/ACT NASAL SPRAY    Place 2 sprays into the nose as needed. 2 sprays in each nostril daily   POTASSIUM CHLORIDE SA (K-DUR,KLOR-CON) 20 MEQ TABLET    TAKE 1 TABLET (20 MEQ TOTAL) BY MOUTH 2 (TWO) TIMES DAILY.   TRAMADOL (ULTRAM) 50 MG TABLET    Take 50 mg by mouth as needed.    TRIAMCINOLONE ACETONIDE (NASACORT AQ NA)    Place into the nose.     VITAMIN D, ERGOCALCIFEROL, (DRISDOL) 50000 UNITS CAPS    TAKE ONE TABLET BY MOUTH ONCE A WEEK  Modified Medications   Modified Medication Previous Medication   POTASSIUM CHLORIDE SA (K-DUR,KLOR-CON) 20 MEQ TABLET potassium chloride SA (K-DUR,KLOR-CON) 20 MEQ tablet      TAKE 1 TABLET (20 MEQ TOTAL) BY MOUTH 2 (TWO) TIMES DAILY.    Take 1 tablet (20 mEq total) by mouth 2 (two) times daily.  Discontinued Medications   MICRONIZED COLESTIPOL HCL 1 G TABLET    TAKE 1 TABLET (1 G TOTAL) BY MOUTH DAILY.   MICRONIZED COLESTIPOL HCL 1 G TABLET    TAKE 1 TABLET (1 G TOTAL) BY MOUTH DAILY.

## 2012-10-18 DIAGNOSIS — I773 Arterial fibromuscular dysplasia: Secondary | ICD-10-CM | POA: Insufficient documentation

## 2012-10-18 DIAGNOSIS — I679 Cerebrovascular disease, unspecified: Secondary | ICD-10-CM | POA: Insufficient documentation

## 2012-10-20 ENCOUNTER — Other Ambulatory Visit: Payer: Self-pay | Admitting: Pulmonary Disease

## 2012-10-22 ENCOUNTER — Other Ambulatory Visit (INDEPENDENT_AMBULATORY_CARE_PROVIDER_SITE_OTHER): Payer: Medicare PPO

## 2012-10-22 DIAGNOSIS — F411 Generalized anxiety disorder: Secondary | ICD-10-CM

## 2012-10-22 DIAGNOSIS — K5730 Diverticulosis of large intestine without perforation or abscess without bleeding: Secondary | ICD-10-CM

## 2012-10-22 DIAGNOSIS — E785 Hyperlipidemia, unspecified: Secondary | ICD-10-CM

## 2012-10-22 DIAGNOSIS — I10 Essential (primary) hypertension: Secondary | ICD-10-CM

## 2012-10-22 LAB — CBC WITH DIFFERENTIAL/PLATELET
Basophils Absolute: 0 10*3/uL (ref 0.0–0.1)
Eosinophils Absolute: 0.2 10*3/uL (ref 0.0–0.7)
Hemoglobin: 12.3 g/dL (ref 12.0–15.0)
Lymphocytes Relative: 50.9 % — ABNORMAL HIGH (ref 12.0–46.0)
MCHC: 34 g/dL (ref 30.0–36.0)
Monocytes Relative: 8.3 % (ref 3.0–12.0)
Neutro Abs: 2.5 10*3/uL (ref 1.4–7.7)
Neutrophils Relative %: 37.1 % — ABNORMAL LOW (ref 43.0–77.0)
RBC: 3.92 Mil/uL (ref 3.87–5.11)
RDW: 13.3 % (ref 11.5–14.6)

## 2012-10-22 LAB — BASIC METABOLIC PANEL
CO2: 27 mEq/L (ref 19–32)
Calcium: 9.4 mg/dL (ref 8.4–10.5)
Chloride: 102 mEq/L (ref 96–112)
Creatinine, Ser: 0.9 mg/dL (ref 0.4–1.2)
Glucose, Bld: 100 mg/dL — ABNORMAL HIGH (ref 70–99)

## 2012-10-22 LAB — HEPATIC FUNCTION PANEL
ALT: 20 U/L (ref 0–35)
Albumin: 3.7 g/dL (ref 3.5–5.2)
Bilirubin, Direct: 0.1 mg/dL (ref 0.0–0.3)
Total Protein: 7.1 g/dL (ref 6.0–8.3)

## 2012-10-22 LAB — LIPID PANEL
Cholesterol: 189 mg/dL (ref 0–200)
HDL: 65.2 mg/dL (ref >39.00)
Triglycerides: 123 mg/dL (ref 0.0–149.0)

## 2012-10-22 LAB — TSH: TSH: 0.73 u[IU]/mL (ref 0.35–5.50)

## 2012-11-10 ENCOUNTER — Encounter: Payer: Self-pay | Admitting: Pulmonary Disease

## 2012-11-10 ENCOUNTER — Telehealth: Payer: Self-pay | Admitting: Pulmonary Disease

## 2012-11-10 ENCOUNTER — Telehealth: Payer: Self-pay | Admitting: Cardiology

## 2012-11-10 MED ORDER — AMOXICILLIN-POT CLAVULANATE 875-125 MG PO TABS: 1.0000 | ORAL_TABLET | Freq: Two times a day (BID) | ORAL | Status: DC

## 2012-11-10 MED ORDER — METHYLPREDNISOLONE (PAK) 4 MG PO TABS: ORAL_TABLET | ORAL | Status: DC

## 2012-11-10 NOTE — Telephone Encounter (Signed)
New prob  Pt states for the past three nights she wakes up with a rapid heart rate. She said after she goes back to sleep and wakes up she is ok.

## 2012-11-10 NOTE — Telephone Encounter (Signed)
Spoke with pt, Aware of dr crenshaw's recommendations.  °

## 2012-11-10 NOTE — Telephone Encounter (Signed)
Spoke with pt, for the last 4 days she has woke in the middle of the night with her heart racing. She is usually dreaming when this happens. It will last about 10 mins and then calm back down. She currently take metoprolol 25 mg one in the am and two in the pm. She has been referred to vascular at [facility] for her carotids, otherwise nothing else has changed. She has no other symptoms when she wakes with this rapid pulse. Will forward for dr Jens Som review

## 2012-11-10 NOTE — Telephone Encounter (Signed)
I spoke with pt. She c/o sinus HA, facial pressure, nasal congestion off and on x 3 days. Denies any cough, no drainage. She has tried benadryl but did not help. Per pt she was told she can't take a decongestant d/t HBP. Please advise SN thanks Last OV 10/13/12 Allergies  Allergen Reactions  . Tetracycline Other (See Comments)    GI bleed  . Codeine Nausea And Vomiting    GI upset/vomit

## 2012-11-10 NOTE — Telephone Encounter (Signed)
Per SN---  Call in augmentin 875 mg  #14  1 po bid Align once daily Medrol dosepak  #1  Take as directed

## 2012-11-10 NOTE — Telephone Encounter (Signed)
Continue beta blocker; if continues, will need cardionet. Angela [name]

## 2012-11-10 NOTE — Telephone Encounter (Signed)
Pt aware of recs. RX's called in. Nothing further was needed

## 2012-11-17 ENCOUNTER — Other Ambulatory Visit: Payer: Self-pay

## 2012-12-07 ENCOUNTER — Other Ambulatory Visit: Payer: Self-pay | Admitting: Pulmonary Disease

## 2012-12-08 ENCOUNTER — Other Ambulatory Visit: Payer: Self-pay | Admitting: Pulmonary Disease

## 2012-12-23 ENCOUNTER — Ambulatory Visit (INDEPENDENT_AMBULATORY_CARE_PROVIDER_SITE_OTHER): Payer: Medicare PPO | Admitting: Gastroenterology

## 2012-12-23 ENCOUNTER — Encounter: Payer: Self-pay | Admitting: Gastroenterology

## 2012-12-23 VITALS — BP 100/70 | HR 72 | Ht 61.5 in | Wt 161.0 lb

## 2012-12-23 DIAGNOSIS — K219 Gastro-esophageal reflux disease without esophagitis: Secondary | ICD-10-CM

## 2012-12-23 DIAGNOSIS — E739 Lactose intolerance, unspecified: Secondary | ICD-10-CM

## 2012-12-23 DIAGNOSIS — K589 Irritable bowel syndrome without diarrhea: Secondary | ICD-10-CM

## 2012-12-23 DIAGNOSIS — R141 Gas pain: Secondary | ICD-10-CM

## 2012-12-23 DIAGNOSIS — R140 Abdominal distension (gaseous): Secondary | ICD-10-CM

## 2012-12-23 MED ORDER — VSL#3 PO CAPS: 1.0000 | ORAL_CAPSULE | Freq: Every day | ORAL | Status: DC

## 2012-12-23 MED ORDER — RIFAXIMIN 550 MG PO TABS: 550.0000 mg | ORAL_TABLET | Freq: Two times a day (BID) | ORAL | Status: DC

## 2012-12-23 NOTE — Patient Instructions (Addendum)
Samples of a probiotic VSL # 3 was given today. Please take one capsule by mouth once daily. Have to be kept in refrigerator  We have sent the following medications to your pharmacy for you to pick up at your convenience: Xifaxan 550 mg, please take tablet by mouth two times daily for two weeks and then stop.   Please follow up with Dr. Jarold Motto in one month.  Information on Lactose Free Diet is below.  ____________________________________________________________________________________________________________________  Lactose Intolerance, Adult Lactose intolerance is when the body is not able to digest lactose, a sugar found in milk and milk products. Lactose intolerance is caused by your body not producing enough of the enzyme lactase. When there is not enough lactase to digest the amount of lactose consumed, discomfort may be felt. Lactose intolerance is not a milk allergy. For most people, lactase deficiency is a condition that develops naturally over time. After about the age of 2, the body begins to produce less lactase. But many people may not experience symptoms until they are much older. CAUSES Things that can cause you to be lactose intolerant include:  Aging.  Being born without the ability to make lactase.  Certain digestive diseases.  Injuries to the small intestine. SYMPTOMS   Feeling sick to your stomach (nauseous).  Diarrhea.  Cramps.  Bloating.  Gas. Symptoms usually show up a half hour or 2 hours after eating or drinking products containing lactose. TREATMENT  No treatment can improve the body's ability to produce lactase. However, symptoms can be controlled through diet. A medicine may be given to you to take when you consume lactose-containing foods or drinks. The medicine contains the lactase enzyme, which help the body digest lactose better. HOME CARE INSTRUCTIONS  Eat or drink dairy products as told by your caregiver or dietician.  Take all medicine  as directed by your caregiver.  Find lactose-free or lactose-reduced products at your local grocery store.  Talk to your caregiver or dietician to decide if you need any dietary supplements. The following is the amount of calcium needed from the diet:  19 to 50 years: 1000 mg  Over 50 years: 1200 mg Calcium and Lactose in Common Foods Non-Dairy Products / Calcium Content (mg)  Calcium-fortified orange juice, 1 cup / 308 to 344 mg  Sardines, with edible bones, 3 oz / 270 mg  Salmon, canned, with edible bones, 3 oz / 205 mg  Soymilk, fortified, 1 cup / 200 mg  Broccoli (raw), 1 cup / 90 mg  Orange, 1 medium / 50 mg  Pinto beans,  cup / 40 mg  Tuna, canned, 3 oz / 10 mg  Lettuce greens,  cup / 10 mg Dairy Products / Calcium Content (mg) / Lactose Content (g)  Yogurt, plain, low-fat, 1 cup / 415 mg / 5 g  Milk, reduced fat, 1 cup / 295 mg / 11 g  Swiss cheese, 1 oz / 270 mg / 1 g  Ice cream,  cup / 85 mg / 6 g  Cottage cheese,  cup / 75 mg / 2 to 3 g SEEK MEDICAL CARE IF: You have no relief from your symptoms. Document Released: 03/31/2005 Document Revised: 06/23/2011 Document Reviewed: 06/28/2010 [facility] Patient Information 2014 [place], [place].

## 2012-12-23 NOTE — Progress Notes (Signed)
This is a 67 year old African American female who continues to complain of epigastric discomfort, gas, bloating, belching, burping, and falx flatus with normal gas pounds but no diarrhea, dysphagia, hepatobiliary complaints.  She is status post cholecystectomy.  She takes Nexium chronically along with Colestid, when necessary Librax, and a variety of other medications.  She is lactose intolerant but denies use of nonabsorbable carbohydrates and other foods.  Is been no anorexia, weight loss, fever, chills, hepatobiliary or lower GI complaints.  She recently took some antibiotics for sinus infection several weeks ago without any appreciable difference in her chronic symptoms.  She is up-to-date her endoscopy and colonoscopy and has a known hiatal hernia, and is being treated for acid   Current Medications, Allergies, Past Medical History, Past Surgical History, Family History and Social History were reviewed in [employer] record.  ROSwere reviewed and are negative unless otherwise stated in the HPI.: All systems           Physical Exam: Blood pressure 170, pulse 72 and regular and weight 161 with a BMI of 29.93.  Chest is clear and she is a regular rhythm without murmurs gallops or rubs.  I cannot appreciate hepatosplenomegaly, abdominal masses or tenderness.  Bowel sounds are normal and nonobstructive.  There is no evidence of significant distention.  Mental status is normal.    Assessment and Plan: IBS-type complaints, possible bacterial overgrowth syndrome.  I have placed her on a FOD-MAP diet for IBS and we'll try Xifaxan 550 mg twice a day for [redacted] weeks along with VSL #3  probiotics.  I will see her back in one month's time for followup.  She also has known lactose intolerance he is to avoid major lactose products in her diet.

## 2013-01-04 ENCOUNTER — Other Ambulatory Visit: Payer: Self-pay | Admitting: Pulmonary Disease

## 2013-01-10 ENCOUNTER — Other Ambulatory Visit: Payer: Self-pay | Admitting: Pulmonary Disease

## 2013-01-27 ENCOUNTER — Encounter: Payer: Self-pay | Admitting: Gastroenterology

## 2013-01-27 ENCOUNTER — Ambulatory Visit (INDEPENDENT_AMBULATORY_CARE_PROVIDER_SITE_OTHER): Payer: Medicare PPO | Admitting: Gastroenterology

## 2013-01-27 ENCOUNTER — Ambulatory Visit (INDEPENDENT_AMBULATORY_CARE_PROVIDER_SITE_OTHER): Payer: Medicare PPO

## 2013-01-27 ENCOUNTER — Telehealth: Payer: Self-pay | Admitting: Pulmonary Disease

## 2013-01-27 VITALS — BP 98/62 | HR 76 | Ht 61.5 in | Wt 160.0 lb

## 2013-01-27 DIAGNOSIS — K589 Irritable bowel syndrome without diarrhea: Secondary | ICD-10-CM

## 2013-01-27 DIAGNOSIS — Z23 Encounter for immunization: Secondary | ICD-10-CM

## 2013-01-27 DIAGNOSIS — K6389 Other specified diseases of intestine: Secondary | ICD-10-CM

## 2013-01-27 DIAGNOSIS — K219 Gastro-esophageal reflux disease without esophagitis: Secondary | ICD-10-CM

## 2013-01-27 MED ORDER — VSL#3 PO CAPS: 1.0000 | ORAL_CAPSULE | Freq: Every day | ORAL | Status: DC

## 2013-01-27 NOTE — Patient Instructions (Signed)
VSL # 3 samples were given today, please keep in the refrigerator  Please follow up with Dr. Jarold Motto in one year or sooner if symptoms reoccur

## 2013-01-27 NOTE — Telephone Encounter (Signed)
Per SN---  Yes she is 68 y/o now and indication os one pneumonia vaccine after age 26----ok for her to have the pneumonvax.  thanks

## 2013-01-27 NOTE — Progress Notes (Signed)
History of Present Illness: This is a 67 year old Caucasian female with multiple GI problems include IBS, acid reflux, and chronic bowel regularity.  Her symptoms are much improved after treatment for bacterial overgrowth syndrome with Xifaxan and probiotics.  She currently denies GI complaints while on multiple medications listed and reviewed her record.    Current Medications, Allergies, Past Medical History, Past Surgical History, Family History and Social History were reviewed in [employer] record.  ROS: All systems were reviewed and are negative unless otherwise stated in the HPI.         Assessment and plan: Good response to empiric treatment for bacterial overgrowth syndrome.  We will continue VSL #3 probiotic since he seemed to have helped her symptoms greatly.  She may need periodic treatment with Xifaxan depending on her clinical course.  First echo and medic conditions as listed and reviewed

## 2013-01-27 NOTE — Telephone Encounter (Signed)
[name], CMA  

## 2013-01-27 NOTE — Telephone Encounter (Signed)
I spoke with pt. She reports she has not ever had an PNA vaccine. She wants to know if she needs one. Please advise SN thanks Last OV 10/13/12

## 2013-01-28 NOTE — Telephone Encounter (Signed)
Home # busy, LMTCB on cell #. Carron Curie, CMA

## 2013-01-31 ENCOUNTER — Telehealth: Payer: Self-pay | Admitting: Gastroenterology

## 2013-01-31 NOTE — Telephone Encounter (Signed)
lmtcb x1 for pt. 

## 2013-01-31 NOTE — Telephone Encounter (Signed)
Pt seen 01/27/13 as a f/u post Entocort therapy and she was started on VSL#3 probiotics. Pt reports she has noticed twice BRB on the tissue when she wiped after a BM. She did not strain or wasn't constipated when having the BMs. Does pt need to come in or have occult blood test? Thanks.

## 2013-02-01 NOTE — Telephone Encounter (Signed)
No concern,,,use prn analpram,,,,many functional issues here

## 2013-02-01 NOTE — Telephone Encounter (Signed)
Called spoke with patient, advised of SN's recs as below.  Pt stated that she keeps her grandson during the day, so will call back regarding scheduling appt for her PNA shot.  Advised pt that this is a vaccine that we keep year-round, so if she would like she can get this at her next appt.  Pt okay with this and verbalized her understanding.  Nothing further needed; will sign off.

## 2013-02-05 ENCOUNTER — Other Ambulatory Visit: Payer: Self-pay | Admitting: Pulmonary Disease

## 2013-02-14 NOTE — Telephone Encounter (Signed)
This message was lost? Pt called back today to report she had another episode of bleeding yesterday with cramping. The blood is on the tissue and is BRB and she has not been straining and the stool was loose. She reports she has been on long term tetracycline therapy and doesn't know if that caused it. Pt will see Midge Minium tomorrow.

## 2013-02-15 ENCOUNTER — Ambulatory Visit (INDEPENDENT_AMBULATORY_CARE_PROVIDER_SITE_OTHER): Payer: Medicare PPO | Admitting: Nurse Practitioner

## 2013-02-15 ENCOUNTER — Encounter: Payer: Self-pay | Admitting: Nurse Practitioner

## 2013-02-15 VITALS — BP 110/70 | HR 70 | Ht 61.5 in | Wt 160.8 lb

## 2013-02-15 DIAGNOSIS — K648 Other hemorrhoids: Secondary | ICD-10-CM

## 2013-02-15 MED ORDER — HYDROCORTISONE ACETATE 25 MG RE SUPP: 25.0000 mg | Freq: Every evening | RECTAL | Status: DC | PRN

## 2013-02-15 NOTE — Patient Instructions (Signed)
We have sent the following medications to your pharmacy for you to pick up at your convenience: Hydrocortisone suppositories, use 1 at bedtime for seven days.  Please call us to let us know if you are experiencing any bleeding.                                               We are excited to introduce MyChart, a new best-in-class service that provides you online access to important information in your electronic medical record. We want to make it easier for you to view your health information - all in one secure location - when and where you need it. We expect MyChart will enhance the quality of care and service we provide.  When you register for MyChart, you can:    View your test results.    Request appointments and receive appointment reminders via email.    Request medication renewals.    View your medical history, allergies, medications and immunizations.    Communicate with your physician's office through a password-protected site.    Conveniently print information such as your medication lists.  To find out if MyChart is right for you, please talk to a member of our clinical staff today. We will gladly answer your questions about this free health and wellness tool.  If you are age 110 or older and want a member of your family to have access to your record, you must provide written consent by completing a proxy form available at our office. Please speak to our clinical staff about guidelines regarding accounts for patients younger than age 48.  As you activate your MyChart account and need any technical assistance, please call the MyChart technical support line at [phone]-CHART [phone]) or email your question to [email or web address]. If you email your question(s), please include your name, a return phone number and the best time to reach you.  If you have non-urgent health-related questions, you can send a message to our office through MyChart at [email or web address]. If  you have a medical emergency, call 911.  Thank you for using MyChart as your new health and wellness resource!   MyChart licensed from [employer],  [id number]. Patents Pending.

## 2013-02-15 NOTE — Progress Notes (Signed)
     History of Present Illness:   Patient is a 67 year old female with multiple medical problems known to Dr. Jarold Motto. She has chronic gastrointestinal problems such as IBS, GERD, epigastric pain, and gastroparesis. EGD July 2013 revealed mild gastritis and a large hiatal hernia. 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 .   Patient worked in today for evaluation of rectal bleeding. She saw some blood on tissue after BM on 10/20. She had a second episode this past Sunday two days ago. Both times stools were soft, she did not strain. No abdominal pain. No rectal pain.   Current Medications, Allergies, Past Medical History, Past Surgical History, Family History and Social History were reviewed in [employer] record.  Physical Exam: General: Pleasant, well developed , black female in no acute distress Head: Normocephalic and atraumatic Eyes:  sclerae anicteric, conjunctiva pink  Ears: Normal auditory acuity Lungs: Clear throughout to auscultation Heart: Regular rate and rhythm Abdomen: Soft, non distended, non-tender. No masses, no hepatomegaly. Normal bowel sounds Rectal: a few perianal skin tags from previous hemorrhoids. Scant normal, heme negative stool in vault. On anoscopy there were a few internal hemorrhoids Musculoskeletal: Symmetrical with no gross deformities  Extremities: No edema  Neurological: Alert oriented x 4, grossly nonfocal Psychological:  Alert and cooperative. Normal mood and affect  Assessment and Recommendations:      53. 67 year old female with two episodes of small volume rectal bleeding (tissue) after soft BM. Suspect internal hemorrhoids source of bleeding. Will treat with steroid suppositories for one week. Patient will call if she has recurrent bleeding.   2. 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

## 2013-03-07 ENCOUNTER — Telehealth: Payer: Self-pay | Admitting: Pulmonary Disease

## 2013-03-07 ENCOUNTER — Telehealth: Payer: Self-pay | Admitting: Gastroenterology

## 2013-03-07 MED ORDER — VSL#3 PO CAPS: 1.0000 | ORAL_CAPSULE | Freq: Every day | ORAL | Status: DC

## 2013-03-07 MED ORDER — AMOXICILLIN-POT CLAVULANATE 875-125 MG PO TABS: 1.0000 | ORAL_TABLET | Freq: Two times a day (BID) | ORAL | Status: DC

## 2013-03-07 NOTE — Telephone Encounter (Signed)
RX sent   Patient notified  

## 2013-03-07 NOTE — Telephone Encounter (Signed)
Pt advised of recs and rx sent. Basheer Molchan, CMA  

## 2013-03-07 NOTE — Telephone Encounter (Signed)
I called and spoke with pt. She c/o chest congestion, cough w/ Hach-yellow phlem, sore throat, PND x 1 week. She has been using OTC throat spray w/o relief. Please advise SN thanks  Allergies  Allergen Reactions  . Tetracycline Other (See Comments)    GI bleed  . Codeine Nausea And Vomiting    GI upset/vomit

## 2013-03-07 NOTE — Telephone Encounter (Signed)
Per SN--  augmentin 875 mg  #14  1 po bid Align once daily mucinex 2 po bid with plenty of fluids.

## 2013-03-14 ENCOUNTER — Telehealth: Payer: Self-pay | Admitting: Gastroenterology

## 2013-03-14 ENCOUNTER — Telehealth: Payer: Self-pay | Admitting: Internal Medicine

## 2013-03-14 NOTE — Telephone Encounter (Signed)
I called patient and per patient [facility] tried to give her a RX for Align  Patient said that she did want Align I advised patient that I will call [facility] and call her back I called [facility] and spoke with pharmacist Trinna Post  Per Trinna Post they had no VSL # 3 in stock so they were giving Align as a substitute  Trinna Post said he can order today and have ready for pick up tomorrow at 2 pm I called patient and advised her what happen and her RX will be there tomorrow after 2 pm Patient verbalized understanding

## 2013-03-14 NOTE — Telephone Encounter (Signed)
On call- Was given augmentin for sinusitis. Sinus disease markedly better, still some Schwebke nasal discharge after 4/7 days. Now diarrhea. Hx IBS so she says she can manage, but had been told to call.  Recommended - try to get in one more day of abx (11/30), then stop and call office first of week.

## 2013-03-28 ENCOUNTER — Telehealth: Payer: Self-pay | Admitting: Pulmonary Disease

## 2013-03-28 MED ORDER — METHYLPREDNISOLONE (PAK) 4 MG PO TABS: ORAL_TABLET | ORAL | Status: DC

## 2013-03-28 MED ORDER — LEVOFLOXACIN 500 MG PO TABS: 500.0000 mg | ORAL_TABLET | Freq: Every day | ORAL | Status: DC

## 2013-03-28 NOTE — Telephone Encounter (Signed)
Last OV 10/13/12. Pt called in on 11/24 and was given augmentin. Pt states she improved after taking this medication but now x 3 days she has begun to have symptoms again. Pt states she has chest and sinus congestion, productive cough with Harada phlegm that is tinged with blood at times, sneezing, PND, mucus in her throat. Pt states she is taking mucinex and this helps with he mucus some. Please advise. Carron Curie, CMA Allergies  Allergen Reactions  . Tetracycline Other (See Comments)    GI bleed  . Codeine Nausea And Vomiting    GI upset/vomit

## 2013-03-28 NOTE — Telephone Encounter (Signed)
Per SN---  Continue the mucinex 2 po bid  Increase fluids levaquin 500 mg  #7  1 daily otc align once daily Medrol dosepak  For the inflammation.   Called and spoke with pt and she is aware of meds sent to the pharmacy.  Nothing further is needed.

## 2013-04-03 ENCOUNTER — Other Ambulatory Visit: Payer: Self-pay | Admitting: Pulmonary Disease

## 2013-04-05 ENCOUNTER — Telehealth: Payer: Self-pay | Admitting: Pulmonary Disease

## 2013-04-05 MED ORDER — LEVOTHYROXINE SODIUM 75 MCG PO TABS: 75.0000 ug | ORAL_TABLET | Freq: Every day | ORAL | Status: DC

## 2013-04-05 NOTE — Telephone Encounter (Signed)
Spoke with pt. Duplicate message. Will sign off

## 2013-04-05 NOTE — Telephone Encounter (Signed)
Rx has been sent in. Pt is aware. 

## 2013-04-26 ENCOUNTER — Other Ambulatory Visit: Payer: Self-pay | Admitting: Pulmonary Disease

## 2013-04-29 ENCOUNTER — Ambulatory Visit (INDEPENDENT_AMBULATORY_CARE_PROVIDER_SITE_OTHER): Payer: Medicare PPO | Admitting: Gastroenterology

## 2013-04-29 ENCOUNTER — Telehealth: Payer: Self-pay | Admitting: Cardiology

## 2013-04-29 ENCOUNTER — Encounter: Payer: Self-pay | Admitting: Gastroenterology

## 2013-04-29 VITALS — BP 110/64 | HR 66 | Ht 61.5 in | Wt 164.0 lb

## 2013-04-29 DIAGNOSIS — R143 Flatulence: Secondary | ICD-10-CM

## 2013-04-29 DIAGNOSIS — R142 Eructation: Secondary | ICD-10-CM

## 2013-04-29 DIAGNOSIS — R140 Abdominal distension (gaseous): Secondary | ICD-10-CM

## 2013-04-29 DIAGNOSIS — R141 Gas pain: Secondary | ICD-10-CM

## 2013-04-29 DIAGNOSIS — K589 Irritable bowel syndrome without diarrhea: Secondary | ICD-10-CM

## 2013-04-29 DIAGNOSIS — E739 Lactose intolerance, unspecified: Secondary | ICD-10-CM

## 2013-04-29 NOTE — Telephone Encounter (Signed)
New Message  Pt called states that her Neurologist is requesting that the patient to stop taking premarin because of the problems with her carotid arteries. Pt states that her Neurologist is suggesting that it is increasing her chances of blood clots. Test were taken at [facility] and the pt would like to discuss those as well.. Please call back to assist.

## 2013-04-29 NOTE — Patient Instructions (Signed)
Please make a follow up appointment with Dr. Juanda ChanceBrodie in one month   Information on Artifical Sweeteners/Sorbitol for your review

## 2013-04-29 NOTE — Progress Notes (Signed)
This is a 68 year old African American female with IBS with chronic gas and bloating.  She recently had some asymptomatic rectal bleeding which resolved.  She currently is on VSL#3 and a special low gas diet.  She seems to be doing better symptomatically he has few complaints today.  Her appetite is good her weight is stable.  She denies upper GI or hepatobiliary complaints.  She was Dr. Juanda [name] to be a new physician upon my retirement.  Current Medications, Allergies, Past Medical History, Past Surgical History, Family History and Social History were reviewed in [facility] electronic medical record.  ROS: All systems were reviewed and are negative unless otherwise stated in the HPI.          Physical Exam: Blood pressure 110/64, pulse 66 and regular and weight 164.  There is no hepatosplenomegaly, abdominal masses or tenderness.  Bowel sounds are normal.  Mental status is normal.  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  We will give her one month of probiotic therapy along with her special diet and she will followup with Dr. Juanda [name] in one month's time for followup.  The patient has a long extensive past history of GI complaints has had multiple evaluations.  Most success with care has been involved with [facility] and frequent office visits.

## 2013-04-29 NOTE — Telephone Encounter (Addendum)
Spoke with pt, she saw dr Oneida Arenaselliott lewis, a neurologist, he sees her for migraines. 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 Discussed with dr Jens Somcrenshaw, pt instructed to talk with her GYN, she is not on the premarin for her heart and it will be fine to stop from a cardiac standpoint. Patient voiced understanding

## 2013-05-03 ENCOUNTER — Other Ambulatory Visit: Payer: Self-pay | Admitting: Pulmonary Disease

## 2013-05-05 ENCOUNTER — Telehealth: Payer: Self-pay | Admitting: Cardiology

## 2013-05-05 MED ORDER — METOPROLOL TARTRATE 50 MG PO TABS: 50.0000 mg | ORAL_TABLET | Freq: Two times a day (BID) | ORAL | Status: DC

## 2013-05-05 NOTE — Telephone Encounter (Signed)
New Prob   Pt says she is experiencing palpitations and would like to be seen today. Please call.

## 2013-05-05 NOTE — Telephone Encounter (Signed)
Change metoprolol to 50 bid Angela MillersBrian [name]

## 2013-05-05 NOTE — Telephone Encounter (Signed)
Spoke with pt, Aware of dr crenshaw's recommendations.  °

## 2013-05-05 NOTE — Telephone Encounter (Signed)
Spoke with pt, for the last couple days she has been having increased palpitations. She notices them esp at night and when moving around, they have even woke her from her sleep. She can not tell how long it last she does not notice it if she is busy. She states it feels like her heart is fluttering. Her current lopressor dosage is 25 mg in the am and 50 mg in the pm. She reports the last time this happened we increased her metoprolol. She checked her pulse while on the phone and it was 60. She is going out of town tomorrow and wants to know what to do or if she can be seen. Will forward for dr Jens Somcrenshaw review

## 2013-05-11 ENCOUNTER — Telehealth: Payer: Self-pay | Admitting: Cardiology

## 2013-05-11 NOTE — Telephone Encounter (Signed)
Spoke with pt, since the increase in the metoprolol to 50 mg bid she cont to wake at night with palpitations. She denies SOB or CP. She will cont the metoprolol as ordered until seen Monday. Pt agreed with this plan.

## 2013-05-11 NOTE — Telephone Encounter (Signed)
New Message  Pt called states that she is experiencing palpitations. She says her medications were adjusted recently however, she is still feeling 'Wierd". Made appt with NP, Norma FredricksonLori Gerhardt for 05/16/2013 at 8:30 am. Pt is requesting a call back to discuss.

## 2013-05-12 ENCOUNTER — Other Ambulatory Visit: Payer: Self-pay | Admitting: Neurology

## 2013-05-12 DIAGNOSIS — I773 Arterial fibromuscular dysplasia: Secondary | ICD-10-CM

## 2013-05-16 ENCOUNTER — Ambulatory Visit (INDEPENDENT_AMBULATORY_CARE_PROVIDER_SITE_OTHER): Payer: Medicare PPO | Admitting: Nurse Practitioner

## 2013-05-16 ENCOUNTER — Encounter (INDEPENDENT_AMBULATORY_CARE_PROVIDER_SITE_OTHER): Payer: Medicare PPO

## 2013-05-16 ENCOUNTER — Encounter: Payer: Self-pay | Admitting: Nurse Practitioner

## 2013-05-16 VITALS — BP 120/80 | HR 66 | Ht 62.0 in | Wt 166.4 lb

## 2013-05-16 DIAGNOSIS — R002 Palpitations: Secondary | ICD-10-CM

## 2013-05-16 LAB — BASIC METABOLIC PANEL
BUN: 13 mg/dL (ref 6–23)
CO2: 25 mEq/L (ref 19–32)
Calcium: 9 mg/dL (ref 8.4–10.5)
Chloride: 106 mEq/L (ref 96–112)
Creatinine, Ser: 0.8 mg/dL (ref 0.4–1.2)
GFR: 90.76 mL/min (ref >60.00)
Glucose, Bld: 93 mg/dL (ref 70–99)
Potassium: 3.7 mEq/L (ref 3.5–5.1)
Sodium: 138 mEq/L (ref 135–145)

## 2013-05-16 LAB — MAGNESIUM: Magnesium: 1.3 mg/dL — ABNORMAL LOW (ref 1.5–2.5)

## 2013-05-16 LAB — TSH: TSH: 0.13 u[IU]/mL — ABNORMAL LOW (ref 0.35–5.50)

## 2013-05-16 NOTE — Progress Notes (Signed)
Angela [name] Date of Birth: [birth year] Medical Record #[id number]  History of Present Illness: Angela [name] is seen back today. Seen for Dr. Jens Som. She has a history of palpitations. Has had past workup in 2011 with echo showing normal LV function with mild MR. Stress echo in 2011 showed no stress induced wall motion abnormalities. Event monitor showed PVCs. She has been treated with beta blockers. 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 MRA also consisted with FMD. Other issues include HTN, HLD, GERD, IBS, OA, fibromyalgia, & anxiety.   Last seen here in April of 2014 - seemed to be doing ok.   Comes back today. Here alone. Doing ok. This is an early visit. Says she has started having DOE over the last several weeks along with more palpitations - they usually do not occur simultaneously. The palpitations is waking her up at night. She has been more anxious. Has been taken off of her estrogen due to threat of stroke and she is quite anxious that she will have a migraine due to the hormone change. DOE only with exertion. Has not totally taken herself off of caffeine. Has had some chocolate. No actual chest pain reported. Despite the increase in her beta blocker, her symptoms have persisted.   Current Outpatient Prescriptions  Medication Sig Dispense Refill  . AMBULATORY NON FORMULARY MEDICATION Lidocaine HCL Isotonic (45ml) 4% Nasal Spray As needed      . aspirin 81 MG tablet Take 81 mg by mouth daily.        . Carboxymethylcellul-Glycerin (OPTIVE) 0.5-0.9 % SOLN Apply to eye as needed.        . CELEBREX 200 MG capsule TAKE 1 CAPSULE BY MOUTH EVERY DAY  30 capsule  5  . chlorzoxazone (PARAFON) 500 MG tablet Take 500 mg by mouth 4 (four) times daily as needed for muscle spasms.      . clidinium-chlordiazePOXIDE (LIBRAX) 2.5-5 MG per capsule Take 1 capsule by mouth 3 (three) times daily as needed.  60  capsule  3  . clonazePAM (KLONOPIN) 0.5 MG tablet 1 po bid as needed  60 tablet  0  . colestipol (COLESTID) 1 G tablet Take 1 g by mouth daily.      Marland Kitchen esomeprazole (NEXIUM) 40 MG capsule Take 1 capsule (40 mg total) by mouth 2 (two) times daily.  60 capsule  10  . hydrochlorothiazide (HYDRODIURIL) 25 MG tablet TAKE 1 TABLET (25 MG TOTAL) BY MOUTH DAILY.  30 tablet  5  . HYDROcodone-acetaminophen (NORCO/VICODIN) 5-325 MG per tablet Take 1-2 tablets by mouth every 4 (four) hours as needed.       . hydrocortisone (ANUSOL-HC) 25 MG suppository Place 1 suppository (25 mg total) rectally at bedtime as needed for hemorrhoids or itching.  14 suppository  0  . latanoprost (XALATAN) 0.005 % ophthalmic solution Place 1 drop into both eyes at bedtime.      Marland Kitchen levothyroxine (SYNTHROID, LEVOTHROID) 75 MCG tablet Take 1 tablet (75 mcg total) by mouth daily before breakfast.  30 tablet  5  . metoprolol tartrate (LOPRESSOR) 50 MG tablet Take 1 tablet (50 mg total) by mouth 2 (two) times daily.  60 tablet  11  . potassium chloride SA (K-DUR,KLOR-CON) 20 MEQ tablet TAKE 1 TABLET (20 MEQ TOTAL) BY MOUTH 2 (TWO) TIMES DAILY.  60 tablet  8  . Probiotic Product (VSL#3) CAPS Take 1  capsule by mouth daily.  30 capsule  3  . Sulfacetamide Sodium (OVACE PLUS EX) Apply topically as needed.      . traMADol (ULTRAM) 50 MG tablet Take 50 mg by mouth as needed.       . Triamcinolone Acetonide (NASACORT AQ NA) Place into the nose as needed.       Marland Kitchen estrogens, conjugated, (PREMARIN) 0.625 MG tablet Take 0.625 mg by mouth daily. Take daily for 21 days then do not take for 7 days.       . Vitamin D, Ergocalciferol, (DRISDOL) 50000 UNITS CAPS TAKE ONE TABLET BY MOUTH ONCE A WEEK  4 capsule  6   No current facility-administered medications for this visit.    Allergies  Allergen Reactions  . Tetracycline Other (See Comments)    GI bleed  . Codeine Nausea And Vomiting    GI upset/vomit    Past Medical History  Diagnosis Date    . Hypertension   . Hyperlipidemia   . Hypothyroidism   . Hiatal hernia 04/25/2005  . GERD (gastroesophageal reflux disease) 09/13/2007  . Diverticulosis 05/10/2004  . IBS (irritable bowel syndrome)   . Colon polyps   . Osteoarthritis   . Fibromyalgia   . Vitamin D deficiency   . Migraine   . Anxiety   . Acne rosacea   . Bile salt-induced diarrhea   . Gastroparesis   . External hemorrhoids 05/10/2004  . Esophagitis 04/25/2005  . Duodenitis 04/25/2005  . Esophageal stricture 09/13/2007  . Sessile colonic polyp 05/30/2009  . Degenerative disc disease   . Vertigo   . 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 Other and unspecified noninfectious gastroenteritis and colitis(558.9)   . Glaucoma     Past Surgical History  Procedure Laterality Date  . Cholecystectomy  1985  . Vaginal hysterectomy  1993  . Cesarean section    . Foot osteotomy  2006    Right foot  . Tubal ligation  1983  . Bunionectomy  09/04/2011    left    History  Smoking status  . Never Smoker   Smokeless tobacco  . Never Used    History  Alcohol Use No    Family History  Problem Relation Age of Onset  . Mitral valve prolapse Mother   . Hypertension Father   . Hypertension Sister     x2  . Diabetes Sister   . Colon cancer Maternal Aunt   . Stomach cancer Maternal Aunt   . Colon polyps Father   . Colon polyps Sister   . Stroke Father     Review of Systems: The review of systems is per the HPI.  All other systems were reviewed and are negative.  Physical Exam: BP 120/80  Pulse 66  Ht 5\' 2"  (1.575 m)  Wt 166 lb 6.4 oz (75.479 kg)  BMI 30.43 kg/m2 Patient is very pleasant and in no acute distress. Skin is warm and dry. Color is normal.  HEENT is unremarkable. Normocephalic/atraumatic. PERRL. Sclera are nonicteric. Neck is supple. No masses. No JVD. Lungs are clear. Cardiac exam shows a regular rate and rhythm. Abdomen is soft. Extremities are without edema. Gait and ROM are  intact. No gross neurologic deficits noted.  Wt Readings from Last 3 Encounters:  05/16/13 166 lb 6.4 oz (75.479 kg)  04/29/13 164 lb (74.39 kg)  02/15/13 160 lb 12.8 oz (72.938 kg)     LABORATORY DATA: EKG shows sinus rhythm.  Labs are pending  Lab Results  Component Value Date   WBC 6.7 10/22/2012   HGB 12.3 10/22/2012   HCT 36.2 10/22/2012   PLT 330.0 10/22/2012   GLUCOSE 100* 10/22/2012   CHOL 189 10/22/2012   TRIG 123.0 10/22/2012   HDL 65.20 10/22/2012   LDLDIRECT 126.2 09/19/2010   LDLCALC 99 10/22/2012   ALT 20 10/22/2012   AST 22 10/22/2012   NA 138 10/22/2012   K 3.6 10/22/2012   CL 102 10/22/2012   CREATININE 0.9 10/22/2012   BUN 13 10/22/2012   CO2 27 10/22/2012   TSH 0.73 10/22/2012   INR 0.94 12/15/2009     Assessment / Plan:  1. Palpitations - recurrent - happening every day - will place 24 hour Holter. Recheck baseline labs today. For now, no change in medicines - may need to consider referral to EP.  2. FMD of the carotids - for repeat MRA later this month   3. HTN - BP looks good  4. DOE - will arrange for stress echo  5. Anxiety - this seems to be playing a role/trigger in her symptoms  Further disposition to follow.   Patient is agreeable to this plan and will call if any problems develop in the interim.   Rosalio MacadamiaLori C. Mycheal Veldhuizen, RN, ANP-C [facility] [address] [place], [place]  [id number] [phone]

## 2013-05-16 NOTE — Patient Instructions (Addendum)
Stay on your current medicines for now  We will arrange for a stress echo and a 24 hour Holter monitor  We will check labs today  Totally decaffeinate yourself - no more tea or chocolate  We will then decide about our next step of treatment  Call the [facility] office at [phone] if you have any questions, problems or concerns.

## 2013-05-17 ENCOUNTER — Telehealth: Payer: Self-pay | Admitting: Pulmonary Disease

## 2013-05-17 NOTE — Telephone Encounter (Signed)
Pt had blood work done at Cardiology. TSH was abnormal was told to contact our office about getting medication changed. Labs are in Epic.  SN - please advise on medication change. Thanks.

## 2013-05-18 MED ORDER — LEVOTHYROXINE SODIUM 50 MCG PO TABS: 50.0000 ug | ORAL_TABLET | Freq: Every day | ORAL | Status: DC

## 2013-05-18 NOTE — Telephone Encounter (Signed)
Per SN---  On levothyroid 75 mcg daily x years.   recs from SN is to decrease the levothyroid to 50 mcg daily and pt will need ROV with SN for recheck and labs in 6-8 weeks on the new dose.    Called and spoke with pt and she is aware of dose change in levothyroid to 50 mcg daily.  New rx has been sent to her pharmacy.  Pt is aware of appt with SN on 3/19 at 12.

## 2013-05-19 ENCOUNTER — Telehealth: Payer: Self-pay | Admitting: *Deleted

## 2013-05-19 NOTE — Telephone Encounter (Signed)
Spoke with pt, aware monitor reviewed by dr Jens Somcrenshaw shows sinus with pac's and pvc's. Palpitations associated with sinus rhythm and sinus with rare pac.

## 2013-05-23 ENCOUNTER — Ambulatory Visit
Admission: RE | Admit: 2013-05-23 | Discharge: 2013-05-23 | Disposition: A | Discharge status: Home / Self Care | Payer: Medicare PPO | Source: Ambulatory Visit | Attending: Neurology | Admitting: Neurology

## 2013-05-23 DIAGNOSIS — I773 Arterial fibromuscular dysplasia: Secondary | ICD-10-CM

## 2013-05-23 MED ORDER — GADOBENATE DIMEGLUMINE 529 MG/ML IV SOLN
15.0000 mL | Freq: Once | INTRAVENOUS | Status: AC | PRN
  Administered 2013-05-23: 15 mL via INTRAVENOUS

## 2013-05-28 ENCOUNTER — Other Ambulatory Visit: Payer: Self-pay | Admitting: Pulmonary Disease

## 2013-06-02 ENCOUNTER — Other Ambulatory Visit: Payer: Self-pay | Admitting: Pulmonary Disease

## 2013-06-02 ENCOUNTER — Encounter: Payer: Self-pay | Admitting: Cardiology

## 2013-06-03 ENCOUNTER — Encounter: Payer: Self-pay | Admitting: Adult Health

## 2013-06-03 ENCOUNTER — Ambulatory Visit (INDEPENDENT_AMBULATORY_CARE_PROVIDER_SITE_OTHER): Payer: Medicare PPO | Admitting: Adult Health

## 2013-06-03 ENCOUNTER — Telehealth: Payer: Self-pay | Admitting: Pulmonary Disease

## 2013-06-03 VITALS — BP 118/78 | HR 86 | Ht 62.0 in | Wt 162.2 lb

## 2013-06-03 DIAGNOSIS — J019 Acute sinusitis, unspecified: Secondary | ICD-10-CM

## 2013-06-03 MED ORDER — AZITHROMYCIN 250 MG PO TABS: ORAL_TABLET | ORAL | Status: AC

## 2013-06-03 NOTE — Telephone Encounter (Signed)
Pt scheduled to come in and see TP this AM. Nothing further needed

## 2013-06-03 NOTE — Progress Notes (Signed)
Subjective:    Patient ID: Angela [name], female    DOB: Feb 27, 1946, 68 y.o.   MRN: [id number]  HPI 68 y/o BF    06/03/2013 Acute OV  Complains of  sinus pressure, PND, cough (non-productive) and sore throat x4 days. Complains of sinus congestion, unable to get much out. Now coughing up Lemmons mucus.  No fever, chest pain, hemoptysis, n/v/d or abd pain.  Took benadryl and mucinex with out much help.  No recent travel or abx use.            Problem List:  HYPERTENSION (ICD-401.9) - controlled on METOPROLOL 25mg  Bid & HCT 25mg /d & K20/d...  ~  12/12:  BP=116/74  today and doing well- denies HA, visual changes, CP, palipit, dizziness, syncope, dyspnea, edema, etc... ~  6/13:  BP= 112/68 & she denies signif CP, palpit, SOB, edema, etc... ~  CXR 6/13 showed normal heart size, clear lungs, scoliosis & degen arthritis, GB clips... ~  11/13:  BP= 110/78 & she denies specific CP, palpit, SOB, edema... ~  7/14: on Metop25Bid, Hct25, K20; BP=110/72 & denies CP, palpit, SOB, edema, etc...  CHEST PAIN, ATYPICAL (ICD-786.59) - on ASA 81mg /d... hx CWP x 25 yrs assoc w/ her FM... symptoms flair on & off, but doing OK overall & no angina. ~  9/11: developed chest "pressure" assoc w/ palpit & went to ER w/ 1d adm by TH> EKG, Enz, CXR, Labs- essent neg (K=3.2 & supplemented, had rare PVC on monitor)... referred to [facility]. ~  9/11:  seen by DrCrenshaw w/ 2DEcho showing mild MR, norm LVF;  Stress Echo 9/11 showed normal LVF & no stress induced wall motion abnormalities... ~  2/12: she had cardiac eval by DrCrenshaw w/ norm EKG, doing well overall just some palpit at night & he rec additional 25mg  Metoprolol prn.  PALPITATIONS (ICD-785.1)  <SEE ABOVE> she's noted tachypalpit & assoc w/ vivid dreams waking her from sleep... she was given METOPROLOL 25mg - 1/2 Bid in hosp 9/11 (monitor w/ PVC only) & this was incr to 25mg  Bid by DrCrenshaw... we called in KLONOPIN 0.5mg  Bid which helped the palpit as well...  she was also drinking mod caffeine & taking Pseudophed> asked to stop both... ~  CardioNet 9/11 showed NSR w/ PVC's, treated w/ BBlocker.  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 she saw DrLewitt for eval HAs & he did MRI at [facility] confirming no signif plaque & prob Fibromusc Hyperplasia... of note> she has mild HBP easily controlled on HCT & subseq RADopplers 9/11 showed normal (norm RAs, norm Ao, norm renal size etc)... she remains on ASA 81mg /d. ~  she continues to f/u w/ DrLewitt who does repeat MRI/ MRA periodically to follow. ~  RA Dopplers 9/11 were normal- no plaque & no fibromuschyperplasia...  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 ~  5-6/13:  She saw DrLewitt for f/u>  Right CBruit w/ CDoppler showing ?bilat 50-69% ICA stenoses; subseq MRA of the neck showed prom diffuse fibromusc changes & he discussed this w/ IR DrDeveshwar- he rec just observ due to the fact that she is asymptomatic w/o neuro ischemic symptoms (DrLewitt offered Vasc surg consultation at a medical center)... ~  MRA Neck 1/14 showed stable  appearance of fibromusc dysplasia w/ RCA stenosis (unchanged)... ~  6/14: f/u eval by DrLewitt> pt reports that he is sending her to [facility] for their opinion regarding treatment...  HYPERCHOLESTEROLEMIA (ICD-272.0) - prev on Simva20 but she stopped on her own due to wt gain & muscle aches... ~  FLP 6/09 showed TChol 220, TG 88, HDL 67, LDL 138... rec- start Simva20, + diet/ exercise. ~  FLP 03/13/08 showed TChol 188, TG 95, HDL, 80, LDL 89... looks great- same Rx. ~  3/10 stopped Simva20 for 1 mo due to muscle symptoms- no change off med therefore restarted. ~  FLP 9/10 on Simva20 showed TChol 160. TG  101, HDL 64, LDL 76 ~  FLP 4/11 on Simva20 showed TChol 181, TG 117, HDL 73, LDL 85 ~  FLP in hosp 9/11 showed TChol 187, TG 204, HDL 63, LDL 83 ~  She subsequently stopped Simva on her own due to wt gain & muscle aches, better off this med. ~  FLP 6/12 off Simva on diet alone showed TChol 211, TG 129, HDL 65, LDL 126... rec better diet + exercise, she doesn't want meds. 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 currently taking LEVOTHYROID Daily... ~  labs 9/10 showed TSH= 0.19 ~  labs 4/11 showed TSH= 0.28 ~  labs 8/11 by DrPatterson showed TSH= 0.10 ~  labs 9/11 in hosp showed TSH= 0.91, FreeT3= 2.3 (2.3-4.2), FreeT4= 1.07 (0.80-1.80) ~  labs 6/12 on Levo75 showed TSH= 0.38 ~  Labs 6/13 on Levothy75 showed TSH= 0.30 ~  Labs 7/14 on Levothy75 showed TSH= 0.73  HIATAL HERNIA (ICD-553.3), GERD & GASTROPARESIS - Rx NEXIUM 40mg /d, DOMPERIDONE 10mg  Tid, CARAFATE Prn (which she takes for "real bad" indigestion), PANCREASE Tid, and now probiotics (ALIGN).Marland Kitchen. ~  EGD by DrPatterson was 1/07 and showed 4cm HH, esophagitis, & duodenitis...  ~  repeat EGD 09/13/07 w/ 3cmHH, gastritis, stricture- dilated... ~  treated 6/10 for gas/ bloating/ discomfort w/ Xifaxan but no better... ~  repeat EGD 9/11 by DrPatterson showed HH, otherw normal & bx was neg... ~  Gastric Emptying scan 4/12 showed 87% retention at 1H & 58% retention at 2H (norm <30% at [facility]). ~  EGD 7/13 by DrPatterson showed a large 5-6cmHH, gastitis, bx neg for HPylori, Rec- continue same.  DIVERTICULAR DISEASE (ICD-562.10) IRRITABLE BOWEL SYNDROME, HX OF (ICD-V12.79) COLONIC POLYPS (ICD-211.3) -  ~  colonoscopy was 1/06 showing divertics and hems... f/u planned 5 yrs. ~  12/09 had eval for abd pain & other symptoms- CT w/ ?sm abscess-  ?diverticulits... f/u colonoscopy 1/10 by DrPatterson w/ severe pan-diverticulosis but no inflamm (resolved on Cipro/ Flagyl Rx)... ~  2/11: another eval from DrPatterson for gas/ bloating/ etc> colonoscopy showed divertics & tiny hyperpl polyp removed... ~  8/11: on going eval from DrPatterson (LUQ pain & nausea)- CT Abd showed large & sm bowel divertics w/o inflamm, s/p GB & Hyst, & mild fatty liver dis... ~  Colonoscopy 7/13 by DrPatterson showed severe divertic throughout colon, Rec- hi fiber.  OSTEOARTHRITIS (ICD-715.90) - sees DrDaldorf... hx bilat shoulder problems (impingement & chr instability) w/ shots & PT- see his notes. CERVICAL & LUMBAR SPONDYLOSIS FIBROMYALGIA (ICD-729.1) - DrDeveshwar tried Lyrica 50mg Tid but off this now per DrLewitt due to blurry vision &  15# weight gain... she takes SKELAXIN 800mg TidPrn, CELEBREX (ave 1 tab Qod), and Tylenol...  ~  she saw DrRowe yrs ago and had Rheum re-evaluation by Rober Minion 6/09- they discussed Lyrica/ Topamax/ Flexeril therapy (also rec Align for her IBS, Urology referral for her IC, supplements for her DJD, & lab work up for her fatigue)... ~  now she says DrLewitt is treating her FM> she goes to [facility] for massage & water exercises.  VITAMIN D DEFICIENCY (ICD-268.9) - on Vit D 50000 u weekly... ~  labs 6/09 showed Vit D level = 22... start 50K weekly... ~  labs 11/09 showed Vit D level = 51... asked to switch to OTC Vit D supplement but she continued the perscription. ~  Labs 6/12 showed Vit D level = 61... She prefers the 50K weekly Rx... ~  Labs 6/13 showed Vit D level = 52  MIGRAINE HEADACHE (ICD-346.90) - prev used Midrin, plus the above meds and is followed by DrLewitt... also saw him for dizziness and Dx w/ benign positional vertigo Rx w/ exercises... ~  DrLewitt doesn't want the use of Midrin due to vasoconstrictor properties...  ANXIETY (ICD-300.00) - on KLONOPIN 0.5mg  Bid...  ACNE ROSACEA (ICD-695.3)  Health  Maintenance - GYN= DrARoss (on Premarin) & he does PAP smears, Mammograms, & BMD's on her...   Past Surgical History  Procedure Laterality Date  . Cholecystectomy  1985  . Vaginal hysterectomy  1993  . Cesarean section    . Foot osteotomy  2006    Right foot  . Tubal ligation  1983  . Bunionectomy  09/04/2011    left    Outpatient Encounter Prescriptions as of 06/03/2013  Medication Sig  . AMBULATORY NON FORMULARY MEDICATION Lidocaine HCL Isotonic (45ml) 4% Nasal Spray As needed  . aspirin 81 MG tablet Take 81 mg by mouth daily.    . Carboxymethylcellul-Glycerin (OPTIVE) 0.5-0.9 % SOLN Apply to eye as needed.    . celecoxib (CELEBREX) 200 MG capsule TAKE 1 CAPSULE BY MOUTH EVERY DAY  . chlorzoxazone (PARAFON) 500 MG tablet Take 500 mg by mouth 4 (four) times daily as needed for muscle spasms.  . clidinium-chlordiazePOXIDE (LIBRAX) 2.5-5 MG per capsule Take 1 capsule by mouth 3 (three) times daily as needed.  . clonazePAM (KLONOPIN) 0.5 MG tablet 1 po bid as needed  . colestipol (COLESTID) 1 G tablet Take 1 g by mouth daily.  Marland Kitchen esomeprazole (NEXIUM) 40 MG capsule Take 1 capsule (40 mg total) by mouth 2 (two) times daily.  . hydrochlorothiazide (HYDRODIURIL) 25 MG tablet TAKE 1 TABLET (25 MG TOTAL) BY MOUTH DAILY.  Marland Kitchen HYDROcodone-acetaminophen (NORCO/VICODIN) 5-325 MG per tablet Take 1-2 tablets by mouth every 4 (four) hours as needed.   . hydrocortisone (ANUSOL-HC) 25 MG suppository Place 1 suppository (25 mg total) rectally at bedtime as needed for hemorrhoids or itching.  . latanoprost (XALATAN) 0.005 % ophthalmic solution Place 1 drop into both eyes at bedtime.  Marland Kitchen levothyroxine (SYNTHROID, LEVOTHROID) 50 MCG tablet Take 1 tablet (50 mcg total) by mouth daily before breakfast.  . metoprolol tartrate (LOPRESSOR) 50 MG tablet Take 1 tablet (50 mg total) by mouth 2 (two) times daily.  Marland Kitchen PARoxetine Mesylate (BRISDELLE) 7.5 MG CAPS Take 1 tablet by mouth daily.  . potassium chloride SA  (K-DUR,KLOR-CON) 20 MEQ tablet TAKE 1 TABLET (20 MEQ TOTAL) BY MOUTH 2 (TWO) TIMES DAILY.  . Probiotic Product (VSL#3) CAPS Take 1 capsule by mouth daily.  . Sulfacetamide Sodium (OVACE PLUS EX) Apply  topically as needed.  . traMADol (ULTRAM) 50 MG tablet Take 50 mg by mouth as needed.   . Triamcinolone Acetonide (NASACORT AQ NA) Place into the nose as needed.   . Vitamin D, Ergocalciferol, (DRISDOL) 50000 UNITS CAPS capsule TAKE ONE TABLET BY MOUTH ONCE A WEEK  . [DISCONTINUED] estrogens, conjugated, (PREMARIN) 0.625 MG tablet Take 0.625 mg by mouth daily. Take daily for 21 days then do not take for 7 days.     Allergies  Allergen Reactions  . Tetracycline Other (See Comments)    GI bleed  . Codeine Nausea And Vomiting    GI upset/vomit    Current Medications, Allergies, Past Medical History, Past Surgical History, Family History, and Social History were reviewed in [employer] record.    Review of Systems        See HPI - all other systems neg except as noted...  The patient denies anorexia, fever, weight loss, weight gain, vision loss, hoarseness, syncope, peripheral edema, prolonged cough, hemoptysis, melena, hematochezia, hematuria, incontinence, suspicious skin lesions, transient blindness, depression, unusual weight change, abnormal bleeding, enlarged lymph nodes, and angioedema.   Objective:   Physical Exam   WD, WN, 67 y/o BF in NAD...  Vital Signs:  Reviewed... GENERAL:  Alert & oriented; pleasant & cooperative... HEENT:  [place]/AT, EOM-full, PERRLA, EACs-clear, TMs-wnl, NOSE- clear drainage, max tenderness,  THROAT-clear & wnl. NECK:  Supple w/ fair ROM; no JVD;  no thyromegaly or nodules palpated; no lymphadenopathy. CHEST:  Clear to P & A; without wheezes/ rales/ or rhonchi heard HEART:  Regular Rhythm; without murmurs/ rubs/ or gallops detected... ABDOMEN:  Soft & nontender; normal bowel sounds; no organomegaly or masses detected. EXT: without  deformities or arthritic changes; no varicose veins/ venous insuffic/ or edema. NEURO:  no focal neuro deficits.Marland KitchenMarland Kitchen

## 2013-06-03 NOTE — Assessment & Plan Note (Signed)
URI /Sinusitis   Plan  Z-Pak take as directed. Mucinex DM twice daily as needed. For cough and congestion. Saline nasal rinses as needed. Fluids and rest Tylenol as needed Please contact office for sooner follow up if symptoms do not improve or worsen or seek emergency care  Follow up Dr. Kriste BasqueNadel  As planned next week and As needed

## 2013-06-03 NOTE — Patient Instructions (Signed)
Z-Pak take as directed. Mucinex DM twice daily as needed. For cough and congestion. Saline nasal rinses as needed. Fluids and rest Tylenol as needed Please contact office for sooner follow up if symptoms do not improve or worsen or seek emergency care  Follow up Dr. Kriste BasqueNadel  As planned next week and As needed

## 2013-06-09 ENCOUNTER — Other Ambulatory Visit (HOSPITAL_COMMUNITY): Payer: Medicare PPO

## 2013-06-10 ENCOUNTER — Other Ambulatory Visit: Payer: Self-pay | Admitting: Nurse Practitioner

## 2013-06-14 ENCOUNTER — Telehealth: Payer: Self-pay | Admitting: Pulmonary Disease

## 2013-06-14 MED ORDER — PREDNISONE (PAK) 5 MG PO TABS: ORAL_TABLET | ORAL | Status: DC

## 2013-06-14 MED ORDER — LEVOFLOXACIN 500 MG PO TABS: 500.0000 mg | ORAL_TABLET | Freq: Every day | ORAL | Status: DC

## 2013-06-14 NOTE — Telephone Encounter (Signed)
Per SN: need to continue Mucinex DM 2tabs BID; call in Levaquin 500mg  #7 1po QD, Pred Pak 5mg  6day pack.  Thanks.  Called spoke with patient and advised of SN's recs as stated above.  Pt verbalized her understanding and denied any questions.  Pt aware to call the office if her symptoms do not improve or worsen.  Rx's sent to verified pharmacy.  Nothing further needed; will sign off.

## 2013-06-14 NOTE — Telephone Encounter (Signed)
lmomtcb for pt 

## 2013-06-14 NOTE — Telephone Encounter (Signed)
Pt was seen by TP on 06/03/13:  Patient Instructions     Z-Pak take as directed.  Mucinex DM twice daily as needed. For cough and congestion.  Saline nasal rinses as needed.  Fluids and rest  Tylenol as needed  Please contact office for sooner follow up if symptoms do not improve or worsen or seek emergency care  Follow up Dr. Kriste BasqueNadel As planned next week and As needed    -----  Called, spoke with pt. She finished the zpak but is still coughing with yellow mucus, has chest congestion, and PND.  No wheezing, chest tightness/CP, or fever.  She stopped the mucinex x 3 days ago.  Has been using alka seltzer, saline nasal rinses, and nasocort.  She is requesting further recs -- wondering if she needs another abx.  Pls advise.  Thank you.  [facility] [address]  Allergies verified with pt Allergies  Allergen Reactions  . Tetracycline Other (See Comments)    GI bleed  . Codeine Nausea And Vomiting    GI upset/vomit

## 2013-06-14 NOTE — Telephone Encounter (Signed)
Pt returned call

## 2013-06-30 ENCOUNTER — Other Ambulatory Visit (INDEPENDENT_AMBULATORY_CARE_PROVIDER_SITE_OTHER): Payer: Medicare PPO

## 2013-06-30 ENCOUNTER — Encounter: Payer: Self-pay | Admitting: Pulmonary Disease

## 2013-06-30 ENCOUNTER — Ambulatory Visit (INDEPENDENT_AMBULATORY_CARE_PROVIDER_SITE_OTHER)
Admission: RE | Admit: 2013-06-30 | Discharge: 2013-06-30 | Disposition: A | Discharge status: Home / Self Care | Payer: Medicare PPO | Source: Ambulatory Visit | Attending: Pulmonary Disease | Admitting: Pulmonary Disease

## 2013-06-30 ENCOUNTER — Ambulatory Visit (INDEPENDENT_AMBULATORY_CARE_PROVIDER_SITE_OTHER): Payer: Medicare PPO | Admitting: Pulmonary Disease

## 2013-06-30 VITALS — BP 110/66 | HR 64 | Temp 97.1°F | Ht 61.0 in | Wt 161.0 lb

## 2013-06-30 DIAGNOSIS — K3184 Gastroparesis: Secondary | ICD-10-CM

## 2013-06-30 DIAGNOSIS — K589 Irritable bowel syndrome without diarrhea: Secondary | ICD-10-CM

## 2013-06-30 DIAGNOSIS — R05 Cough: Secondary | ICD-10-CM

## 2013-06-30 DIAGNOSIS — M1990 Unspecified osteoarthritis, unspecified site: Secondary | ICD-10-CM

## 2013-06-30 DIAGNOSIS — K5730 Diverticulosis of large intestine without perforation or abscess without bleeding: Secondary | ICD-10-CM

## 2013-06-30 DIAGNOSIS — I10 Essential (primary) hypertension: Secondary | ICD-10-CM

## 2013-06-30 DIAGNOSIS — IMO0001 Reserved for inherently not codable concepts without codable children: Secondary | ICD-10-CM

## 2013-06-30 DIAGNOSIS — R059 Cough, unspecified: Secondary | ICD-10-CM

## 2013-06-30 DIAGNOSIS — I773 Arterial fibromuscular dysplasia: Secondary | ICD-10-CM

## 2013-06-30 DIAGNOSIS — E785 Hyperlipidemia, unspecified: Secondary | ICD-10-CM

## 2013-06-30 DIAGNOSIS — I7789 Other specified disorders of arteries and arterioles: Secondary | ICD-10-CM

## 2013-06-30 DIAGNOSIS — F419 Anxiety disorder, unspecified: Secondary | ICD-10-CM

## 2013-06-30 DIAGNOSIS — F329 Major depressive disorder, single episode, unspecified: Secondary | ICD-10-CM

## 2013-06-30 DIAGNOSIS — R002 Palpitations: Secondary | ICD-10-CM

## 2013-06-30 DIAGNOSIS — E039 Hypothyroidism, unspecified: Secondary | ICD-10-CM

## 2013-06-30 DIAGNOSIS — K219 Gastro-esophageal reflux disease without esophagitis: Secondary | ICD-10-CM

## 2013-06-30 LAB — TSH: TSH: 0.4 u[IU]/mL (ref 0.35–5.50)

## 2013-06-30 MED ORDER — HYDROCODONE-ACETAMINOPHEN 5-325 MG PO TABS: 1.0000 | ORAL_TABLET | Freq: Three times a day (TID) | ORAL | Status: DC | PRN

## 2013-06-30 MED ORDER — TRAMADOL HCL 50 MG PO TABS: 50.0000 mg | ORAL_TABLET | Freq: Three times a day (TID) | ORAL | Status: DC | PRN

## 2013-06-30 NOTE — Progress Notes (Signed)
Subjective:    Patient ID: SIMRAT KENDRICK, female    DOB: Jan 08, 1946, 68 y.o.   MRN: [id number]  HPI 68 y/o BF here for a follow up visit... she has mult medical problems as listed below...   ~  September 24, 2011:  79mo ROV & Shailey is c/o soreness in her ribcage & chest wall w/ a flair in her FM> on Celebrex, Vicodin, Tramadol (from Ortho for pinched nerve), Lidoderm, Parafon... She had a sinus infection treated 2/13 by TP w/ ZPak & improved...     She had GI f/u DrPatterson>  HxIBS w/ alt diarrhea/ constip; Currently on Xifaxan, Nexium, & he started "Digest" antispasmotic capsBid    She saw DrLewitt for f/u 5/13>  Right CBruit w/ CDoppler showing ?bilat 50-69% ICA stenoses; subseq MRA of the neck showed prom diffuse fibromusc changes & he discussed this w/ IR DrDeveshwar- he rec just observ due to the fact that she is asymptomatic w/o neuro ischemic symptoms (DrLewitt offered Vasc surg consultation at a medical center); Note- he did not want to use Midrin for HAs due to it's vasoconstrictor properties...     We reviewed prob list, meds, xrays and labs> see below>> she is rec to take her Klonopin 1/2 to 1 tab Bid regularly...  CXR 6/13 showed normal heart size, clear lungs, scoliosis & degen arthritis, GB clips  LABS 6/13:  FLP- at goals on Colestipol;  Chems- ok x K=3.4 (K incr to 20Bid);  CBC- ok;  TSH= 0.30 on Levothy75;  VitD=52  ~  March 10, 2012:  79mo ROV & Betsaida continues to see mult specialists for her medical problems...    Dizziness, AtypCP, Palpit> on Meclizine25, ASA81, Klonopin0.5; notes 1epis dizziness, drainage, congestion- refilled Mecliz, Mucinex, Saline...    HBP> on Metop25Bid, Hct25, K20; BP=110/78 7 denies CP, palpit, SOB, edema, etc...    CBruit, Fibromusc Hyperplasia> followed by DrLewitt & TDeveshwar on observ- f/u Q33mo & he has been doing MRA Neck & CDuplex Q20mo- see below...    CHOL> on diet & Colestipol; FLP 6/13 showed TChol 180, TG 121, HDL 63, LDL 93    Hypothy>  on Levo75; TSH 6/13 = 0.30 & she denies over or under symptoms...    HH, GERD, Gastroparesis> on Nexium40Bid; c/o epig pain- she had EGD 7/13 by DrPatterson- large 5-6cmHH, gastitis, bx neg for HPylori...    Divertics, IBS-D, Polyps> on Librax prn; she had colonoscopy 7/13 DrPatterson- severe divertic throughout colon, Rec- hi fiber...    S/P GB w/ bile salt diarrhea> on Colestid- supposed to take 2gm regularly...    DJD, FM> on Celebrex, Tramadol, Parafon; she has mult somatic complaints...    VitD defic> on VitD 50K wkly; Vit D level 6/13 = 52...    Anxiety> on Klonopin0.5Bid prn; encouraged to take more regularly... We reviewed prob list, meds, xrays and labs> see below for updates >>   ~  October 13, 2012:  30mo ROV & Aubreyana reports that recent check by DrLewitt for her carotid fibromusc hyperplasia showed some worsening 7 he is referring her to [facility] for their opinion re: treatment; We reviewed the following medical problems during today's office visit >>     Dizziness, AtypCP, Palpit> on Meclizine25, ASA81, Klonopin0.5; notes intermit dizziness, drainage, congestion- refilled Mecliz, Mucinex, Saline...    HBP> on Metop25Bid, Hct25, K20; BP=110/72 & denies CP, palpit, SOB, edema, etc; she had f/u drCrenshaw 3/14- stable, no changes made...    CBruit, Fibromusc Hyperplasia> followed by DrLewitt &  TDeveshwar on observ- f/u Q29mo & he has been doing MRA Neck & CDuplex Q118mo- seen recently & she indicates that he is referring her to [facility]...    CHOL> on diet & Colestipol; FLP 7/14 showed TChol 189, TG 123, HDL 65, LDL 99    Hypothy> on Levo75; TSH 7/14 = 0.73 & she denies over or under symptoms...    HH, GERD, Gastroparesis> on Nexium40Bid; c/o epig pain- she had EGD 7/13 by DrPatterson- large 5-6cmHH, gastitis, bx neg for HPylori...    Divertics, IBS-D, Polyps> on Probiotic + Librax prn; she had colonoscopy 7/13 DrPatterson- severe divertic throughout colon, Rec- hi fiber...    S/P GB w/ bile salt diarrhea>  on Colestid- supposed to take 2gm regularly...    DJD, FM> on Celebrex, Tramadol, Lidoderm; she has mult somatic complaints...    VitD defic> on VitD 50K wkly; Vit D level 6/13 = 52...    Anxiety> on Klonopin0.5Bid prn; encouraged to take more regularly... We reviewed prob list, meds, xrays and labs> see below for updates >>   LABS 7/14:   FLP- at goals on diet Rx;  Chems- wnl;  CBC- wnl;  TSH=0.73  ~  June 30, 2013:  48mo ROV & Anabeth had lab work done by Cards 2/15 & her TSH was oversuppressed on the Synthroid75 (TSH=0.13) so we decreased her to Levothy88mcg/d; feeling well- no over or underactive symptoms & due for recheck TSH today... We reviewed the following medical problems during today's office visit >>     Dizziness, AtypCP, Palpit> on Meclizine25, ASA81, Klonopin0.5; notes intermit dizziness, drainage, congestion- continue same...    HBP> on Metop50Bid, Hct25-1/2, K20Bid; BP=110/66 & denies CP, palpit, SOB, edema, etc; she had f/u DrCrenshaw 2/15- stable, no changes made...    CBruit, Fibromusc Hyperplasia> on ASA81; followed by DrLewitt & TDeveshwar on observ- f/u Q110mo & he has been doing MRA Neck & CDuplex Q65mo- seen 2/15 & MRI unchanged FMdysplasia & assoc stenoses in ICAs & vertebrals; they sent her to [facility] for 2nd opinion, saw DrLevy- they did CDoppler (60-79% bilat ICA stenoses) "just watch it"... They stopped her Premarin.    CHOL> on diet & Colestipol; FLP 7/14 showed TChol 189, TG 123, HDL 65, LDL 99    Hypothy> on Levo50 now; TSH 3/15 = 0.40 & she denies over or under symptoms; we decided to cut back further to 1/2 daily, recheck TSH in 18mo...    HH, GERD, Gastroparesis> on Nexium40Bid; c/o epig pain- she had EGD 7/13 by DrPatterson- large 5-6cmHH, gastitis, bx neg for HPylori...    Divertics, IBS-D, Polyps> on Probiotic + Librax, Colestid, AnusolHC; she had colonoscopy 7/13 DrPatterson- severe divertic throughout colon, Rec- hi fiber...    S/P GB w/ bile salt diarrhea> on  Colestid- supposed to take 2gm regularly...    DJD, FM> on Celebrex, Vicodin, Tramadol, Lidoderm; she has mult somatic complaints...    VitD defic> on VitD 50K wkly; Vit D level 6/13 = 52...    Anxiety> on Klonopin0.5Bid prn; encouraged to take more regularly... We reviewed prob list, meds, xrays and labs> see below for updates >>   CXR 3/15 showed norm heart size, clear lungs, NAD...   LABS 3/15:  TSH=0.40 on Synthroid50, therefore rec decr to 1/2 tab daily 7 recheck in 6-8wks...           Problem List:  HYPERTENSION (ICD-401.9) - controlled on METOPROLOL 25mg  Bid & HCT 25mg /d & K20/d...  ~  12/12:  BP=116/74  today and  doing well- denies HA, visual changes, CP, palipit, dizziness, syncope, dyspnea, edema, etc... ~  6/13:  BP= 112/68 & she denies signif CP, palpit, SOB, edema, etc... ~  CXR 6/13 showed normal heart size, clear lungs, scoliosis & degen arthritis, GB clips... ~  11/13:  BP= 110/78 & she denies specific CP, palpit, SOB, edema... ~  7/14: on Metop25Bid, Hct25, K20; BP=110/72 & denies CP, palpit, SOB, edema, etc... ~  3/15: on Metop50Bid, Hct25-1/2, K20Bid; BP=110/66 & denies CP, palpit, SOB, edema, etc; she had f/u DrCrenshaw 2/15- stable, no changes made.  CHEST PAIN, ATYPICAL (ICD-786.59) - on ASA /d... hx CWP x 25 yrs assoc w/ her FM... symptoms flair on & off, but doing OK overall & no angina. ~  9/11: developed chest "pressure" assoc w/ palpit & went to ER w/ 1d adm by TH> EKG, Enz, CXR, Labs- essent neg (K=3.2 & supplemented, had rare PVC on monitor)... referred to [facility]. ~  9/11:  seen by DrCrenshaw w/ 2DEcho showing mild MR, norm LVF;  Stress Echo 9/11 showed normal LVF & no stress induced wall motion abnormalities... ~  2/12: she had cardiac eval by DrCrenshaw w/ norm EKG, doing well overall just some palpit at night & he rec additional  Metoprolol prn. ~  2/15: she had f/u DrCrenshaw/ LGerhardt> c/o palpit, mild MR, norm LVF; prev event monitor w/ PVCs;   They did stress echo & 24hHolter; Labs showed supressed TSH at 0.30 & we cut back on her synthroid replacement rx...   EKG 2/15 showed NSR, rate65, wnl, NAD...   Holter 2/15 w/ NSR- PACs & PVCs, c/o palpit assoc w/ NSR on the holter...   Stress Echo 4/14 showed mild nonspecific T wave inversions in the inferolateral leads during exercise and recovery, no stress arrhythmias or conduction abnormalities, stress ECG was negative for ischemia.  PALPITATIONS (ICD-785.1)  <SEE ABOVE> she's noted tachypalpit & assoc w/ vivid dreams waking her from sleep... she was given METOPROLOL - 1/2 Bid in hosp 9/11 (monitor w/ PVC only) & this was incr to  Bid by DrCrenshaw... we called in KLONOPIN 0.5mg  Bid which helped the palpit as well... she was also drinking mod caffeine & taking Pseudophed> asked to stop both... ~  CardioNet 9/11 showed NSR w/ PVC's, treated w/ BBlocker.  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 she saw DrLewitt for eval HAs & he did MRI at [facility] confirming no signif plaque & prob Fibromusc Hyperplasia... of note> she has mild HBP easily controlled on HCT & subseq RADopplers 9/11 showed normal (norm RAs, norm Ao, norm renal size etc)... she remains on ASA /d. ~  she continues to f/u w/ DrLewitt who does repeat MRI/ MRA periodically to follow. ~  RA Dopplers 9/11 were normal- no plaque & no fibromuschyperplasia...  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 ~  5-6/13:  She saw DrLewitt for f/u>  Right CBruit w/ CDoppler showing ?bilat 50-69% ICA stenoses; subseq MRA of the neck showed prom diffuse fibromusc changes & he discussed this w/ IR DrDeveshwar- he rec just observ due to the fact that she is  asymptomatic w/o neuro ischemic symptoms (DrLewitt offered Vasc surg consultation at a medical center)... ~  MRA Neck 1/14 showed  stable appearance of fibromusc dysplasia w/ RCA stenosis (unchanged)... ~  6/14: f/u eval by DrLewitt> pt reports that he is sending her to [facility] for their opinion regarding treatment... ~  2/15: MRI unchanged FMdysplasia & assoc stenoses in ICAs & vertebrals; they sent her to [facility] for 2nd opinion, saw DrLevy- they did CDoppler (60-79% bilat ICA stenoses) "just watch it"... They stopped her Premarin.  HYPERCHOLESTEROLEMIA (ICD-272.0) - prev on Simva20 but she stopped on her own due to wt gain & muscle aches... ~  FLP 6/09 showed TChol 220, TG 88, HDL 67, LDL 138... rec- start Simva20, + diet/ exercise. ~  FLP 03/13/08 showed TChol 188, TG 95, HDL, 80, LDL 89... looks great- same Rx. ~  3/10 stopped Simva20 for 1 mo due to muscle symptoms- no change off med therefore restarted. ~  FLP 9/10 on Simva20 showed TChol 160. TG 101, HDL 64, LDL 76 ~  FLP 4/11 on Simva20 showed TChol 181, TG 117, HDL 73, LDL 85 ~  FLP in hosp 9/11 showed TChol 187, TG 204, HDL 63, LDL 83 ~  She subsequently stopped Simva on her own due to wt gain & muscle aches, better off this med. ~  FLP 6/12 off Simva on diet alone showed TChol 211, TG 129, HDL 65, LDL 126... rec better diet + exercise, she doesn't want meds. 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 currently taking LEVOTHYROID Daily... ~  labs 9/10 showed TSH= 0.19 ~  labs 4/11 showed TSH= 0.28 ~  labs 8/11 by DrPatterson showed TSH= 0.10 ~  labs 9/11 in hosp showed TSH= 0.91, FreeT3= 2.3 (2.3-4.2), FreeT4= 1.07 (0.80-1.80) ~  labs 6/12 on Levo75 showed TSH= 0.38 ~  Labs 6/13 on Levothy75 showed TSH= 0.30 ~  Labs 7/14 on Levothy75  showed TSH= 0.73 ~  Labs 2/15 on Levothy75 showed TSH= 0.13... We decr her Synthroid to 62mcg/d... ~  Labs 4/15 on Levothy50 showed TSH= 0.40... We decided to decr the Synthroid50 to 1/2 tab daily...  HIATAL HERNIA (ICD-553.3), GERD & GASTROPARESIS - Rx NEXIUM /d, DOMPERIDONE  Tid, CARAFATE Prn (which she takes for "real bad" indigestion), PANCREASE Tid, and now probiotics (ALIGN).Marland Kitchen. ~  EGD by DrPatterson was 1/07 and showed 4cm HH, esophagitis, & duodenitis...  ~  repeat EGD 09/13/07 w/ 3cmHH, gastritis, stricture- dilated... ~  treated 6/10 for gas/ bloating/ discomfort w/ Xifaxan but no better... ~  repeat EGD 9/11 by DrPatterson showed HH, otherw normal & bx was neg... ~  Gastric Emptying scan 4/12 showed 87% retention at 1H & 58% retention at 2H (norm <30% at [facility]). ~  EGD 7/13 by DrPatterson showed a large 5-6cmHH, gastitis, bx neg for HPylori, Rec- continue same.  DIVERTICULAR DISEASE (ICD-562.10) IRRITABLE BOWEL SYNDROME, HX OF (ICD-V12.79) COLONIC POLYPS (ICD-211.3) -  ~  colonoscopy was 1/06 showing divertics and hems... f/u planned 5 yrs. ~  12/09 had eval for abd pain & other symptoms- CT w/ ?sm abscess- ?diverticulits... f/u colonoscopy 1/10 by DrPatterson w/ severe pan-diverticulosis but no inflamm (resolved on Cipro/ Flagyl Rx)... ~  2/11: another eval from DrPatterson for gas/ bloating/ etc> colonoscopy showed divertics & tiny hyperpl polyp removed... ~  8/11: on going eval from DrPatterson (LUQ pain & nausea)- CT Abd showed large &  sm bowel divertics w/o inflamm, s/p GB & Hyst, & mild fatty liver dis... ~  Colonoscopy 7/13 by DrPatterson showed severe divertic throughout colon, Rec- hi fiber. ~  1/15: sher had f/u DrPatterson> abd pain, gas, bloating; felt to be c/w chr IBS, rec probiotic & f/u DrDBrodie...   OSTEOARTHRITIS (ICD-715.90) - sees DrDaldorf... hx bilat shoulder problems (impingement & chr instability) w/ shots & PT- see his notes. 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 she takes [facility] 800mg TidPrn, CELEBREX (ave 1 tab Qod), and Tylenol...  ~  she saw DrRowe yrs ago and had Rheum re-evaluation by Rober Minion 6/09- they discussed Lyrica/ Topamax/ Flexeril therapy (also rec Align for her IBS, Urology referral for her IC, supplements for her DJD, & lab work up for her fatigue)... ~  now she says DrLewitt is treating her FM> she goes to [facility] for massage & water exercises.  VITAMIN D DEFICIENCY (ICD-268.9) - on Vit D 50000 u weekly... ~  labs 6/09 showed Vit D level = 22... start 50K weekly... ~  labs 11/09 showed Vit D level = 51... asked to switch to OTC Vit D supplement but she continued the perscription. ~  Labs 6/12 showed Vit D level = 61... She prefers the 50K weekly Rx... ~  Labs 6/13 showed Vit D level = 52  MIGRAINE HEADACHE (ICD-346.90) - prev used Midrin, plus the above meds and is followed by DrLewitt... also saw him for dizziness and Dx w/ benign positional vertigo Rx w/ exercises... ~  DrLewitt doesn't want the use of Midrin due to vasoconstrictor properties...  ANXIETY (ICD-300.00) - on KLONOPIN 0.5mg  Bid...  ACNE ROSACEA (ICD-695.3)  Health Maintenance - GYN= DrARoss (on Premarin) & he does PAP smears, Mammograms, & BMD's on her...   Past Surgical History  Procedure Laterality Date  . Cholecystectomy  1985  . Vaginal hysterectomy  1993  . Cesarean section    . Foot osteotomy  2006    Right foot  . Tubal ligation  1983  . Bunionectomy  09/04/2011    left    Outpatient Encounter Prescriptions as of 06/30/2013  Medication Sig  . AMBULATORY NON FORMULARY MEDICATION Lidocaine HCL Isotonic (45ml) 4% Nasal Spray As needed  . aspirin 81 MG tablet Take 81 mg by mouth daily.    . Carboxymethylcellul-Glycerin (OPTIVE) 0.5-0.9 % SOLN Apply to eye as needed.    . celecoxib (CELEBREX)  200 MG capsule TAKE 1 CAPSULE BY MOUTH EVERY DAY  . chlorzoxazone (PARAFON) 500 MG tablet Take 500 mg by mouth 4 (four) times daily as needed for muscle spasms.  . clidinium-chlordiazePOXIDE (LIBRAX) 2.5-5 MG per capsule Take 1 capsule by mouth 3 (three) times daily as needed.  . clonazePAM (KLONOPIN) 0.5 MG tablet 1 po bid as needed  . colestipol (COLESTID) 1 G tablet Take 1 g by mouth daily.  Marland Kitchen esomeprazole (NEXIUM) 40 MG capsule Take 1 capsule (40 mg total) by mouth 2 (two) times daily.  . hydrochlorothiazide (HYDRODIURIL) 25 MG tablet TAKE 1/2  TABLET (25 MG TOTAL) BY MOUTH DAILY.  Marland Kitchen HYDROcodone-acetaminophen (NORCO/VICODIN) 5-325 MG per tablet Take 1-2 tablets by mouth every 4 (four) hours as needed.   . hydrocortisone (ANUSOL-HC) 25 MG suppository PLACE 1 SUPPOSITORY (25 MG TOTAL) RECTALLY AT BEDTIME AS NEEDED FOR HEMORRHOIDS OR ITCHING.  . latanoprost (XALATAN) 0.005 % ophthalmic solution Place 1 drop into both eyes at  bedtime.  Marland Kitchen levothyroxine (SYNTHROID, LEVOTHROID) 50 MCG tablet Take 1 tablet (50 mcg total) by mouth daily before breakfast.  . metoprolol tartrate (LOPRESSOR) 50 MG tablet Take 1 tablet (50 mg total) by mouth 2 (two) times daily.  Marland Kitchen PARoxetine Mesylate (BRISDELLE) 7.5 MG CAPS Take 1 tablet by mouth daily.  . potassium chloride SA (K-DUR,KLOR-CON) 20 MEQ tablet TAKE 1 TABLET (20 MEQ TOTAL) BY MOUTH 2 (TWO) TIMES DAILY.  . Probiotic Product (VSL#3) CAPS Take 1 capsule by mouth daily.  . Sulfacetamide Sodium (OVACE PLUS EX) Apply topically as needed.  . traMADol (ULTRAM) 50 MG tablet Take 50 mg by mouth as needed.   . Triamcinolone Acetonide (NASACORT AQ NA) Place into the nose as needed.   . Vitamin D, Ergocalciferol, (DRISDOL) 50000 UNITS CAPS capsule TAKE ONE TABLET BY MOUTH ONCE A WEEK  . [DISCONTINUED] hydrochlorothiazide (HYDRODIURIL) 25 MG tablet TAKE 1 TABLET (25 MG TOTAL) BY MOUTH DAILY.  . [DISCONTINUED] levofloxacin (LEVAQUIN) 500 MG tablet Take 1 tablet (500 mg  total) by mouth daily.  . [DISCONTINUED] predniSONE (STERAPRED UNI-PAK) 5 MG TABS tablet Take as directed.    Allergies  Allergen Reactions  . Tetracycline Other (See Comments)    GI bleed  . Codeine Nausea And Vomiting    GI upset/vomit    Current Medications, Allergies, Past Medical History, Past Surgical History, Family History, and Social History were reviewed in [employer] record.    Review of Systems        See HPI - all other systems neg except as noted... The patient complains of decreased hearing, chest pain, dyspnea on exertion, headaches, abdominal pain, severe indigestion/heartburn, muscle weakness, and difficulty walking.  The patient denies anorexia, fever, weight loss, weight gain, vision loss, hoarseness, syncope, peripheral edema, prolonged cough, hemoptysis, melena, hematochezia, hematuria, incontinence, suspicious skin lesions, transient blindness, depression, unusual weight change, abnormal bleeding, enlarged lymph nodes, and angioedema.   Objective:   Physical Exam   WD, WN, 67 y/o BF in NAD...  Vital Signs:  Reviewed... GENERAL:  Alert & oriented; pleasant & cooperative... HEENT:  [place]/AT, EOM-full, PERRLA, EACs-clear, TMs-wnl, NOSE- rosacea rash,  THROAT-clear & wnl. NECK:  Supple w/ fair ROM; no JVD; sl decr carotid impulses w/ bilat L>R bruits ; no thyromegaly or nodules palpated; no lymphadenopathy. CHEST:  Clear to P & A; without wheezes/ rales/ or rhonchi heard... +trigger points. HEART:  Regular Rhythm; without murmurs/ rubs/ or gallops detected... ABDOMEN:  Soft & nontender; normal bowel sounds; no organomegaly or masses detected. EXT: without deformities or arthritic changes; no varicose veins/ venous insuffic/ or edema. NEURO:  no focal neuro deficits... DERM:  without lesions seen x acne.  RADIOLOGY DATA:  Reviewed in the EPIC EMR & discussed w/ the patient...  LABORATORY DATA:  Reviewed in the EPIC EMR & discussed w/ the  patient...   Assessment & Plan:    HBP>  Controlled on meds, continue same...  ChestPain & Palpit>  Per DrCrenshaw & she can take extra Metoprolol 25mg  Prn for palpit...  Carotid Bruits & FIBROMUSC DYSPLASIA>  Followed by DrLewitt w/ MRAs& recent recheck=> seen at [facility] for 2nd opinion & they rec continued follow up...  CHOL>  She stopped her Statin due to wt gain & musc aches, FLP 6/13 within parameters on Colestid...  HYPOTHY>  Stable on Levothy50- now down to 1/2 tab Qam...  GI> HH/ GERD/ Gastroparesis/ Divertics, IBS/ Polyps> managed by DrPatterson & EGD 7/13 as above, continue Nexium; Colonoscopy as  above- rec hi fiber...  RHEUM> DJD/ FM/ etc> followed by DrDeveshwar & Daldorf as noted above...  ANXIETY>  On Klonopin 0.5mg  Bid...   Patient's Medications  New Prescriptions   No medications on file  Previous Medications   AMBULATORY NON FORMULARY MEDICATION    Lidocaine HCL Isotonic (45ml) 4% Nasal Spray As needed   ASPIRIN 81 MG TABLET    Take 81 mg by mouth daily.     CARBOXYMETHYLCELLUL-GLYCERIN (OPTIVE) 0.5-0.9 % SOLN    Apply to eye as needed.     CELECOXIB (CELEBREX) 200 MG CAPSULE    TAKE 1 CAPSULE BY MOUTH EVERY DAY   CHLORZOXAZONE (PARAFON) 500 MG TABLET    Take 500 mg by mouth 4 (four) times daily as needed for muscle spasms.   CLIDINIUM-CHLORDIAZEPOXIDE (LIBRAX) 2.5-5 MG PER CAPSULE    Take 1 capsule by mouth 3 (three) times daily as needed.   CLONAZEPAM (KLONOPIN) 0.5 MG TABLET    1 po bid as needed   COLESTIPOL (COLESTID) 1 G TABLET    Take 1 g by mouth daily.   ESOMEPRAZOLE (NEXIUM) 40 MG CAPSULE    Take 1 capsule (40 mg total) by mouth 2 (two) times daily.   HYDROCORTISONE (ANUSOL-HC) 25 MG SUPPOSITORY    PLACE 1 SUPPOSITORY (25 MG TOTAL) RECTALLY AT BEDTIME AS NEEDED FOR HEMORRHOIDS OR ITCHING.   LATANOPROST (XALATAN) 0.005 % OPHTHALMIC SOLUTION    Place 1 drop into both eyes at bedtime.   METOPROLOL TARTRATE (LOPRESSOR) 50 MG TABLET    Take 1 tablet (50 mg  total) by mouth 2 (two) times daily.   PAROXETINE MESYLATE (BRISDELLE) 7.5 MG CAPS    Take 1 tablet by mouth daily.   POTASSIUM CHLORIDE SA (K-DUR,KLOR-CON) 20 MEQ TABLET    TAKE 1 TABLET (20 MEQ TOTAL) BY MOUTH 2 (TWO) TIMES DAILY.   PROBIOTIC PRODUCT (VSL#3) CAPS    Take 1 capsule by mouth daily.   SULFACETAMIDE SODIUM (OVACE PLUS EX)    Apply topically as needed.   TRIAMCINOLONE ACETONIDE (NASACORT AQ NA)    Place into the nose as needed.    VITAMIN D, ERGOCALCIFEROL, (DRISDOL) 50000 UNITS CAPS CAPSULE    TAKE ONE TABLET BY MOUTH ONCE A WEEK  Modified Medications   Modified Medication Previous Medication   HYDROCHLOROTHIAZIDE (HYDRODIURIL) 25 MG TABLET hydrochlorothiazide (HYDRODIURIL) 25 MG tablet      TAKE 1/2  TABLET (25 MG TOTAL) BY MOUTH DAILY.    TAKE 1 TABLET (25 MG TOTAL) BY MOUTH DAILY.   HYDROCODONE-ACETAMINOPHEN (NORCO/VICODIN) 5-325 MG PER TABLET HYDROcodone-acetaminophen (NORCO/VICODIN) 5-325 MG per tablet      Take 1 tablet by mouth 3 (three) times daily as needed.    Take 1-2 tablets by mouth every 4 (four) hours as needed.    KLOR-CON M20 20 MEQ TABLET potassium chloride SA (K-DUR,KLOR-CON) 20 MEQ tablet      TAKE 1 TABLET (20 MEQ TOTAL) BY MOUTH 2 (TWO) TIMES DAILY.    TAKE 1 TABLET (20 MEQ TOTAL) BY MOUTH 2 (TWO) TIMES DAILY.   LEVOTHYROXINE (SYNTHROID, LEVOTHROID) 50 MCG TABLET levothyroxine (SYNTHROID, LEVOTHROID) 50 MCG tablet      Take 1/2 tablet by mouth daily    Take 1 tablet (50 mcg total) by mouth daily before breakfast.   TRAMADOL (ULTRAM) 50 MG TABLET traMADol (ULTRAM) 50 MG tablet      Take 1 tablet (50 mg total) by mouth 3 (three) times daily as needed (arthritis pain).    Take 50  mg by mouth as needed.   Discontinued Medications   LEVOFLOXACIN (LEVAQUIN) 500 MG TABLET    Take 1 tablet (500 mg total) by mouth daily.   PREDNISONE (STERAPRED UNI-PAK) 5 MG TABS TABLET    Take as directed.

## 2013-06-30 NOTE — Patient Instructions (Signed)
Today we updated your med list in our EPIC system...    Continue your current medications the same...  For the cough>>    Try the OTC DELSYM- 2 tsp twice daily...    If needed you may use your Hydrocodone &/or Tramadol tabs...  Today we did your follow up Thyroid blood test & CXR...    We will contact you w/ the results when available...   Call for any questions or if we may be of assistance in any way.Marland Kitchen..Marland Kitchen

## 2013-07-04 ENCOUNTER — Other Ambulatory Visit: Payer: Self-pay | Admitting: Pulmonary Disease

## 2013-07-04 DIAGNOSIS — E039 Hypothyroidism, unspecified: Secondary | ICD-10-CM

## 2013-07-04 MED ORDER — LEVOTHYROXINE SODIUM 50 MCG PO TABS: ORAL_TABLET | ORAL | Status: DC

## 2013-07-22 ENCOUNTER — Other Ambulatory Visit (HOSPITAL_COMMUNITY): Payer: Medicare PPO

## 2013-07-26 ENCOUNTER — Other Ambulatory Visit: Payer: Self-pay | Admitting: Pulmonary Disease

## 2013-08-05 ENCOUNTER — Other Ambulatory Visit (HOSPITAL_COMMUNITY): Payer: Medicare PPO

## 2013-08-05 ENCOUNTER — Ambulatory Visit (HOSPITAL_BASED_OUTPATIENT_CLINIC_OR_DEPARTMENT_OTHER): Payer: Medicare PPO

## 2013-08-05 ENCOUNTER — Other Ambulatory Visit (HOSPITAL_COMMUNITY): Payer: Self-pay | Admitting: Radiology

## 2013-08-05 ENCOUNTER — Ambulatory Visit (HOSPITAL_COMMUNITY): Payer: Medicare PPO | Attending: Cardiology | Admitting: Cardiology

## 2013-08-05 DIAGNOSIS — R0989 Other specified symptoms and signs involving the circulatory and respiratory systems: Secondary | ICD-10-CM | POA: Insufficient documentation

## 2013-08-05 DIAGNOSIS — R0600 Dyspnea, unspecified: Secondary | ICD-10-CM

## 2013-08-05 DIAGNOSIS — R0789 Other chest pain: Secondary | ICD-10-CM

## 2013-08-05 DIAGNOSIS — R002 Palpitations: Secondary | ICD-10-CM

## 2013-08-05 DIAGNOSIS — R079 Chest pain, unspecified: Secondary | ICD-10-CM | POA: Insufficient documentation

## 2013-08-05 DIAGNOSIS — R0609 Other forms of dyspnea: Secondary | ICD-10-CM | POA: Insufficient documentation

## 2013-08-05 NOTE — Progress Notes (Signed)
Stress echo performed. 

## 2013-08-31 ENCOUNTER — Other Ambulatory Visit (INDEPENDENT_AMBULATORY_CARE_PROVIDER_SITE_OTHER): Payer: Medicare PPO

## 2013-08-31 DIAGNOSIS — E039 Hypothyroidism, unspecified: Secondary | ICD-10-CM

## 2013-08-31 LAB — TSH: TSH: 1.32 u[IU]/mL (ref 0.35–4.50)

## 2013-09-07 ENCOUNTER — Other Ambulatory Visit: Payer: Self-pay | Admitting: Gastroenterology

## 2013-09-07 NOTE — Telephone Encounter (Signed)
Patient was told to follow up with you. Patient last saw Dr. Jarold Motto 04-29-2013. Patient requested refill on Colestipol. Is it okay to refill?

## 2013-09-26 ENCOUNTER — Telehealth: Payer: Self-pay | Admitting: Pulmonary Disease

## 2013-09-26 ENCOUNTER — Telehealth: Payer: Self-pay | Admitting: Gastroenterology

## 2013-09-26 MED ORDER — PREDNISONE (PAK) 5 MG PO TABS: 5.0000 mg | ORAL_TABLET | ORAL | Status: DC

## 2013-09-26 MED ORDER — ESOMEPRAZOLE MAGNESIUM 40 MG PO CPDR: 40.0000 mg | DELAYED_RELEASE_CAPSULE | Freq: Two times a day (BID) | ORAL | Status: DC

## 2013-09-26 NOTE — Telephone Encounter (Signed)
Called patient. Patient was suppose to follow up in one month, last seen on 04-2013. Patient has follow up with Amy end of this month. Per Willette ClusterPaula Guenther, PA-C okay to refill Nexium until appointment.

## 2013-09-26 NOTE — Telephone Encounter (Signed)
Per SN---  mucinex 60 mg  2 po bid Increase fluids Increase nasal saline spray every 1-2 hours otc antihistamine allegra 180 mg  1 daily Prednisone dosepak  5 mg  6 day pack Yes she can still see SN for any pulmonary issues.

## 2013-09-26 NOTE — Telephone Encounter (Signed)
Spoke with the pt and notified of recs per SN  Pt verbalized understanding  Rx was sent to pharm  Nothing further needed

## 2013-09-26 NOTE — Telephone Encounter (Signed)
Spoke with pt C/o incr nasal congestion, head congestion, ear pounding and tinnitus, some dizziness at times and SOB x 3 days Denies cough, wheezing, no mucus/drainage Using saline nasal spray AM/PM  Allergies  Allergen Reactions  . Tetracycline Other (See Comments)    GI bleed  . Codeine Nausea And Vomiting    GI upset/vomit   [facility] [address]  Please advise Dr Kriste BasqueNadel. Thanks.

## 2013-10-11 ENCOUNTER — Encounter: Payer: Self-pay | Admitting: Physician Assistant

## 2013-10-11 ENCOUNTER — Ambulatory Visit (INDEPENDENT_AMBULATORY_CARE_PROVIDER_SITE_OTHER): Payer: Medicare PPO | Admitting: Physician Assistant

## 2013-10-11 VITALS — BP 114/72 | HR 72 | Ht 61.0 in | Wt 163.4 lb

## 2013-10-11 DIAGNOSIS — K219 Gastro-esophageal reflux disease without esophagitis: Secondary | ICD-10-CM

## 2013-10-11 DIAGNOSIS — K589 Irritable bowel syndrome without diarrhea: Secondary | ICD-10-CM

## 2013-10-11 MED ORDER — COLESTIPOL HCL 1 G PO TABS: 1.0000 g | ORAL_TABLET | Freq: Every day | ORAL | Status: DC

## 2013-10-11 MED ORDER — VSL#3 PO CAPS: 1.0000 | ORAL_CAPSULE | Freq: Every day | ORAL | Status: DC

## 2013-10-11 MED ORDER — HYOSCYAMINE SULFATE 0.125 MG SL SUBL: 0.1250 mg | SUBLINGUAL_TABLET | SUBLINGUAL | Status: DC | PRN

## 2013-10-11 MED ORDER — RIFAXIMIN 550 MG PO TABS: 550.0000 mg | ORAL_TABLET | Freq: Two times a day (BID) | ORAL | Status: DC

## 2013-10-11 MED ORDER — ESOMEPRAZOLE MAGNESIUM 40 MG PO CPDR: 40.0000 mg | DELAYED_RELEASE_CAPSULE | Freq: Every day | ORAL | Status: DC

## 2013-10-11 NOTE — Progress Notes (Signed)
Subjective:    Patient ID: Angela [name], female    DOB: 01-14-1946, 68 y.o.   MRN: [id number]  HPI  Angela [name] is a pleasant 68 year old female former patient of Angela [name]'s who comes back in today to establish and obtain refills. She has history of IBS and bacterial overgrowth ,bile salt induced diarrhea a,nd also has been followed for GERD. Other medical problems include hypertension, hyperlipidemia, diffuse diverticular disease and fibromyalgia. She has fibromuscular hyperplasia of the heart. Last colonoscopy was done in July of 2013 and showed severe diffuse diverticulosis. She has been managed with colestipol daily Xifaxan periodically for bacterial overgrowth and VSL#3She is generally well controlled on Nexium for GERD though prefers to take it twice per day for better control. She says she's been doing well recently she has occasional episodes of urgency and diarrhea and has had very sporadic episode of incontinence. She said she had an episode about 3 weeks ago and has not had any problems since. Most of the time her bowel movements are normal. She's not having any particular problems with abdominal bloating or gas/discomfort today. No complaints of dysphagia or odynophagia.    Review of Systems  Constitutional: Negative.   HENT: Negative.   Eyes: Negative.   Respiratory: Negative.   Cardiovascular: Negative.   Gastrointestinal: Positive for diarrhea.  Endocrine: Negative.   Genitourinary: Negative.   Musculoskeletal: Negative.   Allergic/Immunologic: Negative.   Neurological: Negative.   Hematological: Negative.   Psychiatric/Behavioral: Negative.    Outpatient Prescriptions Prior to Visit  Medication Sig Dispense Refill  . AMBULATORY NON FORMULARY MEDICATION Lidocaine HCL Isotonic (45ml) 4% Nasal Spray As needed      . aspirin 81 MG tablet Take 81 mg by mouth daily.        . Carboxymethylcellul-Glycerin (OPTIVE) 0.5-0.9 % SOLN Apply to eye as needed.        . celecoxib  (CELEBREX) 200 MG capsule TAKE 1 CAPSULE BY MOUTH EVERY DAY  30 capsule  5  . chlorzoxazone (PARAFON) 500 MG tablet Take 500 mg by mouth 4 (four) times daily as needed for muscle spasms.      . clonazePAM (KLONOPIN) 0.5 MG tablet 1 po bid as needed  60 tablet  0  . hydrochlorothiazide (HYDRODIURIL) 25 MG tablet TAKE 1/2  TABLET (25 MG TOTAL) BY MOUTH DAILY.      Marland Kitchen. HYDROcodone-acetaminophen (NORCO/VICODIN) 5-325 MG per tablet Take 1 tablet by mouth 3 (three) times daily as needed.  90 tablet  0  . hydrocortisone (ANUSOL-HC) 25 MG suppository PLACE 1 SUPPOSITORY (25 MG TOTAL) RECTALLY AT BEDTIME AS NEEDED FOR HEMORRHOIDS OR ITCHING.  14 suppository  0  . KLOR-CON M20 20 MEQ tablet TAKE 1 TABLET (20 MEQ TOTAL) BY MOUTH 2 (TWO) TIMES DAILY.  60 tablet  6  . latanoprost (XALATAN) 0.005 % ophthalmic solution Place 1 drop into both eyes at bedtime.      Marland Kitchen. levothyroxine (SYNTHROID, LEVOTHROID) 50 MCG tablet Take 1/2 tablet by mouth daily  30 tablet  6  . metoprolol tartrate (LOPRESSOR) 50 MG tablet Take 1 tablet (50 mg total) by mouth 2 (two) times daily.  60 tablet  11  . PARoxetine Mesylate (BRISDELLE) 7.5 MG CAPS Take 1 tablet by mouth daily.      . Sulfacetamide Sodium (OVACE PLUS EX) Apply topically as needed.      . traMADol (ULTRAM) 50 MG tablet Take 1 tablet (50 mg total) by mouth 3 (three) times daily as needed (arthritis pain).  90 tablet  1  . Triamcinolone Acetonide (NASACORT AQ NA) Place into the nose as needed.       . Vitamin D, Ergocalciferol, (DRISDOL) 50000 UNITS CAPS capsule TAKE ONE TABLET BY MOUTH ONCE A WEEK  4 capsule  6  . esomeprazole (NEXIUM) 40 MG capsule Take 1 capsule (40 mg total) by mouth 2 (two) times daily.  60 capsule  0  . MICRONIZED COLESTIPOL HCL 1 G tablet TAKE 1 TABLET (1 G TOTAL) BY MOUTH DAILY.  60 tablet  2  . Probiotic Product (VSL#3) CAPS Take 1 capsule by mouth daily.  30 capsule  3  . clidinium-chlordiazePOXIDE (LIBRAX) 2.5-5 MG per capsule Take 1 capsule by  mouth 3 (three) times daily as needed.  60 capsule  3  . colestipol (COLESTID) 1 G tablet Take 1 g by mouth daily.      . potassium chloride SA (K-DUR,KLOR-CON) 20 MEQ tablet TAKE 1 TABLET (20 MEQ TOTAL) BY MOUTH 2 (TWO) TIMES DAILY.  60 tablet  8  . predniSONE (STERAPRED UNI-PAK) 5 MG TABS tablet Take 1 tablet (5 mg total) by mouth as directed.  1 each  0   No facility-administered medications prior to visit.   Allergies  Allergen Reactions  . Tetracycline Other (See Comments)    GI bleed  . Codeine Nausea And Vomiting    GI upset/vomit     Patient Active Problem List   Diagnosis Date Noted  . Internal hemorrhoids 02/15/2013  . Fibromuscular hyperplasia of artery   . Migraine headache 09/24/2011  . Anxiety and depression 11/14/2010  . Bile salt-induced diarrhea 11/14/2010  . Gastroparesis 11/14/2010  . GERD (gastroesophageal reflux disease) 08/16/2010  . Personal history of colonic polyps 06/25/2010  . OTHER SPECIFIED DISORDERS OF ARTERIES&ARTERIOLES 12/28/2009  . HYPERLIPIDEMIA 12/20/2009  . PALPITATIONS 12/19/2009  . ATOPIC RHINITIS 08/24/2009  . HYPOTHYROIDISM 07/20/2009  . VITAMIN D DEFICIENCY 07/20/2009  . SINUSITIS, ACUTE 12/11/2008  . FREQUENCY, URINARY 08/14/2008  . EXCESSIVE BELCHING 04/06/2008  . HYPERTENSION 05/14/2007  . ACNE ROSACEA 05/14/2007  . OSTEOARTHRITIS 05/14/2007  . CAROTID BRUIT 05/14/2007  . CHEST PAIN, ATYPICAL 05/14/2007  . ANXIETY 03/30/2007  . DIVERTICULAR DISEASE 03/30/2007  . FIBROMYALGIA 03/30/2007   History  Substance Use Topics  . Smoking status: Never Smoker   . Smokeless tobacco: Never Used  . Alcohol Use: No   family history includes Colon cancer in her maternal aunt; Colon polyps in her father and sister; Diabetes in her sister; Hypertension in her father and sister; Mitral valve prolapse in her mother; Stomach cancer in her maternal aunt; Stroke in her father.     Objective:   Physical Exam well-developed older African  American female in no acute distress, pleasant blood pressure 114/72 pulse 72 height 5 foot 1 weight 163. HEENT; nontraumatic normocephalic EOMI PERRLA sclera anicteric, Supple ;no JVD, Cardiovascular ;regular rate and rhythm with S1-S2 no murmur or gallop, Pulm;clear bilaterally, Abdomen ;soft basically nontender there is no palpable mass or hepatosplenomegaly bowel sounds are present, cholecystectomy scar Rectal; exam not done, Extremities; no clubbing cyanosis or edema skin warm and dry, Psych ;mood and affect normal and appropriate        Assessment & Plan:  #81  68 year old female with IBS, bile salt induced diarrhea and history of small bowel bacterial overgrowth #2 diffuse diverticulosis #3 GERD #4 hypothyroidism #5 hyperlipidemia #6 fibromyalgia #7 fibromuscular hyperplasia  Plan; patient will be established with Dr. Rob Buntinganiel Jacobs, explained to the patient that I would  be happy to see her in followup as well. She is up-to-date with colon  Screening Will refill Nexium 40 mg by mouth twice daily Refill Xifaxan 550 twice daily x7 days-she will keep this on hold for recurrent bloating distention and gas consistent with bacterial overgrowth Refill Colestid 1 g daily Refill VSL3-1 packet daily Levsin sublingual when necessary for spasms/urgency Return office visit in one year with Dr. Christella Hartigan or myself or sooner when necessary

## 2013-10-11 NOTE — Progress Notes (Signed)
i agree with the plan above 

## 2013-10-11 NOTE — Patient Instructions (Addendum)
You are established here now as Dr. Rob Buntinganiel Jacobs. We sent refills to [facility] [address] 1. Nexium 40 mg 2. Levsin SL  3. VSL3 probiotic 4. Xifaxan 500 mg 5. micronozed cholestipol.

## 2013-10-17 ENCOUNTER — Telehealth: Payer: Self-pay | Admitting: *Deleted

## 2013-10-17 NOTE — Telephone Encounter (Signed)
I called the patient to advise that I called her insurance company and did a prior authorization. I was told it will take 2-4 days to get a response. I advised I will call her once I get an answer on the prior authorization for the Xifaxan 500 mg.

## 2013-10-18 NOTE — Telephone Encounter (Signed)
I called the patient to advise we got approval for the Xifaxan 550 mg, the cost is $95.00.  I also told her the Levsin SL cost is $1. 15 PM.  I told her to let the pharmacist know if she cannot pick up the Xifaxan. She thinks she can manage it since the Levsin SL is only $1.15.  She thanked me for working on this for her with [employer] insurance company. I also spoke to [employer]ster[employer] PA-C to advise [facility]  For 10 days twice daily.  Amy's note said 7 days.  Amy said the 10 day prescription would be fine.

## 2013-11-08 ENCOUNTER — Telehealth: Payer: Self-pay | Admitting: Physician Assistant

## 2013-11-08 MED ORDER — ALIGN PO CAPS: 1.0000 | ORAL_CAPSULE | Freq: Every day | ORAL | Status: DC

## 2013-11-08 NOTE — Telephone Encounter (Signed)
I called the patient and asked her what the insurance company said.  She said that they will not cover the VSL#3 now.  I asked her what she had to pay when they did cover it and she wasn't sure.  I told her we can send another prescription for Align of [place] and she can decide once the pharmacy tells her the price if she wants it.  I told her I will send Align.  She said if she cannot afford it, she will get something over the counter that is cheaper. I also told her that Dr. Larae GroomsJacob;s CMA told me that he recommends sometimes for people to take the Activia yogart that has probiotics in it.  She said she just started eating that and likes it.  She thanked me for the information.

## 2013-11-09 ENCOUNTER — Other Ambulatory Visit: Payer: Self-pay | Admitting: Gastroenterology

## 2013-11-09 NOTE — Telephone Encounter (Signed)
Amy refilled this last

## 2013-11-26 ENCOUNTER — Other Ambulatory Visit: Payer: Self-pay | Admitting: Nurse Practitioner

## 2013-12-03 ENCOUNTER — Other Ambulatory Visit: Payer: Self-pay | Admitting: Nurse Practitioner

## 2013-12-21 ENCOUNTER — Telehealth: Payer: Self-pay | Admitting: Gastroenterology

## 2013-12-21 NOTE — Telephone Encounter (Signed)
Dr. Christella Hartigan,  Patient had an office visit with Amy on 09-2013, patient is due to follow up with you in one year Is it okay to refill Nexium?

## 2013-12-21 NOTE — Telephone Encounter (Signed)
It looks like Angela [name] refilled the nexium in June 60pills, 11 refills.  She shouldn't need another script written but If there's been a problem I'm happy to .

## 2013-12-21 NOTE — Telephone Encounter (Signed)
[facility] with [facility]al Hymen. Per [facility] RX was [facility] in June by Elita Quick for 60 day supply instead of 30 day supply. Per office note from Amy, patient should take Nexium twice daily. Pharmacy said prescription will be covered for once daily by insurance but not twice daily.  I called patient back. Advised patient of what pharmacy said and last office visit note from Amy. Patient stated that she was told to take Nexium twice daily but she has been doing once daily, no symptoms.  I called pharmacy back advised okay to do 30 day supply  Patient notified RX ready

## 2014-01-31 ENCOUNTER — Other Ambulatory Visit: Payer: Self-pay | Admitting: Pulmonary Disease

## 2014-02-09 ENCOUNTER — Telehealth: Payer: Self-pay | Admitting: Physician Assistant

## 2014-02-09 NOTE — Telephone Encounter (Signed)
This is a duplicated message.

## 2014-02-09 NOTE — Telephone Encounter (Signed)
I have left message for the patient to call back 

## 2014-02-09 NOTE — Telephone Encounter (Signed)
Spoke with the patient who has complaints of increased gas, bloating and "rumblings". She has felt a "spasm" under her right breast that is causing belching and discomfort.  It is occurs with all meals, but symptoms seem to be worse with fried foods. She can get a little temporary relief with Mylanta. Med meds are Nexium and a probiotic. She also eats Activia.

## 2014-02-22 ENCOUNTER — Other Ambulatory Visit: Payer: Self-pay

## 2014-02-22 MED ORDER — CILIDINIUM-CHLORDIAZEPOXIDE 2.5-5 MG PO CAPS: 1.0000 | ORAL_CAPSULE | Freq: Three times a day (TID) | ORAL | Status: DC

## 2014-02-22 MED ORDER — RIFAXIMIN 550 MG PO TABS: 550.0000 mg | ORAL_TABLET | Freq: Two times a day (BID) | ORAL | Status: DC

## 2014-02-22 NOTE — Telephone Encounter (Signed)
Make sure she is taking librax as needed- and can give her a course of xifaxan 550 mg twice daily x 10 days which she has taken before for bacterial overgrowth/IBS- see if that helps

## 2014-02-22 NOTE — Telephone Encounter (Signed)
She does not have Librax. She would like to take the Xifaxan. May I have a prescription for Librax. It was last filled in 2014.

## 2014-02-22 NOTE — Telephone Encounter (Signed)
Sure, can take Librax  One  3 x daily as needed for bloating spasm #90/3 refills

## 2014-02-23 ENCOUNTER — Telehealth: Payer: Self-pay | Admitting: *Deleted

## 2014-02-23 NOTE — Telephone Encounter (Signed)
I called the patient to advise we got a prior authorization form from the pharmacy.  I told her I faxed her insurance information to [facility] today. They will contact [employer] and let her know if it was approved and how much if anything she will have to pay.  They will also mail the Xifaxan to her home.

## 2014-02-27 ENCOUNTER — Telehealth: Payer: Self-pay | Admitting: *Deleted

## 2014-02-27 NOTE — Telephone Encounter (Signed)
[facility] a [facility] from [facility] the prior authoirzation for Xifaxan 550 mg.  He asked if this medication was for IBS/D and I replied yes.  He also asked if she tried dicylomine. I told him she tried it in the past but this isn't helping at the present time.  He said they would contact the patient.  Not sure if this was approved.  I am calling [facility] to verify if medication was approved.

## 2014-02-27 NOTE — Telephone Encounter (Signed)
I called [facility] and they also spoke with [employer], Amy T

## 2014-03-06 ENCOUNTER — Other Ambulatory Visit: Payer: Self-pay | Admitting: Pulmonary Disease

## 2014-03-06 NOTE — Telephone Encounter (Signed)
11/23  Pt called with update that Rx had be approved, but the copay was going to be 90$. Pt would like a call back [phone]

## 2014-03-13 NOTE — Telephone Encounter (Signed)
Patient states that she is going to try to get the medication but will call back if she has problems.

## 2014-03-14 ENCOUNTER — Other Ambulatory Visit: Payer: Self-pay | Admitting: Pulmonary Disease

## 2014-03-16 ENCOUNTER — Telehealth: Payer: Self-pay | Admitting: Pulmonary Disease

## 2014-03-16 NOTE — Telephone Encounter (Signed)
Called and spoke with pt and she will call primary care to see if she can get set up with new PCP.  Pt is aware that her rx has been sent to the pharmacy.  Nothing further is needed.

## 2014-03-23 ENCOUNTER — Telehealth: Payer: Self-pay | Admitting: Pulmonary Disease

## 2014-03-23 NOTE — Telephone Encounter (Signed)
Called and spoke with pt and she stated that she is inbetween doctors and has appt with Dr. Dorise HissKollar in feb 2016.  She stated that she needs to get her flu vaccine but also needs her pna vaccine.  She was asking about the shingles vaccine as well--should she get this?  SN please advise thanks.    Allergies  Allergen Reactions  . Tetracycline Other (See Comments)    GI bleed  . Codeine Nausea And Vomiting    GI upset/vomit    Current Outpatient Prescriptions on File Prior to Visit  Medication Sig Dispense Refill  . AMBULATORY NON FORMULARY MEDICATION Lidocaine HCL Isotonic (45ml) 4% Nasal Spray As needed    . aspirin 81 MG tablet Take 81 mg by mouth daily.      . bifidobacterium infantis (ALIGN) capsule Take 1 capsule by mouth daily. 30 capsule 1  . Carboxymethylcellul-Glycerin (OPTIVE) 0.5-0.9 % SOLN Apply to eye as needed.      . celecoxib (CELEBREX) 200 MG capsule TAKE 1 CAPSULE BY MOUTH EVERY DAY 30 capsule 5  . celecoxib (CELEBREX) 200 MG capsule TAKE 1 CAPSULE BY MOUTH EVERY DAY 30 capsule 5  . chlorzoxazone (PARAFON) 500 MG tablet Take 500 mg by mouth 4 (four) times daily as needed for muscle spasms.    . clidinium-chlordiazePOXIDE (LIBRAX) 5-2.5 MG per capsule Take 1 capsule by mouth 3 (three) times daily before meals. 90 capsule 3  . clonazePAM (KLONOPIN) 0.5 MG tablet 1 po bid as needed 60 tablet 0  . colestipol (MICRONIZED COLESTIPOL HCL) 1 G tablet Take 1 tablet (1 g total) by mouth daily. 30 tablet 11  . esomeprazole (NEXIUM) 40 MG capsule Take 1 capsule (40 mg total) by mouth daily at 12 noon. 60 capsule 11  . hydrochlorothiazide (HYDRODIURIL) 25 MG tablet TAKE 1/2  TABLET (25 MG TOTAL) BY MOUTH DAILY.    Marland Kitchen. HYDROcodone-acetaminophen (NORCO/VICODIN) 5-325 MG per tablet Take 1 tablet by mouth 3 (three) times daily as needed. 90 tablet 0  . hydrocortisone (ANUSOL-HC) 25 MG suppository PLACE 1 SUPPOSITORY (25 MG TOTAL) RECTALLY AT BEDTIME AS NEEDED FOR HEMORRHOIDS OR ITCHING. 14  suppository 0  . hyoscyamine (LEVSIN/SL) 0.125 MG SL tablet Place 1 tablet (0.125 mg total) under the tongue every 4 (four) hours as needed. 30 tablet 0  . KLOR-CON M20 20 MEQ tablet TAKE 1 TABLET (20 MEQ TOTAL) BY MOUTH 2 (TWO) TIMES DAILY. 60 tablet 6  . latanoprost (XALATAN) 0.005 % ophthalmic solution Place 1 drop into both eyes at bedtime.    Marland Kitchen. levothyroxine (SYNTHROID, LEVOTHROID) 50 MCG tablet Take 1/2 tablet by mouth daily 30 tablet 6  . metoprolol tartrate (LOPRESSOR) 50 MG tablet Take 1 tablet (50 mg total) by mouth 2 (two) times daily. 60 tablet 11  . MICRONIZED COLESTIPOL HCL 1 G tablet TAKE 1 TABLET (1 G TOTAL) BY MOUTH DAILY. 60 tablet 2  . NEXIUM 40 MG capsule TAKE 1 CAPSULE (40 MG TOTAL) BY MOUTH 2 (TWO) TIMES DAILY. 60 capsule 0  . PARoxetine Mesylate (BRISDELLE) 7.5 MG CAPS Take 1 tablet by mouth daily.    . Probiotic Product (VSL#3) CAPS Take 1 capsule by mouth daily. 30 capsule 11  . rifaximin (XIFAXAN) 550 MG TABS tablet Take 1 tablet (550 mg total) by mouth 2 (two) times daily. 20 tablet 0  . Sulfacetamide Sodium (OVACE PLUS EX) Apply topically as needed.    . traMADol (ULTRAM) 50 MG tablet Take 1 tablet (50 mg total) by mouth 3 (  three) times daily as needed (arthritis pain). 90 tablet 1  . Triamcinolone Acetonide (NASACORT AQ NA) Place into the nose as needed.     . Vitamin D, Ergocalciferol, (DRISDOL) 50000 UNITS CAPS capsule TAKE ONE TABLET BY MOUTH ONCE A WEEK 4 capsule 6   No current facility-administered medications on file prior to visit.

## 2014-03-24 NOTE — Telephone Encounter (Signed)
Per SN--  Ok for the flu shot Will need the pneumovax 23 and we will do the prevnar 13 at a later date. Ok to write for the shingles vaccine when she comes in for these vaccines.  Pt stated that she will come in on Wednesday of next week.  Will hold this message until then.

## 2014-03-29 ENCOUNTER — Ambulatory Visit (INDEPENDENT_AMBULATORY_CARE_PROVIDER_SITE_OTHER): Payer: Medicare PPO

## 2014-03-29 ENCOUNTER — Ambulatory Visit: Payer: Medicare PPO

## 2014-03-29 DIAGNOSIS — Z23 Encounter for immunization: Secondary | ICD-10-CM

## 2014-03-29 NOTE — Telephone Encounter (Signed)
Pt came in today for the flu vaccine and the pna vaccine. Pt is aware that she can get the prevnar next year.  She was given the rx for the shingles vaccine and is aware to wait at least 3-4 weeks to get this at the pharmacy.

## 2014-04-29 ENCOUNTER — Other Ambulatory Visit: Payer: Self-pay | Admitting: Pulmonary Disease

## 2014-05-10 ENCOUNTER — Other Ambulatory Visit: Payer: Self-pay | Admitting: Physician Assistant

## 2014-05-11 ENCOUNTER — Other Ambulatory Visit: Payer: Self-pay | Admitting: *Deleted

## 2014-05-11 MED ORDER — METOPROLOL TARTRATE 50 MG PO TABS: 50.0000 mg | ORAL_TABLET | Freq: Two times a day (BID) | ORAL | Status: DC

## 2014-05-12 ENCOUNTER — Telehealth: Payer: Self-pay | Admitting: *Deleted

## 2014-05-12 NOTE — Telephone Encounter (Signed)
I called [employer] to do a Prior authorization for Nexium 40 mg BID.  I got a reference # [id number].  I was told we will get a faxed answer within 48 to 72 hours.  Today is Friday, we won't know anything until Monday, 05-15-2014.

## 2014-05-15 ENCOUNTER — Ambulatory Visit (INDEPENDENT_AMBULATORY_CARE_PROVIDER_SITE_OTHER): Payer: Medicare PPO | Admitting: Internal Medicine

## 2014-05-15 ENCOUNTER — Encounter: Payer: Self-pay | Admitting: Internal Medicine

## 2014-05-15 ENCOUNTER — Telehealth: Payer: Self-pay | Admitting: *Deleted

## 2014-05-15 ENCOUNTER — Other Ambulatory Visit (INDEPENDENT_AMBULATORY_CARE_PROVIDER_SITE_OTHER): Payer: Medicare PPO

## 2014-05-15 VITALS — BP 118/64 | HR 78 | Temp 98.5°F | Resp 14 | Ht 61.0 in | Wt 172.8 lb

## 2014-05-15 DIAGNOSIS — K9089 Other intestinal malabsorption: Secondary | ICD-10-CM

## 2014-05-15 DIAGNOSIS — E669 Obesity, unspecified: Secondary | ICD-10-CM

## 2014-05-15 DIAGNOSIS — K219 Gastro-esophageal reflux disease without esophagitis: Secondary | ICD-10-CM

## 2014-05-15 DIAGNOSIS — E039 Hypothyroidism, unspecified: Secondary | ICD-10-CM

## 2014-05-15 DIAGNOSIS — R200 Anesthesia of skin: Secondary | ICD-10-CM | POA: Insufficient documentation

## 2014-05-15 DIAGNOSIS — R202 Paresthesia of skin: Secondary | ICD-10-CM

## 2014-05-15 DIAGNOSIS — M797 Fibromyalgia: Secondary | ICD-10-CM

## 2014-05-15 DIAGNOSIS — K909 Intestinal malabsorption, unspecified: Secondary | ICD-10-CM

## 2014-05-15 DIAGNOSIS — I773 Arterial fibromuscular dysplasia: Secondary | ICD-10-CM

## 2014-05-15 DIAGNOSIS — I10 Essential (primary) hypertension: Secondary | ICD-10-CM

## 2014-05-15 DIAGNOSIS — L719 Rosacea, unspecified: Secondary | ICD-10-CM

## 2014-05-15 LAB — COMPREHENSIVE METABOLIC PANEL
ALBUMIN: 4.2 g/dL (ref 3.5–5.2)
ALK PHOS: 82 U/L (ref 39–117)
ALT: 28 U/L (ref 0–35)
AST: 26 U/L (ref 0–37)
BUN: 13 mg/dL (ref 6–23)
CO2: 30 mEq/L (ref 19–32)
CREATININE: 0.76 mg/dL (ref 0.40–1.20)
Calcium: 9.8 mg/dL (ref 8.4–10.5)
Chloride: 104 mEq/L (ref 96–112)
GFR: 97.39 mL/min (ref >60.00)
Glucose, Bld: 91 mg/dL (ref 70–99)
POTASSIUM: 4.2 meq/L (ref 3.5–5.1)
Sodium: 140 mEq/L (ref 135–145)
Total Bilirubin: 0.3 mg/dL (ref 0.2–1.2)
Total Protein: 7 g/dL (ref 6.0–8.3)

## 2014-05-15 LAB — LIPID PANEL
Cholesterol: 217 mg/dL — ABNORMAL HIGH (ref 0–200)
HDL: 60.1 mg/dL (ref >39.00)
LDL CALC: 124 mg/dL — AB (ref 0–99)
NONHDL: 156.9
TRIGLYCERIDES: 164 mg/dL — AB (ref 0.0–149.0)
Total CHOL/HDL Ratio: 4
VLDL: 32.8 mg/dL (ref 0.0–40.0)

## 2014-05-15 LAB — HEMOGLOBIN A1C: HEMOGLOBIN A1C: 6.3 % (ref 4.6–6.5)

## 2014-05-15 LAB — TSH: TSH: 0.91 u[IU]/mL (ref 0.35–4.50)

## 2014-05-15 NOTE — Progress Notes (Signed)
Pre visit review using our clinic review tool, if applicable. No additional management support is needed unless otherwise documented below in the visit note. 

## 2014-05-15 NOTE — Assessment & Plan Note (Signed)
She has seen dermatology for this and is doing much better for now. Continue current treatment.

## 2014-05-15 NOTE — Assessment & Plan Note (Signed)
She is still having about 3 bowel movements per day with history of accidents which does significantly limit her life. She got relief from the bloating with rifaximin but did not help with amount of bowel movements. She is not able to tolerate dairy products.

## 2014-05-15 NOTE — Telephone Encounter (Signed)
I called Angela [name] and let her know her Nexium 40 mg was approved twice daily dosing.  This is good until 05-14-2016.  [facility].

## 2014-05-15 NOTE — Assessment & Plan Note (Signed)
She is currently taking 25 mcg daily of synthroid and will check TSH today and adjust as needed. Possibility that this may be worsening her diarrhea although it does not sound likely as she has been on thyroid medicine for some time and got no relief with decrease in dosing last March.

## 2014-05-15 NOTE — Assessment & Plan Note (Signed)
She does follow every 6 months with imaging for stability.

## 2014-05-15 NOTE — Assessment & Plan Note (Signed)
Used to take HCTZ but with addition of metoprolol was sending the BP too low. Since she has stopped HCTZ will stop potassium pills. Check BMP today.

## 2014-05-15 NOTE — Progress Notes (Signed)
   Subjective:    Patient ID: Angela [name], female    DOB: January 03, 1946, 69 y.o.   MRN: [id number]  HPI The patient is a 69 YO female who is coming in today for numbness in her hands and feet. She thinks it has been going on about 6 months. It has been about the same since it started. It is present some of the day and does not limit her activitie. She is concerned about diabetes as it runs in the family and she has been exercising less and gained some weight lately. She denies associated pain, color change, rash, fevers, chills, chest pains, neck pain, back pain. She does have extensive PMH including osteoarthritis, HTN, hyperlipidemia, GERD, fibromyalgia, anxiety, rosacea. Please see the A and P for status of chronic medical problems.   PMH, PSH, [facility], social history reviewed and updated. Medications, allergies, problem list updated.   Review of Systems  Constitutional: Positive for activity change. Negative for fever, appetite change, fatigue and unexpected weight change.       Exercising less  Respiratory: Negative for cough, chest tightness, shortness of breath and wheezing.   Cardiovascular: Positive for chest pain. Negative for palpitations and leg swelling.       Aching chronic from costochondritis  Gastrointestinal: Positive for diarrhea. Negative for nausea, abdominal pain, constipation, blood in stool and abdominal distention.       Chronic  Endocrine: Negative.   Musculoskeletal: Positive for myalgias. Negative for arthralgias and gait problem.  Skin: Negative.   Neurological: Positive for numbness and headaches. Negative for dizziness, seizures, weakness and light-headedness.  Psychiatric/Behavioral: Negative.       Objective:   Physical Exam  Constitutional: She is oriented to person, place, and time. She appears well-developed and well-nourished.  HENT:  Head: Normocephalic and atraumatic.  Eyes: EOM are normal.  Neck: Normal range of motion.  Cardiovascular: Normal rate  and regular rhythm.   Pulmonary/Chest: Effort normal and breath sounds normal. No respiratory distress. She has no wheezes. She has no rales.  Abdominal: Soft. Bowel sounds are normal. She exhibits no distension. There is no tenderness. There is no rebound.  Musculoskeletal: She exhibits no edema.  Neurological: She is alert and oriented to person, place, and time. No cranial nerve deficit. Coordination normal.  Patient with sensation normal in both hands and feet, normal pulses in the feet bilaterally  Skin: Skin is warm and dry.    Filed Vitals:   05/15/14 1039  BP: 118/64  Pulse: 78  Temp: 98.5 F (36.9 C)  TempSrc: Oral  Resp: 14  Height: 5\' 1"  (1.549 m)  Weight: 172 lb 12.8 oz (78.382 kg)  SpO2: 96%      Assessment & Plan:

## 2014-05-15 NOTE — Assessment & Plan Note (Signed)
Takes nexium twice a day and sometimes still needs mylanta. Given poor level of control I have asked her to return to GI for further adjustments.

## 2014-05-15 NOTE — Telephone Encounter (Signed)
[facility] and [facility]. We did get approval for Nexium 40 mg.  Twice daily for 30 days.This will be good until 05-14-2016. Ref # [id number].

## 2014-05-15 NOTE — Patient Instructions (Signed)
We will check on the blood work today and make sure that the thyroid is okay, you do not have diabetes. We will check on the kidneys.   We will call and let you know about the results. You can stop taking the potassium pills.   We will see you back in about 6 months. If you have more problems with the numbness in your hands and feet please feel free to call us back or come back sooner.   Plantar Fasciitis (Heel Spur Syndrome) with Rehab The plantar fascia is a fibrous, ligament-like, soft-tissue structure that spans the bottom of the foot. Plantar fasciitis is a condition that causes pain in the foot due to inflammation of the tissue. SYMPTOMS   Pain and tenderness on the underneath side of the foot.  Pain that worsens with standing or walking. CAUSES  Plantar fasciitis is caused by irritation and injury to the plantar fascia on the underneath side of the foot. Common mechanisms of injury include:  Direct trauma to bottom of the foot.  Damage to a small nerve that runs under the foot where the main fascia attaches to the heel bone.  Stress placed on the plantar fascia due to bone spurs. RISK INCREASES WITH:   Activities that place stress on the plantar fascia (running, jumping, pivoting, or cutting).  Poor strength and flexibility.  Improperly fitted shoes.  Tight calf muscles.  Flat feet.  Failure to warm-up properly before activity.  Obesity. PREVENTION  Warm up and stretch properly before activity.  Allow for adequate recovery between workouts.  Maintain physical fitness:  Strength, flexibility, and endurance.  Cardiovascular fitness.  Maintain a health body weight.  Avoid stress on the plantar fascia.  Wear properly fitted shoes, including arch supports for individuals who have flat feet. PROGNOSIS  If treated properly, then the symptoms of plantar fasciitis usually resolve without surgery. However, occasionally surgery is necessary. RELATED COMPLICATIONS     Recurrent symptoms that may result in a chronic condition.  Problems of the lower back that are caused by compensating for the injury, such as limping.  Pain or weakness of the foot during push-off following surgery.  Chronic inflammation, scarring, and partial or complete fascia tear, occurring more often from repeated injections. TREATMENT  Treatment initially involves the use of ice and medication to help reduce pain and inflammation. The use of strengthening and stretching exercises may help reduce pain with activity, especially stretches of the Achilles tendon. These exercises may be performed at home or with a therapist. Your caregiver may recommend that you use heel cups of arch supports to help reduce stress on the plantar fascia. Occasionally, corticosteroid injections are given to reduce inflammation. If symptoms persist for greater than 6 months despite non-surgical (conservative), then surgery may be recommended.  MEDICATION   If pain medication is necessary, then nonsteroidal anti-inflammatory medications, such as aspirin and ibuprofen, or other minor pain relievers, such as acetaminophen, are often recommended.  Do not take pain medication within 7 days before surgery.  Prescription pain relievers may be given if deemed necessary by your caregiver. Use only as directed and only as much as you need.  Corticosteroid injections may be given by your caregiver. These injections should be reserved for the most serious cases, because they may only be given a certain number of times. HEAT AND COLD  Cold treatment (icing) relieves pain and reduces inflammation. Cold treatment should be applied for 10 to 15 minutes every 2 to 3 hours for inflammation  and pain and immediately after any activity that aggravates your symptoms. Use ice packs or massage the area with a piece of ice (ice massage).  Heat treatment may be used prior to performing the stretching and strengthening activities  prescribed by your caregiver, physical therapist, or athletic trainer. Use a heat pack or soak the injury in warm water. SEEK IMMEDIATE MEDICAL CARE IF:  Treatment seems to offer no benefit, or the condition worsens.  Any medications produce adverse side effects. EXERCISES RANGE OF MOTION (ROM) AND STRETCHING EXERCISES - Plantar Fasciitis (Heel Spur Syndrome) These exercises may help you when beginning to rehabilitate your injury. Your symptoms may resolve with or without further involvement from your physician, physical therapist or athletic trainer. While completing these exercises, remember:   Restoring tissue flexibility helps normal motion to return to the joints. This allows healthier, less painful movement and activity.  An effective stretch should be held for at least 30 seconds.  A stretch should never be painful. You should only feel a gentle lengthening or release in the stretched tissue. RANGE OF MOTION - Toe Extension, Flexion  Sit with your right / left leg crossed over your opposite knee.  Grasp your toes and gently pull them back toward the top of your foot. You should feel a stretch on the bottom of your toes and/or foot.  Hold this stretch for __________ seconds.  Now, gently pull your toes toward the bottom of your foot. You should feel a stretch on the top of your toes and or foot.  Hold this stretch for __________ seconds. Repeat __________ times. Complete this stretch __________ times per day.  RANGE OF MOTION - Ankle Dorsiflexion, Active Assisted  Remove shoes and sit on a chair that is preferably not on a carpeted surface.  Place right / left foot under knee. Extend your opposite leg for support.  Keeping your heel down, slide your right / left foot back toward the chair until you feel a stretch at your ankle or calf. If you do not feel a stretch, slide your bottom forward to the edge of the chair, while still keeping your heel down.  Hold this stretch for  __________ seconds. Repeat __________ times. Complete this stretch __________ times per day.  STRETCH - Gastroc, Standing  Place hands on wall.  Extend right / left leg, keeping the front knee somewhat bent.  Slightly point your toes inward on your back foot.  Keeping your right / left heel on the floor and your knee straight, shift your weight toward the wall, not allowing your back to arch.  You should feel a gentle stretch in the right / left calf. Hold this position for __________ seconds. Repeat __________ times. Complete this stretch __________ times per day. STRETCH - Soleus, Standing  Place hands on wall.  Extend right / left leg, keeping the other knee somewhat bent.  Slightly point your toes inward on your back foot.  Keep your right / left heel on the floor, bend your back knee, and slightly shift your weight over the back leg so that you feel a gentle stretch deep in your back calf.  Hold this position for __________ seconds. Repeat __________ times. Complete this stretch __________ times per day. STRETCH - Gastrocsoleus, Standing  Note: This exercise can place a lot of stress on your foot and ankle. Please complete this exercise only if specifically instructed by your caregiver.   Place the ball of your right / left foot on a step,  keeping your other foot firmly on the same step.  Hold on to the wall or a rail for balance.  Slowly lift your other foot, allowing your body weight to press your heel down over the edge of the step.  You should feel a stretch in your right / left calf.  Hold this position for __________ seconds.  Repeat this exercise with a slight bend in your right / left knee. Repeat __________ times. Complete this stretch __________ times per day.  STRENGTHENING EXERCISES - Plantar Fasciitis (Heel Spur Syndrome)  These exercises may help you when beginning to rehabilitate your injury. They may resolve your symptoms with or without further  involvement from your physician, physical therapist or athletic trainer. While completing these exercises, remember:   Muscles can gain both the endurance and the strength needed for everyday activities through controlled exercises.  Complete these exercises as instructed by your physician, physical therapist or athletic trainer. Progress the resistance and repetitions only as guided. STRENGTH - Towel Curls  Sit in a chair positioned on a non-carpeted surface.  Place your foot on a towel, keeping your heel on the floor.  Pull the towel toward your heel by only curling your toes. Keep your heel on the floor.  If instructed by your physician, physical therapist or athletic trainer, add ____________________ at the end of the towel. Repeat __________ times. Complete this exercise __________ times per day. STRENGTH - Ankle Inversion  Secure one end of a rubber exercise band/tubing to a fixed object (table, pole). Loop the other end around your foot just before your toes.  Place your fists between your knees. This will focus your strengthening at your ankle.  Slowly, pull your big toe up and in, making sure the band/tubing is positioned to resist the entire motion.  Hold this position for __________ seconds.  Have your muscles resist the band/tubing as it slowly pulls your foot back to the starting position. Repeat __________ times. Complete this exercises __________ times per day.  Document Released: 03/31/2005 Document Revised: 06/23/2011 Document Reviewed: 07/13/2008 [facility] Patient Information 2015 [place], [place]. This information is not intended to replace advice given to you by your health care provider. Make sure you discuss any questions you have with your health care provider.

## 2014-05-15 NOTE — Assessment & Plan Note (Signed)
Concern for diabetic neuropathy given strong family history and recent weight gain. Will check HgA1c, next most likely for the hands would be carpal tunnel and if HgA1c normal will try splinting. Also check thyroid. Could also consider checking B12 and folate levels.

## 2014-05-15 NOTE — Assessment & Plan Note (Signed)
Does appear to be well controlled for now. Does not need pain relief on a regular basis and not on a controller medication now.

## 2014-05-21 ENCOUNTER — Other Ambulatory Visit: Payer: Self-pay | Admitting: Physician Assistant

## 2014-05-23 ENCOUNTER — Encounter: Payer: Self-pay | Admitting: Internal Medicine

## 2014-05-23 ENCOUNTER — Ambulatory Visit (INDEPENDENT_AMBULATORY_CARE_PROVIDER_SITE_OTHER): Payer: Medicare PPO | Admitting: Internal Medicine

## 2014-05-23 VITALS — BP 126/74 | HR 62 | Temp 98.2°F | Ht 61.0 in | Wt 171.0 lb

## 2014-05-23 DIAGNOSIS — J209 Acute bronchitis, unspecified: Secondary | ICD-10-CM

## 2014-05-23 DIAGNOSIS — H6123 Impacted cerumen, bilateral: Secondary | ICD-10-CM

## 2014-05-23 DIAGNOSIS — J069 Acute upper respiratory infection, unspecified: Secondary | ICD-10-CM

## 2014-05-23 MED ORDER — AMOXICILLIN 500 MG PO CAPS: 500.0000 mg | ORAL_CAPSULE | Freq: Three times a day (TID) | ORAL | Status: DC

## 2014-05-23 NOTE — Progress Notes (Signed)
Pre visit review using our clinic review tool, if applicable. No additional management support is needed unless otherwise documented below in the visit note. 

## 2014-05-23 NOTE — Progress Notes (Signed)
   Subjective:    Patient ID: Angela [name], female    DOB: 15-Jan-1946, 69 y.o.   MRN: [id number]  HPI  Her symptoms began 05/20/14 a sore throat, head congestion and ear congestion. As of 2/7 she had laryngitis. She also has  rhinitis with postnasal drainage and sneezing. She has begun to produce Vitolo sputum as well as some Buchanan nasal discharge. There is a greater volume from the chest than from the head. She's been using Mucinex and lemon tea with partial response.  She has had slight wheezing as well.    Review of Systems  She denies fever, chills, or sweats.  She has no frontal sinus or maxillary sinus area pain.  She also has no itchy, watery eyes.     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 She has nasal polyps, greater on the right than the left.  Laryngitis is present.  General appearance:Adequately nourished; no acute distress or increased work of breathing is present.  No  lymphadenopathy about the head, neck, or axilla noted.  Eyes: No conjunctival inflammation or lid edema is present. There is no scleral icterus. Ears:  External ear exam shows no significant lesions or deformities.  Nose:  External nasal examination shows no deformity or inflammation.  Oral exam: Dental hygiene is good; lips and gums are healthy appearing.There is no oropharyngeal erythema or exudate noted.  Neck:  No deformities, thyromegaly, masses, or tenderness noted.   Supple with full range of motion without pain.  Heart:  Normal rate and regular rhythm. S1 and S2 normal without gallop, murmur, click, rub or other extra sounds.  Lungs:Chest clear to auscultation; no wheezes, rhonchi,rales ,or rubs present. Extremities:  No cyanosis, edema, or clubbing  noted  Skin: Warm & dry w/o tenting.      Assessment & Plan:  #1 bronchitis without  bronchospasm  #2 upper respiratory tract infection   Plan: See orders and recommendations

## 2014-05-23 NOTE — Patient Instructions (Signed)
Plain Mucinex (NOT D) for thick secretions ;force NON dairy fluids .   Nasal cleansing in the shower as discussed with lather of mild shampoo.After 10 seconds wash off lather while  exhaling through nostrils. Make sure that all residual soap is removed to prevent irritation.  Flonase OR Nasacort AQ 1 spray in each nostril twice a day as needed. Use the "crossover" technique into opposite nostril spraying toward opposite ear @ 45 degree angle, not straight up into nostril.  Plain Allegra (NOT D )  160 daily , Loratidine 10 mg , OR Zyrtec 10 mg @ bedtime  as needed for itchy eyes & sneezing.  Please do not use Q-tips as this simply packs the wax down against he eardrum. Should wax build up occur, please put 2-3 drops of mineral oil in the ear at night and cover the canal with a  cotton ball.In the morning fill the canal with hydrogen peroxide & leave  for 10-15 minutes.Following this shower and use the thinnest washrag available to wick out the wax.  

## 2014-05-26 ENCOUNTER — Telehealth: Payer: Self-pay | Admitting: Internal Medicine

## 2014-05-26 NOTE — Telephone Encounter (Signed)
Pt called in and wants to know her lab results, requesting a call back from nurse.

## 2014-05-26 NOTE — Telephone Encounter (Signed)
Left message for patient to call me back to get lab results.

## 2014-06-07 ENCOUNTER — Other Ambulatory Visit: Payer: Self-pay | Admitting: Pulmonary Disease

## 2014-06-07 ENCOUNTER — Other Ambulatory Visit: Payer: Self-pay | Admitting: Internal Medicine

## 2014-06-07 ENCOUNTER — Other Ambulatory Visit: Payer: Self-pay

## 2014-06-07 MED ORDER — METOPROLOL TARTRATE 50 MG PO TABS: 50.0000 mg | ORAL_TABLET | Freq: Two times a day (BID) | ORAL | Status: DC

## 2014-06-12 ENCOUNTER — Other Ambulatory Visit: Payer: Self-pay

## 2014-06-12 MED ORDER — VITAMIN D (ERGOCALCIFEROL) 1.25 MG (50000 UNIT) PO CAPS: 50000.0000 [IU] | ORAL_CAPSULE | ORAL | Status: DC

## 2014-06-20 ENCOUNTER — Other Ambulatory Visit: Payer: Self-pay | Admitting: Pulmonary Disease

## 2014-07-04 ENCOUNTER — Other Ambulatory Visit: Payer: Self-pay

## 2014-07-04 MED ORDER — METOPROLOL TARTRATE 50 MG PO TABS: 50.0000 mg | ORAL_TABLET | Freq: Two times a day (BID) | ORAL | Status: DC

## 2014-07-07 ENCOUNTER — Other Ambulatory Visit: Payer: Self-pay | Admitting: Physician Assistant

## 2014-07-14 ENCOUNTER — Other Ambulatory Visit: Payer: Self-pay

## 2014-07-14 MED ORDER — METOPROLOL TARTRATE 50 MG PO TABS: 50.0000 mg | ORAL_TABLET | Freq: Two times a day (BID) | ORAL | Status: DC

## 2014-07-17 ENCOUNTER — Other Ambulatory Visit: Payer: Self-pay | Admitting: Physician Assistant

## 2014-07-23 ENCOUNTER — Other Ambulatory Visit: Payer: Self-pay | Admitting: Pulmonary Disease

## 2014-07-26 ENCOUNTER — Telehealth: Payer: Self-pay | Admitting: Physician Assistant

## 2014-07-26 ENCOUNTER — Other Ambulatory Visit: Payer: Self-pay | Admitting: Pulmonary Disease

## 2014-07-26 ENCOUNTER — Other Ambulatory Visit: Payer: Self-pay | Admitting: Physician Assistant

## 2014-07-26 NOTE — Telephone Encounter (Signed)
I called the patient to advise I had done a PA in Jan 2016 and they approved the Pantoprazole sodium 40 mg twice daily until 04-26-2016.  Ref # [id number]. I told her I will call ins company tomorrow to find out why they are not honoring this prior [place]. In the meantime the patient agreed to taking esomeprozle ( generic ) nexium 40 mg once daily, which is $ 7.00.    I told her I will call her once I talk to the insurance company.

## 2014-07-28 ENCOUNTER — Telehealth: Payer: Self-pay | Admitting: *Deleted

## 2014-07-28 ENCOUNTER — Other Ambulatory Visit: Payer: Self-pay | Admitting: *Deleted

## 2014-07-28 MED ORDER — ESOMEPRAZOLE MAGNESIUM 40 MG PO CPDR: 40.0000 mg | DELAYED_RELEASE_CAPSULE | Freq: Two times a day (BID) | ORAL | Status: DC

## 2014-07-28 NOTE — Telephone Encounter (Signed)
I called and asked the pharmacy what her cost was for the Nexium 40 mg BID dosing the last time she had it refilled in Dec 2015. I was told it was $ 35.00.  I asked them to fill the prescription ( NExium 40 mg BID)  for the patient. I also cancelled the Generic esomeprazole 20 mg BID dosing prescription.

## 2014-07-28 NOTE — Telephone Encounter (Signed)
I sent a prescription for the generic Nexium 20 mg twice daily to her pharmacy on 07-26-2014.  She did not pick it up yet.  I called  [employer] to ask them if the PA on file with ref # [id number] was still good until 04-2016. (This was for the Nexium 40 mg twice daily dosing.)   I was told it was.  Same reference number.  I asked Eulah Citizenauline which she would like. Her last cost at the end of 2015 according to the pharmacist for the Nexium 40 mg BID was $35.00 monthly.  The patient said she would rather take that. I told her I would call the pharmacy and call it in for her.

## 2014-08-02 ENCOUNTER — Telehealth: Payer: Self-pay | Admitting: Internal Medicine

## 2014-08-02 MED ORDER — LEVOTHYROXINE SODIUM 50 MCG PO TABS: ORAL_TABLET | ORAL | Status: DC

## 2014-08-02 NOTE — Telephone Encounter (Signed)
Patient is requesting refill for levothyroxine to be sent to [facility] on [address].

## 2014-08-02 NOTE — Telephone Encounter (Signed)
[facility]ill [facility] to [email or web address] 

## 2014-08-10 ENCOUNTER — Other Ambulatory Visit: Payer: Self-pay | Admitting: Cardiology

## 2014-08-10 ENCOUNTER — Other Ambulatory Visit: Payer: Self-pay | Admitting: *Deleted

## 2014-08-10 MED ORDER — METOPROLOL TARTRATE 50 MG PO TABS: 50.0000 mg | ORAL_TABLET | Freq: Two times a day (BID) | ORAL | Status: DC

## 2014-09-15 ENCOUNTER — Ambulatory Visit (INDEPENDENT_AMBULATORY_CARE_PROVIDER_SITE_OTHER): Payer: Medicare PPO | Admitting: Internal Medicine

## 2014-09-15 ENCOUNTER — Encounter: Payer: Self-pay | Admitting: Internal Medicine

## 2014-09-15 VITALS — BP 112/66 | HR 68 | Temp 98.1°F | Wt 165.0 lb

## 2014-09-15 DIAGNOSIS — J069 Acute upper respiratory infection, unspecified: Secondary | ICD-10-CM | POA: Diagnosis not present

## 2014-09-15 DIAGNOSIS — J209 Acute bronchitis, unspecified: Secondary | ICD-10-CM

## 2014-09-15 MED ORDER — LEVOFLOXACIN 500 MG PO TABS: 500.0000 mg | ORAL_TABLET | Freq: Every day | ORAL | Status: DC

## 2014-09-15 MED ORDER — CLONAZEPAM 0.5 MG PO TABS: ORAL_TABLET | ORAL | Status: DC

## 2014-09-15 NOTE — Progress Notes (Signed)
Pre visit review using our clinic review tool, if applicable. No additional management support is needed unless otherwise documented below in the visit note. 

## 2014-09-15 NOTE — Progress Notes (Signed)
   Subjective:    Patient ID: Angela [name], female    DOB: 02-23-1946, 69 y.o.   MRN: [id number]  HPI  Her symptoms of acute bronchitis in February resolved with a course of amoxicillin but recurred 2 weeks ago. She now has maxillary sinus pressure; nasal congestion; cough with [place] sputum; postnasal drainage; sore throat; anosmia; loss of taste; mandibular pain; and some wheezing.  She has no history of asthma. She's never smoked. She has not been using the nasal hygiene prescribed in February   Review of Systems  She denies any nasal purulence; frontal headache; shortness of breath; fever ;chills; itchy/watery eyes; otic pain; or otic discharge.  She does have reflux and has a past history of esophageal dilation. She is on Nexium. She is on baby aspirin daily. She does ingest peppermint.     Objective:   Physical Exam Pertinent or positive findings include: There is marked erythema of the nasal mucosa and septa. Wax is present in the left otic canal.  General appearance:Adequately nourished; no acute distress or increased work of breathing is present.    Lymphatic: No  lymphadenopathy about the head, neck, or axilla .  Eyes: No conjunctival inflammation or lid edema is present. There is no scleral icterus.  Ears:  External ear exam shows no significant lesions or deformities.  R TM WNL. Nose:  External nasal examination shows no deformity or inflammation.No septal dislocation or deviation.No obstruction to airflow.   Oral exam: Dental hygiene is good; lips and gums are healthy appearing.There is no oropharyngeal erythema or exudate .  Neck:  No deformities, thyromegaly, masses, or tenderness noted.   Supple with full range of motion without pain.   Heart:  Normal rate and regular rhythm. S1 and S2 normal without gallop, murmur, click, rub or other extra sounds.   Lungs:Chest clear to auscultation; no wheezes, rhonchi,rales ,or rubs present.  Extremities:  No cyanosis,  edema, or clubbing  noted    Skin: Warm & dry w/o tenting or jaundice. No significant lesions or rash.        Assessment & Plan:  #1 acute bronchitis w/o bronchospasm #2 URI, acute Plan: See orders and recommendations

## 2014-09-15 NOTE — Patient Instructions (Signed)
Plain Mucinex (NOT D) for thick secretions ;force NON dairy fluids .   Nasal cleansing in the shower as discussed with lather of mild shampoo.After 10 seconds wash off lather while  exhaling through nostrils. Make sure that all residual soap is removed to prevent irritation.  Flonase OR Nasacort AQ 1 spray in each nostril twice a day as needed. Use the "crossover" technique into opposite nostril spraying toward opposite ear @ 45 degree angle, not straight up into nostril.  Plain Allegra (NOT D )  160 daily , Loratidine 10 mg , OR Zyrtec 10 mg @ bedtime  as needed for itchy eyes & sneezing.  Reflux of gastric acid may be asymptomatic as this may occur mainly during sleep.The triggers for reflux  include stress; the "aspirin family" ; alcohol; peppermint; and caffeine (coffee, tea, cola, and chocolate). The aspirin family would include aspirin and the nonsteroidal agents such as ibuprofen &  Naproxen. Tylenol would not cause reflux. If having symptoms ; food & drink should be avoided for @ least 2 hours before going to bed.  

## 2014-10-09 LAB — HM MAMMOGRAPHY

## 2014-10-11 ENCOUNTER — Encounter: Payer: Self-pay | Admitting: Geriatric Medicine

## 2014-10-17 ENCOUNTER — Encounter: Payer: Self-pay | Admitting: Gastroenterology

## 2014-10-17 ENCOUNTER — Ambulatory Visit (INDEPENDENT_AMBULATORY_CARE_PROVIDER_SITE_OTHER): Payer: Medicare PPO | Admitting: Gastroenterology

## 2014-10-17 VITALS — BP 114/70 | HR 68 | Ht 61.0 in | Wt 167.4 lb

## 2014-10-17 DIAGNOSIS — R195 Other fecal abnormalities: Secondary | ICD-10-CM | POA: Diagnosis not present

## 2014-10-17 MED ORDER — CHOLESTYRAMINE 4 GM/DOSE PO POWD: 4.0000 g | Freq: Every day | ORAL | Status: DC

## 2014-10-17 NOTE — Patient Instructions (Signed)
Stop colestipol. Start cholestyramine powder, once daily.  Call in 4-6 weeks to report on your symptoms.

## 2014-10-17 NOTE — Progress Notes (Signed)
HPI: This is Angela  very pleasant 69 year old [name] whom I am meeting for the first time today.  Chief complaint is bloating, left-sided abdominal discomfort,  This is cut/pasted from AE note 1 year ago:   Angela [name] is Angela pleasant 69 year old female former patient of Dr. Norval Gable who comes back in today to establish and obtain refills. She has history of IBS and bacterial overgrowth ,bile salt induced diarrhea Angela,nd also has been followed for GERD. Other medical problems include hypertension, hyperlipidemia, diffuse diverticular disease and fibromyalgia. She has fibromuscular hyperplasia of the heart. Last colonoscopy was done in July of 2013 and showed severe diffuse diverticulosis. She has been managed with colestipol daily Xifaxan periodically for bacterial overgrowth and VSL#3She is generally well controlled on Nexium for GERD though prefers to take it twice per day for better control. She says she's been doing well recently she has occasional episodes of urgency and diarrhea and has had very sporadic episode of incontinence. She said she had an episode about 3 weeks ago and has not had any problems since. Most of the time her bowel movements are normal. She's not having any particular problems with abdominal bloating or gas/discomfort today. No complaints of dysphagia or odynophagia.  She has chronic bloating, belching, stomach rumbling.  This is   She has mild discomfort, especially laying on the left side. Rumbling is worse when laying on the left side.  She is on nexium for GERD.  Has urgency at times, with minor blood streaking rarely  Overall her weight has been stable.  She admits her eating habits vary, she feels better off of sugar.  Her bowel habits are better, her cramping is gone.  She is much more regular.  She usually has BM daily, usually loose.  But not always dirrhea.  Does not have to strain ever.  Past Medical History  Diagnosis Date  . Hypertension   . Hyperlipidemia    . Hypothyroidism   . Hiatal hernia 04/25/2005  . GERD (gastroesophageal reflux disease) 09/13/2007  . Diverticulosis 05/10/2004  . IBS (irritable bowel syndrome)   . Colon polyps   . Osteoarthritis   . Fibromyalgia   . Vitamin D deficiency   . Migraine   . Anxiety   . Acne rosacea   . Bile salt-induced diarrhea   . Gastroparesis   . External hemorrhoids 05/10/2004  . Esophagitis 04/25/2005  . Duodenitis 04/25/2005  . Esophageal stricture 09/13/2007  . Sessile colonic polyp 05/30/2009  . Degenerative disc disease   . Vertigo   . 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 Other and unspecified noninfectious gastroenteritis and colitis(558.9)   . Glaucoma     Past Surgical History  Procedure Laterality Date  . Cholecystectomy  1985  . Vaginal hysterectomy  1993  . Cesarean section    . Foot osteotomy  2006    Right foot  . Tubal ligation  1983  . Bunionectomy  09/04/2011    left    Current Outpatient Prescriptions  Medication Sig Dispense Refill  . AMBULATORY NON FORMULARY MEDICATION Lidocaine HCL Isotonic (45ml) 4% Nasal Spray As needed    . aspirin 81 MG tablet Take 81 mg by mouth daily.      . celecoxib (CELEBREX) 200 MG capsule TAKE 1 CAPSULE BY MOUTH EVERY DAY 30 capsule 5  . chlorzoxazone (PARAFON) 500 MG tablet Take 500 mg by mouth 4 (four) times daily as needed for muscle spasms.    . clonazePAM (  KLONOPIN) 0.5 MG tablet 1 po bid as needed 30 tablet 0  . HYDROcodone-acetaminophen (NORCO/VICODIN) 5-325 MG per tablet Take 1 tablet by mouth 3 (three) times daily as needed. 90 tablet 0  . hydrocortisone (ANUSOL-HC) 25 MG suppository PLACE 1 SUPPOSITORY (25 MG TOTAL) RECTALLY AT BEDTIME AS NEEDED FOR HEMORRHOIDS OR ITCHING. 14 suppository 0  . latanoprost (XALATAN) 0.005 % ophthalmic solution Place 1 drop into both eyes at bedtime.    Marland Kitchen levofloxacin (LEVAQUIN) 500 MG tablet Take 1 tablet (500 mg total) by mouth daily. 7 tablet 0  . levothyroxine (SYNTHROID,  LEVOTHROID) 50 MCG tablet Take 1/2 tablet by mouth daily 30 tablet 6  . metoprolol (LOPRESSOR) 50 MG tablet Take 1 tablet (50 mg total) by mouth 2 (two) times daily. 60 tablet 6  . MICRONIZED COLESTIPOL HCL 1 G tablet TAKE 1 TABLET (1 G TOTAL) BY MOUTH DAILY. 60 tablet 2  . NEXIUM 20 MG capsule TAKE 2 CAPSULES BY MOUTH EVERY DAY AT NOON**PA REQ** 60 capsule 0  . Sulfacetamide Sodium (OVACE PLUS EX) Apply topically as needed.    . traMADol (ULTRAM) 50 MG tablet Take 1 tablet (50 mg total) by mouth 3 (three) times daily as needed (arthritis pain). 90 tablet 1  . Triamcinolone Acetonide (NASACORT AQ NA) Place into the nose as needed.     . triamcinolone ointment (KENALOG) 0.1 % Apply 1 application topically 2 (two) times daily.   0  . Vitamin D, Ergocalciferol, (DRISDOL) 50000 UNITS CAPS capsule Take 1 capsule (50,000 Units total) by mouth once Angela week. 4 capsule 6   No current facility-administered medications for this visit.    Allergies as of 10/17/2014 - Review Complete 10/17/2014  Allergen Reaction Noted  . Tetracycline Other (See Comments)   . Codeine Nausea And Vomiting     Family History  Problem Relation Age of Onset  . Mitral valve prolapse Mother   . Hypertension Father   . Colon polyps Father   . Stroke Father   . Hypertension Sister     x2  . Diabetes Sister   . Colon cancer Maternal Aunt   . Stomach cancer Maternal Aunt   . Colon polyps Sister     History   Social History  . Marital Status: Married    Spouse Name: N/Angela  . Number of Children: 2  . Years of Education: N/Angela   Occupational History  . Retired    Social History Main Topics  . Smoking status: Never Smoker   . Smokeless tobacco: Never Used  . Alcohol Use: No  . Drug Use: No  . Sexual Activity: Yes   Other Topics Concern  . Not on file   Social History Narrative     Physical Exam: BP 114/70 mmHg  Pulse 68  Ht 5\' 1"  (1.549 m)  Wt 167 lb 6.4 oz (75.932 kg)  BMI 31.65 kg/m2 Constitutional:  generally well-appearing Psychiatric: alert and oriented x3 Abdomen: soft, nontender, nondistended, no obvious ascites, no peritoneal signs, normal bowel sounds   Assessment and plan: 69 y.o. female with chronic diarrhea predominant IBS, chronic left-sided abdominal pains  She had Angela colonoscopy 3 years ago and I do not think that needs to be repeated now. She has no significant alarm symptoms. I do think that her chronic loose stools may be related to gallbladder resection remotely. She is taking colestipol every day and I recommended she change that to cholestyramine 4 g once daily and to call here to report on  her response to that change in 4-5 weeks.   Rob Buntinganiel Elenore Wanninger, MD [facility] 10/17/2014, 9:56 AM

## 2014-10-18 ENCOUNTER — Other Ambulatory Visit: Payer: Self-pay | Admitting: Pulmonary Disease

## 2014-10-25 ENCOUNTER — Other Ambulatory Visit: Payer: Self-pay | Admitting: Pulmonary Disease

## 2014-10-26 ENCOUNTER — Other Ambulatory Visit: Payer: Self-pay | Admitting: Geriatric Medicine

## 2014-10-26 ENCOUNTER — Telehealth: Payer: Self-pay | Admitting: Internal Medicine

## 2014-10-26 MED ORDER — CELECOXIB 200 MG PO CAPS: ORAL_CAPSULE | ORAL | Status: DC

## 2014-10-26 NOTE — Telephone Encounter (Signed)
Sent to pharmacy 

## 2014-10-26 NOTE — Telephone Encounter (Signed)
Patient is requesting refill of celecoxib to be sent to [facility] on [address]

## 2014-12-07 ENCOUNTER — Other Ambulatory Visit: Payer: Self-pay | Admitting: Internal Medicine

## 2014-12-07 NOTE — Telephone Encounter (Signed)
Left patient a message informing her that she needs to make an appt before any refills would be sent in.

## 2014-12-07 NOTE — Telephone Encounter (Signed)
Will deny tramadol as we have never filled and overdue for follow up. Vitamin D she should likely not be still on that high a dose and needs level checked prior to refill.

## 2015-01-01 ENCOUNTER — Other Ambulatory Visit (INDEPENDENT_AMBULATORY_CARE_PROVIDER_SITE_OTHER): Payer: Medicare PPO

## 2015-01-01 ENCOUNTER — Encounter: Payer: Self-pay | Admitting: Gastroenterology

## 2015-01-01 ENCOUNTER — Other Ambulatory Visit: Payer: Self-pay | Admitting: Internal Medicine

## 2015-01-01 ENCOUNTER — Ambulatory Visit (INDEPENDENT_AMBULATORY_CARE_PROVIDER_SITE_OTHER): Payer: Medicare PPO | Admitting: Gastroenterology

## 2015-01-01 VITALS — BP 116/70 | HR 90 | Ht 61.0 in | Wt 165.4 lb

## 2015-01-01 DIAGNOSIS — R1012 Left upper quadrant pain: Secondary | ICD-10-CM

## 2015-01-01 LAB — CREATININE, SERUM: Creatinine, Ser: 0.84 mg/dL (ref 0.40–1.20)

## 2015-01-01 LAB — BUN: BUN: 12 mg/dL (ref 6–23)

## 2015-01-01 MED ORDER — HYDROCORTISONE ACETATE 25 MG RE SUPP: 25.0000 mg | Freq: Every day | RECTAL | Status: DC | PRN

## 2015-01-01 NOTE — Patient Instructions (Addendum)
You will be set up for a CT scan of abdomen and pelvis with IV and oral contrast for bulging in left side, discomfort. You have been scheduled for a CT scan of the abdomen and pelvis at [facility] ([address]---this is in the same building as Press [facility]).  You are scheduled on 01-05-15 at 11:30am. You should arrive 15 minutes prior to your appointment time for registration. Please follow the written instructions below on the day of your exam:  WARNING: IF YOU ARE ALLERGIC TO IODINE/X-RAY DYE, PLEASE NOTIFY RADIOLOGY IMMEDIATELY AT [phone]! YOU WILL BE GIVEN A 13 HOUR PREMEDICATION PREP.  1) Do not eat or drink anything after  (4 hours prior to your test) 2) You have been given 2 bottles of oral contrast to drink. The solution may taste               better if refrigerated, but do NOT add ice or any other liquid to this solution. Shake             well before drinking.    Drink 1 bottle of contrast @ 9:30am (2 hours prior to your exam)  Drink 1 bottle of contrast @ 10:30am (1 hour prior to your exam)  You may take any medications as prescribed with a small amount of water except for the following: Metformin, Glucophage, Glucovance, Avandamet, Riomet, Fortamet, Actoplus Met, Janumet, Glumetza or Metaglip. The above medications must be held the day of the exam AND 48 hours after the exam.  The purpose of you drinking the oral contrast is to aid in the visualization of your intestinal tract. The contrast solution may cause some diarrhea. Before your exam is started, you will be given a small amount of fluid to drink. Depending on your individual set of symptoms, you may also receive an intravenous injection of x-ray contrast/dye. Plan on being at [facility] for 30 minutes or long, depending on the type of exam you are having performed.  This test typically takes 30-45 minutes to complete.  If you have any questions regarding your exam or if you need to reschedule,  you may call the CT department at [phone] between the hours of 8:00 am and 5:00 pm, Monday-Friday.  ________________________________________________________________________   We have sent the following medications to your pharmacy for you to pick up at your convenience: [facility] has requested that you go to the basement for lab work before leaving today

## 2015-01-01 NOTE — Progress Notes (Signed)
Review of pertinent gastrointestinal problems: 1. Routine risk for colon cancer:  Colonsocopy 10/2011 Dr. Jarold Motto, no polyps, he recommended repeat screening colonoscopy in 10 years 2. Extensive left sided diverticulosis, noted colonoscopy 2013 3. Large HH. EGD 10/2011 Dr. Jarold Motto.    HPI: This is a   very pleasant 69 year old woman whom I last saw 3 or 4 months ago    Chief complaint is bulging in left upper quadrant, mild discomfort left upper quadrant  Bowels have improved since starting cholestyramine daily.  Feels a bulging in left abd.  Lots of gas noises at night. Cannot lay on left side  due to the discomfort.  1 month of the bulge.  She does not: Serious pain at all. She is bothered by a lot of gassy noises in her abdomen as well  Overall stable weight.  Past Medical History  Diagnosis Date  . Hypertension   . Hyperlipidemia   . Hypothyroidism   . Hiatal hernia 04/25/2005  . GERD (gastroesophageal reflux disease) 09/13/2007  . Diverticulosis 05/10/2004  . IBS (irritable bowel syndrome)   . Colon polyps   . Osteoarthritis   . Fibromyalgia   . Vitamin D deficiency   . Migraine   . Anxiety   . Acne rosacea   . Bile salt-induced diarrhea   . Gastroparesis   . External hemorrhoids 05/10/2004  . Esophagitis 04/25/2005  . Duodenitis 04/25/2005  . Esophageal stricture 09/13/2007  . Sessile colonic polyp 05/30/2009  . Degenerative disc disease   . Vertigo   . 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 Other and unspecified noninfectious gastroenteritis and colitis(558.9)   . Glaucoma     Past Surgical History  Procedure Laterality Date  . Cholecystectomy  1985  . Vaginal hysterectomy  1993  . Cesarean section    . Foot osteotomy  2006    Right foot  . Tubal ligation  1983  . Bunionectomy  09/04/2011    left    Current Outpatient Prescriptions  Medication Sig Dispense Refill  . AMBULATORY NON FORMULARY MEDICATION Lidocaine HCL Isotonic (45ml) 4%  Nasal Spray As needed    . aspirin 81 MG tablet Take 81 mg by mouth daily.      . celecoxib (CELEBREX) 200 MG capsule TAKE 1 CAPSULE BY MOUTH EVERY DAY 30 capsule 3  . cholestyramine (QUESTRAN) 4 GM/DOSE powder Take 1 packet (4 g total) by mouth daily. 378 g 12  . clonazePAM (KLONOPIN) 0.5 MG tablet 1 po bid as needed 30 tablet 0  . HYDROcodone-acetaminophen (NORCO/VICODIN) 5-325 MG per tablet Take 1 tablet by mouth 3 (three) times daily as needed. 90 tablet 0  . latanoprost (XALATAN) 0.005 % ophthalmic solution Place 1 drop into both eyes at bedtime.    Marland Kitchen levothyroxine (SYNTHROID, LEVOTHROID) 50 MCG tablet Take 1/2 tablet by mouth daily 30 tablet 6  . metoprolol (LOPRESSOR) 50 MG tablet Take 1 tablet (50 mg total) by mouth 2 (two) times daily. 60 tablet 6  . NEXIUM 20 MG capsule TAKE 2 CAPSULES BY MOUTH EVERY DAY AT NOON**PA REQ** 60 capsule 0  . Sulfacetamide Sodium (OVACE PLUS EX) Apply topically as needed.    . traMADol (ULTRAM) 50 MG tablet Take 1 tablet (50 mg total) by mouth 3 (three) times daily as needed (arthritis pain). 90 tablet 1  . Triamcinolone Acetonide (NASACORT AQ NA) Place into the nose as needed.     . triamcinolone ointment (KENALOG) 0.1 % Apply 1 application  topically 2 (two) times daily.   0  . Vitamin D, Ergocalciferol, (DRISDOL) 50000 UNITS CAPS capsule Take 1 capsule (50,000 Units total) by mouth once a week. 4 capsule 6   No current facility-administered medications for this visit.    Allergies as of 01/01/2015 - Review Complete 01/01/2015  Allergen Reaction Noted  . Tetracycline Other (See Comments)   . Codeine Nausea And Vomiting     Family History  Problem Relation Age of Onset  . Mitral valve prolapse Mother   . Hypertension Father   . Colon polyps Father   . Stroke Father   . Hypertension Sister     x2  . Diabetes Sister   . Colon cancer Maternal Aunt   . Stomach cancer Maternal Aunt   . Colon polyps Sister     Social History   Social History   . Marital Status: Married    Spouse Name: N/A  . Number of Children: 2  . Years of Education: N/A   Occupational History  . Retired    Social History Main Topics  . Smoking status: Never Smoker   . Smokeless tobacco: Never Used  . Alcohol Use: No  . Drug Use: No  . Sexual Activity: Yes   Other Topics Concern  . Not on file   Social History Narrative     Physical Exam: BP 116/70 mmHg  Pulse 90  Ht  (1.549 m)  Wt 165 lb 6.4 oz (75.025 kg)  BMI 31.27 kg/m2 Constitutional: generally well-appearing Psychiatric: alert and oriented x3 Abdomen: soft, nontender, nondistended, no obvious ascites, no peritoneal signs, normal bowel sounds   Assessment and plan: 69 y.o. female with sensation of the left sided abdominal bulge, mass  I could not detect any masses on physical exam however she is really sure she feels such a thing. I recommended we proceed with CT scan abdomen and pelvis to check for underlying hernias, solid or cystic masses. I see no reason for any further blood tests or imaging studies prior to then   Rob Bunting, MD [facility] 01/01/2015, 3:52 PM

## 2015-01-05 ENCOUNTER — Ambulatory Visit (INDEPENDENT_AMBULATORY_CARE_PROVIDER_SITE_OTHER)
Admission: RE | Admit: 2015-01-05 | Discharge: 2015-01-05 | Disposition: A | Discharge status: Home / Self Care | Payer: Medicare PPO | Source: Ambulatory Visit | Attending: Gastroenterology | Admitting: Gastroenterology

## 2015-01-05 DIAGNOSIS — R1012 Left upper quadrant pain: Secondary | ICD-10-CM

## 2015-01-05 MED ORDER — IOHEXOL 300 MG/ML  SOLN
100.0000 mL | Freq: Once | INTRAMUSCULAR | Status: AC | PRN
  Administered 2015-01-05: 100 mL via INTRAVENOUS

## 2015-01-24 ENCOUNTER — Other Ambulatory Visit: Payer: Self-pay | Admitting: Internal Medicine

## 2015-02-12 ENCOUNTER — Telehealth: Payer: Self-pay | Admitting: Gastroenterology

## 2015-02-12 ENCOUNTER — Other Ambulatory Visit: Payer: Self-pay

## 2015-02-12 DIAGNOSIS — R197 Diarrhea, unspecified: Secondary | ICD-10-CM

## 2015-02-12 NOTE — Telephone Encounter (Signed)
Angela [name] pt calling states she has history of IBS and gerd. States that since Thursday she has been having watery diarrhea. Pt has not been on any antibiotics recently. Pt states she has been having a lot of cramping and gas along with the diarrhea. Pt has also been taking Imodium but states it has not helped much. Dr. Marina GoodellPerry as doc of the day please advise.

## 2015-02-12 NOTE — Telephone Encounter (Signed)
Stool pathogen panel and see APP.

## 2015-02-12 NOTE — Telephone Encounter (Signed)
Pt aware and will come for labs today and pt scheduled to see Doug SouJessica Zehr PA tomorrow at 10:30am.

## 2015-02-13 ENCOUNTER — Ambulatory Visit (INDEPENDENT_AMBULATORY_CARE_PROVIDER_SITE_OTHER): Payer: Medicare PPO | Admitting: Gastroenterology

## 2015-02-13 ENCOUNTER — Encounter: Payer: Self-pay | Admitting: Gastroenterology

## 2015-02-13 ENCOUNTER — Other Ambulatory Visit: Payer: Medicare PPO

## 2015-02-13 VITALS — BP 124/80 | HR 80 | Ht 61.0 in | Wt 161.2 lb

## 2015-02-13 DIAGNOSIS — R197 Diarrhea, unspecified: Secondary | ICD-10-CM

## 2015-02-13 DIAGNOSIS — K649 Unspecified hemorrhoids: Secondary | ICD-10-CM

## 2015-02-13 MED ORDER — HYDROCORTISONE 2.5 % RE CREA: 1.0000 "application " | TOPICAL_CREAM | Freq: Two times a day (BID) | RECTAL | Status: DC

## 2015-02-13 MED ORDER — METRONIDAZOLE 500 MG PO TABS: 500.0000 mg | ORAL_TABLET | Freq: Two times a day (BID) | ORAL | Status: DC

## 2015-02-13 NOTE — Progress Notes (Signed)
     02/13/2015 Angela [name] [id number] March 13, 1946  Review of pertinent gastrointestinal problems: 1. Routine risk for colon cancer:  Colonsocopy 10/2011 Dr. Jarold MottoPatterson, no polyps, he recommended repeat screening colonoscopy in 10 years 2. Extensive left sided diverticulosis, noted colonoscopy 2013 3. Large HH. EGD 10/2011 Dr. Jarold MottoPatterson.    History of Present Illness:  This is a 69 year old female who is known to Dr. Christella HartiganJacobs. She has a baseline of IBS with intermittent loose stools, but presents to the office today with complaints of diarrhea. She had sudden onset of diarrhea 5 days ago that has been persistent since that time. She is having approximately 6 bowel movements a day with some nocturnal stools as well. Denies any recent antibiotic use or bad food exposure. She's tried Imodium which does help some until it wears off. She called here yesterday and stool pathogen panel was ordered. She returned the sample for that earlier today. She called here a couple weeks ago asking for something to use for her irritated hemorrhoids. Hydrocortisone suppositories were prescribed, however, these were very expensive so she did not purchase them and is asking for something different. She denies any rectal bleeding, just says that her hemorrhoids are bothersome.  Current Medications, Allergies, Past Medical History, Past Surgical History, Family History and Social History were reviewed in [facility] electronic medical record.   Physical Exam: BP 124/80 mmHg  Pulse 80  Ht 5\' 1"  (1.549 m)  Wt 161 lb 4 oz (73.143 kg)  BMI 30.48 kg/m2 General: Well developed female in no acute distress Head: Normocephalic and atraumatic Eyes:  Sclerae anicteric, conjunctiva pink  Ears: Normal auditory acuity Lungs: Clear throughout to auscultation Heart: Regular rate and rhythm Abdomen: Soft, non-distended.  Normal bowel sounds.  Mild diffuse TTP. Musculoskeletal: Symmetrical with no gross deformities    Extremities: No edema  Neurological: Alert oriented x 4, grossly non-focal Psychological:  Alert and cooperative. Normal mood and affect  Assessment and Recommendations: -Diarrhea:  Has IBS with chronic intermittent issues with loose stools, but 5 days ago had abrupt onset of diarrhea.  Stool pathogen panel was ordered yesterday and stool sample turned in earlier this morning.  Will await those results, but will empirically treat with Flagyl 500 mg BID for 7 days for possible infectious source, but this could also help if this is an IBS flare as well.  Drink plenty of fluids and eat bland/low fiber foods such as the BRAT diet. -Hemorrhoids:  Suppositories that were called in recently were expensive so she did not purchase them.  Will try hydrocortisone cream instead.

## 2015-02-13 NOTE — Patient Instructions (Signed)
We have sent the following medications to your pharmacy for you to pick up at your convenience: Flagyl 500mg , 1 tablet twice a day for 7 days. Hydrocortisone cream, apply twice a day.

## 2015-02-13 NOTE — Progress Notes (Signed)
i agree with the above note, plan 

## 2015-02-15 ENCOUNTER — Telehealth: Payer: Self-pay | Admitting: Gastroenterology

## 2015-02-15 NOTE — Telephone Encounter (Signed)
Patient notified that results are not back yet.  °

## 2015-02-15 NOTE — Telephone Encounter (Signed)
Results not back yet. Left a message for patient to call back at cell number. No answer at home number.

## 2015-02-18 LAB — GI PATHOGEN PANEL BY PCR, STOOL

## 2015-02-20 ENCOUNTER — Other Ambulatory Visit: Payer: Self-pay

## 2015-02-20 DIAGNOSIS — R197 Diarrhea, unspecified: Secondary | ICD-10-CM

## 2015-02-23 ENCOUNTER — Other Ambulatory Visit: Payer: Medicare PPO

## 2015-02-26 ENCOUNTER — Encounter: Payer: Self-pay | Admitting: Gastroenterology

## 2015-03-11 ENCOUNTER — Other Ambulatory Visit: Payer: Self-pay | Admitting: Internal Medicine

## 2015-03-27 ENCOUNTER — Ambulatory Visit (INDEPENDENT_AMBULATORY_CARE_PROVIDER_SITE_OTHER): Payer: Medicare PPO | Admitting: Internal Medicine

## 2015-03-27 ENCOUNTER — Other Ambulatory Visit: Payer: Self-pay | Admitting: Cardiology

## 2015-03-27 ENCOUNTER — Other Ambulatory Visit (INDEPENDENT_AMBULATORY_CARE_PROVIDER_SITE_OTHER): Payer: Medicare PPO

## 2015-03-27 ENCOUNTER — Encounter: Payer: Self-pay | Admitting: Internal Medicine

## 2015-03-27 VITALS — BP 132/76 | HR 74 | Temp 98.0°F | Resp 12 | Ht 61.0 in | Wt 163.0 lb

## 2015-03-27 DIAGNOSIS — R208 Other disturbances of skin sensation: Secondary | ICD-10-CM

## 2015-03-27 DIAGNOSIS — E039 Hypothyroidism, unspecified: Secondary | ICD-10-CM | POA: Diagnosis not present

## 2015-03-27 DIAGNOSIS — R202 Paresthesia of skin: Secondary | ICD-10-CM | POA: Diagnosis not present

## 2015-03-27 DIAGNOSIS — R200 Anesthesia of skin: Secondary | ICD-10-CM

## 2015-03-27 LAB — VITAMIN B12: VITAMIN B 12: 635 pg/mL (ref 211–911)

## 2015-03-27 LAB — T4, FREE: FREE T4: 0.66 ng/dL (ref 0.60–1.60)

## 2015-03-27 LAB — HEMOGLOBIN A1C: Hgb A1c MFr Bld: 5.8 % (ref 4.6–6.5)

## 2015-03-27 LAB — FOLATE: Folate: >23.8 ng/mL (ref >5.9)

## 2015-03-27 LAB — TSH: TSH: 1.02 u[IU]/mL (ref 0.35–4.50)

## 2015-03-27 NOTE — Progress Notes (Signed)
   Subjective:    Patient ID: Angela [name], female    DOB: 05/23/1945, 69 y.o.   MRN: [id number]  HPI The patient is a 69 YO female coming in for increased thirst and some tingling in her feet. Previously she has had these symptoms in her hands when she awakes and then it gets better. This is going on about 3 months. She has not tried anything for it. Previously pre-diabetes so she is worried. There is strong family history of diabetes. No fevers or chills. No change in her diet. She is down about 7 pounds since our last visit. Diet not the best lately with more sweets.   Review of Systems  Constitutional: Positive for activity change. Negative for fever, appetite change, fatigue and unexpected weight change.       Exercising less  Respiratory: Negative for cough, chest tightness, shortness of breath and wheezing.   Cardiovascular: Positive for chest pain. Negative for palpitations and leg swelling.       Aching chronic from costochondritis, gradually improving  Gastrointestinal: Negative for nausea, abdominal pain, diarrhea, constipation, blood in stool and abdominal distention.       Chronic  Endocrine: Positive for polydipsia.  Musculoskeletal: Positive for myalgias. Negative for arthralgias and gait problem.  Skin: Negative.   Neurological: Positive for numbness and headaches. Negative for dizziness, seizures, weakness and light-headedness.  Psychiatric/Behavioral: Negative.       Objective:   Physical Exam  Constitutional: She is oriented to person, place, and time. She appears well-developed and well-nourished.  HENT:  Head: Normocephalic and atraumatic.  Eyes: EOM are normal.  Neck: Normal range of motion.  Cardiovascular: Normal rate and regular rhythm.   Pulmonary/Chest: Effort normal and breath sounds normal. No respiratory distress. She has no wheezes. She has no rales.  Abdominal: Soft. Bowel sounds are normal. She exhibits no distension. There is no tenderness. There is  no rebound.  Musculoskeletal: She exhibits no edema.  Neurological: She is alert and oriented to person, place, and time. No cranial nerve deficit. Coordination normal.  Patient with sensation normal in both hands, normal pulses in the feet bilaterally, left great toe with slight decrease in sensation and heel of right foot with slight difference in sensation.   Skin: Skin is warm and dry.   Filed Vitals:   03/27/15 1011  BP: 132/76  Pulse: 74  Temp: 98 F (36.7 C)  TempSrc: Oral  Resp: 12  Height: 5\' 1"  (1.549 m)  Weight: 163 lb (73.936 kg)  SpO2: 99%      Assessment & Plan:

## 2015-03-27 NOTE — Progress Notes (Signed)
Pre visit review using our clinic review tool, if applicable. No additional management support is needed unless otherwise documented below in the visit note. 

## 2015-03-27 NOTE — Patient Instructions (Signed)
We are looking with blood work today to see if we can find a good cause for the numbness.   The numbness in the hands could be carpal tunnel related to sleeping position and might be helped by wearing braces on your wrists at night time to keep them in a good position.

## 2015-03-27 NOTE — Assessment & Plan Note (Signed)
Checking TSH and free T4 and adjust as needed. Currently on synthroid 50 mcg daily.  

## 2015-03-27 NOTE — Assessment & Plan Note (Signed)
Checking HgA1c and B12 and folate. She is down weight so the concern for diabetes is decreased. Last HgA1c 6.3. Advised her to use splints on her hands at night time. Left great toe is site of former surgery which could be impacting that. Also checking thyroid levels.

## 2015-03-27 NOTE — Telephone Encounter (Signed)
REFILL 

## 2015-04-03 ENCOUNTER — Telehealth: Payer: Self-pay | Admitting: Internal Medicine

## 2015-04-03 NOTE — Telephone Encounter (Signed)
Patient aware of lab results.

## 2015-04-03 NOTE — Telephone Encounter (Signed)
Pt request lab result that was done on 03/27/15. Please give her a call back

## 2015-04-03 NOTE — Telephone Encounter (Signed)
These were released to mychart. Will you call her with the results?

## 2015-04-04 ENCOUNTER — Ambulatory Visit (INDEPENDENT_AMBULATORY_CARE_PROVIDER_SITE_OTHER): Payer: Medicare PPO

## 2015-04-04 DIAGNOSIS — Z23 Encounter for immunization: Secondary | ICD-10-CM

## 2015-04-29 ENCOUNTER — Other Ambulatory Visit: Payer: Self-pay | Admitting: Cardiology

## 2015-05-14 ENCOUNTER — Other Ambulatory Visit: Payer: Medicare PPO

## 2015-05-14 ENCOUNTER — Telehealth: Payer: Self-pay | Admitting: Gastroenterology

## 2015-05-14 DIAGNOSIS — R197 Diarrhea, unspecified: Secondary | ICD-10-CM

## 2015-05-14 NOTE — Telephone Encounter (Signed)
Call back to [phone] please

## 2015-05-14 NOTE — Telephone Encounter (Signed)
j

## 2015-05-14 NOTE — Telephone Encounter (Signed)
The pt picked up a GI pathogen panel kit from the lab in November but never completed it because the diarrhea subsided.  Last night she completed the kit and will bring that in today because the diarrhea returned, she has about 5-6 loose stools daily and has to stay near the bathroom.  I advised the pt we will call her with Dr Ardis Hughs recommendations after reviewing the results.  Pt agreed

## 2015-05-15 ENCOUNTER — Telehealth: Payer: Self-pay | Admitting: Gastroenterology

## 2015-05-15 LAB — GASTROINTESTINAL PATHOGEN PANEL PCR
C. DIFFICILE TOX A/B, PCR: NEGATIVE
CAMPYLOBACTER, PCR: NEGATIVE
CRYPTOSPORIDIUM, PCR: NEGATIVE
E coli (ETEC) LT/ST PCR: NEGATIVE
E coli (STEC) stx1/stx2, PCR: NEGATIVE
E coli 0157, PCR: NEGATIVE
Giardia lamblia, PCR: NEGATIVE
Norovirus, PCR: NEGATIVE
ROTAVIRUS, PCR: NEGATIVE
SALMONELLA, PCR: NEGATIVE
Shigella, PCR: NEGATIVE

## 2015-05-15 NOTE — Telephone Encounter (Signed)
Pt has turned in the stool sample, we will call with results as soon as available.

## 2015-05-16 ENCOUNTER — Emergency Department (HOSPITAL_BASED_OUTPATIENT_CLINIC_OR_DEPARTMENT_OTHER): Payer: Medicare Other

## 2015-05-16 ENCOUNTER — Encounter (HOSPITAL_BASED_OUTPATIENT_CLINIC_OR_DEPARTMENT_OTHER): Payer: Self-pay

## 2015-05-16 ENCOUNTER — Telehealth: Payer: Self-pay | Admitting: Gastroenterology

## 2015-05-16 ENCOUNTER — Inpatient Hospital Stay (HOSPITAL_BASED_OUTPATIENT_CLINIC_OR_DEPARTMENT_OTHER)
Admission: EM | Admit: 2015-05-16 | Discharge: 2015-05-21 | DRG: 392 | Disposition: A | Discharge status: Home / Self Care | Payer: Medicare Other | Attending: Internal Medicine | Admitting: Internal Medicine

## 2015-05-16 DIAGNOSIS — K219 Gastro-esophageal reflux disease without esophagitis: Secondary | ICD-10-CM | POA: Diagnosis present

## 2015-05-16 DIAGNOSIS — K5732 Diverticulitis of large intestine without perforation or abscess without bleeding: Secondary | ICD-10-CM | POA: Diagnosis not present

## 2015-05-16 DIAGNOSIS — F419 Anxiety disorder, unspecified: Secondary | ICD-10-CM | POA: Diagnosis present

## 2015-05-16 DIAGNOSIS — K5792 Diverticulitis of intestine, part unspecified, without perforation or abscess without bleeding: Secondary | ICD-10-CM

## 2015-05-16 DIAGNOSIS — H409 Unspecified glaucoma: Secondary | ICD-10-CM | POA: Diagnosis present

## 2015-05-16 DIAGNOSIS — I10 Essential (primary) hypertension: Secondary | ICD-10-CM | POA: Diagnosis present

## 2015-05-16 DIAGNOSIS — E039 Hypothyroidism, unspecified: Secondary | ICD-10-CM | POA: Diagnosis present

## 2015-05-16 DIAGNOSIS — R197 Diarrhea, unspecified: Secondary | ICD-10-CM

## 2015-05-16 DIAGNOSIS — E785 Hyperlipidemia, unspecified: Secondary | ICD-10-CM | POA: Diagnosis present

## 2015-05-16 DIAGNOSIS — Z7982 Long term (current) use of aspirin: Secondary | ICD-10-CM

## 2015-05-16 DIAGNOSIS — M1990 Unspecified osteoarthritis, unspecified site: Secondary | ICD-10-CM | POA: Diagnosis present

## 2015-05-16 DIAGNOSIS — M797 Fibromyalgia: Secondary | ICD-10-CM | POA: Diagnosis present

## 2015-05-16 HISTORY — DX: Diverticulitis of intestine, part unspecified, without perforation or abscess without bleeding: K57.92

## 2015-05-16 LAB — COMPREHENSIVE METABOLIC PANEL WITH GFR
ALT: 47 U/L (ref 14–54)
AST: 26 U/L (ref 15–41)
Albumin: 3.6 g/dL (ref 3.5–5.0)
Alkaline Phosphatase: 156 U/L — ABNORMAL HIGH (ref 38–126)
Anion gap: 8 (ref 5–15)
BUN: 9 mg/dL (ref 6–20)
CO2: 27 mmol/L (ref 22–32)
Calcium: 9.1 mg/dL (ref 8.9–10.3)
Chloride: 100 mmol/L — ABNORMAL LOW (ref 101–111)
Creatinine, Ser: 0.99 mg/dL (ref 0.44–1.00)
GFR calc Af Amer: >60 mL/min
GFR calc non Af Amer: 57 mL/min — ABNORMAL LOW
Glucose, Bld: 121 mg/dL — ABNORMAL HIGH (ref 65–99)
Potassium: 3.6 mmol/L (ref 3.5–5.1)
Sodium: 135 mmol/L (ref 135–145)
Total Bilirubin: 0.6 mg/dL (ref 0.3–1.2)
Total Protein: 7.3 g/dL (ref 6.5–8.1)

## 2015-05-16 LAB — URINE MICROSCOPIC-ADD ON

## 2015-05-16 LAB — URINALYSIS, ROUTINE W REFLEX MICROSCOPIC
GLUCOSE, UA: NEGATIVE mg/dL
Ketones, ur: 15 mg/dL — AB
Leukocytes, UA: NEGATIVE
Nitrite: NEGATIVE
PH: 5.5 (ref 5.0–8.0)
PROTEIN: NEGATIVE mg/dL
SPECIFIC GRAVITY, URINE: 1.022 (ref 1.005–1.030)

## 2015-05-16 LAB — LIPASE, BLOOD: Lipase: 25 U/L (ref 11–51)

## 2015-05-16 LAB — CBC WITH DIFFERENTIAL/PLATELET
Basophils Absolute: 0 K/uL (ref 0.0–0.1)
Basophils Relative: 0 %
Eosinophils Absolute: 0.3 K/uL (ref 0.0–0.7)
Eosinophils Relative: 2 %
HCT: 36.3 % (ref 36.0–46.0)
Hemoglobin: 12.2 g/dL (ref 12.0–15.0)
Lymphocytes Relative: 20 %
Lymphs Abs: 2.7 K/uL (ref 0.7–4.0)
MCH: 29.5 pg (ref 26.0–34.0)
MCHC: 33.6 g/dL (ref 30.0–36.0)
MCV: 87.9 fL (ref 78.0–100.0)
Monocytes Absolute: 1.8 K/uL — ABNORMAL HIGH (ref 0.1–1.0)
Monocytes Relative: 13 %
Neutro Abs: 8.5 K/uL — ABNORMAL HIGH (ref 1.7–7.7)
Neutrophils Relative %: 65 %
Platelets: 323 K/uL (ref 150–400)
RBC: 4.13 MIL/uL (ref 3.87–5.11)
RDW: 14.1 % (ref 11.5–15.5)
WBC: 13.3 K/uL — ABNORMAL HIGH (ref 4.0–10.5)

## 2015-05-16 LAB — TROPONIN I: Troponin I: <0.03 ng/mL

## 2015-05-16 MED ORDER — ONDANSETRON HCL 4 MG/2ML IJ SOLN
4.0000 mg | Freq: Once | INTRAMUSCULAR | Status: AC
  Administered 2015-05-16: 4 mg via INTRAVENOUS
  Filled 2015-05-16: qty 2

## 2015-05-16 MED ORDER — ONDANSETRON HCL 4 MG PO TABS: 4.0000 mg | ORAL_TABLET | Freq: Four times a day (QID) | ORAL | Status: DC | PRN

## 2015-05-16 MED ORDER — MORPHINE SULFATE (PF) 2 MG/ML IV SOLN
INTRAVENOUS | Status: AC
  Filled 2015-05-16: qty 1

## 2015-05-16 MED ORDER — MORPHINE SULFATE (PF) 2 MG/ML IV SOLN
1.0000 mg | INTRAVENOUS | Status: DC | PRN
  Administered 2015-05-16 – 2015-05-17 (×3): 1 mg via INTRAVENOUS
  Filled 2015-05-16 (×2): qty 1

## 2015-05-16 MED ORDER — CLONAZEPAM 0.5 MG PO TABS
0.5000 mg | ORAL_TABLET | Freq: Two times a day (BID) | ORAL | Status: DC | PRN
  Administered 2015-05-16 – 2015-05-21 (×7): 0.5 mg via ORAL
  Filled 2015-05-16 (×7): qty 1

## 2015-05-16 MED ORDER — SODIUM CHLORIDE 0.9 % IV SOLN: INTRAVENOUS | Status: DC

## 2015-05-16 MED ORDER — HYDROCORTISONE 2.5 % RE CREA
1.0000 "application " | TOPICAL_CREAM | Freq: Two times a day (BID) | RECTAL | Status: DC | PRN
  Filled 2015-05-16: qty 28.35

## 2015-05-16 MED ORDER — IOHEXOL 300 MG/ML  SOLN
25.0000 mL | Freq: Once | INTRAMUSCULAR | Status: AC | PRN
  Administered 2015-05-16: 50 mL via ORAL

## 2015-05-16 MED ORDER — ACETAMINOPHEN 325 MG PO TABS
650.0000 mg | ORAL_TABLET | Freq: Four times a day (QID) | ORAL | Status: DC | PRN
  Administered 2015-05-19 – 2015-05-20 (×3): 650 mg via ORAL
  Filled 2015-05-16 (×3): qty 2

## 2015-05-16 MED ORDER — METOPROLOL TARTRATE 50 MG PO TABS
50.0000 mg | ORAL_TABLET | Freq: Two times a day (BID) | ORAL | Status: DC
  Administered 2015-05-16 – 2015-05-21 (×9): 50 mg via ORAL
  Filled 2015-05-16 (×10): qty 1

## 2015-05-16 MED ORDER — IOHEXOL 300 MG/ML  SOLN
100.0000 mL | Freq: Once | INTRAMUSCULAR | Status: AC | PRN
Start: 2015-05-16 — End: 2015-05-16
  Administered 2015-05-16: 100 mL via INTRAVENOUS

## 2015-05-16 MED ORDER — SIMETHICONE 80 MG PO CHEW
80.0000 mg | CHEWABLE_TABLET | Freq: Four times a day (QID) | ORAL | Status: DC | PRN
Start: 2015-05-16 — End: 2015-05-21

## 2015-05-16 MED ORDER — LATANOPROST 0.005 % OP SOLN
1.0000 [drp] | Freq: Every day | OPHTHALMIC | Status: DC
  Administered 2015-05-16 – 2015-05-20 (×5): 1 [drp] via OPHTHALMIC
  Filled 2015-05-16: qty 2.5

## 2015-05-16 MED ORDER — CIPROFLOXACIN IN D5W 400 MG/200ML IV SOLN
400.0000 mg | Freq: Two times a day (BID) | INTRAVENOUS | Status: DC
Start: 2015-05-16 — End: 2015-05-21
  Administered 2015-05-16 – 2015-05-21 (×10): 400 mg via INTRAVENOUS
  Filled 2015-05-16 (×12): qty 200

## 2015-05-16 MED ORDER — METRONIDAZOLE IN NACL 5-0.79 MG/ML-% IV SOLN
500.0000 mg | Freq: Three times a day (TID) | INTRAVENOUS | Status: DC
  Administered 2015-05-16 – 2015-05-21 (×14): 500 mg via INTRAVENOUS
  Filled 2015-05-16 (×17): qty 100

## 2015-05-16 MED ORDER — PANTOPRAZOLE SODIUM 40 MG IV SOLR
40.0000 mg | INTRAVENOUS | Status: DC
Start: 2015-05-16 — End: 2015-05-21
  Administered 2015-05-16 – 2015-05-20 (×5): 40 mg via INTRAVENOUS
  Filled 2015-05-16 (×5): qty 40

## 2015-05-16 MED ORDER — MORPHINE SULFATE (PF) 2 MG/ML IV SOLN
2.0000 mg | Freq: Once | INTRAVENOUS | Status: AC
  Administered 2015-05-16: 2 mg via INTRAVENOUS
  Filled 2015-05-16: qty 1

## 2015-05-16 MED ORDER — SODIUM CHLORIDE 0.9 % IV BOLUS (SEPSIS)
1000.0000 mL | Freq: Once | INTRAVENOUS | Status: AC
  Administered 2015-05-16: 1000 mL via INTRAVENOUS

## 2015-05-16 MED ORDER — DEXTROSE-NACL 5-0.45 % IV SOLN
INTRAVENOUS | Status: DC
  Administered 2015-05-16 – 2015-05-19 (×7): via INTRAVENOUS
  Administered 2015-05-20: 100 mL via INTRAVENOUS
  Administered 2015-05-20: 1 mL via INTRAVENOUS

## 2015-05-16 MED ORDER — ONDANSETRON HCL 4 MG/2ML IJ SOLN: 4.0000 mg | Freq: Four times a day (QID) | INTRAMUSCULAR | Status: DC | PRN

## 2015-05-16 MED ORDER — METRONIDAZOLE IN NACL 5-0.79 MG/ML-% IV SOLN
500.0000 mg | Freq: Once | INTRAVENOUS | Status: AC
  Administered 2015-05-16: 500 mg via INTRAVENOUS
  Filled 2015-05-16: qty 100

## 2015-05-16 MED ORDER — LEVOTHYROXINE SODIUM 25 MCG PO TABS
25.0000 ug | ORAL_TABLET | Freq: Every day | ORAL | Status: DC
  Administered 2015-05-17 – 2015-05-21 (×5): 25 ug via ORAL
  Filled 2015-05-16 (×6): qty 1

## 2015-05-16 MED ORDER — ACETAMINOPHEN 650 MG RE SUPP: 650.0000 mg | Freq: Four times a day (QID) | RECTAL | Status: DC | PRN

## 2015-05-16 MED ORDER — CIPROFLOXACIN IN D5W 400 MG/200ML IV SOLN
400.0000 mg | Freq: Once | INTRAVENOUS | Status: AC
  Administered 2015-05-16: 400 mg via INTRAVENOUS
  Filled 2015-05-16: qty 200

## 2015-05-16 NOTE — H&P (Signed)
[facility] History and Physical  JESLYNN HOLLANDER WUJ:[id number] DOB: 06/20/1945 DOA: 05/16/2015  Referring physician: Dr Ranae Palms PCP: Myrlene Broker, MD   Chief Complaint: Abdominal pain and diarrhea  HPI: Angela [name] is a 70 y.o. female with PMH IBS, Diverticulosis who presents complaining of abdominal pain that started 2 days prior to admission. This is new pain, different from her IBS symptoms. She report pain as sharp, worse with movement. She also report Diarrhea for 1 week. She had stool culture done at her Primary GI Doctor. Stool culture negative. She presents to [facility] with worsening abdominal pain, and fever. She report bright blood on tissue paper, from her hemorrhoids. She has had multiple BM.   She denies nausea, vomiting, chest pain or dyspnea.  Evaluation in the ED; CT with evidence of sigmoid diverticulitis. WBC at 13.   Review of Systems:  Negative, except as per HPI  Past Medical History  Diagnosis Date  . Hypertension   . Hyperlipidemia   . Hypothyroidism   . Hiatal hernia 04/25/2005  . GERD (gastroesophageal reflux disease) 09/13/2007  . Diverticulosis 05/10/2004  . IBS (irritable bowel syndrome)   . Colon polyps   . Osteoarthritis   . Fibromyalgia   . Vitamin D deficiency   . Migraine   . Anxiety   . Acne rosacea   . Bile salt-induced diarrhea   . Gastroparesis   . External hemorrhoids 05/10/2004  . Esophagitis 04/25/2005  . Duodenitis 04/25/2005  . Esophageal stricture 09/13/2007  . Sessile colonic polyp 05/30/2009  . Degenerative disc disease   . Vertigo   . 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 Other and unspecified noninfectious gastroenteritis and colitis(558.9)   . Glaucoma   . Diverticulitis 05/2015   Past Surgical History  Procedure Laterality Date  . Cholecystectomy  1985  . Vaginal hysterectomy  1993  . Cesarean section    . Foot osteotomy  2006    Right foot  . Tubal ligation   1983  . Bunionectomy  09/04/2011    left  . Colonoscopy     Social History:  reports that she has never smoked. She has never used smokeless tobacco. She reports that she does not drink alcohol or use illicit drugs.  Allergies  Allergen Reactions  . Tetracycline Other (See Comments)    GI bleed  . Codeine Nausea And Vomiting    GI upset/vomit  . Paroxetine Rash    Family History  Problem Relation Age of Onset  . Mitral valve prolapse Mother   . Hypertension Father   . Colon polyps Father   . Stroke Father   . Hypertension Sister     x2  . Diabetes Sister   . Colon cancer Maternal Aunt   . Stomach cancer Maternal Aunt   . Colon polyps Sister     Prior to Admission medications   Medication Sig Start Date End Date Taking? Authorizing Provider  AMBULATORY NON FORMULARY MEDICATION Lidocaine HCL Isotonic (45ml) 4% Nasal Spray As needed    Historical Provider, MD  aspirin 81 MG tablet Take 81 mg by mouth daily.      Historical Provider, MD  celecoxib (CELEBREX) 200 MG capsule TAKE 1 CAPSULE BY MOUTH EVERY DAY 03/12/15   Myrlene Broker, MD  cholestyramine Lanetta Inch) 4 GM/DOSE powder Take 1 packet (4 g total) by mouth daily. 10/17/14   Rachael Fee, MD  clonazePAM (KLONOPIN) 0.5 MG tablet 1 po  bid as needed Patient not taking: Reported on 03/27/2015 09/15/14   Pecola Lawless, MD  HYDROcodone-acetaminophen (NORCO/VICODIN) 5-325 MG per tablet Take 1 tablet by mouth 3 (three) times daily as needed. Patient not taking: Reported on 03/27/2015 06/30/13   Michele Mcalpine, MD  hydrocortisone (ANUSOL-HC) 2.5 % rectal cream Place 1 application rectally 2 (two) times daily. Patient not taking: Reported on 03/27/2015 02/13/15   Princella Pellegrini Zehr, PA-C  hydrocortisone (ANUSOL-HC) 25 MG suppository Place 1 suppository (25 mg total) rectally daily as needed for hemorrhoids or itching. Patient not taking: Reported on 03/27/2015 01/01/15   Rachael Fee, MD  latanoprost (XALATAN) 0.005 %  ophthalmic solution Place 1 drop into both eyes at bedtime.    Historical Provider, MD  levothyroxine (SYNTHROID, LEVOTHROID) 50 MCG tablet Take 1/2 tablet by mouth daily 08/02/14   Myrlene Broker, MD  metoprolol (LOPRESSOR) 50 MG tablet Take 1 tablet (50 mg total) by mouth 2 (two) times daily. APPOINTMENT NEEDED FOR FUTURE REFILLS 04/30/15   Lewayne Bunting, MD  metroNIDAZOLE (FLAGYL) 500 MG tablet Take 1 tablet (500 mg total) by mouth 2 (two) times daily. Patient not taking: Reported on 03/27/2015 02/13/15   Jessica D Zehr, PA-C  NEXIUM 20 MG capsule TAKE 2 CAPSULES BY MOUTH EVERY DAY AT NOON**PA REQ** 07/17/14   Amy S Esterwood, PA-C  Sulfacetamide Sodium (OVACE PLUS EX) Apply topically as needed.    Historical Provider, MD  traMADol (ULTRAM) 50 MG tablet Take 1 tablet (50 mg total) by mouth 3 (three) times daily as needed (arthritis pain). 06/30/13   Michele Mcalpine, MD  Triamcinolone Acetonide (NASACORT AQ NA) Place into the nose as needed.     Historical Provider, MD  triamcinolone ointment (KENALOG) 0.1 % Apply 1 application topically 2 (two) times daily.  05/02/14   Historical Provider, MD  Vitamin D, Ergocalciferol, (DRISDOL) 50000 UNITS CAPS capsule TAKE 1 CAPSULE (50,000 UNITS TOTAL) BY MOUTH ONCE A WEEK. 01/25/15   Myrlene Broker, MD   Physical Exam: Filed Vitals:   05/16/15 1500 05/16/15 1530 05/16/15 1602 05/16/15 1708  BP: 124/63 116/58 117/56 117/61  Pulse: 101 88 97 90  Temp:   98.9 F (37.2 C) 99.5 F (37.5 C)  TempSrc:   Oral Oral  Resp:   18 18  Height:      Weight:      SpO2: 97% 95% 98% 94%    Wt Readings from Last 3 Encounters:  05/16/15 68.04 kg (150 lb)  03/27/15 73.936 kg (163 lb)  02/13/15 73.143 kg (161 lb 4 oz)    General:  Appears calm and comfortable Eyes: PERRL, normal lids, irises & conjunctiva ENT: grossly normal hearing, lips & tongue Neck: no LAD, masses or thyromegaly Cardiovascular: RRR, no m/r/g. No LE edema. Telemetry: SR, no  arrhythmias  Respiratory: CTA bilaterally, no w/r/r. Normal respiratory effort. Abdomen: soft, mild left Lower quadrant tenderness.  Skin: no rash or induration seen on limited exam Musculoskeletal: grossly normal tone BUE/BLE Psychiatric: grossly normal mood and affect, speech fluent and appropriate Neurologic: grossly non-focal.          Labs on Admission:  Basic Metabolic Panel:  Recent Labs Lab 05/16/15 1210  NA 135  K 3.6  CL 100*  CO2 27  GLUCOSE 121*  BUN 9  CREATININE 0.99  CALCIUM 9.1   Liver Function Tests:  Recent Labs Lab 05/16/15 1210  AST 26  ALT 47  ALKPHOS 156*  BILITOT 0.6  PROT  7.3  ALBUMIN 3.6    Recent Labs Lab 05/16/15 1210  LIPASE 25   No results for input(s): AMMONIA in the last 168 hours. CBC:  Recent Labs Lab 05/16/15 1210  WBC 13.3*  NEUTROABS 8.5*  HGB 12.2  HCT 36.3  MCV 87.9  PLT 323   Cardiac Enzymes:  Recent Labs Lab 05/16/15 1210  TROPONINI <0.03    BNP (last 3 results) No results for input(s): BNP in the last 8760 hours.  ProBNP (last 3 results) No results for input(s): PROBNP in the last 8760 hours.  CBG: No results for input(s): GLUCAP in the last 168 hours.  Radiological Exams on Admission: Ct Abdomen Pelvis W Contrast  05/16/2015  CLINICAL DATA:  70 year old female with history of diarrhea for 1 week and left lower quadrant pain for 2 days. EXAM: CT ABDOMEN AND PELVIS WITH CONTRAST TECHNIQUE: Multidetector CT imaging of the abdomen and pelvis was performed using the standard protocol following bolus administration of intravenous contrast. CONTRAST:  OMNIPAQUE IOHEXOL 300 MG/ML SOLN, 50mL OMNIPAQUE IOHEXOL 300 MG/ML SOLN COMPARISON:  CT the abdomen and pelvis 01/05/2015. FINDINGS: Lower chest: Small calcified granuloma in the periphery of the right lower lobe. Hepatobiliary: No cystic or solid hepatic lesions. Status post cholecystectomy. Mild to moderate intrahepatic biliary ductal dilatation, similar  to the prior examination. Common bile duct measures 13 mm in the porta hepatis. No definite obstructive stone identified in the common bile duct. Pancreas: No pancreatic mass. No pancreatic ductal dilatation. No pancreatic or peripancreatic fluid or inflammatory changes. Spleen: Unremarkable. Adrenals/Urinary Tract: Sub cm low-attenuation lesion in the right kidney is too small to definitively characterize, but is unchanged, likely a tiny cyst. Left kidney and bilateral adrenal glands are normal in appearance. No hydroureteronephrosis. Urinary bladder is normal in appearance. Stomach/Bowel: Normal appearance of the stomach. No pathologic dilatation of small bowel or colon. There is extensive colonic diverticulosis. In the region of the proximal sigmoid colon there is extensive inflammation and mural thickening, presumably related to an acute diverticulitis. No discrete diverticular abscess is confidently identified at this time. No definite signs of frank perforation are identified. Extensive inflammatory changes in the fat of the sigmoid mesocolon are noted. Normal appendix. Vascular/Lymphatic: No significant atherosclerotic disease, aneurysm or dissection identified in the abdominal or pelvic vasculature. No lymphadenopathy noted in the abdomen or pelvis. Reproductive: Status post hysterectomy. Ovaries are not confidently identified may be surgically absent or atrophic. Other: No significant volume of ascites.  No pneumoperitoneum. Musculoskeletal: There are no aggressive appearing lytic or blastic lesions noted in the visualized portions of the skeleton. IMPRESSION: 1. Findings, as above, compatible with an acute diverticulitis in the proximal sigmoid colon. No definite diverticular abscess or findings of frank perforation are identified at this time. 2. Normal appendix. 3. Intra and extrahepatic biliary ductal dilatation which is only slightly more extensive than the prior study, favored to reflect benign post  cholecystectomy physiology. However, correlation with liver function tests is recommended to exclude the possibility of biliary tract obstruction. 4. Mild atherosclerosis. These results were called by telephone at the time of interpretation on 05/16/2015 at 2:10 pm to Dr. Loren Racer, who verbally acknowledged these results. Electronically Signed   By: Trudie Reed M.D.   On: 05/16/2015 14:10    EKG: Independently reviewed. Sinus rhythm.   Assessment/Plan Active Problems:   Diverticulitis   1-Acute sigmoid Diverticulitis:  Admit to med-surgery.  IV fluids.  IV ciprofloxacin and flagyl.  NPO, except for med.  Repeat  CBC in am.  Follow clinical improvement./   2-Hypothyroidism: continue with synthroid.   3-Anxiety; continue with klonopin.   4-Glaucoma; continue with Xalatan   5-Intra and extrahepatic biliary ductal dilatation which is only slightly more extensive than the prior study, favored to reflect benign post cholecystectomy physiology; mild elevated Alkaline phosphatase. Repeat labs in am. No Right upper quadrant pain.   GERD; Protonix.    Code Status: full code.  DVT Prophylaxis:SCD. Risk for bleeding.  Family Communication: daughter and son at bedside.  Disposition Plan: expect 2 to 3 days of IV antibiotics.   Time spent: 75 minutes.   Hartley Barefoot A [facility] Pager [phone]

## 2015-05-16 NOTE — Progress Notes (Signed)
Angela [name] is a 70 y.o. female patient admitted from ED awake, alert - oriented  X 4 - no acute distress noted.  VSS - Blood pressure 117/61, pulse 90, temperature 99.5 F (37.5 C), temperature source Oral, resp. rate 18, height  (1.549 m), weight 68.04 kg (150 lb), SpO2 94 %.    IV in place, occlusive dsg intact without redness.  Orientation to room, and floor completed with information packet given to patient/family.  Admission INP armband ID verified with patient/family, and in place.   SR up x 2, fall assessment complete, with patient and family able to verbalize understanding of risk associated with falls, and verbalized understanding to call nsg before up out of bed.  Call light within reach, patient able to voice, and demonstrate understanding.  Skin, clean-dry- intact without evidence of bruising, or skin tears.   No evidence of skin break down noted on exam.  MD notified patient is on the unit and requesting pain meds.      Will cont to eval and treat per MD orders.  Alonza Bogus, RN 05/16/2015 6:00 PM

## 2015-05-16 NOTE — ED Notes (Signed)
Diarrhea since lat Thursday-took stool sample to GI on Monday with no known results-c/o having abd pain-PCP would not give meds until stool results

## 2015-05-16 NOTE — ED Provider Notes (Signed)
CSN: [id number]     Arrival date & time 05/16/15  1113 History   First MD Initiated Contact with Patient 05/16/15 1129     Chief Complaint  Patient presents with  . Diarrhea     (Consider location/radiation/quality/duration/timing/severity/associated sxs/prior Treatment) HPI Patient presents with one week of loose stools. She states she's been having abdominal cramping this relieved after having a bowel movement. She states she had subjective fever this morning. Was seen by her gastric neurologist several days ago and took in a stool sample. She now is having 2 days of constant left lower quadrant pain. This is worse with movement. No nausea or vomiting. Patient does have a history of diverticulosis. She is followed by lower gastroenterology. Denies any urinary symptoms. States she is still tolerating liquids.  Past Medical History  Diagnosis Date  . Hypertension   . Hyperlipidemia   . Hypothyroidism   . Hiatal hernia 04/25/2005  . GERD (gastroesophageal reflux disease) 09/13/2007  . Diverticulosis 05/10/2004  . IBS (irritable bowel syndrome)   . Colon polyps   . Osteoarthritis   . Fibromyalgia   . Vitamin D deficiency   . Migraine   . Anxiety   . Acne rosacea   . Bile salt-induced diarrhea   . Gastroparesis   . External hemorrhoids 05/10/2004  . Esophagitis 04/25/2005  . Duodenitis 04/25/2005  . Esophageal stricture 09/13/2007  . Sessile colonic polyp 05/30/2009  . Degenerative disc disease   . Vertigo   . 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 Other and unspecified noninfectious gastroenteritis and colitis(558.9)   . Glaucoma    Past Surgical History  Procedure Laterality Date  . Cholecystectomy  1985  . Vaginal hysterectomy  1993  . Cesarean section    . Foot osteotomy  2006    Right foot  . Tubal ligation  1983  . Bunionectomy  09/04/2011    left   Family History  Problem Relation Age of Onset  . Mitral valve prolapse Mother   . Hypertension  Father   . Colon polyps Father   . Stroke Father   . Hypertension Sister     x2  . Diabetes Sister   . Colon cancer Maternal Aunt   . Stomach cancer Maternal Aunt   . Colon polyps Sister    Social History  Substance Use Topics  . Smoking status: Never Smoker   . Smokeless tobacco: Never Used  . Alcohol Use: No   OB History    No data available     Review of Systems  Constitutional: Positive for fever and fatigue.  Respiratory: Negative for shortness of breath.   Cardiovascular: Negative for chest pain.  Gastrointestinal: Positive for abdominal pain and diarrhea. Negative for nausea, vomiting and blood in stool.  Genitourinary: Negative for dysuria, frequency, hematuria and flank pain.  Musculoskeletal: Negative for back pain, neck pain and neck stiffness.  Skin: Negative for rash and wound.  Neurological: Negative for dizziness, weakness, light-headedness, numbness and headaches.  All other systems reviewed and are negative.     Allergies  Tetracycline; Codeine; and Paroxetine  Home Medications   Prior to Admission medications   Medication Sig Start Date End Date Taking? Authorizing Provider  AMBULATORY NON FORMULARY MEDICATION Lidocaine HCL Isotonic (45ml) 4% Nasal Spray As needed    Historical Provider, MD  aspirin 81 MG tablet Take 81 mg by mouth daily.      Historical Provider, MD  celecoxib (CELEBREX) 200  MG capsule TAKE 1 CAPSULE BY MOUTH EVERY DAY 03/12/15   Myrlene Broker, MD  cholestyramine Lanetta Inch) 4 GM/DOSE powder Take 1 packet (4 g total) by mouth daily. 10/17/14   Rachael Fee, MD  clonazePAM Scarlette Calico) 0.5 MG tablet 1 po bid as needed Patient not taking: Reported on 03/27/2015 09/15/14   Pecola Lawless, MD  HYDROcodone-acetaminophen (NORCO/VICODIN) 5-325 MG per tablet Take 1 tablet by mouth 3 (three) times daily as needed. Patient not taking: Reported on 03/27/2015 06/30/13   Michele Mcalpine, MD  hydrocortisone (ANUSOL-HC) 2.5 % rectal cream Place  1 application rectally 2 (two) times daily. Patient not taking: Reported on 03/27/2015 02/13/15   Princella Pellegrini Zehr, PA-C  hydrocortisone (ANUSOL-HC) 25 MG suppository Place 1 suppository (25 mg total) rectally daily as needed for hemorrhoids or itching. Patient not taking: Reported on 03/27/2015 01/01/15   Rachael Fee, MD  latanoprost (XALATAN) 0.005 % ophthalmic solution Place 1 drop into both eyes at bedtime.    Historical Provider, MD  levothyroxine (SYNTHROID, LEVOTHROID) 50 MCG tablet Take 1/2 tablet by mouth daily 08/02/14   Myrlene Broker, MD  metoprolol (LOPRESSOR) 50 MG tablet Take 1 tablet (50 mg total) by mouth 2 (two) times daily. APPOINTMENT NEEDED FOR FUTURE REFILLS 04/30/15   Lewayne Bunting, MD  metroNIDAZOLE (FLAGYL) 500 MG tablet Take 1 tablet (500 mg total) by mouth 2 (two) times daily. Patient not taking: Reported on 03/27/2015 02/13/15   Jessica D Zehr, PA-C  NEXIUM 20 MG capsule TAKE 2 CAPSULES BY MOUTH EVERY DAY AT NOON**PA REQ** 07/17/14   Amy S Esterwood, PA-C  Sulfacetamide Sodium (OVACE PLUS EX) Apply topically as needed.    Historical Provider, MD  traMADol (ULTRAM) 50 MG tablet Take 1 tablet (50 mg total) by mouth 3 (three) times daily as needed (arthritis pain). 06/30/13   Michele Mcalpine, MD  Triamcinolone Acetonide (NASACORT AQ NA) Place into the nose as needed.     Historical Provider, MD  triamcinolone ointment (KENALOG) 0.1 % Apply 1 application topically 2 (two) times daily.  05/02/14   Historical Provider, MD  Vitamin D, Ergocalciferol, (DRISDOL) 50000 UNITS CAPS capsule TAKE 1 CAPSULE (50,000 UNITS TOTAL) BY MOUTH ONCE A WEEK. 01/25/15   Myrlene Broker, MD   BP 116/68 mmHg  Pulse 95  Temp(Src) 99.2 F (37.3 C) (Oral)  Resp 18  Ht  (1.549 m)  Wt 150 lb (68.04 kg)  BMI 28.36 kg/m2  SpO2 95% Physical Exam  Constitutional: She is oriented to person, place, and time. She appears well-developed and well-nourished. No distress.  HENT:  Head:  Normocephalic and atraumatic.  Mouth/Throat: Oropharynx is clear and moist. No oropharyngeal exudate.  Eyes: EOM are normal. Pupils are equal, round, and reactive to light.  Neck: Normal range of motion. Neck supple.  Cardiovascular: Normal rate and regular rhythm.   Pulmonary/Chest: Effort normal and breath sounds normal. No respiratory distress. She has no wheezes. She has no rales.  Abdominal: Soft. Bowel sounds are normal. She exhibits no distension and no mass. There is tenderness. There is no rebound and no guarding.  Patient with generalized abdominal tenderness but especially worse in the left lower quadrant. No definite rebound or guarding.  Musculoskeletal: Normal range of motion. She exhibits no edema or tenderness.  No CVA tenderness bilaterally.  Neurological: She is alert and oriented to person, place, and time.  Moves all extremities without deficit. Sensation is fully intact.  Skin: Skin is warm  and dry. No rash noted. No erythema.  Psychiatric: She has a normal mood and affect. Her behavior is normal.  Nursing note and vitals reviewed.   ED Course  Procedures (including critical care time) Labs Review Labs Reviewed  CBC WITH DIFFERENTIAL/PLATELET - Abnormal; Notable for the following:    WBC 13.3 (*)    Neutro Abs 8.5 (*)    Monocytes Absolute 1.8 (*)    All other components within normal limits  COMPREHENSIVE METABOLIC PANEL - Abnormal; Notable for the following:    Chloride 100 (*)    Glucose, Bld 121 (*)    Alkaline Phosphatase 156 (*)    GFR calc non Af Amer 57 (*)    All other components within normal limits  URINALYSIS, ROUTINE W REFLEX MICROSCOPIC (NOT AT [facility]) - Abnormal; Notable for the following:    Color, Urine AMBER (*)    Hgb urine dipstick MODERATE (*)    Bilirubin Urine SMALL (*)    Ketones, ur 15 (*)    All other components within normal limits  URINE MICROSCOPIC-ADD ON - Abnormal; Notable for the following:    Squamous Epithelial / LPF 0-5 (*)     Bacteria, UA RARE (*)    All other components within normal limits  LIPASE, BLOOD  TROPONIN I    Imaging Review Ct Abdomen Pelvis W Contrast  05/16/2015  CLINICAL DATA:  70 year old female with history of diarrhea for 1 week and left lower quadrant pain for 2 days. EXAM: CT ABDOMEN AND PELVIS WITH CONTRAST TECHNIQUE: Multidetector CT imaging of the abdomen and pelvis was performed using the standard protocol following bolus administration of intravenous contrast. CONTRAST:  OMNIPAQUE IOHEXOL 300 MG/ML SOLN, 50mL OMNIPAQUE IOHEXOL 300 MG/ML SOLN COMPARISON:  CT the abdomen and pelvis 01/05/2015. FINDINGS: Lower chest: Small calcified granuloma in the periphery of the right lower lobe. Hepatobiliary: No cystic or solid hepatic lesions. Status post cholecystectomy. Mild to moderate intrahepatic biliary ductal dilatation, similar to the prior examination. Common bile duct measures 13 mm in the porta hepatis. No definite obstructive stone identified in the common bile duct. Pancreas: No pancreatic mass. No pancreatic ductal dilatation. No pancreatic or peripancreatic fluid or inflammatory changes. Spleen: Unremarkable. Adrenals/Urinary Tract: Sub cm low-attenuation lesion in the right kidney is too small to definitively characterize, but is unchanged, likely a tiny cyst. Left kidney and bilateral adrenal glands are normal in appearance. No hydroureteronephrosis. Urinary bladder is normal in appearance. Stomach/Bowel: Normal appearance of the stomach. No pathologic dilatation of small bowel or colon. There is extensive colonic diverticulosis. In the region of the proximal sigmoid colon there is extensive inflammation and mural thickening, presumably related to an acute diverticulitis. No discrete diverticular abscess is confidently identified at this time. No definite signs of frank perforation are identified. Extensive inflammatory changes in the fat of the sigmoid mesocolon are noted. Normal appendix.  Vascular/Lymphatic: No significant atherosclerotic disease, aneurysm or dissection identified in the abdominal or pelvic vasculature. No lymphadenopathy noted in the abdomen or pelvis. Reproductive: Status post hysterectomy. Ovaries are not confidently identified may be surgically absent or atrophic. Other: No significant volume of ascites.  No pneumoperitoneum. Musculoskeletal: There are no aggressive appearing lytic or blastic lesions noted in the visualized portions of the skeleton. IMPRESSION: 1. Findings, as above, compatible with an acute diverticulitis in the proximal sigmoid colon. No definite diverticular abscess or findings of frank perforation are identified at this time. 2. Normal appendix. 3. Intra and extrahepatic biliary ductal dilatation which is  only slightly more extensive than the prior study, favored to reflect benign post cholecystectomy physiology. However, correlation with liver function tests is recommended to exclude the possibility of biliary tract obstruction. 4. Mild atherosclerosis. These results were called by telephone at the time of interpretation on 05/16/2015 at 2:10 pm to Dr. Loren Racer, who verbally acknowledged these results. Electronically Signed   By: Trudie Reed M.D.   On: 05/16/2015 14:10   I have personally reviewed and evaluated these images and lab results as part of my medical decision-making.   EKG Interpretation None      MDM   Final diagnoses:  Diverticulitis of large intestine without perforation or abscess without bleeding  Diarrhea, unspecified type   Discussed with Dr. Margot Ables. Will accept in transfer to MedSurg observation bed at [facility]. IV antibiotic initiated in the emergency department.     Loren Racer, MD 05/16/15 1501

## 2015-05-16 NOTE — Progress Notes (Signed)
Patient being admitted to [facility] from [facility] due to intractable pain from diverticulitis. Patient hemodynamically stable. MedSurg observation admission requested   Shelly Flatten, MD [facility] 05/16/2015, 3:01 PM

## 2015-05-16 NOTE — Telephone Encounter (Signed)
Pt was seen in the ER at [facility].

## 2015-05-17 DIAGNOSIS — I10 Essential (primary) hypertension: Secondary | ICD-10-CM

## 2015-05-17 DIAGNOSIS — M797 Fibromyalgia: Secondary | ICD-10-CM | POA: Diagnosis present

## 2015-05-17 DIAGNOSIS — E039 Hypothyroidism, unspecified: Secondary | ICD-10-CM

## 2015-05-17 DIAGNOSIS — H409 Unspecified glaucoma: Secondary | ICD-10-CM | POA: Diagnosis present

## 2015-05-17 DIAGNOSIS — K219 Gastro-esophageal reflux disease without esophagitis: Secondary | ICD-10-CM | POA: Diagnosis present

## 2015-05-17 DIAGNOSIS — K5732 Diverticulitis of large intestine without perforation or abscess without bleeding: Secondary | ICD-10-CM | POA: Diagnosis present

## 2015-05-17 DIAGNOSIS — Z7982 Long term (current) use of aspirin: Secondary | ICD-10-CM | POA: Diagnosis not present

## 2015-05-17 DIAGNOSIS — E785 Hyperlipidemia, unspecified: Secondary | ICD-10-CM | POA: Diagnosis present

## 2015-05-17 DIAGNOSIS — F419 Anxiety disorder, unspecified: Secondary | ICD-10-CM | POA: Diagnosis present

## 2015-05-17 DIAGNOSIS — M1990 Unspecified osteoarthritis, unspecified site: Secondary | ICD-10-CM | POA: Diagnosis present

## 2015-05-17 DIAGNOSIS — R197 Diarrhea, unspecified: Secondary | ICD-10-CM | POA: Diagnosis present

## 2015-05-17 LAB — COMPREHENSIVE METABOLIC PANEL
ALBUMIN: 3 g/dL — AB (ref 3.5–5.0)
ALT: 52 U/L (ref 14–54)
AST: 51 U/L — AB (ref 15–41)
Alkaline Phosphatase: 141 U/L — ABNORMAL HIGH (ref 38–126)
Anion gap: 10 (ref 5–15)
CHLORIDE: 102 mmol/L (ref 101–111)
CO2: 29 mmol/L (ref 22–32)
CREATININE: 1.02 mg/dL — AB (ref 0.44–1.00)
Calcium: 9.1 mg/dL (ref 8.9–10.3)
GFR calc Af Amer: >60 mL/min (ref >60)
GFR calc non Af Amer: 55 mL/min — ABNORMAL LOW (ref >60)
GLUCOSE: 98 mg/dL (ref 65–99)
POTASSIUM: 3.7 mmol/L (ref 3.5–5.1)
SODIUM: 141 mmol/L (ref 135–145)
Total Bilirubin: 0.6 mg/dL (ref 0.3–1.2)
Total Protein: 6.3 g/dL — ABNORMAL LOW (ref 6.5–8.1)

## 2015-05-17 LAB — CBC
HEMATOCRIT: 33.7 % — AB (ref 36.0–46.0)
Hemoglobin: 11.4 g/dL — ABNORMAL LOW (ref 12.0–15.0)
MCH: 30.3 pg (ref 26.0–34.0)
MCHC: 33.8 g/dL (ref 30.0–36.0)
MCV: 89.6 fL (ref 78.0–100.0)
PLATELETS: 298 10*3/uL (ref 150–400)
RBC: 3.76 MIL/uL — ABNORMAL LOW (ref 3.87–5.11)
RDW: 14.5 % (ref 11.5–15.5)
WBC: 10 10*3/uL (ref 4.0–10.5)

## 2015-05-17 NOTE — Progress Notes (Signed)
[facility] PROGRESS NOTE   Angela [name] ZOX:[id number] DOB: 1945/05/02 DOA: 05/16/2015 PCP: Myrlene Broker, MD  HPI/Subjective: Feels okay, has left-sided abdominal pain but denies any nausea or vomiting.  Assessment/Plan: Active Problems:   Hypothyroidism   Essential hypertension   Diverticulitis   Acute sigmoid Diverticulitis:  Presented with subjective fever, gallops Q abdominal pain and diarrhea. CT scan showed acute sigmoid diverticulitis without evidence of perforation. Started on IV Cipro/Metro, continue antibiotics. On clear liquids, continue and advance as tolerated.  Hypothyroidism: Continue with synthroid.   Anxiety; Continue with klonopin.   Glaucoma; Continue with Xalatan   Intra and extrahepatic biliary ductal dilatation which is only slightly more extensive than the prior study, favored to reflect benign post cholecystectomy physiology; mild elevated Alkaline phosphatase.   GERD Continue Protonix.  Code Status: Full Code Family Communication: Plan discussed with the patient. Disposition Plan: Remains inpatient Diet: Diet NPO time specified Except for: Sips with Meds  Consultants:  None  Procedures:  None  Antibiotics:  Cipro/maternal   Objective: Filed Vitals:   05/17/15 1000 05/17/15 1400  BP: 107/49 100/55  Pulse: 67 67  Temp: 98.8 F (37.1 C) 98.6 F (37 C)  Resp: 18 20    Intake/Output Summary (Last 24 hours) at 05/17/15 1433 Last data filed at 05/17/15 0900  Gross per 24 hour  Intake 2113.33 ml  Output      0 ml  Net 2113.33 ml   Filed Weights   05/16/15 1117 05/16/15 1900  Weight: 68.04 kg (150 lb) 72.576 kg (160 lb)    Exam: General: Alert and awake, oriented x3, not in any acute distress. HEENT: anicteric sclera, pupils reactive to light and accommodation, EOMI CVS: S1-S2 clear, no murmur rubs or gallops Chest: clear to auscultation bilaterally, no wheezing, rales or rhonchi Abdomen: soft nontender,  nondistended, normal bowel sounds, no organomegaly Extremities: no cyanosis, clubbing or edema noted bilaterally Neuro: Cranial nerves II-XII intact, no focal neurological deficits  Data Reviewed: Basic Metabolic Panel:  Recent Labs Lab 05/16/15 1210 05/17/15 0656  NA 135 141  K 3.6 3.7  CL 100* 102  CO2 27 29  GLUCOSE 121* 98  BUN 9 <5*  CREATININE 0.99 1.02*  CALCIUM 9.1 9.1   Liver Function Tests:  Recent Labs Lab 05/16/15 1210 05/17/15 0656  AST 26 51*  ALT 47 52  ALKPHOS 156* 141*  BILITOT 0.6 0.6  PROT 7.3 6.3*  ALBUMIN 3.6 3.0*    Recent Labs Lab 05/16/15 1210  LIPASE 25   No results for input(s): AMMONIA in the last 168 hours. CBC:  Recent Labs Lab 05/16/15 1210 05/17/15 0656  WBC 13.3* 10.0  NEUTROABS 8.5*  --   HGB 12.2 11.4*  HCT 36.3 33.7*  MCV 87.9 89.6  PLT 323 298   Cardiac Enzymes:  Recent Labs Lab 05/16/15 1210  TROPONINI <0.03   BNP (last 3 results) No results for input(s): BNP in the last 8760 hours.  ProBNP (last 3 results) No results for input(s): PROBNP in the last 8760 hours.  CBG: No results for input(s): GLUCAP in the last 168 hours.  Micro No results found for this or any previous visit (from the past 240 hour(s)).   Studies: Ct Abdomen Pelvis W Contrast  05/16/2015  CLINICAL DATA:  70 year old female with history of diarrhea for 1 week and left lower quadrant pain for 2 days. EXAM: CT ABDOMEN AND PELVIS WITH CONTRAST TECHNIQUE: Multidetector CT imaging of the abdomen and pelvis was performed  using the standard protocol following bolus administration of intravenous contrast. CONTRAST:  OMNIPAQUE IOHEXOL 300 MG/ML SOLN, 50mL OMNIPAQUE IOHEXOL 300 MG/ML SOLN COMPARISON:  CT the abdomen and pelvis 01/05/2015. FINDINGS: Lower chest: Small calcified granuloma in the periphery of the right lower lobe. Hepatobiliary: No cystic or solid hepatic lesions. Status post cholecystectomy. Mild to moderate intrahepatic biliary  ductal dilatation, similar to the prior examination. Common bile duct measures 13 mm in the porta hepatis. No definite obstructive stone identified in the common bile duct. Pancreas: No pancreatic mass. No pancreatic ductal dilatation. No pancreatic or peripancreatic fluid or inflammatory changes. Spleen: Unremarkable. Adrenals/Urinary Tract: Sub cm low-attenuation lesion in the right kidney is too small to definitively characterize, but is unchanged, likely a tiny cyst. Left kidney and bilateral adrenal glands are normal in appearance. No hydroureteronephrosis. Urinary bladder is normal in appearance. Stomach/Bowel: Normal appearance of the stomach. No pathologic dilatation of small bowel or colon. There is extensive colonic diverticulosis. In the region of the proximal sigmoid colon there is extensive inflammation and mural thickening, presumably related to an acute diverticulitis. No discrete diverticular abscess is confidently identified at this time. No definite signs of frank perforation are identified. Extensive inflammatory changes in the fat of the sigmoid mesocolon are noted. Normal appendix. Vascular/Lymphatic: No significant atherosclerotic disease, aneurysm or dissection identified in the abdominal or pelvic vasculature. No lymphadenopathy noted in the abdomen or pelvis. Reproductive: Status post hysterectomy. Ovaries are not confidently identified may be surgically absent or atrophic. Other: No significant volume of ascites.  No pneumoperitoneum. Musculoskeletal: There are no aggressive appearing lytic or blastic lesions noted in the visualized portions of the skeleton. IMPRESSION: 1. Findings, as above, compatible with an acute diverticulitis in the proximal sigmoid colon. No definite diverticular abscess or findings of frank perforation are identified at this time. 2. Normal appendix. 3. Intra and extrahepatic biliary ductal dilatation which is only slightly more extensive than the prior study,  favored to reflect benign post cholecystectomy physiology. However, correlation with liver function tests is recommended to exclude the possibility of biliary tract obstruction. 4. Mild atherosclerosis. These results were called by telephone at the time of interpretation on 05/16/2015 at 2:10 pm to Dr. Loren Racer, who verbally acknowledged these results. Electronically Signed   By: Trudie Reed M.D.   On: 05/16/2015 14:10    Scheduled Meds: . ciprofloxacin  400 mg Intravenous Q12H  . latanoprost  1 drop Both Eyes QHS  . levothyroxine  25 mcg Oral QAC breakfast  . metoprolol  50 mg Oral BID  . metronidazole  500 mg Intravenous Q8H  . pantoprazole (PROTONIX) IV  40 mg Intravenous Q24H   Continuous Infusions: . dextrose 5 % and 0.45% NaCl 100 mL/hr at 05/17/15 0536       Time spent: 35 minutes    [facility] A  [facility] Pager [phone] If 7PM-7AM, please contact night-coverage at [email or web address], password [facility] 05/17/2015, 2:33 PM

## 2015-05-18 NOTE — Progress Notes (Signed)
[facility] PROGRESS NOTE   Angela [name] ZOX:[id number] DOB: Jun 13, 1945 DOA: 05/16/2015 PCP: Myrlene Broker, MD  HPI/Subjective: Had 2 loose bowel movements since yesterday. Less abdominal pain, she still has some bloating.  Assessment/Plan: Active Problems:   Hypothyroidism   Essential hypertension   Diverticulitis   Acute sigmoid Diverticulitis:  Presented with subjective fever, gallops Q abdominal pain and diarrhea. CT scan showed acute sigmoid diverticulitis without evidence of perforation. Started on IV Cipro/Metro, continue antibiotics. Continue clear liquids, ambulate as tolerated.  Hypothyroidism: Continue with synthroid.   Anxiety; Continue with klonopin.   Glaucoma; Continue with Xalatan   Intra and extrahepatic biliary ductal dilatation which is only slightly more extensive than the prior study, favored to reflect benign post cholecystectomy physiology; mild elevated Alkaline phosphatase.   GERD Continue Protonix.  Code Status: Full Code Family Communication: Plan discussed with the patient. Disposition Plan: Remains inpatient Diet: Diet clear liquid Room service appropriate?: Yes; Fluid consistency:: Thin  Consultants:  None  Procedures:  None  Antibiotics:  Cipro/maternal   Objective: Filed Vitals:   05/17/15 2107 05/18/15 0445  BP: 114/48 116/60  Pulse: 63 60  Temp: 97.8 F (36.6 C) 98.5 F (36.9 C)  Resp: 16 16    Intake/Output Summary (Last 24 hours) at 05/18/15 1237 Last data filed at 05/18/15 0957  Gross per 24 hour  Intake 2638.33 ml  Output   1150 ml  Net 1488.33 ml   Filed Weights   05/16/15 1117 05/16/15 1900 05/18/15 0445  Weight: 68.04 kg (150 lb) 72.576 kg (160 lb) 74.254 kg (163 lb 11.2 oz)    Exam: General: Alert and awake, oriented x3, not in any acute distress. HEENT: anicteric sclera, pupils reactive to light and accommodation, EOMI CVS: S1-S2 clear, no murmur rubs or gallops Chest: clear to  auscultation bilaterally, no wheezing, rales or rhonchi Abdomen: soft nontender, nondistended, normal bowel sounds, no organomegaly Extremities: no cyanosis, clubbing or edema noted bilaterally Neuro: Cranial nerves II-XII intact, no focal neurological deficits  Data Reviewed: Basic Metabolic Panel:  Recent Labs Lab 05/16/15 1210 05/17/15 0656  NA 135 141  K 3.6 3.7  CL 100* 102  CO2 27 29  GLUCOSE 121* 98  BUN 9 <5*  CREATININE 0.99 1.02*  CALCIUM 9.1 9.1   Liver Function Tests:  Recent Labs Lab 05/16/15 1210 05/17/15 0656  AST 26 51*  ALT 47 52  ALKPHOS 156* 141*  BILITOT 0.6 0.6  PROT 7.3 6.3*  ALBUMIN 3.6 3.0*    Recent Labs Lab 05/16/15 1210  LIPASE 25   No results for input(s): AMMONIA in the last 168 hours. CBC:  Recent Labs Lab 05/16/15 1210 05/17/15 0656  WBC 13.3* 10.0  NEUTROABS 8.5*  --   HGB 12.2 11.4*  HCT 36.3 33.7*  MCV 87.9 89.6  PLT 323 298   Cardiac Enzymes:  Recent Labs Lab 05/16/15 1210  TROPONINI <0.03   BNP (last 3 results) No results for input(s): BNP in the last 8760 hours.  ProBNP (last 3 results) No results for input(s): PROBNP in the last 8760 hours.  CBG: No results for input(s): GLUCAP in the last 168 hours.  Micro No results found for this or any previous visit (from the past 240 hour(s)).   Studies: Ct Abdomen Pelvis W Contrast  05/16/2015  CLINICAL DATA:  70 year old female with history of diarrhea for 1 week and left lower quadrant pain for 2 days. EXAM: CT ABDOMEN AND PELVIS WITH CONTRAST TECHNIQUE: Multidetector CT imaging  of the abdomen and pelvis was performed using the standard protocol following bolus administration of intravenous contrast. CONTRAST:  OMNIPAQUE IOHEXOL 300 MG/ML SOLN, 50mL OMNIPAQUE IOHEXOL 300 MG/ML SOLN COMPARISON:  CT the abdomen and pelvis 01/05/2015. FINDINGS: Lower chest: Small calcified granuloma in the periphery of the right lower lobe. Hepatobiliary: No cystic or solid  hepatic lesions. Status post cholecystectomy. Mild to moderate intrahepatic biliary ductal dilatation, similar to the prior examination. Common bile duct measures 13 mm in the porta hepatis. No definite obstructive stone identified in the common bile duct. Pancreas: No pancreatic mass. No pancreatic ductal dilatation. No pancreatic or peripancreatic fluid or inflammatory changes. Spleen: Unremarkable. Adrenals/Urinary Tract: Sub cm low-attenuation lesion in the right kidney is too small to definitively characterize, but is unchanged, likely a tiny cyst. Left kidney and bilateral adrenal glands are normal in appearance. No hydroureteronephrosis. Urinary bladder is normal in appearance. Stomach/Bowel: Normal appearance of the stomach. No pathologic dilatation of small bowel or colon. There is extensive colonic diverticulosis. In the region of the proximal sigmoid colon there is extensive inflammation and mural thickening, presumably related to an acute diverticulitis. No discrete diverticular abscess is confidently identified at this time. No definite signs of frank perforation are identified. Extensive inflammatory changes in the fat of the sigmoid mesocolon are noted. Normal appendix. Vascular/Lymphatic: No significant atherosclerotic disease, aneurysm or dissection identified in the abdominal or pelvic vasculature. No lymphadenopathy noted in the abdomen or pelvis. Reproductive: Status post hysterectomy. Ovaries are not confidently identified may be surgically absent or atrophic. Other: No significant volume of ascites.  No pneumoperitoneum. Musculoskeletal: There are no aggressive appearing lytic or blastic lesions noted in the visualized portions of the skeleton. IMPRESSION: 1. Findings, as above, compatible with an acute diverticulitis in the proximal sigmoid colon. No definite diverticular abscess or findings of frank perforation are identified at this time. 2. Normal appendix. 3. Intra and extrahepatic biliary  ductal dilatation which is only slightly more extensive than the prior study, favored to reflect benign post cholecystectomy physiology. However, correlation with liver function tests is recommended to exclude the possibility of biliary tract obstruction. 4. Mild atherosclerosis. These results were called by telephone at the time of interpretation on 05/16/2015 at 2:10 pm to Dr. Loren Racer, who verbally acknowledged these results. Electronically Signed   By: Trudie Reed M.D.   On: 05/16/2015 14:10    Scheduled Meds: . ciprofloxacin  400 mg Intravenous Q12H  . latanoprost  1 drop Both Eyes QHS  . levothyroxine  25 mcg Oral QAC breakfast  . metoprolol  50 mg Oral BID  . metronidazole  500 mg Intravenous Q8H  . pantoprazole (PROTONIX) IV  40 mg Intravenous Q24H   Continuous Infusions: . dextrose 5 % and 0.45% NaCl 100 mL/hr at 05/18/15 0450       Time spent: 35 minutes    [facility] A  [facility] Pager [phone] If 7PM-7AM, please contact night-coverage at [email or web address], password [facility] 05/18/2015, 12:37 PM  LOS: 1 day

## 2015-05-19 LAB — GLUCOSE, CAPILLARY: GLUCOSE-CAPILLARY: 154 mg/dL — AB (ref 65–99)

## 2015-05-19 NOTE — Progress Notes (Signed)
[facility] PROGRESS NOTE   Angela [name] ZOX:[id number] DOB: [birth year] DOA: 05/16/2015 PCP: Myrlene Broker, MD  HPI/Subjective: Less abdominal pain, feels much better wants to try solid food. Less abdominal bloating.  Assessment/Plan: Active Problems:   Hypothyroidism   Essential hypertension   Diverticulitis   Acute sigmoid Diverticulitis:  Presented with subjective fever, LLQ abdominal pain and diarrhea. CT scan showed acute sigmoid diverticulitis without evidence of perforation. Started on IV Cipro/Metro, continue antibiotics. Tolerated clear liquids very well, will advance diet to soft mechanical.  Hypothyroidism: Continue with synthroid.   Anxiety; Continue with klonopin.   Glaucoma; Continue with Xalatan   Intra and extrahepatic biliary ductal dilatation which is only slightly more extensive than the prior study, favored to reflect benign post cholecystectomy physiology; mild elevated Alkaline phosphatase.   GERD Continue Protonix.  Code Status: Full Code Family Communication: Plan discussed with the patient. Disposition Plan: Remains inpatient Diet: DIET SOFT Room service appropriate?: Yes; Fluid consistency:: Thin  Consultants:  None  Procedures:  None  Antibiotics:  Cipro/maternal   Objective: Filed Vitals:   05/18/15 2028 05/19/15 0531  BP: 116/49 104/58  Pulse: 71 71  Temp: 100.4 F (38 C) 98.1 F (36.7 C)  Resp: 19 19    Intake/Output Summary (Last 24 hours) at 05/19/15 1314 Last data filed at 05/19/15 0900  Gross per 24 hour  Intake   2240 ml  Output   1450 ml  Net    790 ml   Filed Weights   05/16/15 1900 05/18/15 0445 05/19/15 0531  Weight: 72.576 kg (160 lb) 74.254 kg (163 lb 11.2 oz) 73.3 kg (161 lb 9.6 oz)    Exam: General: Alert and awake, oriented x3, not in any acute distress. HEENT: anicteric sclera, pupils reactive to light and accommodation, EOMI CVS: S1-S2 clear, no murmur rubs or gallops Chest:  clear to auscultation bilaterally, no wheezing, rales or rhonchi Abdomen: soft nontender, nondistended, normal bowel sounds, no organomegaly Extremities: no cyanosis, clubbing or edema noted bilaterally Neuro: Cranial nerves II-XII intact, no focal neurological deficits  Data Reviewed: Basic Metabolic Panel:  Recent Labs Lab 05/16/15 1210 05/17/15 0656  NA 135 141  K 3.6 3.7  CL 100* 102  CO2 27 29  GLUCOSE 121* 98  BUN 9 <5*  CREATININE 0.99 1.02*  CALCIUM 9.1 9.1   Liver Function Tests:  Recent Labs Lab 05/16/15 1210 05/17/15 0656  AST 26 51*  ALT 47 52  ALKPHOS 156* 141*  BILITOT 0.6 0.6  PROT 7.3 6.3*  ALBUMIN 3.6 3.0*    Recent Labs Lab 05/16/15 1210  LIPASE 25   No results for input(s): AMMONIA in the last 168 hours. CBC:  Recent Labs Lab 05/16/15 1210 05/17/15 0656  WBC 13.3* 10.0  NEUTROABS 8.5*  --   HGB 12.2 11.4*  HCT 36.3 33.7*  MCV 87.9 89.6  PLT 323 298   Cardiac Enzymes:  Recent Labs Lab 05/16/15 1210  TROPONINI <0.03   BNP (last 3 results) No results for input(s): BNP in the last 8760 hours.  ProBNP (last 3 results) No results for input(s): PROBNP in the last 8760 hours.  CBG: No results for input(s): GLUCAP in the last 168 hours.  Micro No results found for this or any previous visit (from the past 240 hour(s)).   Studies: No results found.  Scheduled Meds: . ciprofloxacin  400 mg Intravenous Q12H  . latanoprost  1 drop Both Eyes QHS  . levothyroxine  25 mcg Oral QAC  breakfast  . metoprolol  50 mg Oral BID  . metronidazole  500 mg Intravenous Q8H  . pantoprazole (PROTONIX) IV  40 mg Intravenous Q24H   Continuous Infusions: . dextrose 5 % and 0.45% NaCl 100 mL/hr at 05/19/15 0900       Time spent: 35 minutes    [facility] A  [facility] Pager [phone] If 7PM-7AM, please contact night-coverage at [email or web address], password [facility] 05/19/2015, 1:14 PM  LOS: 2 days

## 2015-05-20 LAB — CBC
HEMATOCRIT: 31.8 % — AB (ref 36.0–46.0)
Hemoglobin: 11.1 g/dL — ABNORMAL LOW (ref 12.0–15.0)
MCH: 30.6 pg (ref 26.0–34.0)
MCHC: 34.9 g/dL (ref 30.0–36.0)
MCV: 87.6 fL (ref 78.0–100.0)
PLATELETS: 340 10*3/uL (ref 150–400)
RBC: 3.63 MIL/uL — ABNORMAL LOW (ref 3.87–5.11)
RDW: 14 % (ref 11.5–15.5)
WBC: 11.3 10*3/uL — AB (ref 4.0–10.5)

## 2015-05-20 LAB — BASIC METABOLIC PANEL
Anion gap: 10 (ref 5–15)
CHLORIDE: 101 mmol/L (ref 101–111)
CO2: 26 mmol/L (ref 22–32)
CREATININE: 0.98 mg/dL (ref 0.44–1.00)
Calcium: 8.4 mg/dL — ABNORMAL LOW (ref 8.9–10.3)
GFR calc Af Amer: >60 mL/min (ref >60)
GFR calc non Af Amer: 58 mL/min — ABNORMAL LOW (ref >60)
GLUCOSE: 156 mg/dL — AB (ref 65–99)
POTASSIUM: 3.2 mmol/L — AB (ref 3.5–5.1)
SODIUM: 137 mmol/L (ref 135–145)

## 2015-05-20 NOTE — Progress Notes (Signed)
[facility] PROGRESS NOTE   Angela [name] ZOX:[id number] DOB: 08-18-1945 DOA: 05/16/2015 PCP: Myrlene Broker, MD  HPI/Subjective: Denies any abdominal pain, no nausea or abdominal pain after food. She had a fever of 101 last night, feels okay now.  Assessment/Plan: Active Problems:   Hypothyroidism   Essential hypertension   Diverticulitis   Acute sigmoid Diverticulitis:  Presented with subjective fever, LLQ abdominal pain and diarrhea. CT scan showed acute sigmoid diverticulitis without evidence of perforation. Started on IV Cipro/Metro, continue antibiotics. Diet advance to soft mechanical, this is been tolerated very well. Had a fever of 101 last night continue current antibiotics.  Hypothyroidism: Continue with synthroid.   Anxiety; Continue with klonopin.   Glaucoma; Continue with Xalatan   Intra and extrahepatic biliary ductal dilatation which is only slightly more extensive than the prior study, favored to reflect benign post cholecystectomy physiology; mild elevated Alkaline phosphatase.   GERD Continue Protonix.  Code Status: Full Code Family Communication: Plan discussed with the patient. Disposition Plan: Remains inpatient Diet: DIET SOFT Room service appropriate?: Yes; Fluid consistency:: Thin  Consultants:  None  Procedures:  None  Antibiotics:  Cipro/maternal   Objective: Filed Vitals:   05/19/15 2219 05/20/15 0443  BP: 108/50 102/46  Pulse: 107 79  Temp: 101 F (38.3 C) 99.3 F (37.4 C)  Resp: 16 17    Intake/Output Summary (Last 24 hours) at 05/20/15 1201 Last data filed at 05/20/15 0949  Gross per 24 hour  Intake   3219 ml  Output    500 ml  Net   2719 ml   Filed Weights   05/18/15 0445 05/19/15 0531 05/20/15 0443  Weight: 74.254 kg (163 lb 11.2 oz) 73.3 kg (161 lb 9.6 oz) 74.844 kg (165 lb)    Exam: General: Alert and awake, oriented x3, not in any acute distress. HEENT: anicteric sclera, pupils reactive to  light and accommodation, EOMI CVS: S1-S2 clear, no murmur rubs or gallops Chest: clear to auscultation bilaterally, no wheezing, rales or rhonchi Abdomen: soft nontender, nondistended, normal bowel sounds, no organomegaly Extremities: no cyanosis, clubbing or edema noted bilaterally Neuro: Cranial nerves II-XII intact, no focal neurological deficits  Data Reviewed: Basic Metabolic Panel:  Recent Labs Lab 05/16/15 1210 05/17/15 0656 05/20/15 0857  NA 135 141 137  K 3.6 3.7 3.2*  CL 100* 102 101  CO2 GLUCOSE 121* 98 156*  BUN 9 <5* <5*  CREATININE 0.99 1.02* 0.98  CALCIUM 9.1 9.1 8.4*   Liver Function Tests:  Recent Labs Lab 05/16/15 1210 05/17/15 0656  AST 26 51*  ALT 47 52  ALKPHOS 156* 141*  BILITOT 0.6 0.6  PROT 7.3 6.3*  ALBUMIN 3.6 3.0*    Recent Labs Lab 05/16/15 1210  LIPASE 25   No results for input(s): AMMONIA in the last 168 hours. CBC:  Recent Labs Lab 05/16/15 1210 05/17/15 0656 05/20/15 0857  WBC 13.3* 10.0 11.3*  NEUTROABS 8.5*  --   --   HGB 12.2 11.4* 11.1*  HCT 36.3 33.7* 31.8*  MCV 87.9 89.6 87.6  PLT 323 298 340   Cardiac Enzymes:  Recent Labs Lab 05/16/15 1210  TROPONINI <0.03   BNP (last 3 results) No results for input(s): BNP in the last 8760 hours.  ProBNP (last 3 results) No results for input(s): PROBNP in the last 8760 hours.  CBG:  Recent Labs Lab 05/19/15 1705  GLUCAP 154*    Micro No results found for this or any previous  visit (from the past 240 hour(s)).   Studies: No results found.  Scheduled Meds: . ciprofloxacin  400 mg Intravenous Q12H  . latanoprost  1 drop Both Eyes QHS  . levothyroxine  25 mcg Oral QAC breakfast  . metoprolol  50 mg Oral BID  . metronidazole  500 mg Intravenous Q8H  . pantoprazole (PROTONIX) IV  40 mg Intravenous Q24H   Continuous Infusions: . dextrose 5 % and 0.45% NaCl 100 mL (05/20/15 1002)       Time spent: 35 minutes    [facility] A  [facility] Pager [phone] If 7PM-7AM, please contact night-coverage at [email or web address], password [facility] 05/20/2015, 12:01 PM  LOS: 3 days

## 2015-05-21 LAB — BASIC METABOLIC PANEL
ANION GAP: 7 (ref 5–15)
BUN: <5 mg/dL — ABNORMAL LOW (ref 6–20)
CHLORIDE: 106 mmol/L (ref 101–111)
CO2: 30 mmol/L (ref 22–32)
Calcium: 8.8 mg/dL — ABNORMAL LOW (ref 8.9–10.3)
Creatinine, Ser: 0.85 mg/dL (ref 0.44–1.00)
GFR calc non Af Amer: >60 mL/min (ref >60)
Glucose, Bld: 126 mg/dL — ABNORMAL HIGH (ref 65–99)
POTASSIUM: 3.4 mmol/L — AB (ref 3.5–5.1)
SODIUM: 143 mmol/L (ref 135–145)

## 2015-05-21 LAB — CBC
HEMATOCRIT: 31 % — AB (ref 36.0–46.0)
HEMOGLOBIN: 10.7 g/dL — AB (ref 12.0–15.0)
MCH: 30.4 pg (ref 26.0–34.0)
MCHC: 34.5 g/dL (ref 30.0–36.0)
MCV: 88.1 fL (ref 78.0–100.0)
PLATELETS: 334 10*3/uL (ref 150–400)
RBC: 3.52 MIL/uL — AB (ref 3.87–5.11)
RDW: 14.2 % (ref 11.5–15.5)
WBC: 8.9 10*3/uL (ref 4.0–10.5)

## 2015-05-21 MED ORDER — PANTOPRAZOLE SODIUM 40 MG PO TBEC: 40.0000 mg | DELAYED_RELEASE_TABLET | Freq: Every day | ORAL | Status: DC

## 2015-05-21 MED ORDER — CIPROFLOXACIN HCL 500 MG PO TABS: 500.0000 mg | ORAL_TABLET | Freq: Two times a day (BID) | ORAL | Status: DC

## 2015-05-21 MED ORDER — CLONAZEPAM 0.5 MG PO TABS: ORAL_TABLET | ORAL | Status: DC

## 2015-05-21 MED ORDER — METRONIDAZOLE 500 MG PO TABS: 500.0000 mg | ORAL_TABLET | Freq: Three times a day (TID) | ORAL | Status: DC

## 2015-05-21 NOTE — Care Management Important Message (Signed)
Important Message  Patient Details  Name: AVEYAH GREENWOOD MRN: [id number] Date of Birth: 03-Jan-1946   Medicare Important Message Given:  Yes    Rayvon Char 05/21/2015, 10:59 AMImportant Message  Patient Details  Name: AISSA LISOWSKI MRN: [id number] Date of Birth: [birth year]   Medicare Important Message Given:  Yes    Yukie Bergeron G 05/21/2015, 10:59 AM

## 2015-05-21 NOTE — Progress Notes (Signed)
Discharge home. Honme discharge instruction given, no question verbalized.

## 2015-05-21 NOTE — Discharge Summary (Signed)
Physician Discharge Summary  Angela [name] ZOX:[id number] DOB: 1945-05-12 DOA: 05/16/2015  PCP: Myrlene Broker, MD  Admit date: 05/16/2015 Discharge date: 05/21/2015  Time spent: 40 minutes  Recommendations for Outpatient Follow-up:  1. Follow-up with primary care physician in one week. 2. Cipro and Flagyl for 5 more days   Discharge Diagnoses:  Active Problems:   Hypothyroidism   Essential hypertension   Diverticulitis   Discharge Condition: Stable  Diet recommendation: Heart healthy  Filed Weights   05/20/15 0443 05/21/15 0500 05/21/15 0515  Weight: 74.844 kg (165 lb) 76.93 kg (169 lb 9.6 oz) 77.1 kg (169 lb 15.6 oz)    History of present illness:  Angela [name] is a 70 y.o. female with PMH IBS, Diverticulosis who presents complaining of abdominal pain that started 2 days prior to admission. This is new pain, different from her IBS symptoms. She report pain as sharp, worse with movement. She also report Diarrhea for 1 week. She had stool culture done at her Primary GI Doctor. Stool culture negative. She presents to [facility] with worsening abdominal pain, and fever. She report bright blood on tissue paper, from her hemorrhoids. She has had multiple BM.  She denies nausea, vomiting, chest pain or dyspnea.  Evaluation in the ED; CT with evidence of sigmoid diverticulitis. WBC at 13.   Hospital Course:   Acute sigmoid Diverticulitis:  Presented with subjective fever, LLQ abdominal pain and diarrhea. CT scan showed acute sigmoid diverticulitis without evidence of perforation. Started on IV Cipro/Metro was nothing by mouth initially. Diet advance to soft mechanical, this is been tolerated very well. Had a fever of 101 while in the hospital. Tolerated diet, no fever or chills, discharge on 5 more days of Metro/Cipro.  Hypothyroidism: Continue with synthroid.   Anxiety; Continue with klonopin, asked for refill, 20 pills prescribed until she sees her PCP.    Glaucoma; Continue with Xalatan   Intra and extrahepatic biliary ductal dilatation which is only slightly more extensive than the prior study, favored to reflect benign post cholecystectomy physiology; mild elevated Alkaline phosphatase.   GERD Continue Protonix.   Procedures:  None  Consultations:  None  Discharge Exam: Filed Vitals:   05/20/15 2118 05/21/15 0515  BP: 111/50 101/59  Pulse: 73 69  Temp: 98.3 F (36.8 C) 97.8 F (36.6 C)  Resp: 18 17   General: Alert and awake, oriented x3, not in any acute distress. HEENT: anicteric sclera, pupils reactive to light and accommodation, EOMI CVS: S1-S2 clear, no murmur rubs or gallops Chest: clear to auscultation bilaterally, no wheezing, rales or rhonchi Abdomen: soft nontender, nondistended, normal bowel sounds, no organomegaly Extremities: no cyanosis, clubbing or edema noted bilaterally Neuro: Cranial nerves II-XII intact, no focal neurological deficits  Discharge Instructions   Discharge Instructions    Diet - low sodium heart healthy    Complete by:  As directed      Increase activity slowly    Complete by:  As directed           Current Discharge Medication List    START taking these medications   Details  ciprofloxacin (CIPRO) 500 MG tablet Take 1 tablet (500 mg total) by mouth 2 (two) times daily. Qty: 10 tablet, Refills: 0    metroNIDAZOLE (FLAGYL) 500 MG tablet Take 1 tablet (500 mg total) by mouth 3 (three) times daily. Qty: 15 tablet, Refills: 0      CONTINUE these medications which have CHANGED   Details  clonazePAM (KLONOPIN)  0.5 MG tablet 1 po bid as needed Qty: 20 tablet, Refills: 0      CONTINUE these medications which have NOT CHANGED   Details  AMBULATORY NON FORMULARY MEDICATION Lidocaine HCL Isotonic (45ml) 4% Nasal Spray As needed    aspirin 81 MG tablet Take 81 mg by mouth daily.      celecoxib (CELEBREX) 200 MG capsule TAKE 1 CAPSULE BY MOUTH EVERY DAY Qty: 30 capsule,  Refills: 3    cholestyramine (QUESTRAN) 4 GM/DOSE powder Take 1 packet (4 g total) by mouth daily. Qty: 378 g, Refills: 12    HYDROcodone-acetaminophen (NORCO/VICODIN) 5-325 MG per tablet Take 1 tablet by mouth 3 (three) times daily as needed. Qty: 90 tablet, Refills: 0    hydrocortisone (ANUSOL-HC) 2.5 % rectal cream Place 1 application rectally 2 (two) times daily. Qty: 30 g, Refills: 1    ketoconazole (NIZORAL) 2 % cream Apply 1 application topically daily as needed for irritation.    latanoprost (XALATAN) 0.005 % ophthalmic solution Place 1 drop into both eyes at bedtime.    levothyroxine (SYNTHROID, LEVOTHROID) 50 MCG tablet Take 1/2 tablet by mouth daily Qty: 30 tablet, Refills: 6    magnesium oxide (MAG-OX) 400 MG tablet Take 400 mg by mouth every morning.    metoprolol (LOPRESSOR) 50 MG tablet Take 1 tablet (50 mg total) by mouth 2 (two) times daily. APPOINTMENT NEEDED FOR FUTURE REFILLS Qty: 50 tablet, Refills: 0    metroNIDAZOLE (METROCREAM) 0.75 % cream Apply 1 application topically daily.    NEXIUM 20 MG capsule TAKE 2 CAPSULES BY MOUTH EVERY DAY AT NOON**PA REQ** Qty: 60 capsule, Refills: 0    Probiotic Product (PROBIOTIC DAILY PO) Take 1 capsule by mouth daily.    simethicone (MYLICON) 125 MG chewable tablet Chew 125 mg by mouth every 6 (six) hours as needed for flatulence.    Sulfacetamide Sodium (OVACE PLUS EX) Apply topically as needed.    traMADol (ULTRAM) 50 MG tablet Take 1 tablet (50 mg total) by mouth 3 (three) times daily as needed (arthritis pain). Qty: 90 tablet, Refills: 1    Triamcinolone Acetonide (NASACORT AQ NA) Place 1 spray into the nose as needed.     Vitamin D, Ergocalciferol, (DRISDOL) 50000 UNITS CAPS capsule TAKE 1 CAPSULE (50,000 UNITS TOTAL) BY MOUTH ONCE A WEEK. Qty: 4 capsule, Refills: 6       Allergies  Allergen Reactions  . Tetracycline Other (See Comments)    GI bleed  . Codeine Nausea And Vomiting    GI upset/vomit  .  Paroxetine Rash   Follow-up Information    Follow up with Myrlene Broker, MD In 1 week.   Specialty:  Internal Medicine   Contact information:   [address] [place] [address] [phone]        The results of significant diagnostics from this hospitalization (including imaging, microbiology, ancillary and laboratory) are listed below for reference.    Significant Diagnostic Studies: Ct Abdomen Pelvis W Contrast  05/16/2015  CLINICAL DATA:  70 year old female with history of diarrhea for 1 week and left lower quadrant pain for 2 days. EXAM: CT ABDOMEN AND PELVIS WITH CONTRAST TECHNIQUE: Multidetector CT imaging of the abdomen and pelvis was performed using the standard protocol following bolus administration of intravenous contrast. CONTRAST:  OMNIPAQUE IOHEXOL 300 MG/ML SOLN, 50mL OMNIPAQUE IOHEXOL 300 MG/ML SOLN COMPARISON:  CT the abdomen and pelvis 01/05/2015. FINDINGS: Lower chest: Small calcified granuloma in the periphery of the right lower  lobe. Hepatobiliary: No cystic or solid hepatic lesions. Status post cholecystectomy. Mild to moderate intrahepatic biliary ductal dilatation, similar to the prior examination. Common bile duct measures 13 mm in the porta hepatis. No definite obstructive stone identified in the common bile duct. Pancreas: No pancreatic mass. No pancreatic ductal dilatation. No pancreatic or peripancreatic fluid or inflammatory changes. Spleen: Unremarkable. Adrenals/Urinary Tract: Sub cm low-attenuation lesion in the right kidney is too small to definitively characterize, but is unchanged, likely a tiny cyst. Left kidney and bilateral adrenal glands are normal in appearance. No hydroureteronephrosis. Urinary bladder is normal in appearance. Stomach/Bowel: Normal appearance of the stomach. No pathologic dilatation of small bowel or colon. There is extensive colonic diverticulosis. In the region of the proximal sigmoid colon there is extensive  inflammation and mural thickening, presumably related to an acute diverticulitis. No discrete diverticular abscess is confidently identified at this time. No definite signs of frank perforation are identified. Extensive inflammatory changes in the fat of the sigmoid mesocolon are noted. Normal appendix. Vascular/Lymphatic: No significant atherosclerotic disease, aneurysm or dissection identified in the abdominal or pelvic vasculature. No lymphadenopathy noted in the abdomen or pelvis. Reproductive: Status post hysterectomy. Ovaries are not confidently identified may be surgically absent or atrophic. Other: No significant volume of ascites.  No pneumoperitoneum. Musculoskeletal: There are no aggressive appearing lytic or blastic lesions noted in the visualized portions of the skeleton. IMPRESSION: 1. Findings, as above, compatible with an acute diverticulitis in the proximal sigmoid colon. No definite diverticular abscess or findings of frank perforation are identified at this time. 2. Normal appendix. 3. Intra and extrahepatic biliary ductal dilatation which is only slightly more extensive than the prior study, favored to reflect benign post cholecystectomy physiology. However, correlation with liver function tests is recommended to exclude the possibility of biliary tract obstruction. 4. Mild atherosclerosis. These results were called by telephone at the time of interpretation on 05/16/2015 at 2:10 pm to Dr. Loren Racer, who verbally acknowledged these results. Electronically Signed   By: Trudie Reed M.D.   On: 05/16/2015 14:10    Microbiology: No results found for this or any previous visit (from the past 240 hour(s)).   Labs: Basic Metabolic Panel:  Recent Labs Lab 05/16/15 1210 05/17/15 0656 05/20/15 0857 05/21/15 0518  NA 135 141 137 143  K 3.6 3.7 3.2* 3.4*  CL 100* 102 101 106  CO2 27 29 26 30   GLUCOSE 121* 98 156* 126*  BUN 9 <5* <5* <5*  CREATININE 0.99 1.02* 0.98 0.85  CALCIUM  9.1 9.1 8.4* 8.8*   Liver Function Tests:  Recent Labs Lab 05/16/15 1210 05/17/15 0656  AST 26 51*  ALT 47 52  ALKPHOS 156* 141*  BILITOT 0.6 0.6  PROT 7.3 6.3*  ALBUMIN 3.6 3.0*    Recent Labs Lab 05/16/15 1210  LIPASE 25   No results for input(s): AMMONIA in the last 168 hours. CBC:  Recent Labs Lab 05/16/15 1210 05/17/15 0656 05/20/15 0857 05/21/15 0518  WBC 13.3* 10.0 11.3* 8.9  NEUTROABS 8.5*  --   --   --   HGB 12.2 11.4* 11.1* 10.7*  HCT 36.3 33.7* 31.8* 31.0*  MCV 87.9 89.6 87.6 88.1  PLT 323 298 340 334   Cardiac Enzymes:  Recent Labs Lab 05/16/15 1210  TROPONINI <0.03   BNP: BNP (last 3 results) No results for input(s): BNP in the last 8760 hours.  ProBNP (last 3 results) No results for input(s): PROBNP in the last 8760 hours.  CBG:  Recent Labs Lab 05/19/15 1705  GLUCAP 154*       Signed:  Tiler Brandis A MD.  [facility] 05/21/2015, 11:11 AM

## 2015-05-22 ENCOUNTER — Telehealth: Payer: Self-pay | Admitting: Cardiology

## 2015-05-22 ENCOUNTER — Telehealth: Payer: Self-pay | Admitting: *Deleted

## 2015-05-22 NOTE — Telephone Encounter (Signed)
Angela [name] is calling because she is has some questions about he medication. Metoprolol. Please call   Thanks

## 2015-05-22 NOTE — Telephone Encounter (Signed)
Green vegetables not contra-indicated with anything in her current medication list.

## 2015-05-22 NOTE — Telephone Encounter (Signed)
Green vegetables not contra-indicated with anything in her current medication list.  Patient informed

## 2015-05-22 NOTE — Telephone Encounter (Signed)
Transition Care Management Follow-up Telephone Call   Date discharged? 05/21/15   How have you been since you were released from the hospital? Pt states she is doing well   Do you understand why you were in the hospital? YES   Do you understand the discharge instructions? YES   Where were you discharged to? Home   Items Reviewed:  Medications reviewed: YES  Allergies reviewed: YES  Dietary changes reviewed: YES  Referrals reviewed: No referral needed   Functional Questionnaire:   Activities of Daily Living (ADLs):   She states she are independent in the following: ambulation, bathing and hygiene, feeding, continence, grooming, toileting and dressing States she doesn't require assistance    Any transportation issues/concerns?: NO   Any patient concerns? YES, pt states she hasn't had a BM since she been home. Inform pt to use miralax or stool softener    Confirmed importance and date/time of follow-up visits scheduled YES, 05/31/15  Provider Appointment booked with Dr. Okey Dupre  Confirmed with patient if condition begins to worsen call PCP or go to the ER.  Patient was given the office number and encouraged to call back with question or concerns.  : YES

## 2015-05-22 NOTE — Telephone Encounter (Signed)
Patient wants to know if it would be contraindicated for her to eat green vegetables because of the medications she takes  Routing to [employer]

## 2015-05-28 ENCOUNTER — Telehealth: Payer: Self-pay | Admitting: Cardiology

## 2015-05-28 MED ORDER — METOPROLOL TARTRATE 50 MG PO TABS: 50.0000 mg | ORAL_TABLET | Freq: Two times a day (BID) | ORAL | Status: DC

## 2015-05-28 NOTE — Telephone Encounter (Signed)
New message     Pt c/o BP issue: STAT if pt c/o blurred vision, one-sided weakness or slurred speech  1. What are your last 5 BP readings? 120/68 this am with no medication, 90/50 while in hosp  2. Are you having any other symptoms (ex. Dizziness, headache, blurred vision, passed out)? dizziness  3. What is your BP issue? Pt is concerned that her bp is low

## 2015-05-28 NOTE — Telephone Encounter (Signed)
Pt advised on BP checks, continue meds as scheduled but could try metoprolol at  BID d/t pressures running low - report if any return of palpitations, if BPs higher than normal - would resume  BID dose. Pt overdue for OV - scheduled to see Dr. Jens Som on 07/03/15.

## 2015-05-31 ENCOUNTER — Encounter: Payer: Self-pay | Admitting: Internal Medicine

## 2015-05-31 ENCOUNTER — Telehealth: Payer: Self-pay | Admitting: Internal Medicine

## 2015-05-31 ENCOUNTER — Ambulatory Visit (INDEPENDENT_AMBULATORY_CARE_PROVIDER_SITE_OTHER): Payer: Medicare Other | Admitting: Internal Medicine

## 2015-05-31 VITALS — BP 128/66 | HR 68 | Temp 98.4°F | Ht 61.0 in | Wt 161.0 lb

## 2015-05-31 DIAGNOSIS — I10 Essential (primary) hypertension: Secondary | ICD-10-CM

## 2015-05-31 DIAGNOSIS — K5732 Diverticulitis of large intestine without perforation or abscess without bleeding: Secondary | ICD-10-CM

## 2015-05-31 DIAGNOSIS — Z23 Encounter for immunization: Secondary | ICD-10-CM

## 2015-05-31 MED ORDER — MAGIC MOUTHWASH: 5.0000 mL | Freq: Three times a day (TID) | ORAL | Status: DC

## 2015-05-31 NOTE — Assessment & Plan Note (Signed)
BP at goal on metoprolol 25 mg BID. Continue to monitor.

## 2015-05-31 NOTE — Progress Notes (Signed)
   Subjective:    Patient ID: Angela [name], female    DOB: 08/27/1945, 70 y.o.   MRN: [id number]  HPI The patient is a 70 YO female coming in for hospital follow up (in for diverticulitis with treatment with antibiotics). Recovering well. Still some loose stools 3 times per day. No more diarrhea. Pain is much improved. She is still feeling weak. No falls at home. Eating and drinking well. Wasn't sure about what diet to do. Still taking probiotic. Denies fevers or chills. No blood in her stool. Had adjustment of her blood pressure medicine since low blood pressures in the hospital and they are still at goal now at home.  PMH, [facility], social history reviewed and updated.   Review of Systems  Constitutional: Positive for activity change. Negative for fever, appetite change, fatigue and unexpected weight change.  HENT: Negative.   Respiratory: Negative for cough, chest tightness, shortness of breath and wheezing.   Cardiovascular: Negative for chest pain, palpitations and leg swelling.       Aching chronic from costochondritis  Gastrointestinal: Positive for diarrhea. Negative for nausea, vomiting, abdominal pain, constipation, blood in stool and abdominal distention.       Chronic  Endocrine: Negative.   Musculoskeletal: Positive for myalgias. Negative for arthralgias and gait problem.  Skin: Negative.   Neurological: Negative for dizziness, seizures, weakness, light-headedness, numbness and headaches.  Psychiatric/Behavioral: Negative.       Objective:   Physical Exam  Constitutional: She is oriented to person, place, and time. She appears well-developed and well-nourished.  HENT:  Head: Normocephalic and atraumatic.  Eyes: EOM are normal.  Neck: Normal range of motion.  Cardiovascular: Normal rate and regular rhythm.   Pulmonary/Chest: Effort normal and breath sounds normal. No respiratory distress. She has no wheezes. She has no rales.  Abdominal: Soft. Bowel sounds are normal. She  exhibits no distension. There is no tenderness. There is no rebound.  Slightly increased bowel sounds  Musculoskeletal: She exhibits no edema.  Neurological: She is alert and oriented to person, place, and time. No cranial nerve deficit. Coordination normal.  Skin: Skin is warm and dry.   Filed Vitals:   05/31/15 1137  BP: 128/66  Pulse: 68  Temp: 98.4 F (36.9 C)  TempSrc: Oral  Height:  (1.549 m)  Weight: 161 lb (73.029 kg)  SpO2: 97%      Assessment & Plan:  Prevnar 13 given at visit.

## 2015-05-31 NOTE — Telephone Encounter (Signed)
Patient was just in to see you today and she forgot to bring up that her tongue has a white coating on it and she can't taste anything. She's wondering if it could be from all the medication that she was given while she was in the hospital. Can you please give her a call at [phone]

## 2015-05-31 NOTE — Telephone Encounter (Signed)
Sent in magic mouthwash to take until gone to resolve.

## 2015-05-31 NOTE — Progress Notes (Signed)
Pre visit review using our clinic review tool, if applicable. No additional management support is needed unless otherwise documented below in the visit note. 

## 2015-05-31 NOTE — Telephone Encounter (Signed)
Patient aware and will go pick up 

## 2015-05-31 NOTE — Assessment & Plan Note (Signed)
Symptoms much improved with cipro and flagyl. She is taking probioic. Advised her to start some fiber to help with her continued loose bowel movements. We talked about adding dietary things back gradually. No restrictions for the future.

## 2015-05-31 NOTE — Patient Instructions (Signed)
We have given you the pneumonia booster shot today.  We did not take any blood work.   Consider adding some fiber back into your diet with food or supplement to help with the bowel changes.   Keep taking the probiotic.  High-Fiber Diet Fiber, also called dietary fiber, is a type of carbohydrate found in fruits, vegetables, whole grains, and beans. A high-fiber diet can have many health benefits. Your health care provider may recommend a high-fiber diet to help:  Prevent constipation. Fiber can make your bowel movements more regular.  Lower your cholesterol.  Relieve hemorrhoids, uncomplicated diverticulosis, or irritable bowel syndrome.  Prevent overeating as part of a weight-loss plan.  Prevent heart disease, type 2 diabetes, and certain cancers. WHAT IS MY PLAN? The recommended daily intake of fiber includes:  38 grams for men under age 47.  30 grams for men over age 53.  25 grams for women under age 56.  21 grams for women over age 47. You can get the recommended daily intake of dietary fiber by eating a variety of fruits, vegetables, grains, and beans. Your health care provider may also recommend a fiber supplement if it is not possible to get enough fiber through your diet. WHAT DO I NEED TO KNOW ABOUT A HIGH-FIBER DIET?  Fiber supplements have not been widely studied for their effectiveness, so it is better to get fiber through food sources.  Always check the fiber content on thenutrition facts label of any prepackaged food. Look for foods that contain at least 5 grams of fiber per serving.  Ask your dietitian if you have questions about specific foods that are related to your condition, especially if those foods are not listed in the following section.  Increase your daily fiber consumption gradually. Increasing your intake of dietary fiber too quickly may cause bloating, cramping, or gas.  Drink plenty of water. Water helps you to digest fiber. WHAT FOODS CAN I  EAT? Grains Whole-grain breads. Multigrain cereal. Oats and oatmeal. Conti rice. Barley. Bulgur wheat. Millet. Bran muffins. Popcorn. Rye wafer crackers. Vegetables Sweet potatoes. Spinach. Kale. Artichokes. Cabbage. Broccoli. Green peas. Carrots. Squash. Fruits Berries. Pears. Apples. Oranges. Avocados. Prunes and raisins. Dried figs. Meats and Other Protein Sources Navy, kidney, pinto, and soy beans. Split peas. Lentils. Nuts and seeds. Dairy Fiber-fortified yogurt. Beverages Fiber-fortified soy milk. Fiber-fortified orange juice. Other Fiber bars. The items listed above may not be a complete list of recommended foods or beverages. Contact your dietitian for more options. WHAT FOODS ARE NOT RECOMMENDED? Grains White bread. Pasta made with refined flour. White rice. Vegetables Fried potatoes. Canned vegetables. Well-cooked vegetables.  Fruits Fruit juice. Cooked, strained fruit. Meats and Other Protein Sources Fatty cuts of meat. Fried Environmental education officer or fried fish. Dairy Milk. Yogurt. Cream cheese. Sour cream. Beverages Soft drinks. Other Cakes and pastries. Butter and oils. The items listed above may not be a complete list of foods and beverages to avoid. Contact your dietitian for more information. WHAT ARE SOME TIPS FOR INCLUDING HIGH-FIBER FOODS IN MY DIET?  Eat a wide variety of high-fiber foods.  Make sure that half of all grains consumed each day are whole grains.  Replace breads and cereals made from refined flour or white flour with whole-grain breads and cereals.  Replace white rice with [place]redano rice, bulgur wheat, or millet.  Start the day with a breakfast that is high in fiber, such as a cereal that contains at least 5 grams of fiber per serving.  Use  beans in place of meat in soups, salads, or pasta.  Eat high-fiber snacks, such as berries, raw vegetables, nuts, or popcorn.   This information is not intended to replace advice given to you by your health care  provider. Make sure you discuss any questions you have with your health care provider.   Document Released: 03/31/2005 Document Revised: 04/21/2014 Document Reviewed: 09/13/2013 Elsevier Interactive Patient Education [employer]

## 2015-06-01 ENCOUNTER — Telehealth: Payer: Self-pay | Admitting: Internal Medicine

## 2015-06-01 NOTE — Telephone Encounter (Signed)
Resent to [facility]. Patient aware.

## 2015-06-01 NOTE — Telephone Encounter (Signed)
Pt called state that magic mouthwash is not the [facility]. Please resend.

## 2015-06-27 ENCOUNTER — Other Ambulatory Visit: Payer: Self-pay | Admitting: Internal Medicine

## 2015-06-28 NOTE — Progress Notes (Signed)
HPI: FU palpitations. Echo Sept 2011 showed normal LV function. A stress echocardiogram was performed in September of 2011 and revealed normal LV function and no stress-induced wall motion abnormalities. A CardioNet was performed and revealed sinus rhythm with PVCs. Palpitations treated with beta blocker. Stress echocardiogram April 2015 normal. 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 Stenosis not change compared to previous. Since I last saw her there is no dyspnea, chest pain or syncope. She continues to have occasional palpitations that occur only at night when she awakens abruptly.  Current Outpatient Prescriptions  Medication Sig Dispense Refill  . aspirin 81 MG tablet Take 81 mg by mouth daily.      . celecoxib (CELEBREX) 200 MG capsule TAKE 1 CAPSULE BY MOUTH EVERY DAY 30 capsule 3  . cholestyramine (QUESTRAN) 4 GM/DOSE powder Take 1 packet (4 g total) by mouth daily. 378 g 12  . clonazePAM (KLONOPIN) 0.5 MG tablet 1 po bid as needed 20 tablet 0  . HYDROcodone-acetaminophen (NORCO/VICODIN) 5-325 MG per tablet Take 1 tablet by mouth 3 (three) times daily as needed. 90 tablet 0  . ketoconazole (NIZORAL) 2 % cream Apply 1 application topically daily as needed for irritation.    Marland Kitchen latanoprost (XALATAN) 0.005 % ophthalmic solution Place 1 drop into both eyes at bedtime.    Marland Kitchen levothyroxine (SYNTHROID, LEVOTHROID) 50 MCG tablet TAKE 1/2 TABLET BY MOUTH DAILY 30 tablet 6  . magic mouthwash SOLN Take 5 mLs by mouth 3 (three) times daily. 10 mL 0  . magnesium oxide (MAG-OX) 400 MG tablet Take 400 mg by mouth every morning.    . metoprolol (LOPRESSOR) 50 MG tablet Take 1 tablet (50 mg total) by mouth 2 (two) times daily. Keep appt for refills. (Patient taking differently: 50 mg. Take one half tablet by mouth twice daily.) 60 tablet 0  . metroNIDAZOLE (METROCREAM) 0.75 % cream Apply 1 application topically daily.    Marland Kitchen NEXIUM 20 MG capsule TAKE 2 CAPSULES BY MOUTH EVERY  DAY AT NOON**PA REQ** (Patient taking differently: TAKE 1 CAPSULE  BY MOUTH EVERY DAY AT NOON**PA REQ**) 60 capsule 0  . Probiotic Product (PROBIOTIC DAILY PO) Take 1 capsule by mouth daily.    . simethicone (MYLICON) 125 MG chewable tablet Chew 125 mg by mouth every 6 (six) hours as needed for flatulence. Reported on 05/31/2015    . traMADol (ULTRAM) 50 MG tablet Take 1 tablet (50 mg total) by mouth 3 (three) times daily as needed (arthritis pain). 90 tablet 1  . Vitamin D, Ergocalciferol, (DRISDOL) 50000 UNITS CAPS capsule TAKE 1 CAPSULE (50,000 UNITS TOTAL) BY MOUTH ONCE A WEEK. 4 capsule 6   No current facility-administered medications for this visit.     Past Medical History  Diagnosis Date  . Hypertension   . Hyperlipidemia   . Hypothyroidism   . Hiatal hernia 04/25/2005  . GERD (gastroesophageal reflux disease) 09/13/2007  . Diverticulosis 05/10/2004  . IBS (irritable bowel syndrome)   . Colon polyps   . Osteoarthritis   . Fibromyalgia   . Vitamin D deficiency   . Migraine   . Anxiety   . Acne rosacea   . Bile salt-induced diarrhea   . Gastroparesis   . External hemorrhoids 05/10/2004  . Esophagitis 04/25/2005  . Duodenitis 04/25/2005  . Esophageal stricture 09/13/2007  . Sessile colonic polyp 05/30/2009  . Degenerative disc disease   . Vertigo   . 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 Other and unspecified noninfectious gastroenteritis and colitis(558.9)   . Glaucoma   . Diverticulitis 05/2015    Past Surgical History  Procedure Laterality Date  . Cholecystectomy  1985  . Vaginal hysterectomy  1993  . Cesarean section    . Foot osteotomy  2006    Right foot  . Tubal ligation  1983  . Bunionectomy  09/04/2011    left  . Colonoscopy      Social History   Social History  . Marital Status: Married    Spouse Name: N/A  . Number of Children: 2  . Years of Education: N/A   Occupational History  . Retired    Social History Main Topics   . Smoking status: Never Smoker   . Smokeless tobacco: Never Used  . Alcohol Use: No  . Drug Use: No  . Sexual Activity: Yes   Other Topics Concern  . Not on file   Social History Narrative    Family History  Problem Relation Age of Onset  . Mitral valve prolapse Mother   . Hypertension Father   . Colon polyps Father   . Stroke Father   . Hypertension Sister     x2  . Diabetes Sister   . Colon cancer Maternal Aunt   . Stomach cancer Maternal Aunt   . Colon polyps Sister     ROS: no fevers or chills, productive cough, hemoptysis, dysphasia, odynophagia, melena, hematochezia, dysuria, hematuria, rash, seizure activity, orthopnea, PND, pedal edema, claudication. Remaining systems are negative.  Physical Exam: Well-developed well-nourished in no acute distress.  Skin is warm and dry.  HEENT is normal.  Neck is supple. Right carotid bruit Chest is clear to auscultation with normal expansion.  Cardiovascular exam is regular rate and rhythm.  Abdominal exam nontender or distended. No masses palpated. Extremities show no edema. neuro grossly intact  ECG 05/16/2015-sinus rhythm with nonspecific ST changes.

## 2015-07-03 ENCOUNTER — Encounter: Payer: Self-pay | Admitting: Cardiology

## 2015-07-03 ENCOUNTER — Ambulatory Visit (INDEPENDENT_AMBULATORY_CARE_PROVIDER_SITE_OTHER): Payer: Medicare Other | Admitting: Cardiology

## 2015-07-03 VITALS — BP 106/60 | HR 68 | Ht 62.0 in | Wt 163.0 lb

## 2015-07-03 DIAGNOSIS — I773 Arterial fibromuscular dysplasia: Secondary | ICD-10-CM

## 2015-07-03 DIAGNOSIS — R002 Palpitations: Secondary | ICD-10-CM

## 2015-07-03 DIAGNOSIS — I10 Essential (primary) hypertension: Secondary | ICD-10-CM | POA: Diagnosis not present

## 2015-07-03 NOTE — Patient Instructions (Signed)
Your physician wants you to follow-up in: ONE YEAR WITH DR CRENSHAW You will receive a reminder letter in the mail two months in advance. If you don't receive a letter, please call our office to schedule the follow-up appointment.   If you need a refill on your cardiac medications before your next appointment, please call your pharmacy.  

## 2015-07-03 NOTE — Assessment & Plan Note (Signed)
Controlled. Continue present medications. 

## 2015-07-03 NOTE — Assessment & Plan Note (Signed)
Patient with history of fibromuscular dysplasia of carotids. [facility] is [facility].

## 2015-07-03 NOTE — Assessment & Plan Note (Signed)
Reasonably well controlled. Continue present medications.

## 2015-08-03 ENCOUNTER — Other Ambulatory Visit: Payer: Self-pay | Admitting: Cardiology

## 2015-08-03 NOTE — Telephone Encounter (Signed)
REFILL 

## 2015-08-26 ENCOUNTER — Other Ambulatory Visit: Payer: Self-pay | Admitting: Internal Medicine

## 2015-09-03 ENCOUNTER — Other Ambulatory Visit: Payer: Self-pay | Admitting: Physician Assistant

## 2015-09-05 ENCOUNTER — Encounter: Payer: Self-pay | Admitting: Internal Medicine

## 2015-09-05 ENCOUNTER — Ambulatory Visit (INDEPENDENT_AMBULATORY_CARE_PROVIDER_SITE_OTHER): Payer: Medicare Other | Admitting: Internal Medicine

## 2015-09-05 VITALS — BP 110/72 | HR 62 | Temp 98.3°F | Wt 163.0 lb

## 2015-09-05 DIAGNOSIS — L280 Lichen simplex chronicus: Secondary | ICD-10-CM | POA: Insufficient documentation

## 2015-09-05 DIAGNOSIS — J387 Other diseases of larynx: Secondary | ICD-10-CM | POA: Insufficient documentation

## 2015-09-05 MED ORDER — ACYCLOVIR 400 MG PO TABS: 400.0000 mg | ORAL_TABLET | Freq: Three times a day (TID) | ORAL | Status: DC

## 2015-09-05 NOTE — Assessment & Plan Note (Addendum)
9x4 mm ulcer lesion on the L side of her throat -- H simplex vs lichen vs other Zovirax po ENT ref if not better

## 2015-09-05 NOTE — Progress Notes (Signed)
Pre visit review using our clinic review tool, if applicable. No additional management support is needed unless otherwise documented below in the visit note. 

## 2015-09-05 NOTE — Assessment & Plan Note (Signed)
On her back per Dermatology

## 2015-09-05 NOTE — Progress Notes (Signed)
Subjective:  Patient ID: Angela [name], female    DOB: 06-27-1945  Age: 70 y.o. MRN: [id number]  CC: No chief complaint on file.   HPI KAHLYN SHIPPEY presents for ST and a lesion on the L side of her throat  Outpatient Prescriptions Prior to Visit  Medication Sig Dispense Refill  . aspirin 81 MG tablet Take 81 mg by mouth daily.      . celecoxib (CELEBREX) 200 MG capsule TAKE 1 CAPSULE BY MOUTH EVERY DAY 30 capsule 3  . cholestyramine (QUESTRAN) 4 GM/DOSE powder Take 1 packet (4 g total) by mouth daily. 378 g 12  . clonazePAM (KLONOPIN) 0.5 MG tablet 1 po bid as needed 20 tablet 0  . esomeprazole (NEXIUM) 40 MG capsule TAKE 1 CAPSULE (40 MG TOTAL) BY MOUTH 2 (TWO) TIMES DAILY BEFORE A MEAL. 60 capsule 2  . HYDROcodone-acetaminophen (NORCO/VICODIN) 5-325 MG per tablet Take 1 tablet by mouth 3 (three) times daily as needed. 90 tablet 0  . ketoconazole (NIZORAL) 2 % cream Apply 1 application topically daily as needed for irritation.    Marland Kitchen latanoprost (XALATAN) 0.005 % ophthalmic solution Place 1 drop into both eyes at bedtime.    Marland Kitchen levothyroxine (SYNTHROID, LEVOTHROID) 50 MCG tablet TAKE 1/2 TABLET BY MOUTH DAILY 30 tablet 6  . magic mouthwash SOLN Take 5 mLs by mouth 3 (three) times daily. 10 mL 0  . magnesium oxide (MAG-OX) 400 MG tablet Take 400 mg by mouth every morning.    . metoprolol (LOPRESSOR) 50 MG tablet Take 1 tablet (50 mg total) by mouth 2 (two) times daily. 60 tablet 11  . metroNIDAZOLE (METROCREAM) 0.75 % cream Apply 1 application topically daily.    Marland Kitchen NEXIUM 20 MG capsule TAKE 2 CAPSULES BY MOUTH EVERY DAY AT NOON**PA REQ** (Patient taking differently: TAKE 1 CAPSULE  BY MOUTH EVERY DAY AT NOON**PA REQ**) 60 capsule 0  . Probiotic Product (PROBIOTIC DAILY PO) Take 1 capsule by mouth daily.    . simethicone (MYLICON) 125 MG chewable tablet Chew 125 mg by mouth every 6 (six) hours as needed for flatulence. Reported on 05/31/2015    . traMADol (ULTRAM) 50 MG tablet Take 1  tablet (50 mg total) by mouth 3 (three) times daily as needed (arthritis pain). 90 tablet 1  . Vitamin D, Ergocalciferol, (DRISDOL) 50000 units CAPS capsule TAKE 1 CAPSULE (50,000 UNITS TOTAL) BY MOUTH ONCE A WEEK. 4 capsule 6   No facility-administered medications prior to visit.    ROS Review of Systems  Constitutional: Negative for chills, activity change, appetite change, fatigue and unexpected weight change.  HENT: Positive for sore throat. Negative for congestion, mouth sores and sinus pressure.   Eyes: Negative for visual disturbance.  Respiratory: Negative for cough and chest tightness.   Gastrointestinal: Negative for nausea and abdominal pain.  Genitourinary: Negative for frequency, difficulty urinating and vaginal pain.  Musculoskeletal: Negative for back pain and gait problem.  Skin: Negative for pallor and rash.  Neurological: Negative for dizziness, tremors, weakness, numbness and headaches.  Psychiatric/Behavioral: Negative for confusion and sleep disturbance.    Objective:  BP 110/72 mmHg  Pulse 62  Temp(Src) 98.3 F (36.8 C) (Oral)  Wt 163 lb (73.936 kg)  SpO2 98%  BP Readings from Last 3 Encounters:  09/05/15 110/72  07/03/15 106/60  05/31/15 128/66    Wt Readings from Last 3 Encounters:  09/05/15 163 lb (73.936 kg)  07/03/15 163 lb (73.936 kg)  05/31/15 161 lb (73.029 kg)  Physical Exam  Constitutional: She appears well-developed. No distress.  HENT:  Head: Normocephalic.  Right Ear: External ear normal.  Left Ear: External ear normal.  Nose: Nose normal.  Mouth/Throat: Oropharynx is clear and moist.  Eyes: Conjunctivae are normal. Pupils are equal, round, and reactive to light. Right eye exhibits no discharge. Left eye exhibits no discharge.  Neck: Normal range of motion. Neck supple. No JVD present. No tracheal deviation present. No thyromegaly present.  Cardiovascular: Normal rate, regular rhythm and normal heart sounds.   Pulmonary/Chest: No  stridor. No respiratory distress. She has no wheezes.  Abdominal: Soft. Bowel sounds are normal. She exhibits no distension and no mass. There is no tenderness. There is no rebound and no guarding.  Musculoskeletal: She exhibits no edema or tenderness.  Lymphadenopathy:    She has no cervical adenopathy.  Neurological: She displays normal reflexes. No cranial nerve deficit. She exhibits normal muscle tone. Coordination normal.  Skin: No rash noted. No erythema.  Psychiatric: She has a normal mood and affect. Her behavior is normal. Judgment and thought content normal.    9x4 mm ulcer lesion on the L side of her throat  Lab Results  Component Value Date   WBC 8.9 05/21/2015   HGB 10.7* 05/21/2015   HCT 31.0* 05/21/2015   PLT 334 05/21/2015   GLUCOSE 126* 05/21/2015   CHOL 217* 05/15/2014   TRIG 164.0* 05/15/2014   HDL 60.10 05/15/2014   LDLDIRECT 126.2 09/19/2010   LDLCALC 124* 05/15/2014   ALT 52 05/17/2015   AST 51* 05/17/2015   NA 143 05/21/2015   K 3.4* 05/21/2015   CL 106 05/21/2015   CREATININE 0.85 05/21/2015   BUN <5* 05/21/2015   CO2 30 05/21/2015   TSH 1.02 03/27/2015   INR 0.94 12/15/2009   HGBA1C 5.8 03/27/2015    Ct Abdomen Pelvis W Contrast  05/16/2015  CLINICAL DATA:  70 year old female with history of diarrhea for 1 week and left lower quadrant pain for 2 days. EXAM: CT ABDOMEN AND PELVIS WITH CONTRAST TECHNIQUE: Multidetector CT imaging of the abdomen and pelvis was performed using the standard protocol following bolus administration of intravenous contrast. CONTRAST:  OMNIPAQUE IOHEXOL 300 MG/ML SOLN, 50mL OMNIPAQUE IOHEXOL 300 MG/ML SOLN COMPARISON:  CT the abdomen and pelvis 01/05/2015. FINDINGS: Lower chest: Small calcified granuloma in the periphery of the right lower lobe. Hepatobiliary: No cystic or solid hepatic lesions. Status post cholecystectomy. Mild to moderate intrahepatic biliary ductal dilatation, similar to the prior examination. Common bile  duct measures 13 mm in the porta hepatis. No definite obstructive stone identified in the common bile duct. Pancreas: No pancreatic mass. No pancreatic ductal dilatation. No pancreatic or peripancreatic fluid or inflammatory changes. Spleen: Unremarkable. Adrenals/Urinary Tract: Sub cm low-attenuation lesion in the right kidney is too small to definitively characterize, but is unchanged, likely a tiny cyst. Left kidney and bilateral adrenal glands are normal in appearance. No hydroureteronephrosis. Urinary bladder is normal in appearance. Stomach/Bowel: Normal appearance of the stomach. No pathologic dilatation of small bowel or colon. There is extensive colonic diverticulosis. In the region of the proximal sigmoid colon there is extensive inflammation and mural thickening, presumably related to an acute diverticulitis. No discrete diverticular abscess is confidently identified at this time. No definite signs of frank perforation are identified. Extensive inflammatory changes in the fat of the sigmoid mesocolon are noted. Normal appendix. Vascular/Lymphatic: No significant atherosclerotic disease, aneurysm or dissection identified in the abdominal or pelvic vasculature. No lymphadenopathy noted in  the abdomen or pelvis. Reproductive: Status post hysterectomy. Ovaries are not confidently identified may be surgically absent or atrophic. Other: No significant volume of ascites.  No pneumoperitoneum. Musculoskeletal: There are no aggressive appearing lytic or blastic lesions noted in the visualized portions of the skeleton. IMPRESSION: 1. Findings, as above, compatible with an acute diverticulitis in the proximal sigmoid colon. No definite diverticular abscess or findings of frank perforation are identified at this time. 2. Normal appendix. 3. Intra and extrahepatic biliary ductal dilatation which is only slightly more extensive than the prior study, favored to reflect benign post cholecystectomy physiology. However,  correlation with liver function tests is recommended to exclude the possibility of biliary tract obstruction. 4. Mild atherosclerosis. These results were called by telephone at the time of interpretation on 05/16/2015 at 2:10 pm to Dr. Loren RacerAVID YELVERTON, who verbally acknowledged these results. Electronically Signed   By: Trudie Reedaniel  Entrikin M.D.   On: 05/16/2015 14:10    Assessment & Plan:   There are no diagnoses linked to this encounter. I am having Ms. Manson PasseyBrown maintain her aspirin, latanoprost, HYDROcodone-acetaminophen, traMADol, NEXIUM, cholestyramine, celecoxib, magnesium oxide, metroNIDAZOLE, ketoconazole, simethicone, Probiotic Product (PROBIOTIC DAILY PO), clonazePAM, magic mouthwash, levothyroxine, metoprolol, Vitamin D (Ergocalciferol), and esomeprazole.  No orders of the defined types were placed in this encounter.     Follow-up: No Follow-up on file.  Sonda PrimesAlex Imaad Reuss, MD

## 2015-09-18 ENCOUNTER — Telehealth: Payer: Self-pay | Admitting: Gastroenterology

## 2015-09-18 NOTE — Telephone Encounter (Signed)
Pt has GERD and bloating and some slight discomfort on the left side.  Not at all like the pain she had when she was seen in the hospital for diverticulitis.  Pt does not wish to see the extender's, she was given an appt with Dr Christella HartiganJacobs and was also put on the wait list.  She will call if symptoms develop.  She was taking nexium only once daily, she will increase to twice daily until appt.

## 2015-09-19 ENCOUNTER — Other Ambulatory Visit: Payer: Self-pay | Admitting: Internal Medicine

## 2015-10-15 LAB — HM MAMMOGRAPHY

## 2015-10-27 ENCOUNTER — Telehealth: Payer: Self-pay

## 2015-10-27 ENCOUNTER — Other Ambulatory Visit: Payer: Self-pay | Admitting: Gastroenterology

## 2015-10-27 NOTE — Telephone Encounter (Signed)
Patient is on the list for [employer] 2017 and may be a good candidate for an AWV in 2017. Please let me know if/when appt is scheduled.   

## 2015-10-29 NOTE — Telephone Encounter (Signed)
Call to fup on apt for AWV; STates she is out of town but will be back Wed; Plan to call back on Thursday and will schedule.

## 2015-11-01 NOTE — Telephone Encounter (Signed)
Call to Mrs. Manson PasseyBrown, she does not have access to her calender but will call back to my direct number next week w a couple of apt times. Will try to complete AWV next week

## 2015-11-02 ENCOUNTER — Encounter: Payer: Self-pay | Admitting: Geriatric Medicine

## 2015-11-07 NOTE — Telephone Encounter (Signed)
Call back today  AWV scheduled for 8/10 at 10am

## 2015-11-22 ENCOUNTER — Ambulatory Visit (INDEPENDENT_AMBULATORY_CARE_PROVIDER_SITE_OTHER): Payer: Medicare Other

## 2015-11-22 VITALS — BP 130/70 | Ht 61.0 in | Wt 166.2 lb

## 2015-11-22 DIAGNOSIS — Z0000 Encounter for general adult medical examination without abnormal findings: Secondary | ICD-10-CM

## 2015-11-22 NOTE — Patient Instructions (Addendum)
[name] , Thank you for taking time to come for your Medicare Wellness Visit. I appreciate your ongoing commitment to your health goals. Please review the following plan we discussed and let me know if I can assist you in the future.   Manuela Schwartz will try to get the Shingles date from [facility]  I will order the Hepatitis c for the next blood draw   Try to cut back on sugar!  Given information on Advanced Directive    These are the goals we discussed: Goals    . Exercise 150 minutes per week (moderate activity)          Bike and treadmill x 2 a week Will try to add an extra day 60 min x 3 days        This is a list of the screening recommended for you and due dates:  Health Maintenance  Topic Date Due  .  Hepatitis C: One time screening is recommended by Center for Disease Control  (CDC) for  adults born from [birth year] through 1964.   04/26/1945  . Shingles Vaccine  07/27/2006  . Flu Shot  11/13/2015  . Mammogram  10/14/2017  . Colon Cancer Screening  10/26/2021  . Tetanus Vaccine  09/15/2022  . DEXA scan (bone density measurement)  Completed  . Pneumonia vaccines  Completed     Fall Prevention in the Home  Falls can cause injuries. They can happen to people of all ages. There are many things you can do to make your home safe and to help prevent falls.  WHAT CAN I DO ON THE OUTSIDE OF MY HOME?  Regularly fix the edges of walkways and driveways and fix any cracks.  Remove anything that might make you trip as you walk through a door, such as a raised step or threshold.  Trim any bushes or trees on the path to your home.  Use bright outdoor lighting.  Clear any walking paths of anything that might make someone trip, such as rocks or tools.  Regularly check to see if handrails are loose or broken. Make sure that both sides of any steps have handrails.  Any raised decks and porches should have guardrails on the edges.  Have any leaves, snow, or ice cleared regularly.  Use sand  or salt on walking paths during winter.  Clean up any spills in your garage right away. This includes oil or grease spills. WHAT CAN I DO IN THE BATHROOM?   Use night lights.  Install grab bars by the toilet and in the tub and shower. Do not use towel bars as grab bars.  Use non-skid mats or decals in the tub or shower.  If you need to sit down in the shower, use a plastic, non-slip stool.  Keep the floor dry. Clean up any water that spills on the floor as soon as it happens.  Remove soap buildup in the tub or shower regularly.  Attach bath mats securely with double-sided non-slip rug tape.  Do not have throw rugs and other things on the floor that can make you trip. WHAT CAN I DO IN THE BEDROOM?  Use night lights.  Make sure that you have a light by your bed that is easy to reach.  Do not use any sheets or blankets that are too big for your bed. They should not hang down onto the floor.  Have a firm chair that has side arms. You can use this for support while you get  dressed.  Do not have throw rugs and other things on the floor that can make you trip. WHAT CAN I DO IN THE KITCHEN?  Clean up any spills right away.  Avoid walking on wet floors.  Keep items that you use a lot in easy-to-reach places.  If you need to reach something above you, use a strong step stool that has a grab bar.  Keep electrical cords out of the way.  Do not use floor polish or wax that makes floors slippery. If you must use wax, use non-skid floor wax.  Do not have throw rugs and other things on the floor that can make you trip. WHAT CAN I DO WITH MY STAIRS?  Do not leave any items on the stairs.  Make sure that there are handrails on both sides of the stairs and use them. Fix handrails that are broken or loose. Make sure that handrails are as long as the stairways.  Check any carpeting to make sure that it is firmly attached to the stairs. Fix any carpet that is loose or worn.  Avoid  having throw rugs at the top or bottom of the stairs. If you do have throw rugs, attach them to the floor with carpet tape.  Make sure that you have a light switch at the top of the stairs and the bottom of the stairs. If you do not have them, ask someone to add them for you. WHAT ELSE CAN I DO TO HELP PREVENT FALLS?  Wear shoes that:  Do not have high heels.  Have rubber bottoms.  Are comfortable and fit you well.  Are closed at the toe. Do not wear sandals.  If you use a stepladder:  Make sure that it is fully opened. Do not climb a closed stepladder.  Make sure that both sides of the stepladder are locked into place.  Ask someone to hold it for you, if possible.  Clearly mark and make sure that you can see:  Any grab bars or handrails.  First and last steps.  Where the edge of each step is.  Use tools that help you move around (mobility aids) if they are needed. These include:  Canes.  Walkers.  Scooters.  Crutches.  Turn on the lights when you go into a dark area. Replace any light bulbs as soon as they burn out.  Set up your furniture so you have a clear path. Avoid moving your furniture around.  If any of your floors are uneven, fix them.  If there are any pets around you, be aware of where they are.  Review your medicines with your doctor. Some medicines can make you feel dizzy. This can increase your chance of falling. Ask your doctor what other things that you can do to help prevent falls.   This information is not intended to replace advice given to you by your health care provider. Make sure you discuss any questions you have with your health care provider.   Document Released: 01/25/2009 Document Revised: 08/15/2014 Document Reviewed: 05/05/2014 Elsevier Interactive Patient Education 2016 [place] Maintenance, Female Adopting a healthy lifestyle and getting preventive care can go a long way to promote health and wellness. Talk with  your health care provider about what schedule of regular examinations is right for you. This is a good chance for you to check in with your provider about disease prevention and staying healthy. In between checkups, there are plenty of things you can do on  your own. Experts have done a lot of research about which lifestyle changes and preventive measures are most likely to keep you healthy. Ask your health care provider for more information. WEIGHT AND DIET  Eat a healthy diet  Be sure to include plenty of vegetables, fruits, low-fat dairy products, and lean protein.  Do not eat a lot of foods high in solid fats, added sugars, or salt.  Get regular exercise. This is one of the most important things you can do for your health.  Most adults should exercise for at least 150 minutes each week. The exercise should increase your heart rate and make you sweat (moderate-intensity exercise).  Most adults should also do strengthening exercises at least twice a week. This is in addition to the moderate-intensity exercise.  Maintain a healthy weight  Body mass index (BMI) is a measurement that can be used to identify possible weight problems. It estimates body fat based on height and weight. Your health care provider can help determine your BMI and help you achieve or maintain a healthy weight.  For females 82 years of age and older:   A BMI below 18.5 is considered underweight.  A BMI of 18.5 to 24.9 is normal.  A BMI of 25 to 29.9 is considered overweight.  A BMI of 30 and above is considered obese.  Watch levels of cholesterol and blood lipids  You should start having your blood tested for lipids and cholesterol at 69 years of age, then have this test every 5 years.  You may need to have your cholesterol levels checked more often if:  Your lipid or cholesterol levels are high.  You are older than 68 years of age.  You are at high risk for heart disease.  CANCER SCREENING   Lung  Cancer  Lung cancer screening is recommended for adults 67-17 years old who are at high risk for lung cancer because of a history of smoking.  A yearly low-dose CT scan of the lungs is recommended for people who:  Currently smoke.  Have quit within the past 15 years.  Have at least a 30-pack-year history of smoking. A pack year is smoking an average of one pack of cigarettes a day for 1 year.  Yearly screening should continue until it has been 15 years since you quit.  Yearly screening should stop if you develop a health problem that would prevent you from having lung cancer treatment.  Breast Cancer  Practice breast self-awareness. This means understanding how your breasts normally appear and feel.  It also means doing regular breast self-exams. Let your health care provider know about any changes, no matter how small.  If you are in your 20s or 30s, you should have a clinical breast exam (CBE) by a health care provider every 1-3 years as part of a regular health exam.  If you are 60 or older, have a CBE every year. Also consider having a breast X-ray (mammogram) every year.  If you have a family history of breast cancer, talk to your health care provider about genetic screening.  If you are at high risk for breast cancer, talk to your health care provider about having an MRI and a mammogram every year.  Breast cancer gene (BRCA) assessment is recommended for women who have family members with BRCA-related cancers. BRCA-related cancers include:  Breast.  Ovarian.  Tubal.  Peritoneal cancers.  Results of the assessment will determine the need for genetic counseling and BRCA1 and BRCA2 testing.  Cervical Cancer Your health care provider may recommend that you be screened regularly for cancer of the pelvic organs (ovaries, uterus, and vagina). This screening involves a pelvic examination, including checking for microscopic changes to the surface of your cervix (Pap test). You  may be encouraged to have this screening done every 3 years, beginning at age 15.  For women ages 24-65, health care providers may recommend pelvic exams and Pap testing every 3 years, or they may recommend the Pap and pelvic exam, combined with testing for human papilloma virus (HPV), every 5 years. Some types of HPV increase your risk of cervical cancer. Testing for HPV may also be done on women of any age with unclear Pap test results.  Other health care providers may not recommend any screening for nonpregnant women who are considered low risk for pelvic cancer and who do not have symptoms. Ask your health care provider if a screening pelvic exam is right for you.  If you have had past treatment for cervical cancer or a condition that could lead to cancer, you need Pap tests and screening for cancer for at least 20 years after your treatment. If Pap tests have been discontinued, your risk factors (such as having a new sexual partner) need to be reassessed to determine if screening should resume. Some women have medical problems that increase the chance of getting cervical cancer. In these cases, your health care provider may recommend more frequent screening and Pap tests. Colorectal Cancer  This type of cancer can be detected and often prevented.  Routine colorectal cancer screening usually begins at 69 years of age and continues through 69 years of age.  Your health care provider may recommend screening at an earlier age if you have risk factors for colon cancer.  Your health care provider may also recommend using home test kits to check for hidden blood in the stool.  A small camera at the end of a tube can be used to examine your colon directly (sigmoidoscopy or colonoscopy). This is done to check for the earliest forms of colorectal cancer.  Routine screening usually begins at age 15.  Direct examination of the colon should be repeated every 5-10 years through 69 years of age. However,  you may need to be screened more often if early forms of precancerous polyps or small growths are found. Skin Cancer  Check your skin from head to toe regularly.  Tell your health care provider about any new moles or changes in moles, especially if there is a change in a mole's shape or color.  Also tell your health care provider if you have a mole that is larger than the size of a pencil eraser.  Always use sunscreen. Apply sunscreen liberally and repeatedly throughout the day.  Protect yourself by wearing long sleeves, pants, a wide-brimmed hat, and sunglasses whenever you are outside. HEART DISEASE, DIABETES, AND HIGH BLOOD PRESSURE   High blood pressure causes heart disease and increases the risk of stroke. High blood pressure is more likely to develop in:  People who have blood pressure in the high end of the normal range (130-139/85-89 mm Hg).  People who are overweight or obese.  People who are African American.  If you are 85-72 years of age, have your blood pressure checked every 3-5 years. If you are 57 years of age or older, have your blood pressure checked every year. You should have your blood pressure measured twice--once when you are at a hospital or clinic,  and once when you are not at a hospital or clinic. Record the average of the two measurements. To check your blood pressure when you are not at a hospital or clinic, you can use:  An automated blood pressure machine at a pharmacy.  A home blood pressure monitor.  If you are between 24 years and 1 years old, ask your health care provider if you should take aspirin to prevent strokes.  Have regular diabetes screenings. This involves taking a blood sample to check your fasting blood sugar level.  If you are at a normal weight and have a low risk for diabetes, have this test once every three years after 69 years of age.  If you are overweight and have a high risk for diabetes, consider being tested at a younger age  or more often. PREVENTING INFECTION  Hepatitis B  If you have a higher risk for hepatitis B, you should be screened for this virus. You are considered at high risk for hepatitis B if:  You were born in a country where hepatitis B is common. Ask your health care provider which countries are considered high risk.  Your parents were born in a high-risk country, and you have not been immunized against hepatitis B (hepatitis B vaccine).  You have HIV or AIDS.  You use needles to inject street drugs.  You live with someone who has hepatitis B.  You have had sex with someone who has hepatitis B.  You get hemodialysis treatment.  You take certain medicines for conditions, including cancer, organ transplantation, and autoimmune conditions. Hepatitis C  Blood testing is recommended for:  Everyone born from [birth year] through 1964.  Anyone with known risk factors for hepatitis C. Sexually transmitted infections (STIs)  You should be screened for sexually transmitted infections (STIs) including gonorrhea and chlamydia if:  You are sexually active and are younger than 69 years of age.  You are older than 69 years of age and your health care provider tells you that you are at risk for this type of infection.  Your sexual activity has changed since you were last screened and you are at an increased risk for chlamydia or gonorrhea. Ask your health care provider if you are at risk.  If you do not have HIV, but are at risk, it may be recommended that you take a prescription medicine daily to prevent HIV infection. This is called pre-exposure prophylaxis (PrEP). You are considered at risk if:  You are sexually active and do not regularly use condoms or know the HIV status of your partner(s).  You take drugs by injection.  You are sexually active with a partner who has HIV. Talk with your health care provider about whether you are at high risk of being infected with HIV. If you choose to begin  PrEP, you should first be tested for HIV. You should then be tested every 3 months for as long as you are taking PrEP.  PREGNANCY   If you are premenopausal and you may become pregnant, ask your health care provider about preconception counseling.  If you may become pregnant, take 400 to 800 micrograms (mcg) of folic acid every day.  If you want to prevent pregnancy, talk to your health care provider about birth control (contraception). OSTEOPOROSIS AND MENOPAUSE   Osteoporosis is a disease in which the bones lose minerals and strength with aging. This can result in serious bone fractures. Your risk for osteoporosis can be identified using a bone density scan.  If you are 27 years of age or older, or if you are at risk for osteoporosis and fractures, ask your health care provider if you should be screened.  Ask your health care provider whether you should take a calcium or vitamin D supplement to lower your risk for osteoporosis.  Menopause may have certain physical symptoms and risks.  Hormone replacement therapy may reduce some of these symptoms and risks. Talk to your health care provider about whether hormone replacement therapy is right for you.  HOME CARE INSTRUCTIONS   Schedule regular health, dental, and eye exams.  Stay current with your immunizations.   Do not use any tobacco products including cigarettes, chewing tobacco, or electronic cigarettes.  If you are pregnant, do not drink alcohol.  If you are breastfeeding, limit how much and how often you drink alcohol.  Limit alcohol intake to no more than 1 drink per day for nonpregnant women. One drink equals 12 ounces of beer, 5 ounces of wine, or 1 ounces of hard liquor.  Do not use street drugs.  Do not share needles.  Ask your health care provider for help if you need support or information about quitting drugs.  Tell your health care provider if you often feel depressed.  Tell your health care provider if you  have ever been abused or do not feel safe at home.   This information is not intended to replace advice given to you by your health care provider. Make sure you discuss any questions you have with your health care provider.   Document Released: 10/14/2010 Document Revised: 04/21/2014 Document Reviewed: 03/02/2013 Elsevier Interactive Patient Education [employer].

## 2015-11-22 NOTE — Progress Notes (Signed)
Subjective:   Angela [name] is a 70 y.o. female who presents for Medicare Annual (Subsequent) preventive examination.   HRA assessment completed during this visit with ms. [employer] has good; celebrax for fibro; occasional pain med; dx x25 yo;  RH dx; hurts all over; Dr. Kriste Basque referred to Rh  Currently sees the ortho; Dr. Yisroel Ramming   Digestive symptoms; Will be following up with Dr. 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 Shawnee Knapp  Hormones; neurologist took her off on the spot Paroxetine; for hot flashes; came off hormones automatically GYN; Dr. Ilda Mori; ( was going to [facility])    The Patient was informed that the wellness visit is to identify future health risk and educate and initiate measures that can reduce risk for increased disease through the lifespan.     NO ROS; Medicare Wellness Visit Last OV:  08/2015 Labs completed: 03/2015 A1c 5.8; last fasting was not NPO  Psychosocial: (Family - Mitral valve prolapse) HTN; Stroke; DM )  Married 2 grown children  Grandchildren 1 and will be 4 on Saturday   Medications reviewed for issues; compliance; otc meds Doing better now coming off hormones   BMI: 29 Today she is 31.4; Weight fairly stable  The patient appears strong with good muscle tone. Body fat most likely less than BMI indicates   Diet;  low sodium diet attempted  Gave up pork; no barbecue; bacon, ham  Has tried to give up pastries;  No candy, cookies; pastries  Will try that again;  Will still drink sweet tea  Drink water / added water when she goes out   Dental exam: regular   Exercise;  [facility]; Stationary bike and treadmill x 2  Will attempt to increase by one day   HOME SAFETY: 2 level home;  Wears fit bit: 3000 steps / 5000 most  Age in place? Spouse former athelete and has knee issues and will probably move to one  level  Master on mail floor  Bathroom on main floor   Falls? No  Given education on "Fall Prevention in the Home" for more safety tips the patient can apply as appropriate.   Safety reviewe d Personal safety issues reviewed for risk such as safe community;  smoke detector; yes firearms safety if applicable;  protection when in the sun; yes driving safety for seniors or any recent accidents.no accidents  Urine or bowel inontinence;   Go more frequently;  Will discuss with doctor as there is medicine  Was somewhat disruptive on bus tour  Functional losses from last year to this year? No  Independent with ADL and IADL's    Risk for Depression reviewed: Any emotional problems? Anxious, depressed, irritable, sad or blue? No Denies feeling depressed or hopeless; voices pleasure in daily life How many social activities have you been engaged in within the last 2 weeks? no   Sleep; sleeps well   Cognitive; occasional forgetting  Manages checkbook, medications; no failures of task Ad8 score reviewed for issues;  Issues making decisions; no  Less interest in hobbies / activities" no  Repeats questions, stories; family complaining: NO  Trouble using ordinary gadgets; microwave; computer: no  Forgets the month or year: no  Mismanaging finances: no  Missing apt: no but does write them down  Daily problems with thinking of memory NO Ad8 score  is 0   Advanced Directive;  Yes completed and dtr is POA/ not written;  [place] offers free advance directive forms, as well as assistance in completing the forms themselves.  For assistance, contact the Spiritual Care Department at [phone], or the Clinical Social Work Department at [phone].     Counseling Health Maintenance Gaps:  Hepatitis C educated and will take next blood draw  Hearing: no   Ophthalmology exam annual/ Pre-glaucoma / taking drops     Immunizations Due: (Vaccines reviewed and educated  regarding any overdue)  [facility] [address]; had it within the last year / will call to get date  Established and updated Risk reviewed and appropriate referral made or health recommendations:  Current Care Team reviewed and updated   Cardiac Risk Factors include: advanced age (>64men, >77 women);dyslipidemia;family history of premature cardiovascular disease;hypertension     Objective:     Vitals: BP 130/70   Ht  (1.549 m)   Wt 166 lb 4 oz (75.4 kg)   BMI 31.41 kg/m   Body mass index is 31.41 kg/m.   Tobacco History  Smoking Status  . Never Smoker  Smokeless Tobacco  . Never Used     Counseling given: Not Answered   Past Medical History:  Diagnosis Date  . Acne rosacea   . Anxiety   . Bile salt-induced diarrhea   . Colon polyps   . Degenerative disc disease   . Diverticulitis 05/2015  . Diverticulosis 05/10/2004  . Duodenitis 04/25/2005  . Esophageal stricture 09/13/2007  . Esophagitis 04/25/2005  . External hemorrhoids 05/10/2004  . 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 Fibromyalgia   . Gastroparesis   . GERD (gastroesophageal reflux disease) 09/13/2007  . Glaucoma   . Hiatal hernia 04/25/2005  . Hyperlipidemia   . Hypertension   . Hypothyroidism   . IBS (irritable bowel syndrome)   . Migraine   . Osteoarthritis   . Other and unspecified noninfectious gastroenteritis and colitis(558.9)   . Sessile colonic polyp 05/30/2009  . Vertigo   . Vitamin D deficiency    Past Surgical History:  Procedure Laterality Date  . BUNIONECTOMY  09/04/2011   left  . CESAREAN SECTION    . CHOLECYSTECTOMY  1985  . COLONOSCOPY    . FOOT OSTEOTOMY  2006   Right foot  . TUBAL LIGATION  1983  . VAGINAL HYSTERECTOMY  1993   Family History  Problem Relation Age of Onset  . Mitral valve prolapse Mother   . Hypertension Father   . Colon polyps Father   . Stroke Father   . Hypertension Sister     x2  . Diabetes Sister   . Colon  cancer Maternal Aunt   . Stomach cancer Maternal Aunt   . Colon polyps Sister    History  Sexual Activity  . Sexual activity: Yes    Outpatient Encounter Prescriptions as of 11/22/2015  Medication Sig  . aspirin 81 MG tablet Take 81 mg by mouth daily.    . celecoxib (CELEBREX) 200 MG capsule TAKE 1 CAPSULE BY MOUTH EVERY DAY  . cholestyramine (QUESTRAN) 4 g packet TAKE 1 PACKET (4 G TOTAL) BY MOUTH DAILY.  . clonazePAM (KLONOPIN) 0.5 MG tablet 1 po bid as needed  . esomeprazole (NEXIUM) 40 MG capsule TAKE 1 CAPSULE (40 MG TOTAL) BY MOUTH 2 (TWO) TIMES DAILY BEFORE A MEAL.  Marland Kitchen HYDROcodone-acetaminophen (NORCO/VICODIN) 5-325 MG per tablet Take 1 tablet  by mouth 3 (three) times daily as needed.  Marland Kitchen ketoconazole (NIZORAL) 2 % cream Apply 1 application topically daily as needed for irritation.  Marland Kitchen latanoprost (XALATAN) 0.005 % ophthalmic solution Place 1 drop into both eyes at bedtime.  Marland Kitchen levothyroxine (SYNTHROID, LEVOTHROID) 50 MCG tablet TAKE 1/2 TABLET BY MOUTH DAILY  . magnesium oxide (MAG-OX) 400 MG tablet Take 400 mg by mouth every morning.  . metoprolol (LOPRESSOR) 50 MG tablet Take 1 tablet (50 mg total) by mouth 2 (two) times daily.  . metroNIDAZOLE (METROCREAM) 0.75 % cream Apply 1 application topically daily.  . Probiotic Product (PROBIOTIC DAILY PO) Take 1 capsule by mouth daily.  . simethicone (MYLICON) 125 MG chewable tablet Chew 125 mg by mouth every 6 (six) hours as needed for flatulence. Reported on 05/31/2015  . traMADol (ULTRAM) 50 MG tablet Take 1 tablet (50 mg total) by mouth 3 (three) times daily as needed (arthritis pain).  Marland Kitchen venlafaxine (EFFEXOR) 75 MG tablet Take 75 mg by mouth 1 day or 1 dose.  . Vitamin D, Ergocalciferol, (DRISDOL) 50000 units CAPS capsule TAKE 1 CAPSULE (50,000 UNITS TOTAL) BY MOUTH ONCE A WEEK.  Marland Kitchen acyclovir (ZOVIRAX) 400 MG tablet Take 1 tablet (400 mg total) by mouth 3 (three) times daily. (Patient not taking: Reported on 11/22/2015)  . magic mouthwash  SOLN Take 5 mLs by mouth 3 (three) times daily. (Patient not taking: Reported on 11/22/2015)  . NEXIUM 20 MG capsule TAKE 2 CAPSULES BY MOUTH EVERY DAY AT NOON**PA REQ** (Patient taking differently: TAKE 1 CAPSULE  BY MOUTH EVERY DAY AT NOON**PA REQ**)   No facility-administered encounter medications on file as of 11/22/2015.     Activities of Daily Living In your present state of health, do you have any difficulty performing the following activities: 11/22/2015 05/16/2015  Hearing? N N  Vision? N N  Difficulty concentrating or making decisions? N N  Walking or climbing stairs? N N  Dressing or bathing? N N  Doing errands, shopping? N N  Preparing Food and eating ? N -  Using the Toilet? N -  In the past six months, have you accidently leaked urine? Y -  Do you have problems with loss of bowel control? N -  Managing your Medications? N -  Managing your Finances? N -  Housekeeping or managing your Housekeeping? N -  Some recent data might be hidden    Patient Care Team: Myrlene Broker, MD as PCP - General (Internal Medicine)    Assessment:     [place] offers free advance directive forms, as well as assistance in completing the forms themselves.  For assistance, contact the Spiritual Care Department at [phone], or the Clinical Social Work Department at [phone].  Educated regarding Educated regarding prediabetes and numbers;  A1c ranges from 5.8 to 6.5 or fasting Blood sugar > 115 -126; (126 is diabetic)   Risk: Being less physically active than 30 minutes; 3 times a week;   Prevention; Losing a modest 7 to 8 lbs; If over 200 lbs; 10 to 14 lbs;  Choose healthier foods; colorful veggies; fish or lean meats; drinks water Reduce portion size Start exercising; 30 minutes of fast walking x 30 minutes per day/ 60 min for weight loss    Barriers to Success none noted; managed chronic conditions well   Will also try to eat less sugar  Exercise Activities and  Dietary recommendations Current Exercise Habits: Structured exercise class, Type of exercise: walking;Other - see comments, Time (  Minutes): 60, Frequency (Times/Week): 3, Weekly Exercise (Minutes/Week): 180, Intensity: Moderate  Goals    . Exercise 150 minutes per week (moderate activity)          Bike and treadmill x 2 a week Will try to add an extra day 60 min x 3 days     . Reduce sugar intake to X grams per day      Fall Risk Fall Risk  11/22/2015 05/31/2015 05/23/2014  Falls in the past year? No Yes No  Number falls in past yr: - 1 -  Injury with Fall? - No -   Depression Screen PHQ 2/9 Scores 11/22/2015 05/31/2015 05/23/2014  PHQ - 2 Score 0 0 0     Cognitive Testing No flowsheet data found.  Ad8 score is 0   Immunization History  Administered Date(s) Administered  . Influenza Split 02/18/2011, 02/16/2012  . Influenza Whole 02/12/2009, 05/21/2010  . Influenza, High Dose Seasonal PF 04/04/2015  . Influenza, Seasonal, Injecte, Preservative Fre 03/29/2014  . Influenza,inj,Quad PF,36+ Mos 01/27/2013  . Pneumococcal Conjugate-13 05/31/2015  . Pneumococcal Polysaccharide-23 03/29/2014  . Tdap 09/14/2012   Screening Tests Health Maintenance  Topic Date Due  . Hepatitis C Screening  09/05/1945  . ZOSTAVAX  07/27/2006  . INFLUENZA VACCINE  11/13/2015  . MAMMOGRAM  10/14/2017  . COLONOSCOPY  10/26/2021  . TETANUS/TDAP  09/15/2022  . DEXA SCAN  Completed  . PNA vac Low Risk Adult  Completed      Plan:   Darl PikesSusan will [facility]  I will order the Hepatitis c for the next blood draw   Try to cut back on sugar!  Given information on Advanced Directive    During the course of the visit the patient was educated and counseled about the following appropriate screening and preventive services:   Vaccines to include Pneumoccal, Influenza, Hepatitis B, Td, Zostavax, HCV  Electrocardiogram 05/2015  Cardiovascular Disease/   Colorectal cancer  screening 10/2011 due 10/2021  Was inpatient for diverticulitis   Bone density screening  10/2002/ minimal -0.5   Driscoll x 1  Per week   Diabetes screening 05/2015 FBS 126 / A1c was 5.8 in Dec 2016   Glaucoma screening  Mammography/ 10/2015   Nutrition counseling as noted   Patient Instructions (the written plan) was given to the patient.   Montine CircleHauck,Anushri Casalino, RN  11/22/2015

## 2015-11-23 NOTE — Progress Notes (Signed)
Medical screening examination/treatment/procedure(s) were performed by non-physician practitioner and as supervising physician I was immediately available for consultation/collaboration. I agree with above. Acasia Skilton A Advay Volante, MD 

## 2015-12-03 ENCOUNTER — Encounter: Payer: Self-pay | Admitting: Gastroenterology

## 2015-12-03 ENCOUNTER — Ambulatory Visit (INDEPENDENT_AMBULATORY_CARE_PROVIDER_SITE_OTHER): Payer: Medicare Other | Admitting: Gastroenterology

## 2015-12-03 VITALS — BP 120/70 | HR 76 | Ht 62.0 in | Wt 163.0 lb

## 2015-12-03 DIAGNOSIS — R1012 Left upper quadrant pain: Secondary | ICD-10-CM

## 2015-12-03 DIAGNOSIS — R140 Abdominal distension (gaseous): Secondary | ICD-10-CM

## 2015-12-03 NOTE — Progress Notes (Signed)
Review of pertinent gastrointestinal problems: 1. Routine risk for colon cancer:  Colonsocopy 10/2011 Dr. Jarold MottoPatterson, no polyps, he recommended repeat screening colonoscopy in 10 years 2. Extensive left sided diverticulosis, noted colonoscopy 2013; Acute sigmoid diverticulitis (confirmed on CT scan) 05/2015, also WBC 13K 3. Large HH. EGD 10/2011 Dr. Jarold MottoPatterson 4. Chronic intermittent LUQ discomfort, bulging sensation: eventual CT scan 12/2014 showed no findings to account for the bulge, symptoms.   HPI: This is a very pleasant 70 year old woman who I last saw about a year ago  Chief complaint is continued left upper quadrant discomfort, fullness. Also a lot of bloating and belching  Stays bloated all the time, a lot of belching.  She has mild nausea in the mornings and a lot of bloating and belching. She is not sure when she is post take her proton pump inhibitor because she has prescriptions stating different regimens. She has had no fevers or chills.  She is bothered by a protruding external hemorrhoid that will intermittently bleed. She has no constipation or straining. She will put Preparation H periodically with equivocal results.  Past Medical History:  Diagnosis Date  . Acne rosacea   . Anxiety   . Bile salt-induced diarrhea   . Colon polyps   . Degenerative disc disease   . Diverticulitis 05/2015  . Diverticulosis 05/10/2004  . Duodenitis 04/25/2005  . Esophageal stricture 09/13/2007  . Esophagitis 04/25/2005  . External hemorrhoids 05/10/2004  . 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 Fibromyalgia   . Gastroparesis   . GERD (gastroesophageal reflux disease) 09/13/2007  . Glaucoma   . Hiatal hernia 04/25/2005  . Hyperlipidemia   . Hypertension   . Hypothyroidism   . IBS (irritable bowel syndrome)   . Migraine   . Osteoarthritis   . Other and unspecified noninfectious gastroenteritis and colitis(558.9)   . Sessile colonic polyp 05/30/2009  . Vertigo   .  Vitamin D deficiency     Past Surgical History:  Procedure Laterality Date  . BUNIONECTOMY  09/04/2011   left  . CESAREAN SECTION    . CHOLECYSTECTOMY  1985  . COLONOSCOPY    . FOOT OSTEOTOMY  2006   Right foot  . TUBAL LIGATION  1983  . VAGINAL HYSTERECTOMY  1993    Current Outpatient Prescriptions  Medication Sig Dispense Refill  . aspirin 81 MG tablet Take 81 mg by mouth daily.      . celecoxib (CELEBREX) 200 MG capsule TAKE 1 CAPSULE BY MOUTH EVERY DAY 30 capsule 3  . cholestyramine (QUESTRAN) 4 g packet TAKE 1 PACKET (4 G TOTAL) BY MOUTH DAILY. 30 packet 6  . clonazePAM (KLONOPIN) 0.5 MG tablet 1 po bid as needed 20 tablet 0  . HYDROcodone-acetaminophen (NORCO/VICODIN) 5-325 MG per tablet Take 1 tablet by mouth 3 (three) times daily as needed. 90 tablet 0  . ketoconazole (NIZORAL) 2 % cream Apply 1 application topically daily as needed for irritation.    Marland Kitchen. latanoprost (XALATAN) 0.005 % ophthalmic solution Place 1 drop into both eyes at bedtime.    Marland Kitchen. levothyroxine (SYNTHROID, LEVOTHROID) 50 MCG tablet TAKE 1/2 TABLET BY MOUTH DAILY 30 tablet 6  . magnesium oxide (MAG-OX) 400 MG tablet Take 400 mg by mouth every morning.    . metoprolol (LOPRESSOR) 50 MG tablet Take 1 tablet (50 mg total) by mouth 2 (two) times daily. 60 tablet 11  . metroNIDAZOLE (METROCREAM) 0.75 % cream Apply 1 application topically daily.    .Marland Kitchen  NEXIUM 20 MG capsule TAKE 2 CAPSULES BY MOUTH EVERY DAY AT NOON**PA REQ** (Patient taking differently: TAKE 1 CAPSULE  BY MOUTH EVERY DAY AT NOON**PA REQ**) 60 capsule 0  . Probiotic Product (PROBIOTIC DAILY PO) Take 1 capsule by mouth daily.    . simethicone (MYLICON) 125 MG chewable tablet Chew 125 mg by mouth every 6 (six) hours as needed for flatulence. Reported on 05/31/2015    . traMADol (ULTRAM) 50 MG tablet Take 1 tablet (50 mg total) by mouth 3 (three) times daily as needed (arthritis pain). 90 tablet 1  . triamcinolone cream (KENALOG) 0.1 % Apply 1 application  topically 2 (two) times daily.    Marland Kitchen. venlafaxine (EFFEXOR) 75 MG tablet Take 75 mg by mouth 1 day or 1 dose.    . Vitamin D, Ergocalciferol, (DRISDOL) 50000 units CAPS capsule TAKE 1 CAPSULE (50,000 UNITS TOTAL) BY MOUTH ONCE A WEEK. 4 capsule 6   No current facility-administered medications for this visit.     Allergies as of 12/03/2015 - Review Complete 12/03/2015  Allergen Reaction Noted  . Tetracycline Other (See Comments)   . Codeine Nausea And Vomiting   . Paroxetine Rash 03/27/2015    Family History  Problem Relation Age of Onset  . Mitral valve prolapse Mother   . Hypertension Father   . Colon polyps Father   . Stroke Father   . Hypertension Sister     x2  . Diabetes Sister   . Colon cancer Maternal Aunt   . Stomach cancer Maternal Aunt   . Colon polyps Sister     Social History   Social History  . Marital status: Married    Spouse name: N/A  . Number of children: 2  . Years of education: N/A   Occupational History  . Retired    Social History Main Topics  . Smoking status: Never Smoker  . Smokeless tobacco: Never Used  . Alcohol use No  . Drug use: No  . Sexual activity: Yes   Other Topics Concern  . Not on file   Social History Narrative  . No narrative on file     Physical Exam: BP 120/70 (BP Location: Left Arm, Patient Position: Sitting, Cuff Size: Normal)   Pulse 76   Ht 5\' 2"  (1.575 m)   Wt 163 lb (73.9 kg)   BMI 29.81 kg/m  Constitutional: generally well-appearing Psychiatric: alert and oriented x3 Abdomen: soft, nontender, nondistended, no obvious ascites, no peritoneal signs, normal bowel sounds   Assessment and plan: 70 y.o. female with Bloating, fullness in the left upper quadrant subjectively, symptomatic external hemorrhoid  First I recommended general surgery referral to consider external hemorrhoid resection, removal. This bleeds intermittently and is difficult for her to clean and she does not have any constipation or  straining underlying this to treat. She has what has been called a large hiatal hernia and this can predispose her to bloating, dyspepsia, GERD. She is not sure what time she should be taking her proton pump inhibitor and we discussed that. I recommended she take Nexium 20-30 minute prior to her breakfast meal once daily. She is also going to begin taking H2 blocker ranitidine 150 mg at bedtime every night. She will call to report on her response to this in 4-6 weeks and sooner if needed.   Rob Buntinganiel Lex Linhares, MD [facility] 12/03/2015, 9:53 AM

## 2015-12-03 NOTE — Patient Instructions (Addendum)
You should change the way you are taking your antiacid medicine (nexium) so that you are taking it 20-30 minutes prior to your breakfast meal as that is the way the pill is designed to work most effectively. Please start ranitidine 150mg  pill, one pill at bedtime nightly.  This is available over the counter. Call in 4-6 weeks to report on your response. Call here if you think you have diverticulitis. Referral to general surgery for external hemorrhoids despite no constipation, straining.

## 2015-12-04 ENCOUNTER — Telehealth: Payer: Self-pay

## 2015-12-04 NOTE — Telephone Encounter (Signed)
You have been scheduled for an appointment with Dr [facility]. Your appointment is on 12/18/15 at 1:50 pm. Please arrive at 1:20 pm for registration. Make certain to bring a list of current medications, including any over the counter medications or vitamins. Also bring your co-pay if you have one as well as your insurance cards. [facility] is located at [address], [address]. Should you need to reschedule your appointment, please contact them at [phone].  The pt has been given all the appt info, she verbalized understanding and will call with any further questions.

## 2015-12-20 ENCOUNTER — Other Ambulatory Visit: Payer: Self-pay | Admitting: Gastroenterology

## 2015-12-28 ENCOUNTER — Other Ambulatory Visit: Payer: Self-pay | Admitting: Physician Assistant

## 2016-02-07 ENCOUNTER — Encounter: Payer: Self-pay | Admitting: Internal Medicine

## 2016-02-07 ENCOUNTER — Ambulatory Visit (INDEPENDENT_AMBULATORY_CARE_PROVIDER_SITE_OTHER): Payer: Medicare Other | Admitting: Internal Medicine

## 2016-02-07 VITALS — BP 128/68 | HR 67 | Temp 98.2°F | Wt 163.0 lb

## 2016-02-07 DIAGNOSIS — Z23 Encounter for immunization: Secondary | ICD-10-CM | POA: Diagnosis not present

## 2016-02-07 DIAGNOSIS — J309 Allergic rhinitis, unspecified: Secondary | ICD-10-CM

## 2016-02-07 DIAGNOSIS — J069 Acute upper respiratory infection, unspecified: Secondary | ICD-10-CM

## 2016-02-07 DIAGNOSIS — I10 Essential (primary) hypertension: Secondary | ICD-10-CM | POA: Diagnosis not present

## 2016-02-07 MED ORDER — AZITHROMYCIN 250 MG PO TABS: ORAL_TABLET | ORAL | 1 refills | Status: DC

## 2016-02-07 MED ORDER — PREDNISONE 10 MG PO TABS: ORAL_TABLET | ORAL | 0 refills | Status: DC

## 2016-02-07 MED ORDER — METHYLPREDNISOLONE ACETATE 80 MG/ML IJ SUSP
80.0000 mg | Freq: Once | INTRAMUSCULAR | Status: AC
  Administered 2016-02-07: 80 mg via INTRAMUSCULAR

## 2016-02-07 MED ORDER — HYDROCODONE-HOMATROPINE 5-1.5 MG/5ML PO SYRP: 5.0000 mL | ORAL_SOLUTION | Freq: Four times a day (QID) | ORAL | 0 refills | Status: AC | PRN

## 2016-02-07 NOTE — Progress Notes (Signed)
Pre visit review using our clinic review tool, if applicable. No additional management support is needed unless otherwise documented below in the visit note. 

## 2016-02-07 NOTE — Assessment & Plan Note (Signed)
stable overall by history and exam, recent data reviewed with pt, and pt to continue medical treatment as before,  to f/u any worsening symptoms or concerns BP Readings from Last 3 Encounters:  02/07/16 128/68  12/03/15 120/70  11/22/15 130/70

## 2016-02-07 NOTE — Progress Notes (Signed)
Subjective:    Patient ID: Angela [name], female    DOB: 09/15/1945, 70 y.o.   MRN: [id number]  HPI   Here with 2-3 days acute onset fever, pressure, headache, general weakness and malaise, and greenish d/c, with mild ST and cough, but pt denies chest pain, wheezing, increased sob or doe, orthopnea, PND, increased LE swelling, palpitations, dizziness or syncope. Does have several wks ongoing nasal allergy symptoms with clearish congestion, itch and sneezing, without fever, pain, ST, cough, swelling or wheezing. Pt denies new neurological symptoms such as new headache, or facial or extremity weakness or numbness Past Medical History:  Diagnosis Date  . Acne rosacea   . Anxiety   . Bile salt-induced diarrhea   . Colon polyps   . Degenerative disc disease   . Diverticulitis 05/2015  . Diverticulosis 05/10/2004  . Duodenitis 04/25/2005  . Esophageal stricture 09/13/2007  . Esophagitis 04/25/2005  . External hemorrhoids 05/10/2004  . 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 Fibromyalgia   . Gastroparesis   . GERD (gastroesophageal reflux disease) 09/13/2007  . Glaucoma   . Hiatal hernia 04/25/2005  . Hyperlipidemia   . Hypertension   . Hypothyroidism   . IBS (irritable bowel syndrome)   . Migraine   . Osteoarthritis   . Other and unspecified noninfectious gastroenteritis and colitis(558.9)   . Sessile colonic polyp 05/30/2009  . Vertigo   . Vitamin D deficiency    Past Surgical History:  Procedure Laterality Date  . BUNIONECTOMY  09/04/2011   left  . CESAREAN SECTION    . CHOLECYSTECTOMY  1985  . COLONOSCOPY    . FOOT OSTEOTOMY  2006   Right foot  . TUBAL LIGATION  1983  . VAGINAL HYSTERECTOMY  1993    reports that she has never smoked. She has never used smokeless tobacco. She reports that she does not drink alcohol or use drugs. family history includes Colon cancer in her maternal aunt; Colon polyps in her father and sister; Diabetes in her sister;  Hypertension in her father and sister; Mitral valve prolapse in her mother; Stomach cancer in her maternal aunt; Stroke in her father. Allergies  Allergen Reactions  . Tetracycline Other (See Comments)    GI bleed  . Codeine Nausea And Vomiting    GI upset/vomit  . Paroxetine Rash   Current Outpatient Prescriptions on File Prior to Visit  Medication Sig Dispense Refill  . aspirin 81 MG tablet Take 81 mg by mouth daily.      . celecoxib (CELEBREX) 200 MG capsule TAKE 1 CAPSULE BY MOUTH EVERY DAY 30 capsule 3  . cholestyramine (QUESTRAN) 4 g packet TAKE 1 PACKET (4 G TOTAL) BY MOUTH DAILY. 30 packet 6  . clonazePAM (KLONOPIN) 0.5 MG tablet 1 po bid as needed 20 tablet 0  . HYDROcodone-acetaminophen (NORCO/VICODIN) 5-325 MG per tablet Take 1 tablet by mouth 3 (three) times daily as needed. 90 tablet 0  . ketoconazole (NIZORAL) 2 % cream Apply 1 application topically daily as needed for irritation.    Marland Kitchen. latanoprost (XALATAN) 0.005 % ophthalmic solution Place 1 drop into both eyes at bedtime.    Marland Kitchen. levothyroxine (SYNTHROID, LEVOTHROID) 50 MCG tablet TAKE 1/2 TABLET BY MOUTH DAILY 30 tablet 6  . magnesium oxide (MAG-OX) 400 MG tablet Take 400 mg by mouth every morning.    . metoprolol (LOPRESSOR) 50 MG tablet Take 1 tablet (50 mg total) by mouth 2 (two) times daily. 60  tablet 11  . metroNIDAZOLE (METROCREAM) 0.75 % cream Apply 1 application topically daily.    Marland Kitchen NEXIUM 20 MG capsule TAKE 2 CAPSULES BY MOUTH EVERY DAY AT NOON**PA REQ** (Patient taking differently: TAKE 1 CAPSULE  BY MOUTH EVERY DAY AT NOON**PA REQ**) 60 capsule 0  . Probiotic Product (PROBIOTIC DAILY PO) Take 1 capsule by mouth daily.    . simethicone (MYLICON) 125 MG chewable tablet Chew 125 mg by mouth every 6 (six) hours as needed for flatulence. Reported on 05/31/2015    . traMADol (ULTRAM) 50 MG tablet Take 1 tablet (50 mg total) by mouth 3 (three) times daily as needed (arthritis pain). 90 tablet 1  . triamcinolone cream  (KENALOG) 0.1 % Apply 1 application topically 2 (two) times daily.    Marland Kitchen venlafaxine (EFFEXOR) 75 MG tablet Take 75 mg by mouth 1 day or 1 dose.    . Vitamin D, Ergocalciferol, (DRISDOL) 50000 units CAPS capsule TAKE 1 CAPSULE (50,000 UNITS TOTAL) BY MOUTH ONCE A WEEK. 4 capsule 6  . esomeprazole (NEXIUM) 40 MG capsule Take 1 capsule (40 mg total) by mouth 2 (two) times daily before a meal. (Patient not taking: Reported on 02/07/2016) 60 capsule 8   No current facility-administered medications on file prior to visit.    Review of Systems  Constitutional: Negative for unusual diaphoresis or night sweats HENT: Negative for ear swelling or discharge Eyes: Negative for worsening visual haziness  Respiratory: Negative for choking and stridor.   Gastrointestinal: Negative for distension or worsening eructation Genitourinary: Negative for retention or change in urine volume.  Musculoskeletal: Negative for other MSK pain or swelling Skin: Negative for color change and worsening wound Neurological: Negative for tremors and numbness other than noted  Psychiatric/Behavioral: Negative for decreased concentration or agitation other than above   All o/w neg per pt    Objective:   Physical Exam BP 128/68   Pulse 67   Temp 98.2 F (36.8 C)   Wt 163 lb (73.9 kg)   SpO2 98%   BMI 29.81 kg/m  VS noted, mild ill Constitutional: Pt appears in no apparent distress HENT: Head: NCAT.  Right Ear: External ear normal.  Left Ear: External ear normal.  Bilat tm's with mild erythema.  Max sinus areas non tender.  Pharynx with mild erythema, no exudate Eyes: . Pupils are equal, round, and reactive to light. Conjunctivae and EOM are normal Neck: Normal range of motion. Neck supple. but with bilat submandib LA mild tender Cardiovascular: Normal rate and regular rhythm.   Pulmonary/Chest: Effort normal and breath sounds without rales or wheezing.  Abd:  Soft, NT, ND, + BS Neurological: Pt is alert. Not  confused , motor grossly intact Skin: Skin is warm. No rash, no LE edema Psychiatric: Pt behavior is normal. No agitation.      Assessment & Plan:

## 2016-02-07 NOTE — Addendum Note (Signed)
Addended by: Alonna MiniumWILLIAMS, TAMARA P on: 02/07/2016 06:04 PM   Modules accepted: Orders

## 2016-02-07 NOTE — Assessment & Plan Note (Addendum)
Mild to mod, for antibx course, cough med prn, to f/u any worsening symptoms or concerns 

## 2016-02-07 NOTE — Patient Instructions (Addendum)
You had the steroid shot today, and the flu shot  Please take all new medication as prescribed - the antibiotic, cough medicine, and prednisone  Please continue all other medications as before, and refills have been done if requested.  Please have the pharmacy call with any other refills you may need.  Please keep your appointments with your specialists as you may have planned

## 2016-02-07 NOTE — Assessment & Plan Note (Addendum)
Mild to mod seasonal flare, for depomedrol IM, then otc zyrtec prn, predpac asd, to f/u any worsening symptoms or concerns

## 2016-02-18 ENCOUNTER — Other Ambulatory Visit: Payer: Self-pay | Admitting: Internal Medicine

## 2016-04-20 ENCOUNTER — Other Ambulatory Visit: Payer: Self-pay | Admitting: Internal Medicine

## 2016-06-27 ENCOUNTER — Encounter: Payer: Self-pay | Admitting: Internal Medicine

## 2016-06-27 ENCOUNTER — Other Ambulatory Visit (INDEPENDENT_AMBULATORY_CARE_PROVIDER_SITE_OTHER): Payer: Medicare Other

## 2016-06-27 ENCOUNTER — Ambulatory Visit (INDEPENDENT_AMBULATORY_CARE_PROVIDER_SITE_OTHER): Payer: Medicare Other | Admitting: Internal Medicine

## 2016-06-27 VITALS — BP 118/76 | HR 89 | Temp 98.2°F | Ht 62.0 in | Wt 170.0 lb

## 2016-06-27 DIAGNOSIS — R5383 Other fatigue: Secondary | ICD-10-CM

## 2016-06-27 LAB — COMPREHENSIVE METABOLIC PANEL
ALT: 18 U/L (ref 0–35)
AST: 19 U/L (ref 0–37)
Albumin: 4.1 g/dL (ref 3.5–5.2)
Alkaline Phosphatase: 86 U/L (ref 39–117)
BILIRUBIN TOTAL: 0.3 mg/dL (ref 0.2–1.2)
BUN: 17 mg/dL (ref 6–23)
CALCIUM: 9.6 mg/dL (ref 8.4–10.5)
CHLORIDE: 102 meq/L (ref 96–112)
CO2: 26 meq/L (ref 19–32)
Creatinine, Ser: 0.8 mg/dL (ref 0.40–1.20)
GFR: 91.22 mL/min (ref >60.00)
GLUCOSE: 116 mg/dL — AB (ref 70–99)
POTASSIUM: 3.8 meq/L (ref 3.5–5.1)
Sodium: 136 mEq/L (ref 135–145)
Total Protein: 7.1 g/dL (ref 6.0–8.3)

## 2016-06-27 LAB — CBC
HCT: 37.5 % (ref 36.0–46.0)
Hemoglobin: 12.9 g/dL (ref 12.0–15.0)
MCHC: 34.5 g/dL (ref 30.0–36.0)
MCV: 92.1 fl (ref 78.0–100.0)
Platelets: 341 10*3/uL (ref 150.0–400.0)
RBC: 4.07 Mil/uL (ref 3.87–5.11)
RDW: 13.7 % (ref 11.5–15.5)
WBC: 6.1 10*3/uL (ref 4.0–10.5)

## 2016-06-27 LAB — TSH: TSH: 0.62 u[IU]/mL (ref 0.35–4.50)

## 2016-06-27 LAB — VITAMIN D 25 HYDROXY (VIT D DEFICIENCY, FRACTURES): VITD: 44.19 ng/mL (ref 30.00–100.00)

## 2016-06-27 LAB — T4, FREE: Free T4: 0.61 ng/dL (ref 0.60–1.60)

## 2016-06-27 LAB — LIPASE: LIPASE: 35 U/L (ref 11.0–59.0)

## 2016-06-27 LAB — HEMOGLOBIN A1C: HEMOGLOBIN A1C: 6 % (ref 4.6–6.5)

## 2016-06-27 LAB — VITAMIN B12: Vitamin B-12: 777 pg/mL (ref 211–911)

## 2016-06-27 LAB — FERRITIN: FERRITIN: 39.9 ng/mL (ref 10.0–291.0)

## 2016-06-27 NOTE — Patient Instructions (Signed)
We will check the blood work and come up with a plan based on what you need.

## 2016-06-27 NOTE — Progress Notes (Signed)
   Subjective:    Patient ID: Angela [name], female    DOB: Mar 05, 1946, 71 y.o.   MRN: [id number]  HPI The patient is a 71 YO female coming in for fatigue and not feeling right. She is having some mild dizziness. She has had vertigo in the past and this feels different. She feels that things are not quite stable. Denies fevers or chills or recent viral infection. No change to her medications. Had some mild sinus symptoms and took some otc mucinex which was helpful and she does not have those symptoms any more. She denies weight change or new lumps or bumps. No new rash. No dosage changes. No chest pains, numbness, weakness. Her fibromyalgia has been worse over the winter.   Review of Systems  Constitutional: Positive for fatigue. Negative for activity change, appetite change, fever and unexpected weight change.  HENT: Negative.   Eyes: Negative.   Respiratory: Negative.   Cardiovascular: Negative.   Gastrointestinal: Positive for abdominal distention. Negative for abdominal pain, blood in stool, constipation, diarrhea, nausea and vomiting.  Musculoskeletal: Positive for myalgias. Negative for arthralgias, back pain, gait problem and joint swelling.  Skin: Negative.   Neurological: Positive for dizziness. Negative for tremors, syncope, facial asymmetry, weakness, light-headedness, numbness and headaches.  Psychiatric/Behavioral: Negative.       Objective:   Physical Exam  Constitutional: She is oriented to person, place, and time. She appears well-developed and well-nourished.  HENT:  Head: Normocephalic and atraumatic.  Right Ear: External ear normal.  Left Ear: External ear normal.  Nose: Nose normal.  Mouth/Throat: Oropharynx is clear and moist.  Eyes: EOM are normal.  Neck: Normal range of motion. No JVD present. No thyromegaly present.  Cardiovascular: Normal rate and regular rhythm.   Pulmonary/Chest: Effort normal and breath sounds normal. No respiratory distress. She has no  wheezes. She has no rales. She exhibits no tenderness.  Abdominal: Soft. Bowel sounds are normal. She exhibits no distension. There is no tenderness. There is no rebound and no guarding.  Musculoskeletal: She exhibits no edema.  Lymphadenopathy:    She has no cervical adenopathy.  Neurological: She is alert and oriented to person, place, and time. Coordination normal.  Skin: Skin is warm and dry.  Psychiatric: She has a normal mood and affect.   Vitals:   06/27/16 1040  BP: 118/76  Pulse: 89  Temp: 98.2 F (36.8 C)  TempSrc: Oral  SpO2: 99%  Weight: 170 lb (77.1 kg)  Height: 5\' 2"  (1.575 m)      Assessment & Plan:

## 2016-06-27 NOTE — Progress Notes (Signed)
Pre visit review using our clinic review tool, if applicable. No additional management support is needed unless otherwise documented below in the visit note. 

## 2016-06-27 NOTE — Assessment & Plan Note (Signed)
Known hypothyroidism without follow up in some time. Checking TSH and free T4, also known impaired sugars so checking HgA1c. Will check CBC, lipase, CMP, B12, ferritin, vitamin D to rule out other metabolic etiology.

## 2016-07-01 ENCOUNTER — Encounter: Payer: Self-pay | Admitting: Physician Assistant

## 2016-07-01 ENCOUNTER — Other Ambulatory Visit: Payer: Self-pay | Admitting: Emergency Medicine

## 2016-07-01 ENCOUNTER — Ambulatory Visit (INDEPENDENT_AMBULATORY_CARE_PROVIDER_SITE_OTHER): Payer: Medicare Other | Admitting: Physician Assistant

## 2016-07-01 ENCOUNTER — Other Ambulatory Visit: Payer: Self-pay | Admitting: Internal Medicine

## 2016-07-01 VITALS — BP 110/86 | HR 76 | Ht 61.0 in | Wt 170.2 lb

## 2016-07-01 DIAGNOSIS — K589 Irritable bowel syndrome without diarrhea: Secondary | ICD-10-CM | POA: Diagnosis not present

## 2016-07-01 DIAGNOSIS — R140 Abdominal distension (gaseous): Secondary | ICD-10-CM | POA: Diagnosis not present

## 2016-07-01 DIAGNOSIS — K449 Diaphragmatic hernia without obstruction or gangrene: Secondary | ICD-10-CM

## 2016-07-01 DIAGNOSIS — R109 Unspecified abdominal pain: Secondary | ICD-10-CM | POA: Diagnosis not present

## 2016-07-01 MED ORDER — RANITIDINE HCL 150 MG PO TABS: 150.0000 mg | ORAL_TABLET | Freq: Every day | ORAL | 3 refills | Status: DC

## 2016-07-01 NOTE — Patient Instructions (Addendum)
Please purchase the following medications over the counter and take as directed:  FD gard sample 2 tabs twice a day   We have sent the following medications to your pharmacy for you to pick up at your convenience: Zantac 150 mg at bedtime  Continue Nexium 20 mg daily. 30-60 mins before breakfast.

## 2016-07-01 NOTE — Progress Notes (Signed)
Chief Complaint: Bloating, left upper quadrant Fullness, Hiatal Hernia  Review of pertinent gastrointestinal problems: 1. Routine risk for colon cancer:  Colonsocopy 10/2011 Dr. Jarold Motto, no polyps, he recommended repeat screening colonoscopy in 10 years 2. Extensive left sided diverticulosis, noted colonoscopy 2013; Acute sigmoid diverticulitis (confirmed on CT scan) 05/2015, also WBC 13K 3. Large HH. EGD 10/2011 Dr. Jarold Motto 4. Chronic intermittent LUQ discomfort, bulging sensation: eventual CT scan 12/2014 showed no findings to account for the bulge, symptoms.   HPI:  Angela [name] is a 71 year old female with a past medical history as below, who returns to clinic today for a continued complaint of left upper quadrant fullness and a feeling of bloating and belching.   Please recall patient regularly follows with Dr. Christella Hartigan and was last seen 12/03/15 for the very same complaints. At that time it was recommended she take her Nexium 20-30 minutes prior to breakfast and start Ranitidine 150 mg at bedtime. It was discussed that she had a large hiatal hernia which could predispose her to bloating, dyspepsia and GERD.   Today, the patient returns to clinic and tells me that her symptoms continue. She continues to feel a bloating and fullness which leads to a lot of belching and gas. Patient tells me she eats or drinks something she hears  and feels a "constant rumbling" all over her abdomen, worse if I lay down. She does report that she feels a left upper quadrant "bulge", during times of extreme bloating which is "almost every day". Eventually release of gas via belching typically will relieve this symptom. These symptoms are so bad that the patient will stay home if she feels bloated at all for fear of gas release. Patient is currently taking her Nexium 20 mg 30-60 minutes before breakfast. She is also on a probiotic as well as cholestyramine which she reports does help her diarrhea. She recalls being placed  on Ranitidine 150 mg at night but does not remember if this helped or not and then ran out of this medication.   Patient denies fever, chills, blood in her stool, melena, weight loss, fatigue, anorexia, nausea, vomiting, heartburn or reflux.   Past Medical History:  Diagnosis Date  . Acne rosacea   . Anxiety   . Bile salt-induced diarrhea   . Colon polyps   . Degenerative disc disease   . Diverticulitis 05/2015  . Diverticulosis 05/10/2004  . Duodenitis 04/25/2005  . Esophageal stricture 09/13/2007  . Esophagitis 04/25/2005  . External hemorrhoids 05/10/2004  . 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 Fibromyalgia   . Gastroparesis   . GERD (gastroesophageal reflux disease) 09/13/2007  . Glaucoma   . Hiatal hernia 04/25/2005  . Hyperlipidemia   . Hypertension   . Hypothyroidism   . IBS (irritable bowel syndrome)   . Migraine   . Osteoarthritis   . Other and unspecified noninfectious gastroenteritis and colitis(558.9)   . Sessile colonic polyp 05/30/2009  . Vertigo   . Vitamin D deficiency     Past Surgical History:  Procedure Laterality Date  . BUNIONECTOMY  09/04/2011   left  . CESAREAN SECTION    . CHOLECYSTECTOMY  1985  . COLONOSCOPY    . FOOT OSTEOTOMY  2006   Right foot  . TUBAL LIGATION  1983  . VAGINAL HYSTERECTOMY  1993    Current Outpatient Prescriptions  Medication Sig Dispense Refill  . aspirin 81 MG tablet Take 81 mg by mouth daily.      Marland Kitchen  celecoxib (CELEBREX) 200 MG capsule TAKE 1 CAPSULE BY MOUTH EVERY DAY 30 capsule 3  . cholestyramine (QUESTRAN) 4 g packet TAKE 1 PACKET (4 G TOTAL) BY MOUTH DAILY. 30 packet 6  . clonazePAM (KLONOPIN) 0.5 MG tablet 1 po bid as needed 20 tablet 0  . esomeprazole (NEXIUM) 40 MG capsule Take 1 capsule (40 mg total) by mouth 2 (two) times daily before a meal. 60 capsule 8  . HYDROcodone-acetaminophen (NORCO/VICODIN) 5-325 MG per tablet Take 1 tablet by mouth 3 (three) times daily as needed. 90 tablet 0    . ketoconazole (NIZORAL) 2 % cream Apply 1 application topically daily as needed for irritation.    Marland Kitchen. latanoprost (XALATAN) 0.005 % ophthalmic solution Place 1 drop into both eyes at bedtime.    Marland Kitchen. levothyroxine (SYNTHROID, LEVOTHROID) 50 MCG tablet TAKE 1/2 TABLET BY MOUTH DAILY 30 tablet 5  . magnesium oxide (MAG-OX) 400 MG tablet Take 400 mg by mouth every morning.    . metoprolol (LOPRESSOR) 50 MG tablet Take 1 tablet (50 mg total) by mouth 2 (two) times daily. 60 tablet 11  . metroNIDAZOLE (METROCREAM) 0.75 % cream Apply 1 application topically daily.    Marland Kitchen. NEXIUM 20 MG capsule TAKE 2 CAPSULES BY MOUTH EVERY DAY AT NOON**PA REQ** (Patient taking differently: TAKE 1 CAPSULE  BY MOUTH EVERY DAY AT NOON**PA REQ**) 60 capsule 0  . Probiotic Product (PROBIOTIC DAILY PO) Take 1 capsule by mouth daily.    . traMADol (ULTRAM) 50 MG tablet Take 1 tablet (50 mg total) by mouth 3 (three) times daily as needed (arthritis pain). 90 tablet 1  . triamcinolone cream (KENALOG) 0.1 % Apply 1 application topically 2 (two) times daily.    Marland Kitchen. venlafaxine (EFFEXOR) 75 MG tablet Take 75 mg by mouth 1 day or 1 dose.    . Vitamin D, Ergocalciferol, (DRISDOL) 50000 units CAPS capsule TAKE 1 CAPSULE (50,000 UNITS TOTAL) BY MOUTH ONCE A WEEK. 12 capsule 1  . ranitidine (ZANTAC) 150 MG tablet Take 150 mg by mouth 2 (two) times daily.    . simethicone (MYLICON) 125 MG chewable tablet Chew 125 mg by mouth every 6 (six) hours as needed for flatulence. Reported on 05/31/2015     No current facility-administered medications for this visit.     Allergies as of 07/01/2016 - Review Complete 07/01/2016  Allergen Reaction Noted  . Tetracycline Other (See Comments)   . Codeine Nausea And Vomiting   . Paroxetine Rash 03/27/2015    Family History  Problem Relation Age of Onset  . Mitral valve prolapse Mother   . Hypertension Father   . Colon polyps Father   . Stroke Father   . Hypertension Sister     x2  . Diabetes Sister    . Colon cancer Maternal Aunt   . Stomach cancer Maternal Aunt   . Colon polyps Sister     Social History   Social History  . Marital status: Married    Spouse name: N/A  . Number of children: 2  . Years of education: N/A   Occupational History  . Retired    Social History Main Topics  . Smoking status: Never Smoker  . Smokeless tobacco: Never Used  . Alcohol use No  . Drug use: No  . Sexual activity: Yes   Other Topics Concern  . Not on file   Social History Narrative  . No narrative on file    Review of Systems:    Constitutional: No  weight loss, fever, chills, weakness or fatigue Cardiovascular: No chest pain Respiratory: No SOB  Gastrointestinal: See HPI and otherwise negative   Physical Exam:  Vital signs: BP 110/86   Pulse 76   Ht 5\' 1"  (1.549 m)   Wt 170 lb 4 oz (77.2 kg)   BMI 32.17 kg/m   Constitutional:   Pleasant female appears to be in NAD, Well developed, Well nourished, alert and cooperative Respiratory: Respirations even and unlabored. Lungs clear to auscultation bilaterally.   No wheezes, crackles, or rhonchi.  Cardiovascular: Normal S1, S2. No MRG. Regular rate and rhythm. No peripheral edema, cyanosis or pallor.  Gastrointestinal:  Soft, nondistended, nontender. No rebound or guarding. Normal bowel sounds. No appreciable masses or hepatomegaly. Psychiatric:Demonstrates good judgement and reason without abnormal affect or behaviors.  MOST RECENT LABS: CBC    Component Value Date/Time   WBC 6.1 06/27/2016 1101   RBC 4.07 06/27/2016 1101   HGB 12.9 06/27/2016 1101   HCT 37.5 06/27/2016 1101   PLT 341.0 06/27/2016 1101   MCV 92.1 06/27/2016 1101   MCH 30.4 05/21/2015 0518   MCHC 34.5 06/27/2016 1101   RDW 13.7 06/27/2016 1101   LYMPHSABS 2.7 05/16/2015 1210   MONOABS 1.8 (H) 05/16/2015 1210   EOSABS 0.3 05/16/2015 1210   BASOSABS 0.0 05/16/2015 1210    CMP     Component Value Date/Time   NA 136 06/27/2016 1101   K 3.8  06/27/2016 1101   CL 102 06/27/2016 1101   CO2 26 06/27/2016 1101   GLUCOSE 116 (H) 06/27/2016 1101   BUN 17 06/27/2016 1101   CREATININE 0.80 06/27/2016 1101   CALCIUM 9.6 06/27/2016 1101   PROT 7.1 06/27/2016 1101   ALBUMIN 4.1 06/27/2016 1101   AST 19 06/27/2016 1101   ALT 18 06/27/2016 1101   ALKPHOS 86 06/27/2016 1101   BILITOT 0.3 06/27/2016 1101   GFRNONAA >60 05/21/2015 0518   GFRAA >60 05/21/2015 0518    Assessment: 1. Gas/bloating: Continues per patient, at last visit Zantac was added 150 mg at bedtime, patient no longer taking this, likely this is a combination of diet and lifestyle as well as IBS and large hiatal hernia 2. Abdominal discomfort: Continues, likely related to bloating and possibly related to hiatal hernia 3. Hiatal hernia: Large, identified on last EGD, likely contributing to above  Plan: 1. Reviewed her last visit with Dr. Christella Hartigan and discussion regarding hiatal hernia and how this can contribute to her symptoms. 2. Recommend the patient continue her Nexium 20 mg daily, 30-60 minutes before breakfast. 3. We will restart her Ranitidine 150 mg daily at bedtime as prescribed by Dr. Christella Hartigan at time of last visit 4. Started the patient on FD Guard 2 tabs twice a day, did provide her with samples of this. Recommend she substitute this for her daily probiotic. 5. Discussed that the patient should limit her intake of gas producing foods and try to limit drinking from straws and/or chewing gum to decrease gas in her system. 6. Patient to return to clinic in 3-4 weeks with myself or Dr. Philis Fendt, PA-C [facility] 07/01/2016, 11:45 AM  Cc: Myrlene Broker, *

## 2016-07-01 NOTE — Progress Notes (Signed)
I agree with the above note, plan 

## 2016-07-02 ENCOUNTER — Other Ambulatory Visit: Payer: Self-pay | Admitting: Internal Medicine

## 2016-07-22 ENCOUNTER — Ambulatory Visit (INDEPENDENT_AMBULATORY_CARE_PROVIDER_SITE_OTHER): Payer: Medicare Other | Admitting: Physician Assistant

## 2016-07-22 ENCOUNTER — Encounter: Payer: Self-pay | Admitting: Physician Assistant

## 2016-07-22 VITALS — BP 106/70 | HR 72 | Ht 61.0 in | Wt 166.0 lb

## 2016-07-22 DIAGNOSIS — K219 Gastro-esophageal reflux disease without esophagitis: Secondary | ICD-10-CM | POA: Diagnosis not present

## 2016-07-22 DIAGNOSIS — R140 Abdominal distension (gaseous): Secondary | ICD-10-CM

## 2016-07-22 DIAGNOSIS — K589 Irritable bowel syndrome without diarrhea: Secondary | ICD-10-CM | POA: Diagnosis not present

## 2016-07-22 DIAGNOSIS — K648 Other hemorrhoids: Secondary | ICD-10-CM

## 2016-07-22 MED ORDER — ESOMEPRAZOLE MAGNESIUM 20 MG PO CPDR: DELAYED_RELEASE_CAPSULE | ORAL | 3 refills | Status: DC

## 2016-07-22 MED ORDER — AMBULATORY NON FORMULARY MEDICATION: 0 refills | Status: DC

## 2016-07-22 NOTE — Progress Notes (Signed)
I agree with the above note, plan 

## 2016-07-22 NOTE — Patient Instructions (Addendum)
If you are age 70 or older, your body mass index should be between 23-30. Your Body mass index is 31.37 kg/m. If this is out of the aforementioned range listed, please consider follow up with your Primary Care Provider.  If you are age 52 or younger, your body mass index should be between 19-25. Your Body mass index is 31.37 kg/m. If this is out of the aformentioned range listed, please consider follow up with your Primary Care Provider.   We have sent the following medications to your pharmacy for you to pick up at your convenience: Nexium 20 mg  Continue Ranitidine at bedtime. Continue FDgard 2 tabs daily.  Samples given. Continue Hydrocortisone cream inserted rectally twice daily x 1 week.  Follow up appt 08/05/16 at 1115 pm.  Thank you for choosing me and [facility].  Hyacinth Meeker, PA-C

## 2016-07-22 NOTE — Progress Notes (Signed)
Chief Complaint: Hemorrhoids, IBS, gas/bloating  Review of pertinent gastrointestinal problems: 1. Routine risk for colon cancer:  Colonsocopy 10/2011 Dr. Jarold Motto, no polyps, he recommended repeat screening colonoscopy in 10 years 2. Extensive left sided diverticulosis, noted colonoscopy 2013; Acute sigmoid diverticulitis (confirmed on CT scan) 05/2015, also WBC 13K 3. Large HH. EGD 10/2011 Dr. Jarold Motto 4. Chronic intermittent LUQ discomfort, bulging sensation: eventual CT scan 12/2014 showed no findings to account for the bulge, symptoms.   HPI:   Angela [name] is a 71 year old African-American female with a past medical history as below, who returns to clinic today for follow-up of her abdominal pain, bloating and IBS as well as a new complaint of hemorrhoids.   Please recall patient is regularly followed by Dr. Christella Hartigan and was last seen 12/03/15 for these complaints. She was followed in office by me on 07/01/16 and continued to describe bloating and fullness as well as a "constant rumbling" all of her her abdomen and a left upper quadrant "bulge". At that time she was continued on her Nexium 20 mg every morning and restarted on Zantac 150 mg at night. She was also started on FD guard 2 tabs twice a day. She was provided samples.   Today, the patient reports that she did take the FD guard 2 tabs twice a day for 3 days and did feel a lot better, but then the next morning when she took this before eating breakfast she vomited it up and so she stopped them. The patient had another episode of vomiting yesterday after eating soup for lunch. She tells me that this came back up about 15 minutes later. She has continued her Nexium 20 mg in the morning and Zantac at night. She does feel like the Zantac has helped with her reflux. She would like to try taking the FD guard after eating as she feels this may be beneficial. The patient asked again that what types of food she should avoid and asked specifically about  greasy fatty foods and deserts, if they would give her a problem.   Patient also discussed today that she has had a few episodes of incontinence. She believes it is related to inflammation of her hemorrhoids. Patient does express that she has been referred to surgery in the past for external hemorrhoids which occasionally bleed. She was told to return to their clinic when she had further bleeding as they could not tell which hemorrhoid was the problem area and she has not been able to do that but has had a couple instances of fecal incontinence where she passes gas and also passed some stool. She tells me her cholestyramine continues to help with her diarrhea and the stools are not loose.   Patient denies fever, chills, change in bowel habits, weight loss, anorexia or symptoms that awaken her at night.  Past Medical History:  Diagnosis Date  . Acne rosacea   . Anxiety   . Bile salt-induced diarrhea   . Colon polyps   . Degenerative disc disease   . Diverticulitis 05/2015  . Diverticulosis 05/10/2004  . Duodenitis 04/25/2005  . Esophageal stricture 09/13/2007  . Esophagitis 04/25/2005  . External hemorrhoids 05/10/2004  . 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 Fibromyalgia   . Gastroparesis   . GERD (gastroesophageal reflux disease) 09/13/2007  . Glaucoma   . Hiatal hernia 04/25/2005  . Hyperlipidemia   . Hypertension   . Hypothyroidism   . IBS (irritable bowel syndrome)   .  Migraine   . Osteoarthritis   . Other and unspecified noninfectious gastroenteritis and colitis(558.9)   . Sessile colonic polyp 05/30/2009  . Vertigo   . Vitamin D deficiency     Past Surgical History:  Procedure Laterality Date  . BUNIONECTOMY  09/04/2011   left  . CESAREAN SECTION    . CHOLECYSTECTOMY  1985  . COLONOSCOPY    . FOOT OSTEOTOMY  2006   Right foot  . TUBAL LIGATION  1983  . VAGINAL HYSTERECTOMY  1993    Current Outpatient Prescriptions  Medication Sig Dispense  Refill  . aspirin 81 MG tablet Take 81 mg by mouth daily.      . celecoxib (CELEBREX) 200 MG capsule TAKE 1 CAPSULE BY MOUTH EVERY DAY 30 capsule 3  . cholestyramine (QUESTRAN) 4 g packet TAKE 1 PACKET (4 G TOTAL) BY MOUTH DAILY. 30 packet 6  . clonazePAM (KLONOPIN) 0.5 MG tablet 1 po bid as needed 20 tablet 0  . ketoconazole (NIZORAL) 2 % cream Apply 1 application topically daily as needed for irritation.    Marland Kitchen latanoprost (XALATAN) 0.005 % ophthalmic solution Place 1 drop into both eyes at bedtime.    Marland Kitchen levothyroxine (SYNTHROID, LEVOTHROID) 50 MCG tablet TAKE 1/2 TABLET BY MOUTH DAILY 30 tablet 5  . magnesium oxide (MAG-OX) 400 MG tablet Take 400 mg by mouth every morning.    . metoprolol (LOPRESSOR) 50 MG tablet Take 1 tablet (50 mg total) by mouth 2 (two) times daily. 60 tablet 11  . metroNIDAZOLE (METROCREAM) 0.75 % cream Apply 1 application topically daily.    . Probiotic Product (PROBIOTIC DAILY PO) Take 1 capsule by mouth daily.    . ranitidine (ZANTAC) 150 MG tablet Take 1 tablet (150 mg total) by mouth at bedtime. 90 tablet 3  . simethicone (MYLICON) 125 MG chewable tablet Chew 125 mg by mouth every 6 (six) hours as needed for flatulence. Reported on 05/31/2015    . traMADol (ULTRAM) 50 MG tablet Take 1 tablet (50 mg total) by mouth 3 (three) times daily as needed (arthritis pain). 90 tablet 1  . triamcinolone cream (KENALOG) 0.1 % Apply 1 application topically 2 (two) times daily.    Marland Kitchen venlafaxine (EFFEXOR) 75 MG tablet Take 75 mg by mouth 1 day or 1 dose.    . Vitamin D, Ergocalciferol, (DRISDOL) 50000 units CAPS capsule TAKE 1 CAPSULE (50,000 UNITS TOTAL) BY MOUTH ONCE A WEEK. 12 capsule 1  . AMBULATORY NON FORMULARY MEDICATION Medication Name: FDgard samples given #32 lot number [id number]  Exp. 12/2016  gm 32 capsule 0  . esomeprazole (NEXIUM) 20 MG capsule Take 2 capsules 30-60 minutes before breakfast daily. 60 capsule 3   No current facility-administered medications for this visit.       Allergies as of 07/22/2016 - Review Complete 07/22/2016  Allergen Reaction Noted  . Tetracycline Other (See Comments)   . Codeine Nausea And Vomiting   . Paroxetine Rash 03/27/2015    Family History  Problem Relation Age of Onset  . Mitral valve prolapse Mother   . Hypertension Father   . Colon polyps Father   . Stroke Father   . Hypertension Sister     x2  . Diabetes Sister   . Colon cancer Maternal Aunt   . Stomach cancer Maternal Aunt   . Colon polyps Sister     Social History   Social History  . Marital status: Married    Spouse name: N/A  . Number of children:  2  . Years of education: N/A   Occupational History  . Retired    Social History Main Topics  . Smoking status: Never Smoker  . Smokeless tobacco: Never Used  . Alcohol use No  . Drug use: No  . Sexual activity: Yes   Other Topics Concern  . Not on file   Social History Narrative  . No narrative on file    Review of Systems:    Constitutional: No weight loss, fever or chills Cardiovascular: No chest pain Respiratory: No SOB  Gastrointestinal: See HPI and otherwise negative   Physical Exam:  Vital signs: BP 106/70   Pulse 72   Ht  (1.549 m)   Wt 166 lb (75.3 kg)   BMI 31.37 kg/m   Constitutional:   Pleasant African American female appears to be in NAD, Well developed, Well nourished, alert and cooperative Respiratory: Respirations even and unlabored. Lungs clear to auscultation bilaterally.   No wheezes, crackles, or rhonchi.  Cardiovascular: Normal S1, S2. No MRG. Regular rate and rhythm. No peripheral edema, cyanosis or pallor.  Gastrointestinal:  Soft, mild distension, nontender. No rebound or guarding. Normal bowel sounds. No appreciable masses or hepatomegaly. Psychiatric: Demonstrates good judgement and reason without abnormal affect or behaviors.  RECENT LABS: CBC    Component Value Date/Time   WBC 6.1 06/27/2016 1101   RBC 4.07 06/27/2016 1101   HGB 12.9 06/27/2016  1101   HCT 37.5 06/27/2016 1101   PLT 341.0 06/27/2016 1101   MCV 92.1 06/27/2016 1101   MCH 30.4 05/21/2015 0518   MCHC 34.5 06/27/2016 1101   RDW 13.7 06/27/2016 1101   LYMPHSABS 2.7 05/16/2015 1210   MONOABS 1.8 (H) 05/16/2015 1210   EOSABS 0.3 05/16/2015 1210   BASOSABS 0.0 05/16/2015 1210    CMP     Component Value Date/Time   NA 136 06/27/2016 1101   K 3.8 06/27/2016 1101   CL 102 06/27/2016 1101   CO2 26 06/27/2016 1101   GLUCOSE 116 (H) 06/27/2016 1101   BUN 17 06/27/2016 1101   CREATININE 0.80 06/27/2016 1101   CALCIUM 9.6 06/27/2016 1101   PROT 7.1 06/27/2016 1101   ALBUMIN 4.1 06/27/2016 1101   AST 19 06/27/2016 1101   ALT 18 06/27/2016 1101   ALKPHOS 86 06/27/2016 1101   BILITOT 0.3 06/27/2016 1101   GFRNONAA >60 05/21/2015 0518   GFRAA >60 05/21/2015 0518    Assessment: 1. Bloating: Again some of this is likely related to IBS and functional dyspepsia as well as large hiatal hernia, patient did see some relief with FD guard when she was able to take this 2. GERD: Patient has relief of most of her symptoms, though it has continued with 2 episodes of emesis since being seen last, Zantac seems to help, will increase Nexium 3. Internal hemorrhoids: With an episode of fecal incontinence recently with gas expulsion, likely due to decreased sphincter tone and inflammation of internal hemorrhoids 4. IBS: Continues for the patient with symptoms as above, bowel habits seem regulated with cholestyramine  Plan: 1. Would recommend patient retry FD Guard 2 tabs, twice a day, she should try this after eating a meal as it seemed to give her nausea before, but was helping with her symptoms. We did provide her with further samples. 2. Continue Zantac 150 mg daily at bedtime 3. Increase Nexium to 40 mg every morning, 30-60 minutes before eating breakfast, patient will call if she needs refills of this medicine and if it  is working 4. Discussed hemorrhoids, recommend that she use  her hydrocortisone ointment applied rectally twice a day 1-2 weeks due to inflammation recently. She should continue to follow with surgical team regarding external hemorrhoids and bleeding 5. Discussed left upper quadrant discomfort at times, likely this is related to gas buildup/splenic flexure syndrome. Discussed this. Also discussed decreasing gas producing foods in her diet and reviewed anti-reflux diet. 6. Continue cholestyramine as this is helping with diarrhea 7. Patient to follow in clinic with me or Dr. Christella Hartigan in 2-3 months  Hyacinth Meeker, PA-C [facility] 07/22/2016, 11:57 AM  Cc: Myrlene Broker, *

## 2016-08-05 ENCOUNTER — Encounter: Payer: Self-pay | Admitting: Physician Assistant

## 2016-08-05 ENCOUNTER — Ambulatory Visit (INDEPENDENT_AMBULATORY_CARE_PROVIDER_SITE_OTHER): Payer: Medicare Other | Admitting: Physician Assistant

## 2016-08-05 VITALS — BP 118/74 | HR 66 | Ht 61.0 in | Wt 169.0 lb

## 2016-08-05 DIAGNOSIS — K648 Other hemorrhoids: Secondary | ICD-10-CM

## 2016-08-05 DIAGNOSIS — K219 Gastro-esophageal reflux disease without esophagitis: Secondary | ICD-10-CM

## 2016-08-05 DIAGNOSIS — R140 Abdominal distension (gaseous): Secondary | ICD-10-CM

## 2016-08-05 NOTE — Progress Notes (Signed)
Chief Complaint: Hemorrhoids, IBS, Gas/bloating  Review of pertinent gastrointestinal problems: 1. Routine risk for colon cancer:  Colonsocopy 10/2011 Dr. Jarold Motto, no polyps, he recommended repeat screening colonoscopy in 10 years 2. Extensive left sided diverticulosis, noted colonoscopy 2013; Acute sigmoid diverticulitis (confirmed on CT scan) 05/2015, also WBC 13K 3. Large HH. EGD 10/2011 Dr. Jarold Motto 4. Chronic intermittent LUQ discomfort, bulging sensation: eventual CT scan 12/2014 showed no findings to account for the bulge, symptoms.   HPI:  Angela [name] is a 71 year old African-American female with a past medical history as below, who returns to clinic today for follow-up of her abdominal pain, bloating and IBS as well as hemorrhoids.     Please recall patient regularly follows with Dr. Christella Hartigan and was last seen 12/03/15 for these complaints. She has followed in office with me on 07/01/16 and again on 07/22/16 for continued complaints. At her last office visit she had reported taking FD guard 2 tablets twice a day for 3 days and feeling a lot better, she had 1 episode of vomiting and felt like her reflux was under control with Nexium 20 mg in the morning and Zantac at night. Patient did discuss hemorrhoid flare as well as some fecal incontinence. At that time it was recommended she continue FD Guard 2 tabs twice a day and start after eating a meal as it seemed to give her nausea before. Continue Zantac 150 at bedtime and Nexium was increased to 40 mg every morning. Also discussed using hydrocortisone ointment applied rectally twice a day for one to 2 weeks. We did discuss left upper quadrant discomfort which seem to be related to gas buildup. She was to continue cholestyramine for her diarrhea.   Today, the patient tells me that she has been using her FD guard 2 tabs twice a day over the past 2 weeks and has had almost complete relief of all of her bloating and other abdominal complaints. She no  longer has left upper quadrant pain and all of her "rumbling" is a lot better. The patient has been using this after meals and is no longer having nausea with this medicine. She is very impressed with how much better she feels.   Patient has continued her Nexium 40 mg every morning and Zantac 150 mg every afternoon and is feeling well. No further reflux symptoms   She has been using Hydrocortisone ointment at night before bedtime and feels as though this is helping her hemorrhoids. She has had no further instances of fecal incontinence.   Patient denies fever, chills, blood in her stool, melena, weight loss, fatigue, anorexia, nausea, vomiting, heartburn, reflux or symptoms that awaken her at night.  Past Medical History:  Diagnosis Date  . Acne rosacea   . Anxiety   . Bile salt-induced diarrhea   . Colon polyps   . Degenerative disc disease   . Diverticulitis 05/2015  . Diverticulosis 05/10/2004  . Duodenitis 04/25/2005  . Esophageal stricture 09/13/2007  . Esophagitis 04/25/2005  . External hemorrhoids 05/10/2004  . 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 Fibromyalgia   . Gastroparesis   . GERD (gastroesophageal reflux disease) 09/13/2007  . Glaucoma   . Hiatal hernia 04/25/2005  . Hyperlipidemia   . Hypertension   . Hypothyroidism   . IBS (irritable bowel syndrome)   . Migraine   . Osteoarthritis   . Other and unspecified noninfectious gastroenteritis and colitis(558.9)   . Sessile colonic polyp 05/30/2009  . Vertigo   .  Vitamin D deficiency     Past Surgical History:  Procedure Laterality Date  . BUNIONECTOMY  09/04/2011   left  . CESAREAN SECTION    . CHOLECYSTECTOMY  1985  . COLONOSCOPY    . FOOT OSTEOTOMY  2006   Right foot  . TUBAL LIGATION  1983  . VAGINAL HYSTERECTOMY  1993    Current Outpatient Prescriptions  Medication Sig Dispense Refill  . AMBULATORY NON FORMULARY MEDICATION Medication Name: FDgard samples given #32 lot number [id number]   Exp. 12/2016  gm 32 capsule 0  . aspirin 81 MG tablet Take 81 mg by mouth daily.      . celecoxib (CELEBREX) 200 MG capsule TAKE 1 CAPSULE BY MOUTH EVERY DAY 30 capsule 3  . cholestyramine (QUESTRAN) 4 g packet TAKE 1 PACKET (4 G TOTAL) BY MOUTH DAILY. 30 packet 6  . clonazePAM (KLONOPIN) 0.5 MG tablet 1 po bid as needed 20 tablet 0  . esomeprazole (NEXIUM) 20 MG capsule Take 2 capsules 30-60 minutes before breakfast daily. 60 capsule 3  . ketoconazole (NIZORAL) 2 % cream Apply 1 application topically daily as needed for irritation.    Marland Kitchen latanoprost (XALATAN) 0.005 % ophthalmic solution Place 1 drop into both eyes at bedtime.    Marland Kitchen levothyroxine (SYNTHROID, LEVOTHROID) 50 MCG tablet TAKE 1/2 TABLET BY MOUTH DAILY 30 tablet 5  . magnesium oxide (MAG-OX) 400 MG tablet Take 400 mg by mouth every morning.    . metoprolol (LOPRESSOR) 50 MG tablet Take 1 tablet (50 mg total) by mouth 2 (two) times daily. 60 tablet 11  . metroNIDAZOLE (METROCREAM) 0.75 % cream Apply 1 application topically daily.    . Probiotic Product (PROBIOTIC DAILY PO) Take 1 capsule by mouth daily.    . ranitidine (ZANTAC) 150 MG tablet Take 1 tablet (150 mg total) by mouth at bedtime. 90 tablet 3  . simethicone (MYLICON) 125 MG chewable tablet Chew 125 mg by mouth every 6 (six) hours as needed for flatulence. Reported on 05/31/2015    . traMADol (ULTRAM) 50 MG tablet Take 1 tablet (50 mg total) by mouth 3 (three) times daily as needed (arthritis pain). 90 tablet 1  . triamcinolone cream (KENALOG) 0.1 % Apply 1 application topically 2 (two) times daily.    Marland Kitchen venlafaxine (EFFEXOR) 75 MG tablet Take 75 mg by mouth 1 day or 1 dose.    . Vitamin D, Ergocalciferol, (DRISDOL) 50000 units CAPS capsule TAKE 1 CAPSULE (50,000 UNITS TOTAL) BY MOUTH ONCE A WEEK. 12 capsule 1   No current facility-administered medications for this visit.     Allergies as of 08/05/2016 - Review Complete 08/05/2016  Allergen Reaction Noted  . Tetracycline  Other (See Comments)   . Codeine Nausea And Vomiting   . Paroxetine Rash 03/27/2015    Family History  Problem Relation Age of Onset  . Mitral valve prolapse Mother   . Hypertension Father   . Colon polyps Father   . Stroke Father   . Hypertension Sister     x2  . Diabetes Sister   . Colon cancer Maternal Aunt   . Stomach cancer Maternal Aunt   . Colon polyps Sister     Social History   Social History  . Marital status: Married    Spouse name: N/A  . Number of children: 2  . Years of education: N/A   Occupational History  . Retired    Social History Main Topics  . Smoking status: Never Smoker  .  Smokeless tobacco: Never Used  . Alcohol use No  . Drug use: No  . Sexual activity: Yes   Other Topics Concern  . Not on file   Social History Narrative  . No narrative on file    Review of Systems:    Constitutional: No weight loss, fever or chills Cardiovascular: No chest pain Respiratory: No SOB Gastrointestinal: See HPI and otherwise negative   Physical Exam:  Vital signs: BP 118/74   Pulse 66   Ht  (1.549 m)   Wt 169 lb (76.7 kg)   SpO2 99%   BMI 31.93 kg/m   Constitutional:   Pleasant African American female appears to be in NAD, Well developed, Well nourished, alert and cooperative Respiratory: Respirations even and unlabored. Lungs clear to auscultation bilaterally.   No wheezes, crackles, or rhonchi.  Cardiovascular: Normal S1, S2. No MRG. Regular rate and rhythm. No peripheral edema, cyanosis or pallor.  Gastrointestinal:  Soft, nondistended, nontender. No rebound or guarding. Normal bowel sounds. No appreciable masses or hepatomegaly. Psychiatric: Demonstrates good judgement and reason without abnormal affect or behaviors.  RELEVANT LABS AND IMAGING: CBC    Component Value Date/Time   WBC 6.1 06/27/2016 1101   RBC 4.07 06/27/2016 1101   HGB 12.9 06/27/2016 1101   HCT 37.5 06/27/2016 1101   PLT 341.0 06/27/2016 1101   MCV 92.1  06/27/2016 1101   MCH 30.4 05/21/2015 0518   MCHC 34.5 06/27/2016 1101   RDW 13.7 06/27/2016 1101   LYMPHSABS 2.7 05/16/2015 1210   MONOABS 1.8 (H) 05/16/2015 1210   EOSABS 0.3 05/16/2015 1210   BASOSABS 0.0 05/16/2015 1210    CMP     Component Value Date/Time   NA 136 06/27/2016 1101   K 3.8 06/27/2016 1101   CL 102 06/27/2016 1101   CO2 26 06/27/2016 1101   GLUCOSE 116 (H) 06/27/2016 1101   BUN 17 06/27/2016 1101   CREATININE 0.80 06/27/2016 1101   CALCIUM 9.6 06/27/2016 1101   PROT 7.1 06/27/2016 1101   ALBUMIN 4.1 06/27/2016 1101   AST 19 06/27/2016 1101   ALT 18 06/27/2016 1101   ALKPHOS 86 06/27/2016 1101   BILITOT 0.3 06/27/2016 1101   GFRNONAA >60 05/21/2015 0518   GFRAA >60 05/21/2015 0518    Assessment: 1. Bloating/IBS: Better with FD Guard 2 tabs twice a day 2. GERD: Better with Nexium 40 mg every morning and Zantac 150 mg every night 3. Internal hemorrhoids: Better with hydrocortisone ointment applied rectally over the past 2 weeks once daily 4. Left upper quadrant abdominal pain: Better with a FD Guard   Plan: 1. Patient to continue on her current regimen as prescribed  2. Discussed that the patient should continue to take her FD Guard 2 tabs twice a day for a total of a month and then try to decrease to 2 tabs once daily or one tablet twice daily, did provide her with further samples and coupons today 3. Patient to follow in clinic as needed with Dr. Christella Hartigan or myself in the future  Hyacinth Meeker, PA-C  [facility] 08/05/2016, 11:47 AM  Cc: Myrlene Broker, *

## 2016-08-05 NOTE — Progress Notes (Signed)
I agree with the above note, plan 

## 2016-08-08 ENCOUNTER — Other Ambulatory Visit: Payer: Self-pay | Admitting: Cardiology

## 2016-08-08 NOTE — Telephone Encounter (Signed)
REFILL 

## 2016-08-09 ENCOUNTER — Other Ambulatory Visit: Payer: Self-pay | Admitting: Internal Medicine

## 2016-09-04 ENCOUNTER — Other Ambulatory Visit: Payer: Self-pay | Admitting: Cardiology

## 2016-10-18 ENCOUNTER — Other Ambulatory Visit: Payer: Self-pay | Admitting: Internal Medicine

## 2016-11-01 ENCOUNTER — Other Ambulatory Visit: Payer: Self-pay | Admitting: Cardiology

## 2016-11-03 NOTE — Telephone Encounter (Signed)
REFILL 

## 2016-11-09 ENCOUNTER — Encounter (HOSPITAL_BASED_OUTPATIENT_CLINIC_OR_DEPARTMENT_OTHER): Payer: Self-pay | Admitting: *Deleted

## 2016-11-09 ENCOUNTER — Emergency Department (HOSPITAL_BASED_OUTPATIENT_CLINIC_OR_DEPARTMENT_OTHER): Payer: Medicare Other

## 2016-11-09 ENCOUNTER — Observation Stay (HOSPITAL_BASED_OUTPATIENT_CLINIC_OR_DEPARTMENT_OTHER)
Admission: EM | Admit: 2016-11-09 | Discharge: 2016-11-11 | Disposition: A | Discharge status: Home / Self Care | Payer: Medicare Other | Attending: Internal Medicine | Admitting: Internal Medicine

## 2016-11-09 DIAGNOSIS — M797 Fibromyalgia: Secondary | ICD-10-CM | POA: Insufficient documentation

## 2016-11-09 DIAGNOSIS — Z833 Family history of diabetes mellitus: Secondary | ICD-10-CM | POA: Insufficient documentation

## 2016-11-09 DIAGNOSIS — M1990 Unspecified osteoarthritis, unspecified site: Secondary | ICD-10-CM | POA: Diagnosis not present

## 2016-11-09 DIAGNOSIS — I10 Essential (primary) hypertension: Secondary | ICD-10-CM | POA: Diagnosis not present

## 2016-11-09 DIAGNOSIS — H409 Unspecified glaucoma: Secondary | ICD-10-CM | POA: Insufficient documentation

## 2016-11-09 DIAGNOSIS — K449 Diaphragmatic hernia without obstruction or gangrene: Secondary | ICD-10-CM | POA: Diagnosis not present

## 2016-11-09 DIAGNOSIS — R938 Abnormal findings on diagnostic imaging of other specified body structures: Secondary | ICD-10-CM | POA: Diagnosis present

## 2016-11-09 DIAGNOSIS — R42 Dizziness and giddiness: Secondary | ICD-10-CM

## 2016-11-09 DIAGNOSIS — E876 Hypokalemia: Secondary | ICD-10-CM | POA: Diagnosis not present

## 2016-11-09 DIAGNOSIS — L719 Rosacea, unspecified: Secondary | ICD-10-CM | POA: Insufficient documentation

## 2016-11-09 DIAGNOSIS — Z7982 Long term (current) use of aspirin: Secondary | ICD-10-CM | POA: Insufficient documentation

## 2016-11-09 DIAGNOSIS — R51 Headache: Secondary | ICD-10-CM

## 2016-11-09 DIAGNOSIS — K5790 Diverticulosis of intestine, part unspecified, without perforation or abscess without bleeding: Secondary | ICD-10-CM | POA: Insufficient documentation

## 2016-11-09 DIAGNOSIS — E785 Hyperlipidemia, unspecified: Secondary | ICD-10-CM | POA: Insufficient documentation

## 2016-11-09 DIAGNOSIS — L280 Lichen simplex chronicus: Secondary | ICD-10-CM | POA: Insufficient documentation

## 2016-11-09 DIAGNOSIS — Z888 Allergy status to other drugs, medicaments and biological substances status: Secondary | ICD-10-CM | POA: Insufficient documentation

## 2016-11-09 DIAGNOSIS — R9389 Abnormal findings on diagnostic imaging of other specified body structures: Secondary | ICD-10-CM

## 2016-11-09 DIAGNOSIS — R930 Abnormal findings on diagnostic imaging of skull and head, not elsewhere classified: Secondary | ICD-10-CM | POA: Diagnosis present

## 2016-11-09 DIAGNOSIS — Z9049 Acquired absence of other specified parts of digestive tract: Secondary | ICD-10-CM | POA: Insufficient documentation

## 2016-11-09 DIAGNOSIS — Z8249 Family history of ischemic heart disease and other diseases of the circulatory system: Secondary | ICD-10-CM | POA: Insufficient documentation

## 2016-11-09 DIAGNOSIS — F419 Anxiety disorder, unspecified: Secondary | ICD-10-CM | POA: Diagnosis not present

## 2016-11-09 DIAGNOSIS — E559 Vitamin D deficiency, unspecified: Secondary | ICD-10-CM | POA: Insufficient documentation

## 2016-11-09 DIAGNOSIS — Z885 Allergy status to narcotic agent status: Secondary | ICD-10-CM | POA: Insufficient documentation

## 2016-11-09 DIAGNOSIS — Z8371 Family history of colonic polyps: Secondary | ICD-10-CM | POA: Diagnosis not present

## 2016-11-09 DIAGNOSIS — Z86010 Personal history of colonic polyps: Secondary | ICD-10-CM | POA: Insufficient documentation

## 2016-11-09 DIAGNOSIS — J309 Allergic rhinitis, unspecified: Secondary | ICD-10-CM | POA: Insufficient documentation

## 2016-11-09 DIAGNOSIS — K580 Irritable bowel syndrome with diarrhea: Secondary | ICD-10-CM | POA: Diagnosis not present

## 2016-11-09 DIAGNOSIS — G43809 Other migraine, not intractable, without status migrainosus: Secondary | ICD-10-CM | POA: Diagnosis not present

## 2016-11-09 DIAGNOSIS — K3184 Gastroparesis: Secondary | ICD-10-CM | POA: Insufficient documentation

## 2016-11-09 DIAGNOSIS — E039 Hypothyroidism, unspecified: Secondary | ICD-10-CM | POA: Diagnosis present

## 2016-11-09 DIAGNOSIS — Z823 Family history of stroke: Secondary | ICD-10-CM | POA: Insufficient documentation

## 2016-11-09 DIAGNOSIS — Z800 Family history of malignant neoplasm of digestive organs: Secondary | ICD-10-CM | POA: Insufficient documentation

## 2016-11-09 DIAGNOSIS — Z90710 Acquired absence of both cervix and uterus: Secondary | ICD-10-CM | POA: Diagnosis not present

## 2016-11-09 DIAGNOSIS — Z881 Allergy status to other antibiotic agents status: Secondary | ICD-10-CM | POA: Insufficient documentation

## 2016-11-09 DIAGNOSIS — Z79899 Other long term (current) drug therapy: Secondary | ICD-10-CM | POA: Insufficient documentation

## 2016-11-09 DIAGNOSIS — R519 Headache, unspecified: Secondary | ICD-10-CM

## 2016-11-09 DIAGNOSIS — K219 Gastro-esophageal reflux disease without esophagitis: Secondary | ICD-10-CM | POA: Diagnosis present

## 2016-11-09 LAB — COMPREHENSIVE METABOLIC PANEL
ALT: 29 U/L (ref 14–54)
AST: 30 U/L (ref 15–41)
Albumin: 4.4 g/dL (ref 3.5–5.0)
Alkaline Phosphatase: 80 U/L (ref 38–126)
Anion gap: 10 (ref 5–15)
BILIRUBIN TOTAL: 0.7 mg/dL (ref 0.3–1.2)
BUN: 12 mg/dL (ref 6–20)
CHLORIDE: 102 mmol/L (ref 101–111)
CO2: 28 mmol/L (ref 22–32)
Calcium: 9.6 mg/dL (ref 8.9–10.3)
Creatinine, Ser: 0.99 mg/dL (ref 0.44–1.00)
GFR, EST NON AFRICAN AMERICAN: 56 mL/min — AB (ref >60)
Glucose, Bld: 111 mg/dL — ABNORMAL HIGH (ref 65–99)
POTASSIUM: 3.4 mmol/L — AB (ref 3.5–5.1)
SODIUM: 140 mmol/L (ref 135–145)
TOTAL PROTEIN: 7.9 g/dL (ref 6.5–8.1)

## 2016-11-09 LAB — CBC WITH DIFFERENTIAL/PLATELET
BASOS ABS: 0 10*3/uL (ref 0.0–0.1)
BASOS PCT: 0 %
EOS PCT: 2 %
Eosinophils Absolute: 0.1 10*3/uL (ref 0.0–0.7)
HCT: 40.1 % (ref 36.0–46.0)
Hemoglobin: 14 g/dL (ref 12.0–15.0)
LYMPHS PCT: 42 %
Lymphs Abs: 2.6 10*3/uL (ref 0.7–4.0)
MCH: 31 pg (ref 26.0–34.0)
MCHC: 34.9 g/dL (ref 30.0–36.0)
MCV: 88.7 fL (ref 78.0–100.0)
MONO ABS: 0.6 10*3/uL (ref 0.1–1.0)
Monocytes Relative: 10 %
NEUTROS ABS: 2.9 10*3/uL (ref 1.7–7.7)
Neutrophils Relative %: 46 %
PLATELETS: 344 10*3/uL (ref 150–400)
RBC: 4.52 MIL/uL (ref 3.87–5.11)
RDW: 14 % (ref 11.5–15.5)
WBC: 6.3 10*3/uL (ref 4.0–10.5)

## 2016-11-09 LAB — URINALYSIS, COMPLETE (UACMP) WITH MICROSCOPIC
BILIRUBIN URINE: NEGATIVE
GLUCOSE, UA: NEGATIVE mg/dL
KETONES UR: NEGATIVE mg/dL
LEUKOCYTES UA: NEGATIVE
NITRITE: NEGATIVE
PROTEIN: NEGATIVE mg/dL
Specific Gravity, Urine: 1.02 (ref 1.005–1.030)
pH: 5.5 (ref 5.0–8.0)

## 2016-11-09 LAB — LIPASE, BLOOD: LIPASE: 35 U/L (ref 11–51)

## 2016-11-09 MED ORDER — SODIUM CHLORIDE 0.9 % IV BOLUS (SEPSIS)
1000.0000 mL | Freq: Once | INTRAVENOUS | Status: AC
  Administered 2016-11-09: 1000 mL via INTRAVENOUS

## 2016-11-09 MED ORDER — ONDANSETRON HCL 4 MG/2ML IJ SOLN
4.0000 mg | Freq: Once | INTRAMUSCULAR | Status: AC
  Administered 2016-11-09: 4 mg via INTRAVENOUS
  Filled 2016-11-09: qty 2

## 2016-11-09 MED ORDER — SODIUM CHLORIDE 0.9 % IV SOLN
INTRAVENOUS | Status: DC
  Administered 2016-11-09: 20:00:00 via INTRAVENOUS

## 2016-11-09 NOTE — ED Notes (Signed)
Throbbing to base of her and ringing to her ears.  She also claimed of being dizzy.

## 2016-11-09 NOTE — Plan of Care (Signed)
71 yo F with dizziness and nausea for past 2 days.  CT head shows hypo attenuation of B occipital lobes of unclear significance.  Patient going to tele obs for MRI and neuro eval.

## 2016-11-09 NOTE — ED Triage Notes (Signed)
Pt presents with nausea and dizziness since this past Friday. Denies known injury. Reports it came on gradually (also reports intermittent headache). Denies LOC. Reports vomiting x1/day. Denies vision changes. Face symmetrical; ambulates without difficulty.

## 2016-11-09 NOTE — ED Notes (Signed)
Up to b/r, steady gait 

## 2016-11-09 NOTE — ED Notes (Signed)
[facility] here, no changes 

## 2016-11-09 NOTE — ED Notes (Signed)
Alert, NAD, calm, interactive, resps e/u, speaking in clear complete sentences, no dyspnea noted, skin W&D, VSS, reports mild HA/occiput (improved), "feel better after IVF", some mild upper abd discomfort remains, (denies: pain, sob, nausea, dizziness or visual changes). [facility]

## 2016-11-09 NOTE — ED Notes (Signed)
ED Provider at bedside. 

## 2016-11-09 NOTE — ED Provider Notes (Signed)
[facility] Provider Note   CSN: [id number] Arrival date & time: 11/09/16  1758  By signing my name below, I, Ny'Kea Lewis, attest that this documentation has been prepared under the direction and in the presence of Linwood DibblesKnapp, Aeon Kessner, MD. Electronically Signed: Karren CobbleNy'Kea Lewis, ED Scribe. 11/09/16. 8:57 PM.   History   Chief Complaint Chief Complaint  Patient presents with  . Dizziness    The history is provided by the patient. No language interpreter was used.  Dizziness  Associated symptoms: headaches, nausea and vomiting   Associated symptoms: no weakness     HPI HPI Comments: Angela Dingwallauline T Snipe is a 71 y.o. female with a PMHx of HTN, HLD, hypothyroidism, and vertigo, who presents to the Emergency Department complaining of gradual onset of dizziness and nausea that began two days ago. She notes associated intermittent throbbing to the posterior head, pressure to her ears and nasal cavity , and ear pain and ringing. Pt reports two days ago she began to experience dizziness.   Today she had several episodes of nausea and vomiting and his unable to keep food down.   No fevers.  No abdominal pain.   Denies the feeling of remove spinning similar to her vertigo history. PSHx of hysterectomy and cholecystectomy.     Past Medical History:  Diagnosis Date  . Acne rosacea   . Anxiety   . Bile salt-induced diarrhea   . Colon polyps   . Degenerative disc disease   . Diverticulitis 05/2015  . Diverticulosis 05/10/2004  . Duodenitis 04/25/2005  . Esophageal stricture 09/13/2007  . Esophagitis 04/25/2005  . External hemorrhoids 05/10/2004  . 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 Fibromyalgia   . Gastroparesis   . GERD (gastroesophageal reflux disease) 09/13/2007  . Glaucoma   . Hiatal hernia 04/25/2005  . Hyperlipidemia   . Hypertension   . Hypothyroidism   . IBS (irritable bowel syndrome)   . Migraine   . Osteoarthritis   . Other and unspecified  noninfectious gastroenteritis and colitis(558.9)   . Sessile colonic polyp 05/30/2009  . Vertigo   . Vitamin D deficiency    Patient Active Problem List   Diagnosis Date Noted  . Fatigue 06/27/2016  . Acute upper respiratory infection 02/07/2016  . Allergic rhinitis 02/07/2016  . Throat ulcer 09/05/2015  . Lichen of skin 09/05/2015  . Diverticulitis 05/16/2015  . Diarrhea 02/13/2015  . Hemorrhoid 02/13/2015  . Numbness and tingling in hands 05/15/2014  . Fibromuscular hyperplasia of artery (HCC)   . Migraine headache 09/24/2011  . Bile salt-induced diarrhea 11/14/2010  . GERD (gastroesophageal reflux disease) 08/16/2010  . HYPERLIPIDEMIA 12/20/2009  . Hypothyroidism 07/20/2009  . VITAMIN D DEFICIENCY 07/20/2009  . Essential hypertension 05/14/2007  . ACNE ROSACEA 05/14/2007  . OSTEOARTHRITIS 05/14/2007  . CAROTID BRUIT 05/14/2007  . CHEST PAIN, ATYPICAL 05/14/2007  . Fibromyalgia 03/30/2007   Past Surgical History:  Procedure Laterality Date  . BUNIONECTOMY  09/04/2011   left  . CESAREAN SECTION    . CHOLECYSTECTOMY  1985  . COLONOSCOPY    . FOOT OSTEOTOMY  2006   Right foot  . TUBAL LIGATION  1983  . VAGINAL HYSTERECTOMY  1993   OB History    No data available     Home Medications    Prior to Admission medications   Medication Sig Start Date End Date Taking? Authorizing Provider  AMBULATORY NON FORMULARY MEDICATION Medication Name: FDgard samples given #32 lot number [id number]  Exp. 12/2016  gm 07/22/16  Yes Unk LightningLemmon, Jennifer Lynne, PA  aspirin 81 MG tablet Take 81 mg by mouth daily.     Yes [provider]  celecoxib (CELEBREX) 200 MG capsule TAKE 1 CAPSULE BY MOUTH EVERY DAY 08/11/16  Yes Myrlene Brokerrawford, Elizabeth A, MD  cholestyramine (QUESTRAN) 4 g packet TAKE 1 PACKET (4 G TOTAL) BY MOUTH DAILY. 12/20/15  Yes Rachael FeeJacobs, Daniel P, MD  clonazePAM Scarlette Calico(KLONOPIN) 0.5 MG tablet 1 po bid as needed 05/21/15  Yes Clydia LlanoElmahi, Mutaz, MD  esomeprazole (NEXIUM) 20 MG capsule Take 2  capsules 30-60 minutes before breakfast daily. 07/22/16  Yes Unk LightningLemmon, Jennifer Lynne, PA  ketoconazole (NIZORAL) 2 % cream Apply 1 application topically daily as needed for irritation.   Yes [provider]  latanoprost (XALATAN) 0.005 % ophthalmic solution Place 1 drop into both eyes at bedtime.   Yes [provider]  levothyroxine (SYNTHROID, LEVOTHROID) 50 MCG tablet TAKE 1/2 TABLET BY MOUTH DAILY 07/01/16  Yes Myrlene Brokerrawford, Elizabeth A, MD  magnesium oxide (MAG-OX) 400 MG tablet Take 400 mg by mouth every morning.   Yes [provider]  metoprolol tartrate (LOPRESSOR) 50 MG tablet Take 1 tablet (50 mg total) by mouth 2 (two) times daily. NEED OV. 11/03/16  Yes Lewayne Buntingrenshaw, Brian S, MD  metroNIDAZOLE (METROCREAM) 0.75 % cream Apply 1 application topically daily.   Yes [provider]  Probiotic Product (PROBIOTIC DAILY PO) Take 1 capsule by mouth daily.   Yes [provider]  ranitidine (ZANTAC) 150 MG tablet Take 1 tablet (150 mg total) by mouth at bedtime. 07/01/16  Yes Unk LightningLemmon, Jennifer Lynne, PA  simethicone (MYLICON) 125 MG chewable tablet Chew 125 mg by mouth every 6 (six) hours as needed for flatulence. Reported on 05/31/2015   Yes [provider]  traMADol (ULTRAM) 50 MG tablet Take 1 tablet (50 mg total) by mouth 3 (three) times daily as needed (arthritis pain). 06/30/13  Yes Michele McalpineNadel, Scott M, MD  triamcinolone cream (KENALOG) 0.1 % Apply 1 application topically 2 (two) times daily.   Yes [provider]  venlafaxine (EFFEXOR) 75 MG tablet Take 75 mg by mouth 1 day or 1 dose.   Yes [provider]  Vitamin D, Ergocalciferol, (DRISDOL) 50000 units CAPS capsule TAKE 1 CAPSULE (50,000 UNITS TOTAL) BY MOUTH ONCE A WEEK. 10/20/16  Yes Veryl Speakalone, Gregory D, FNP    Family History Family History  Problem Relation Age of Onset  . Mitral valve prolapse Mother   . Hypertension Father   . Colon polyps Father   . Stroke Father   . Hypertension  Sister        x2  . Diabetes Sister   . Colon cancer Maternal Aunt   . Stomach cancer Maternal Aunt   . Colon polyps Sister     Social History Social History  Substance Use Topics  . Smoking status: Never Smoker  . Smokeless tobacco: Never Used  . Alcohol use No     Allergies   Tetracycline; Codeine; and Paroxetine   Review of Systems Review of Systems  Gastrointestinal: Positive for nausea and vomiting.  Musculoskeletal: Negative for gait problem.  Neurological: Positive for dizziness, light-headedness and headaches. Negative for facial asymmetry, weakness and numbness.  All other systems reviewed and are negative.   Physical Exam Updated Vital Signs BP 135/69   Pulse 62   Temp 98.5 F (36.9 C)   Resp 16   Ht 1.549 m (5\' 1" )   Wt 75.3 kg (166  lb)   SpO2 100%   BMI 31.37 kg/m   Physical Exam  Constitutional: She is oriented to person, place, and time. She appears well-developed and well-nourished. No distress.  HENT:  Head: Normocephalic and atraumatic.  Right Ear: External ear normal.  Left Ear: External ear normal.  Mouth/Throat: Oropharynx is clear and moist.  Normal TMs.   Eyes: Conjunctivae are normal. Right eye exhibits no discharge. Left eye exhibits no discharge. No scleral icterus.  Neck: Neck supple. No tracheal deviation present.  Cardiovascular: Normal rate, regular rhythm and intact distal pulses.   Pulmonary/Chest: Effort normal and breath sounds normal. No stridor. No respiratory distress. She has no wheezes. She has no rales.  Abdominal: Soft. Bowel sounds are normal. She exhibits no distension and no mass. There is tenderness (mild epigastrium). There is no rebound and no guarding. No hernia.  Musculoskeletal: She exhibits no edema or tenderness.  Neurological: She is alert and oriented to person, place, and time. She has normal strength. No cranial nerve deficit (No facial droop, extraocular movements intact, tongue midline ) or sensory  deficit. She exhibits normal muscle tone. She displays no seizure activity. Coordination normal.  No pronator drift bilateral upper extrem, able to hold both legs off bed for 5 seconds, sensation intact in all extremities, no visual field cuts, no left or right sided neglect, normal finger-nose exam bilaterally, no nystagmus noted   Skin: Skin is warm and dry. No rash noted.  Psychiatric: She has a normal mood and affect.  Nursing note and vitals reviewed.   ED Treatments / Results  DIAGNOSTIC STUDIES: Oxygen Saturation is 100% on RA, normal by my interpretation.   COORDINATION OF CARE: 7:00 PM-Discussed next steps with pt. Pt verbalized understanding and is agreeable with the plan.   Labs (all labs ordered are listed, but only abnormal results are displayed) Labs Reviewed  COMPREHENSIVE METABOLIC PANEL - Abnormal; Notable for the following:       Result Value   Potassium 3.4 (*)    Glucose, Bld 111 (*)    GFR calc non Af Amer 56 (*)    All other components within normal limits  URINALYSIS, COMPLETE (UACMP) WITH MICROSCOPIC - Abnormal; Notable for the following:    APPearance CLOUDY (*)    Hgb urine dipstick SMALL (*)    Squamous Epithelial / LPF 0-5 (*)    Bacteria, UA FEW (*)    All other components within normal limits  LIPASE, BLOOD  CBC WITH DIFFERENTIAL/PLATELET    EKG  EKG Interpretation  Date/Time:  Sunday November 09 2016 18:06:56 EDT Ventricular Rate:  67 PR Interval:  152 QRS Duration: 80 QT Interval:  414 QTC Calculation: 437 R Axis:   11 Text Interpretation:  Normal sinus rhythm Normal ECG t wave changes resolved compared to last tracing Confirmed by Linwood Dibbles [phone]) on 11/09/2016 6:22:53 PM       Radiology Ct Head Wo Contrast  Result Date: 11/09/2016 CLINICAL DATA:  Dizziness.  Head pressure.  Vomiting. EXAM: CT HEAD WITHOUT CONTRAST TECHNIQUE: Contiguous axial images were obtained from the base of the skull through the vertex without intravenous  contrast. COMPARISON:  MRI of the head 05/03/2012 and priors. FINDINGS: Brain: No evidence of hemorrhage, hydrocephalus, extra-axial collection or mass lesion/mass effect. There is an abnormal hypoattenuated appearance of bilateral occipital lobes, which involves the deep white matter and the cortical matter. Vascular: Minimal calcific atherosclerotic disease at the skullbase. Skull: Normal. Negative for fracture or focal lesion. Sinuses/Orbits: No acute  finding. Other: None. IMPRESSION: Abnormal hypoattenuated appearance of bilateral occipital lobes, which involves the deep white matter and the cortical gray matter. Differential diagnosis includes age-indeterminate nonhemorrhagic infarct, other hypoperfusion phenomena or transient changes such as seen in migraine headaches. These results were called by telephone at the time of interpretation on 11/09/2016 at 7:59 pm to Dr. Linwood Dibbles , who verbally acknowledged these results. Electronically Signed   By: Ted Mcalpine M.D.   On: 11/09/2016 20:00   Dg Abd Acute W/chest  Result Date: 11/09/2016 CLINICAL DATA:  Epigastric abdominal pain. EXAM: DG ABDOMEN ACUTE W/ 1V CHEST COMPARISON:  Radiographs June 30, 2013. CT scan of May 16, 2015. FINDINGS: There is no evidence of dilated bowel loops or free intraperitoneal air. Status post cholecystectomy. Phleboliths are noted in the pelvis. Heart size and mediastinal contours are within normal limits. Both lungs are clear. IMPRESSION: No evidence of bowel obstruction or ileus. No acute cardiopulmonary disease. Electronically Signed   By: Lupita Raider, M.D.   On: 11/09/2016 20:19    Procedures Procedures (including critical care time)  Medications Ordered in ED Medications  sodium chloride 0.9 % bolus 1,000 mL (0 mLs Intravenous Stopped 11/09/16 2002)    And  0.9 %  sodium chloride infusion ( Intravenous New Bag/Given 11/09/16 2016)  ondansetron (ZOFRAN) injection 4 mg (4 mg Intravenous Given 11/09/16  1911)     Initial Impression / Assessment and Plan / ED Course  I have reviewed the triage vital signs and the nursing notes.  Pertinent labs & imaging results that were available during my care of the patient were reviewed by me and considered in my medical decision making (see chart for details).   Pt presents with nausea and vomiting with dizziness.  No focal neuro deficits but sx concerning for possible posterior circulation stroke.  CT scan is abnormal.  Ddx dose include ischemia.   Pt needs an MRI to evaluate further.  Unable to get that at this ED.  I will consult with hospitalist to see about transfer to [facility] for MRI, possible neuro consult  Final Clinical Impressions(s) / ED Diagnoses   Final diagnoses:  Dizziness  Abnormal CT scan    New Prescriptions New Prescriptions   No medications on file  I personally performed the services described in this documentation, which was scribed in my presence.  The recorded information has been reviewed and is accurate.     Linwood Dibbles, MD 11/09/16 2100

## 2016-11-10 ENCOUNTER — Encounter (HOSPITAL_COMMUNITY): Payer: Self-pay | Admitting: Family Medicine

## 2016-11-10 ENCOUNTER — Observation Stay (HOSPITAL_COMMUNITY): Payer: Medicare Other

## 2016-11-10 DIAGNOSIS — K219 Gastro-esophageal reflux disease without esophagitis: Secondary | ICD-10-CM

## 2016-11-10 DIAGNOSIS — R42 Dizziness and giddiness: Secondary | ICD-10-CM

## 2016-11-10 DIAGNOSIS — E876 Hypokalemia: Secondary | ICD-10-CM | POA: Diagnosis not present

## 2016-11-10 DIAGNOSIS — E039 Hypothyroidism, unspecified: Secondary | ICD-10-CM

## 2016-11-10 DIAGNOSIS — R930 Abnormal findings on diagnostic imaging of skull and head, not elsewhere classified: Secondary | ICD-10-CM | POA: Diagnosis present

## 2016-11-10 DIAGNOSIS — I10 Essential (primary) hypertension: Secondary | ICD-10-CM | POA: Diagnosis not present

## 2016-11-10 MED ORDER — TRAMADOL HCL 50 MG PO TABS
50.0000 mg | ORAL_TABLET | Freq: Three times a day (TID) | ORAL | Status: DC | PRN
  Administered 2016-11-10 (×2): 50 mg via ORAL
  Filled 2016-11-10 (×2): qty 1

## 2016-11-10 MED ORDER — ACETAMINOPHEN 325 MG PO TABS: 650.0000 mg | ORAL_TABLET | ORAL | Status: DC | PRN

## 2016-11-10 MED ORDER — SODIUM CHLORIDE 0.9 % IV SOLN
INTRAVENOUS | Status: AC
  Administered 2016-11-10: 02:00:00 via INTRAVENOUS

## 2016-11-10 MED ORDER — ENOXAPARIN SODIUM 40 MG/0.4ML ~~LOC~~ SOLN
40.0000 mg | SUBCUTANEOUS | Status: DC
  Administered 2016-11-10 – 2016-11-11 (×2): 40 mg via SUBCUTANEOUS
  Filled 2016-11-10 (×2): qty 0.4

## 2016-11-10 MED ORDER — ACETAMINOPHEN 650 MG RE SUPP: 650.0000 mg | RECTAL | Status: DC | PRN

## 2016-11-10 MED ORDER — POTASSIUM CHLORIDE CRYS ER 20 MEQ PO TBCR
20.0000 meq | EXTENDED_RELEASE_TABLET | Freq: Once | ORAL | Status: AC
  Administered 2016-11-10: 20 meq via ORAL
  Filled 2016-11-10: qty 1

## 2016-11-10 MED ORDER — LATANOPROST 0.005 % OP SOLN
1.0000 [drp] | Freq: Every day | OPHTHALMIC | Status: DC
  Administered 2016-11-10 (×2): 1 [drp] via OPHTHALMIC
  Filled 2016-11-10: qty 2.5

## 2016-11-10 MED ORDER — SIMETHICONE 80 MG PO CHEW: 80.0000 mg | CHEWABLE_TABLET | Freq: Four times a day (QID) | ORAL | Status: DC | PRN

## 2016-11-10 MED ORDER — LEVOTHYROXINE SODIUM 25 MCG PO TABS
25.0000 ug | ORAL_TABLET | Freq: Every day | ORAL | Status: DC
  Administered 2016-11-10 – 2016-11-11 (×2): 25 ug via ORAL
  Filled 2016-11-10 (×2): qty 1

## 2016-11-10 MED ORDER — ONDANSETRON HCL 4 MG/2ML IJ SOLN
4.0000 mg | Freq: Four times a day (QID) | INTRAMUSCULAR | Status: DC | PRN
  Administered 2016-11-10: 4 mg via INTRAVENOUS
  Filled 2016-11-10: qty 2

## 2016-11-10 MED ORDER — VENLAFAXINE HCL 75 MG PO TABS: 75.0000 mg | ORAL_TABLET | ORAL | Status: DC

## 2016-11-10 MED ORDER — PANTOPRAZOLE SODIUM 40 MG PO TBEC
40.0000 mg | DELAYED_RELEASE_TABLET | Freq: Every day | ORAL | Status: DC
  Administered 2016-11-10 – 2016-11-11 (×2): 40 mg via ORAL
  Filled 2016-11-10 (×2): qty 1

## 2016-11-10 MED ORDER — ACETAMINOPHEN 160 MG/5ML PO SOLN: 650.0000 mg | ORAL | Status: DC | PRN

## 2016-11-10 MED ORDER — CHOLESTYRAMINE 4 G PO PACK
4.0000 g | PACK | Freq: Every day | ORAL | Status: DC
  Administered 2016-11-10: 4 g via ORAL
  Filled 2016-11-10 (×2): qty 1

## 2016-11-10 MED ORDER — SENNOSIDES-DOCUSATE SODIUM 8.6-50 MG PO TABS: 1.0000 | ORAL_TABLET | Freq: Every evening | ORAL | Status: DC | PRN

## 2016-11-10 MED ORDER — CELECOXIB 200 MG PO CAPS
200.0000 mg | ORAL_CAPSULE | Freq: Every day | ORAL | Status: DC
  Administered 2016-11-10 – 2016-11-11 (×2): 200 mg via ORAL
  Filled 2016-11-10 (×2): qty 1

## 2016-11-10 MED ORDER — MECLIZINE HCL 25 MG PO TABS: 25.0000 mg | ORAL_TABLET | Freq: Three times a day (TID) | ORAL | Status: DC | PRN

## 2016-11-10 MED ORDER — DIPHENHYDRAMINE HCL 25 MG PO CAPS: 25.0000 mg | ORAL_CAPSULE | Freq: Four times a day (QID) | ORAL | Status: DC | PRN

## 2016-11-10 MED ORDER — FAMOTIDINE 20 MG PO TABS
20.0000 mg | ORAL_TABLET | Freq: Every day | ORAL | Status: DC
  Administered 2016-11-10 (×2): 20 mg via ORAL
  Filled 2016-11-10 (×2): qty 1

## 2016-11-10 MED ORDER — ASPIRIN EC 81 MG PO TBEC
81.0000 mg | DELAYED_RELEASE_TABLET | Freq: Every day | ORAL | Status: DC
  Administered 2016-11-10 – 2016-11-11 (×2): 81 mg via ORAL
  Filled 2016-11-10 (×2): qty 1

## 2016-11-10 MED ORDER — MAGNESIUM OXIDE 400 (241.3 MG) MG PO TABS
400.0000 mg | ORAL_TABLET | ORAL | Status: DC
  Administered 2016-11-10 – 2016-11-11 (×3): 400 mg via ORAL
  Filled 2016-11-10 (×3): qty 1

## 2016-11-10 MED ORDER — METOPROLOL TARTRATE 50 MG PO TABS
50.0000 mg | ORAL_TABLET | Freq: Two times a day (BID) | ORAL | Status: DC
  Administered 2016-11-10 (×3): 50 mg via ORAL
  Filled 2016-11-10 (×4): qty 1

## 2016-11-10 MED ORDER — CLONAZEPAM 0.5 MG PO TABS: 0.5000 mg | ORAL_TABLET | Freq: Two times a day (BID) | ORAL | Status: DC | PRN

## 2016-11-10 NOTE — Care Management Obs Status (Signed)
MEDICARE OBSERVATION STATUS NOTIFICATION   Patient Details  Name: Angela [name] MRN: [id number] Date of Birth: 1945-08-11   Medicare Observation Status Notification Given:  Yes    Epifanio LeschesCole, Jessica Checketts [place], RN 11/10/2016, 10:57 AM

## 2016-11-10 NOTE — Progress Notes (Signed)
Patient admitted after midnight.  Please see H&P.  H/o migraines. Presented with dizziness/head ache after discontinuation of Effexor due to bad dreams.  CT scan was abnormal so MRI/MRA ordered.  Patient already feeling better.    Marlin CanaryJessica Tabor Denham DO

## 2016-11-10 NOTE — H&P (Addendum)
History and Physical    Angela [name] LKG:[id number] DOB: 02-23-1946 DOA: 11/09/2016  PCP: Myrlene Broker, MD   Patient coming from: Home, by way of [facility]  Chief Complaint: Dizziness, N/V, headache   HPI: Angela [name] is a 71 y.o. female with medical history significant for hypertension, hypothyroidism, vertigo, and migraine headaches, who presented to the [facility] ED for evaluation of dizziness, headache, nausea, and nonbloody nonbilious vomiting. Patient reports that she been in her usual state of health until approximately 3 days ago when she noted the insidious onset of a mild occipital headache. She describes this as intermittent, pulsating, localized to the occiput, without alleviating or exacerbating factors identified, unlike prior headaches she has had, and associated with intermittent dizziness and nausea. She denies any recent fall or trauma, denies recent use of alcohol or illicit substances, denies change in vision or hearing, and denies any focal numbness or weakness. She reports stopping her Effexor 3 days ago due to abnormal dreams, but no other recent changes in medications. She has never experienced these symptoms previously.   [facility] ED Course: Upon arrival to the [facility] ED, patient is found to be afebrile, saturating well on room air, and with vitals otherwise stable. EKG features a normal sinus rhythm, chest x-ray is negative for acute cardiopulmonary disease, KUB is negative for obstruction or ileus, and urinalysis is unremarkable. Noncontrast head CT features abnormal hypoattenuation to the appearance of the bilateral occipital lobes with differential diagnosis including ischemic infarct and migraine. Chemistry panel features a potassium of 3.4 and CBC is within the normal limits. Patient was given a liter of normal saline and Zofran in the ED. She remained hemodynamically stable and in no apparent respiratory distress. MRI is not available at the sending  facility, and so transferred to [facility] for admission was arranged.  Review of Systems:  All other systems reviewed and apart from HPI, are negative.  Past Medical History:  Diagnosis Date  . Acne rosacea   . Anxiety   . Bile salt-induced diarrhea   . Colon polyps   . Degenerative disc disease   . Diverticulitis 05/2015  . Diverticulosis 05/10/2004  . Duodenitis 04/25/2005  . Esophageal stricture 09/13/2007  . Esophagitis 04/25/2005  . External hemorrhoids 05/10/2004  . 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 Fibromyalgia   . Gastroparesis   . GERD (gastroesophageal reflux disease) 09/13/2007  . Glaucoma   . Hiatal hernia 04/25/2005  . Hyperlipidemia   . Hypertension   . Hypothyroidism   . IBS (irritable bowel syndrome)   . Migraine   . Osteoarthritis   . Other and unspecified noninfectious gastroenteritis and colitis(558.9)   . Sessile colonic polyp 05/30/2009  . Vertigo   . Vitamin D deficiency     Past Surgical History:  Procedure Laterality Date  . BUNIONECTOMY  09/04/2011   left  . CESAREAN SECTION    . CHOLECYSTECTOMY  1985  . COLONOSCOPY    . FOOT OSTEOTOMY  2006   Right foot  . TUBAL LIGATION  1983  . VAGINAL HYSTERECTOMY  1993     reports that she has never smoked. She has never used smokeless tobacco. She reports that she does not drink alcohol or use drugs.  Allergies  Allergen Reactions  . Tetracycline Other (See Comments)    GI bleed  . Codeine Nausea And Vomiting    GI upset/vomit  . Paroxetine Rash    Family  History  Problem Relation Age of Onset  . Mitral valve prolapse Mother   . Hypertension Father   . Colon polyps Father   . Stroke Father   . Hypertension Sister        x2  . Diabetes Sister   . Colon cancer Maternal Aunt   . Stomach cancer Maternal Aunt   . Colon polyps Sister      Prior to Admission medications   Medication Sig Start Date End Date Taking? Authorizing Provider  AMBULATORY NON  FORMULARY MEDICATION Medication Name: FDgard samples given #32 lot number [id number]  Exp. 12/2016  gm 07/22/16  Yes Unk LightningLemmon, Jennifer Lynne, PA  aspirin 81 MG tablet Take 81 mg by mouth daily.     Yes [provider]  celecoxib (CELEBREX) 200 MG capsule TAKE 1 CAPSULE BY MOUTH EVERY DAY 08/11/16  Yes Myrlene Brokerrawford, Elizabeth A, MD  cholestyramine (QUESTRAN) 4 g packet TAKE 1 PACKET (4 G TOTAL) BY MOUTH DAILY. 12/20/15  Yes Rachael FeeJacobs, Daniel P, MD  clonazePAM Scarlette Calico(KLONOPIN) 0.5 MG tablet 1 po bid as needed 05/21/15  Yes Clydia LlanoElmahi, Mutaz, MD  esomeprazole (NEXIUM) 20 MG capsule Take 2 capsules 30-60 minutes before breakfast daily. 07/22/16  Yes Unk LightningLemmon, Jennifer Lynne, PA  ketoconazole (NIZORAL) 2 % cream Apply 1 application topically daily as needed for irritation.   Yes [provider]  latanoprost (XALATAN) 0.005 % ophthalmic solution Place 1 drop into both eyes at bedtime.   Yes [provider]  levothyroxine (SYNTHROID, LEVOTHROID) 50 MCG tablet TAKE 1/2 TABLET BY MOUTH DAILY 07/01/16  Yes Myrlene Brokerrawford, Elizabeth A, MD  magnesium oxide (MAG-OX) 400 MG tablet Take 400 mg by mouth every morning.   Yes [provider]  metoprolol tartrate (LOPRESSOR) 50 MG tablet Take 1 tablet (50 mg total) by mouth 2 (two) times daily. NEED OV. 11/03/16  Yes Lewayne Buntingrenshaw, Brian S, MD  metroNIDAZOLE (METROCREAM) 0.75 % cream Apply 1 application topically daily.   Yes [provider]  Probiotic Product (PROBIOTIC DAILY PO) Take 1 capsule by mouth daily.   Yes [provider]  ranitidine (ZANTAC) 150 MG tablet Take 1 tablet (150 mg total) by mouth at bedtime. 07/01/16  Yes Unk LightningLemmon, Jennifer Lynne, PA  simethicone (MYLICON) 125 MG chewable tablet Chew 125 mg by mouth every 6 (six) hours as needed for flatulence. Reported on 05/31/2015   Yes [provider]  traMADol (ULTRAM) 50 MG tablet Take 1 tablet (50 mg total) by mouth 3 (three) times daily as needed (arthritis pain). 06/30/13  Yes Michele McalpineNadel, Scott  M, MD  triamcinolone cream (KENALOG) 0.1 % Apply 1 application topically 2 (two) times daily.   Yes [provider]  venlafaxine (EFFEXOR) 75 MG tablet Take 75 mg by mouth 1 day or 1 dose.   Yes [provider]  Vitamin D, Ergocalciferol, (DRISDOL) 50000 units CAPS capsule TAKE 1 CAPSULE (50,000 UNITS TOTAL) BY MOUTH ONCE A WEEK. 10/20/16  Yes Veryl Speakalone, Gregory D, FNP    Physical Exam: Vitals:   11/09/16 2200 11/09/16 2230 11/09/16 2300 11/10/16 0035  BP: (!) 116/57 (!) 121/58 129/65 130/73  Pulse: 62 62 73 65  Resp: 11 14 10 18   Temp:    98.6 F (37 C)  TempSrc:      SpO2: 100% 100% 100% 100%  Weight:      Height:          Constitutional: NAD, calm, comfortable Eyes: PERTLA, lids and conjunctivae normal ENMT: Mucous membranes are moist. Posterior pharynx clear  of any exudate or lesions.   Neck: normal, supple, no masses, no thyromegaly Respiratory: clear to auscultation bilaterally, no wheezing, no crackles. Normal respiratory effort.   Cardiovascular: S1 & S2 heard, regular rate and rhythm, soft systolic murmur at RUSB. No carotid bruits. No significant JVD. Abdomen: No distension, no tenderness, no masses palpated. Bowel sounds normal.  Musculoskeletal: no clubbing / cyanosis. No joint deformity upper and lower extremities. Normal muscle tone.  Skin: no significant rashes, lesions, ulcers. Warm, dry, well-perfused. Neurologic: CN 2-12 grossly intact. Sensation intact, DTR normal. Strength 5/5 in all 4 limbs. No nystagmus. Psychiatric: Alert and oriented x 3. Calm and cooperative.     Labs on Admission: I have personally reviewed following labs and imaging studies  CBC:  Recent Labs Lab 11/09/16 1906  WBC 6.3  NEUTROABS 2.9  HGB 14.0  HCT 40.1  MCV 88.7  PLT 344   Basic Metabolic Panel:  Recent Labs Lab 11/09/16 1906  NA 140  K 3.4*  CL 102  CO2 28  GLUCOSE 111*  BUN 12  CREATININE 0.99  CALCIUM 9.6   GFR: Estimated Creatinine  Clearance: 49.1 mL/min (by C-G formula based on SCr of 0.99 mg/dL). Liver Function Tests:  Recent Labs Lab 11/09/16 1906  AST 30  ALT 29  ALKPHOS 80  BILITOT 0.7  PROT 7.9  ALBUMIN 4.4    Recent Labs Lab 11/09/16 1906  LIPASE 35   No results for input(s): AMMONIA in the last 168 hours. Coagulation Profile: No results for input(s): INR, PROTIME in the last 168 hours. Cardiac Enzymes: No results for input(s): CKTOTAL, CKMB, CKMBINDEX, TROPONINI in the last 168 hours. BNP (last 3 results) No results for input(s): PROBNP in the last 8760 hours. HbA1C: No results for input(s): HGBA1C in the last 72 hours. CBG: No results for input(s): GLUCAP in the last 168 hours. Lipid Profile: No results for input(s): CHOL, HDL, LDLCALC, TRIG, CHOLHDL, LDLDIRECT in the last 72 hours. Thyroid Function Tests: No results for input(s): TSH, T4TOTAL, FREET4, T3FREE, THYROIDAB in the last 72 hours. Anemia Panel: No results for input(s): VITAMINB12, FOLATE, FERRITIN, TIBC, IRON, RETICCTPCT in the last 72 hours. Urine analysis:    Component Value Date/Time   COLORURINE YELLOW 11/09/2016 1906   APPEARANCEUR CLOUDY (A) 11/09/2016 1906   LABSPEC 1.020 11/09/2016 1906   PHURINE 5.5 11/09/2016 1906   GLUCOSEU NEGATIVE 11/09/2016 1906   GLUCOSEU NEGATIVE 08/14/2008 1242   HGBUR SMALL (A) 11/09/2016 1906   BILIRUBINUR NEGATIVE 11/09/2016 1906   KETONESUR NEGATIVE 11/09/2016 1906   PROTEINUR NEGATIVE 11/09/2016 1906   UROBILINOGEN 0.2 08/14/2008 1242   NITRITE NEGATIVE 11/09/2016 1906   LEUKOCYTESUR NEGATIVE 11/09/2016 1906   Sepsis Labs: @LABRCNTIP (procalcitonin:4,lacticidven:4) )No results found for this or any previous visit (from the past 240 hour(s)).   Radiological Exams on Admission: Ct Head Wo Contrast  Result Date: 11/09/2016 CLINICAL DATA:  Dizziness.  Head pressure.  Vomiting. EXAM: CT HEAD WITHOUT CONTRAST TECHNIQUE: Contiguous axial images were obtained from the base of the skull  through the vertex without intravenous contrast. COMPARISON:  MRI of the head 05/03/2012 and priors. FINDINGS: Brain: No evidence of hemorrhage, hydrocephalus, extra-axial collection or mass lesion/mass effect. There is an abnormal hypoattenuated appearance of bilateral occipital lobes, which involves the deep white matter and the cortical matter. Vascular: Minimal calcific atherosclerotic disease at the skullbase. Skull: Normal. Negative for fracture or focal lesion. Sinuses/Orbits: No acute finding. Other: None. IMPRESSION: Abnormal hypoattenuated appearance of bilateral occipital lobes, which involves the  deep white matter and the cortical gray matter. Differential diagnosis includes age-indeterminate nonhemorrhagic infarct, other hypoperfusion phenomena or transient changes such as seen in migraine headaches. These results were called by telephone at the time of interpretation on 11/09/2016 at 7:59 pm to Dr. Linwood DibblesJON KNAPP , who verbally acknowledged these results. Electronically Signed   By: Ted Mcalpineobrinka  Dimitrova M.D.   On: 11/09/2016 20:00   Dg Abd Acute W/chest  Result Date: 11/09/2016 CLINICAL DATA:  Epigastric abdominal pain. EXAM: DG ABDOMEN ACUTE W/ 1V CHEST COMPARISON:  Radiographs June 30, 2013. CT scan of May 16, 2015. FINDINGS: There is no evidence of dilated bowel loops or free intraperitoneal air. Status post cholecystectomy. Phleboliths are noted in the pelvis. Heart size and mediastinal contours are within normal limits. Both lungs are clear. IMPRESSION: No evidence of bowel obstruction or ileus. No acute cardiopulmonary disease. Electronically Signed   By: Lupita RaiderJames  Green Jr, M.D.   On: 11/09/2016 20:19    EKG: Independently reviewed. Normal sinus rhythm.   Assessment/Plan  1. Vertigo, headache  - Pt presents with 3 days intermittent dizziness, mild occipital HA, and nausea with vomiting  - She reports chronic vertigo and chronic migraine, but the current symptoms are different from what  she has experienced in the past  - [facility] with abnormal attenuation noted in bilateral occipital lobes, non-specific and possibly representing ischemic infarct or transient phenomena such as migraine  - Plan to obtain MRI brain to further characterize the occipital abnormalities  - Monitor with frequent neuro checks, telemetry - Provide supportive care with prn meclizine, prn Zofran, IVF hydration  - PT and OT evaluations and tx requested   - Note that abrupt discontinuation of Effexor, which she did 3 days ago, can cause all of these symptoms, but she was taking a relatively-low dose, and of course this would not account for the CT abnormality  2. Hypertension - BP is at goal, continue Lopressor    3. Hypokalemia  - Serum potassium is 3.4 on admission - Likely secondary to GI-losses given recent vomiting  - Treat with 20 mEq oral potassium, repeat chem panel in am    4. Hypothyroidism  - Continue Synthroid    5. GERD - EGD in 2013 with mild gastritis and hiatal hernia  - Managed at home with daily PPI and daily H2-blocker, will continue     DVT prophylaxis: sq Lovenox Code Status: Full  Family Communication: Husband updated at bedside Disposition Plan: Observe on telemetry Consults called: None Admission status: Observation    Briscoe Deutscherimothy S Opyd, MD [facility] Pager [phone]  If 7PM-7AM, please contact night-coverage [email or web address] Password [email or web address]  11/10/2016, 12:39 AM

## 2016-11-10 NOTE — Progress Notes (Signed)
RN paged provider due to patient's BP of 118/54, HR 72 to see if patient should receive Metoprolol as ordered, order received to give Metoprolol.  Susanne BordersP.J Wilford Merryfield, RN

## 2016-11-10 NOTE — Plan of Care (Signed)
Problem: Safety: Goal: Ability to remain free from injury will improve Outcome: Progressing Pt will remain free from falls and injuries during this hospitalization.   

## 2016-11-10 NOTE — Evaluation (Signed)
Physical Therapy Evaluation Patient Details Name: Angela [name] MRN: [id number] DOB: 11-Oct-1945 Today's Date: 11/10/2016   History of Present Illness   Angela [name] is a 71 y.o. female with medical history significant for hypertension, hypothyroidism, vertigo, and migraine headaches, who presented to the [facility] ED for evaluation of dizziness, headache, nausea, and nonbloody nonbilious vomiting.  Noncontrast head CT features abnormal hypoattenuation to the appearance of the bilateral occipital lobes with differential diagnosis including ischemic infarct and migraine.  Clinical Impression  Patient presents with slight residual wooziness with certain movements during vestibular testing.  She feels initial symptoms are mostly resolved.  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  Feel continued skilled PT in the acute setting indicated to further educate pt and repeat positional tests if not d/c home today.  Feel no further skilled PT needed at d/c.     Follow Up Recommendations No PT follow up    Equipment Recommendations  None recommended by PT    Recommendations for Other Services       Precautions / Restrictions Precautions Precautions: None      Mobility  Bed Mobility Overal bed mobility: Modified Independent                Transfers Overall transfer level: Independent Equipment used: None                Ambulation/Gait Ambulation/Gait assistance: Independent Ambulation Distance (Feet): 250 Feet Assistive device: None Gait Pattern/deviations: Step-through pattern;WFL(Within Functional Limits)     General Gait Details: see DGI  Stairs Stairs: Yes Stairs assistance: Supervision Stair Management: One rail Left;Alternating pattern;Forwards Number of Stairs: 3    Wheelchair Mobility    [name] (Stroke Patients Only) [name] (Stroke Patients Only) [name]  Score: No symptoms [name]: No significant disability     Balance                                 Standardized Balance Assessment Standardized Balance Assessment : Dynamic Gait Index   Dynamic Gait Index Level Surface: Normal Change in Gait Speed: Mild Impairment Gait with Horizontal Head Turns: Normal Gait with Vertical Head Turns: Normal Gait and Pivot Turn: Normal Step Over Obstacle: Normal Step Around Obstacles: Normal Steps: Mild Impairment Total Score: 22       Pertinent Vitals/Pain Pain Assessment: No/denies pain    Home Living Family/patient expects to be discharged to:: Private residence Living Arrangements: Spouse/significant other Available Help at Discharge: Family;Available PRN/intermittently Type of Home: House Home Access: Stairs to enter Entrance Stairs-Rails: Right;Left Entrance Stairs-Number of Steps: 3 Home Layout: Two level;Able to live on main level with bedroom/bathroom Home Equipment: None      Prior Function Level of Independence: Independent               Hand Dominance   Dominant Hand: Right    Extremity/Trunk Assessment   Upper Extremity Assessment Upper Extremity Assessment: Overall WFL for tasks assessed    Lower Extremity Assessment Lower Extremity Assessment: Overall WFL for tasks assessed       Communication   Communication: No difficulties  Cognition Arousal/Alertness: Awake/alert Behavior During Therapy: WFL for tasks assessed/performed Overall Cognitive Status: Within Functional Limits for tasks assessed  General Comments        Vestibular Assessment - 11/10/16 1358      Vestibular Assessment   General Observation Reports history of vertigo that occurs spontaneously and more with position changes in the morning.  Then saw neurologist who gave Angela [name] exercises.  Usually feels spinning, but now feeling light headed along with  nausea and vomiting.  Has some "sinus" symptoms and congestion.      Symptom Behavior   Type of Dizziness Lightheadedness   Frequency of Dizziness not current, but occurs briefly with head movement, or supine <> sit   Duration of Dizziness few moments   Aggravating Factors Sit to stand;Supine to sit   Relieving Factors Rest;Slow movements     Occulomotor Exam   Occulomotor Alignment Normal   Spontaneous Absent   Gaze-induced Absent   Smooth Pursuits Intact   Saccades Intact     Vestibulo-Occular Reflex   VOR 1 Head Only (x 1 viewing) Intact for 30 sec horizontal and vertical with 4/10 symptoms   VOR to Slow Head Movement Negative right;Negative left;Positive right  positive first attempt on right, repeat negative     Auditory   Comments intact to scratch test, little dull on R due to some sinus congestion     Positional Testing   Sidelying Test Sidelying Left;Sidelying Right     Sidelying Right   Sidelying Right Duration 30sec   Sidelying Right Symptoms No nystagmus     Sidelying Left   Sidelying Left Duration 30 sec   Sidelying Left Symptoms No nystagmus        Exercises     Assessment/Plan    PT Assessment Patient needs continued PT services  PT Problem List Other (comment);Decreased activity tolerance (dizziness)       PT Treatment Interventions Patient/family education;Therapeutic exercise;Therapeutic activities;Neuromuscular re-education (vestibular rehab)    PT Goals (Current goals can be found in the Care Plan section)  Acute Rehab PT Goals Patient Stated Goal: To return to normal PT Goal Formulation: With patient/family Time For Goal Achievement: 11/12/16 Potential to Achieve Goals: Good    Frequency Min 3X/week   Barriers to discharge        Co-evaluation               AM-PAC PT "6 Clicks" Daily Activity  Outcome Measure Difficulty turning over in bed (including adjusting bedclothes, sheets and blankets)?: None Difficulty moving from  lying on back to sitting on the side of the bed? : None Difficulty sitting down on and standing up from a chair with arms (e.g., wheelchair, bedside commode, etc,.)?: A Little Help needed moving to and from a bed to chair (including a wheelchair)?: A Little Help needed walking in hospital room?: A Little Help needed climbing 3-5 steps with a railing? : A Little 6 Click Score: 20    End of Session Equipment Utilized During Treatment: Gait belt Activity Tolerance: Patient tolerated treatment well Patient left: in bed;with call bell/phone within reach;with family/visitor present   PT Visit Diagnosis: Dizziness and giddiness (R42)    Time: 4098-11911220-1256 PT Time Calculation (min) (ACUTE ONLY): 36 min   Charges:   PT Evaluation $PT Eval Moderate Complexity: 1 Mod PT Treatments $Neuromuscular Re-education: 8-22 mins   PT G Codes:   PT G-Codes **NOT FOR INPATIENT CLASS** Functional Assessment Tool Used: AM-PAC 6 Clicks Basic Mobility Functional Limitation: Mobility: Walking and moving around Mobility: Walking and Moving Around Current Status (Y7829(G8978): At least 20 percent but less than 40  percent impaired, limited or restricted Mobility: Walking and Moving Around Goal Status [phone](G8979): At least 1 percent but less than 20 percent impaired, limited or restricted    Green Valleyyndi Wynn, [place]PT [phone] 11/10/2016   Elray Mcgregorynthia Wynn 11/10/2016, 1:58 PM

## 2016-11-10 NOTE — Progress Notes (Signed)
OT Cancellation Note  Patient Details Name: Angela [name] MRN: [id number] DOB: 1946-03-24   Cancelled Treatment:    Reason Eval/Treat Not Completed: Other (comment). Pt complaining of nausea and diarrhea this pm. Will attempt tomorrow.   [facility] Hospital CenterWARD,HILLARY  Mackinze Criado, OT/L  [phone] 11/10/2016 11/10/2016, 4:23 PM

## 2016-11-11 DIAGNOSIS — E876 Hypokalemia: Secondary | ICD-10-CM

## 2016-11-11 DIAGNOSIS — I10 Essential (primary) hypertension: Secondary | ICD-10-CM | POA: Diagnosis not present

## 2016-11-11 DIAGNOSIS — R42 Dizziness and giddiness: Secondary | ICD-10-CM

## 2016-11-11 DIAGNOSIS — R938 Abnormal findings on diagnostic imaging of other specified body structures: Secondary | ICD-10-CM | POA: Diagnosis not present

## 2016-11-11 DIAGNOSIS — G43009 Migraine without aura, not intractable, without status migrainosus: Secondary | ICD-10-CM | POA: Diagnosis not present

## 2016-11-11 MED ORDER — LORATADINE 10 MG PO TABS: 10.0000 mg | ORAL_TABLET | Freq: Every day | ORAL | 0 refills | Status: DC | PRN

## 2016-11-11 MED ORDER — LORATADINE 10 MG PO TABS
10.0000 mg | ORAL_TABLET | Freq: Every day | ORAL | Status: DC
  Administered 2016-11-11: 10 mg via ORAL
  Filled 2016-11-11: qty 1

## 2016-11-11 NOTE — Progress Notes (Signed)
Angela [name] to be D/C'd Home per MD order.  Discussed with the patient and all questions fully answered.  VSS, Skin clean, dry and intact without evidence of skin break down, no evidence of skin tears noted. IV catheter discontinued intact. Site without signs and symptoms of complications. Dressing and pressure applied.  An After Visit Summary was printed and given to the patient. Patient received prescription.  D/c education completed with patient/family including follow up instructions, medication list, d/c activities limitations if indicated, with other d/c instructions as indicated by MD - patient able to verbalize understanding, all questions fully answered.   Patient instructed to return to ED, call 911, or call MD for any changes in condition.   Patient escorted via WC, and D/C home via private auto. Allergies as of 11/11/2016      Reactions   Tetracycline Other (See Comments)   GI bleed   Codeine Nausea And Vomiting, Other (See Comments)   GI upset/vomit   Paroxetine Rash      Medication List    STOP taking these medications   venlafaxine XR 75 MG 24 hr capsule Commonly known as:  EFFEXOR-XR     TAKE these medications   AMBULATORY NON FORMULARY MEDICATION Medication Name: FDgard samples given #32 lot number [id number]  Exp. 12/2016  gm What changed:  how much to take  how to take this  when to take this  additional instructions   aspirin 81 MG tablet Take 81 mg by mouth daily.   celecoxib 200 MG capsule Commonly known as:  CELEBREX TAKE 1 CAPSULE BY MOUTH EVERY DAY What changed:  See the new instructions.   cholestyramine 4 g packet Commonly known as:  QUESTRAN TAKE 1 PACKET (4 G TOTAL) BY MOUTH DAILY.   clonazePAM 0.5 MG tablet Commonly known as:  KLONOPIN 1 po bid as needed What changed:  how much to take  how to take this  when to take this  reasons to take this  additional instructions   esomeprazole 20 MG capsule Commonly known as:   NEXIUM Take 2 capsules 30-60 minutes before breakfast daily. What changed:  how much to take  how to take this  when to take this  additional instructions   ketoconazole 2 % cream Commonly known as:  NIZORAL Apply 1 application topically daily as needed for irritation.   latanoprost 0.005 % ophthalmic solution Commonly known as:  XALATAN Place 1 drop into both eyes at bedtime.   levothyroxine 50 MCG tablet Commonly known as:  SYNTHROID, LEVOTHROID TAKE 1/2 TABLET BY MOUTH DAILY What changed:  See the new instructions.   loratadine 10 MG tablet Commonly known as:  CLARITIN Take 1 tablet (10 mg total) by mouth daily as needed for allergies.   magnesium oxide 400 MG tablet Commonly known as:  MAG-OX Take 400 mg by mouth every morning.   metoprolol tartrate 50 MG tablet Commonly known as:  LOPRESSOR Take 1 tablet (50 mg total) by mouth 2 (two) times daily. NEED OV.   metroNIDAZOLE 0.75 % cream Commonly known as:  METROCREAM Apply 1 application topically daily.   PROBIOTIC DAILY PO Take 1 capsule by mouth daily.   ranitidine 150 MG tablet Commonly known as:  ZANTAC Take 1 tablet (150 mg total) by mouth at bedtime.   simethicone 125 MG chewable tablet Commonly known as:  MYLICON Chew 125 mg by mouth every 6 (six) hours as needed for flatulence. Reported on 05/31/2015   traMADol 50 MG tablet  Commonly known as:  ULTRAM Take 1 tablet (50 mg total) by mouth 3 (three) times daily as needed (arthritis pain).   triamcinolone cream 0.1 % Commonly known as:  KENALOG Apply 1 application topically 2 (two) times daily as needed (for breakout).   Vitamin D (Ergocalciferol) 50000 units Caps capsule Commonly known as:  DRISDOL TAKE 1 CAPSULE (50,000 UNITS TOTAL) BY MOUTH ONCE A WEEK. What changed:  See the new instructions.      Angela HarrisonSamantha K Keiry [name] 11/11/2016 1:32 PM

## 2016-11-11 NOTE — Discharge Summary (Signed)
Physician Discharge Summary  Angela [name] ZOX:[id number] DOB: 1945-06-23 DOA: 11/09/2016  PCP: Myrlene Broker, MD  Admit date: 11/09/2016 Discharge date: 11/11/2016   Recommendations for Outpatient Follow-Up:   1. outpatient neurology follow up   Discharge Diagnosis:   Principal Problem:   Vertigo Active Problems:   Hypothyroidism   Essential hypertension   GERD (gastroesophageal reflux disease)   Abnormal CT of the head   Hypokalemia   Dizziness   Discharge disposition:  Home  Discharge Condition: Improved.  Diet recommendation: Low sodium, heart healthy  Wound care: None.   History of Present Illness:   Angela [name] is a 71 y.o. female with medical history significant for hypertension, hypothyroidism, vertigo, and migraine headaches, who presented to the [facility] ED for evaluation of dizziness, headache, nausea, and nonbloody nonbilious vomiting. Patient reports that she been in her usual state of health until approximately 3 days ago when she noted the insidious onset of a mild occipital headache. She describes this as intermittent, pulsating, localized to the occiput, without alleviating or exacerbating factors identified, unlike prior headaches she has had, and associated with intermittent dizziness and nausea. She denies any recent fall or trauma, denies recent use of alcohol or illicit substances, denies change in vision or hearing, and denies any focal numbness or weakness. She reports stopping her Effexor 3 days ago due to abnormal dreams, but no other recent changes in medications. She has never experienced these symptoms previously.   [facility] ED Course: Upon arrival to the [facility] ED, patient is found to be afebrile, saturating well on room air, and with vitals otherwise stable. EKG features a normal sinus rhythm, chest x-ray is negative for acute cardiopulmonary disease, KUB is negative for obstruction or ileus, and urinalysis is  unremarkable. Noncontrast head CT features abnormal hypoattenuation to the appearance of the bilateral occipital lobes with differential diagnosis including ischemic infarct and migraine. Chemistry panel features a potassium of 3.4 and CBC is within the normal limits. Patient was given a liter of normal saline and Zofran in the ED. She remained hemodynamically stable and in no apparent respiratory distress. MRI is not available at the sending facility, and so transferred to [facility] for admission was arranged.   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 chronic migraine, but the current symptoms are different from what she has experienced in the past  - She had head CT at [facility] ED with abnormal attenuation noted in bilateral occipital lobes, non-specific and possibly representing ischemic infarct or transient phenomena such as migraine  - Note that abrupt discontinuation of Effexor, which she did 3 days ago, can cause all of these symptoms, but she was taking a relatively-low dose, and of course this would not account for the CT abnormality MRI brain: Normal MRI/MRA of the brain  Hypertension - BP is at goal, continue Lopressor     Hypokalemia  - replace  Hypothyroidism  - Continue Synthroid    GERD - EGD in 2013 with mild gastritis and hiatal hernia  - Managed at home with daily PPI and daily H2-blocker, will continue       Medical Consultants:    None.   Discharge Exam:   Vitals:   11/10/16 2136 11/11/16 0601  BP: 123/64 (!) 109/54  Pulse: (!) 54 (!) 59  Resp: 16   Temp: 98 F (36.7 C) 97.8 F (36.6 C)   Vitals:   11/10/16 1203 11/10/16 1429 11/10/16 2136 11/11/16 0601  BP: (!) 124/53 (!) 103/54 123/64 (!) 109/54  Pulse: (!) 58 61 (!) 54 (!) 59  Resp: 17  18 16    Temp: 98 F (36.7 C) 98.2 F (36.8 C) 98 F (36.7 C) 97.8 F (36.6 C)  TempSrc:  Oral Oral Oral  SpO2: 99% 97% 100% 98%  Weight:      Height:        Gen:  NAD   The results of significant diagnostics from this hospitalization (including imaging, microbiology, ancillary and laboratory) are listed below for reference.     Procedures and Diagnostic Studies:   Ct Head Wo Contrast  Result Date: 11/09/2016 CLINICAL DATA:  Dizziness.  Head pressure.  Vomiting. EXAM: CT HEAD WITHOUT CONTRAST TECHNIQUE: Contiguous axial images were obtained from the base of the skull through the vertex without intravenous contrast. COMPARISON:  MRI of the head 05/03/2012 and priors. FINDINGS: Brain: No evidence of hemorrhage, hydrocephalus, extra-axial collection or mass lesion/mass effect. There is an abnormal hypoattenuated appearance of bilateral occipital lobes, which involves the deep white matter and the cortical matter. Vascular: Minimal calcific atherosclerotic disease at the skullbase. Skull: Normal. Negative for fracture or focal lesion. Sinuses/Orbits: No acute finding. Other: None. IMPRESSION: Abnormal hypoattenuated appearance of bilateral occipital lobes, which involves the deep white matter and the cortical gray matter. Differential diagnosis includes age-indeterminate nonhemorrhagic infarct, other hypoperfusion phenomena or transient changes such as seen in migraine headaches. These results were called by telephone at the time of interpretation on 11/09/2016 at 7:59 pm to Dr. Linwood Dibbles , who verbally acknowledged these results. Electronically Signed   By: Ted Mcalpine M.D.   On: 11/09/2016 20:00   Mr Maxine Glenn Head Wo Contrast  Result Date: 11/10/2016 CLINICAL DATA:  Vertigo EXAM: MRI HEAD WITHOUT CONTRAST MRA HEAD WITHOUT CONTRAST TECHNIQUE: Multiplanar, multiecho pulse sequences of the brain and surrounding structures were obtained without intravenous contrast. Angiographic images of the  head were obtained using MRA technique without contrast. COMPARISON:  Head CT 11/09/2016 FINDINGS: MRI HEAD FINDINGS Brain: The midline structures are normal. No focal diffusion restriction to indicate acute infarct. No intraparenchymal hemorrhage. The brain parenchymal signal is normal. No mass lesion. No chronic microhemorrhage or cerebral amyloid angiopathy. No hydrocephalus, age advanced atrophy or lobar predominant volume loss. No dural abnormality or extra-axial collection. Skull and upper cervical spine: The visualized skull base, calvarium, upper cervical spine and extracranial soft tissues are normal. Sinuses/Orbits: No fluid levels or advanced mucosal thickening. No mastoid effusion. Normal orbits. MRA HEAD FINDINGS Intracranial internal carotid arteries: Normal. Anterior cerebral arteries: Normal. Middle cerebral arteries: Normal. Posterior communicating arteries: Present bilaterally. Posterior cerebral arteries: Fetal origin of the right PCA. Normal left PCA. Basilar artery: Normal. Vertebral arteries: Left dominant. Normal. Superior cerebellar arteries: Normal. Anterior inferior cerebellar arteries: Normal. Posterior inferior cerebellar arteries: Normal. IMPRESSION: Normal MRI/MRA of the brain. Electronically Signed   By: Deatra Robinson M.D.   On: 11/10/2016 18:15   Mr Brain Wo Contrast  Result Date: 11/10/2016 CLINICAL DATA:  Vertigo EXAM: MRI HEAD WITHOUT CONTRAST MRA HEAD WITHOUT CONTRAST TECHNIQUE: Multiplanar, multiecho pulse sequences of the brain and surrounding structures were obtained without intravenous contrast. Angiographic images of the head were obtained using MRA technique without contrast. COMPARISON:  Head CT 11/09/2016 FINDINGS: MRI HEAD FINDINGS Brain: The midline structures are  normal. No focal diffusion restriction to indicate acute infarct. No intraparenchymal hemorrhage. The brain parenchymal signal is normal. No mass lesion. No chronic microhemorrhage or cerebral amyloid  angiopathy. No hydrocephalus, age advanced atrophy or lobar predominant volume loss. No dural abnormality or extra-axial collection. Skull and upper cervical spine: The visualized skull base, calvarium, upper cervical spine and extracranial soft tissues are normal. Sinuses/Orbits: No fluid levels or advanced mucosal thickening. No mastoid effusion. Normal orbits. MRA HEAD FINDINGS Intracranial internal carotid arteries: Normal. Anterior cerebral arteries: Normal. Middle cerebral arteries: Normal. Posterior communicating arteries: Present bilaterally. Posterior cerebral arteries: Fetal origin of the right PCA. Normal left PCA. Basilar artery: Normal. Vertebral arteries: Left dominant. Normal. Superior cerebellar arteries: Normal. Anterior inferior cerebellar arteries: Normal. Posterior inferior cerebellar arteries: Normal. IMPRESSION: Normal MRI/MRA of the brain. Electronically Signed   By: Deatra RobinsonKevin  Herman M.D.   On: 11/10/2016 18:15   Dg Abd Acute W/chest  Result Date: 11/09/2016 CLINICAL DATA:  Epigastric abdominal pain. EXAM: DG ABDOMEN ACUTE W/ 1V CHEST COMPARISON:  Radiographs June 30, 2013. CT scan of May 16, 2015. FINDINGS: There is no evidence of dilated bowel loops or free intraperitoneal air. Status post cholecystectomy. Phleboliths are noted in the pelvis. Heart size and mediastinal contours are within normal limits. Both lungs are clear. IMPRESSION: No evidence of bowel obstruction or ileus. No acute cardiopulmonary disease. Electronically Signed   By: Lupita RaiderJames  Green Jr, M.D.   On: 11/09/2016 20:19     Labs:   Basic Metabolic Panel:  Recent Labs Lab 11/09/16 1906  NA 140  K 3.4*  CL 102  CO2 28  GLUCOSE 111*  BUN 12  CREATININE 0.99  CALCIUM 9.6   GFR Estimated Creatinine Clearance: 49.1 mL/min (by C-G formula based on SCr of 0.99 mg/dL). Liver Function Tests:  Recent Labs Lab 11/09/16 1906  AST 30  ALT 29  ALKPHOS 80  BILITOT 0.7  PROT 7.9  ALBUMIN 4.4    Recent  Labs Lab 11/09/16 1906  LIPASE 35   No results for input(s): AMMONIA in the last 168 hours. Coagulation profile No results for input(s): INR, PROTIME in the last 168 hours.  CBC:  Recent Labs Lab 11/09/16 1906  WBC 6.3  NEUTROABS 2.9  HGB 14.0  HCT 40.1  MCV 88.7  PLT 344   Cardiac Enzymes: No results for input(s): CKTOTAL, CKMB, CKMBINDEX, TROPONINI in the last 168 hours. BNP: Invalid input(s): POCBNP CBG: No results for input(s): GLUCAP in the last 168 hours. D-Dimer No results for input(s): DDIMER in the last 72 hours. Hgb A1c No results for input(s): HGBA1C in the last 72 hours. Lipid Profile No results for input(s): CHOL, HDL, LDLCALC, TRIG, CHOLHDL, LDLDIRECT in the last 72 hours. Thyroid function studies No results for input(s): TSH, T4TOTAL, T3FREE, THYROIDAB in the last 72 hours.  Invalid input(s): FREET3 Anemia work up No results for input(s): VITAMINB12, FOLATE, FERRITIN, TIBC, IRON, RETICCTPCT in the last 72 hours. Microbiology No results found for this or any previous visit (from the past 240 hour(s)).   Discharge Instructions:   Discharge Instructions    Diet - low sodium heart healthy    Complete by:  As directed    Increase activity slowly    Complete by:  As directed      Allergies as of 11/11/2016      Reactions   Tetracycline Other (See Comments)   GI bleed   Codeine Nausea And Vomiting, Other (See Comments)   GI upset/vomit  Paroxetine Rash      Medication List    STOP taking these medications   venlafaxine XR 75 MG 24 hr capsule Commonly known as:  EFFEXOR-XR     TAKE these medications   AMBULATORY NON FORMULARY MEDICATION Medication Name: FDgard samples given #32 lot number [id number]  Exp. 12/2016  gm What changed:  how much to take  how to take this  when to take this  additional instructions   aspirin 81 MG tablet Take 81 mg by mouth daily.   celecoxib 200 MG capsule Commonly known as:  CELEBREX TAKE 1 CAPSULE  BY MOUTH EVERY DAY What changed:  See the new instructions.   cholestyramine 4 g packet Commonly known as:  QUESTRAN TAKE 1 PACKET (4 G TOTAL) BY MOUTH DAILY.   clonazePAM 0.5 MG tablet Commonly known as:  KLONOPIN 1 po bid as needed What changed:  how much to take  how to take this  when to take this  reasons to take this  additional instructions   esomeprazole 20 MG capsule Commonly known as:  NEXIUM Take 2 capsules 30-60 minutes before breakfast daily. What changed:  how much to take  how to take this  when to take this  additional instructions   ketoconazole 2 % cream Commonly known as:  NIZORAL Apply 1 application topically daily as needed for irritation.   latanoprost 0.005 % ophthalmic solution Commonly known as:  XALATAN Place 1 drop into both eyes at bedtime.   levothyroxine 50 MCG tablet Commonly known as:  SYNTHROID, LEVOTHROID TAKE 1/2 TABLET BY MOUTH DAILY What changed:  See the new instructions.   loratadine 10 MG tablet Commonly known as:  CLARITIN Take 1 tablet (10 mg total) by mouth daily as needed for allergies.   magnesium oxide 400 MG tablet Commonly known as:  MAG-OX Take 400 mg by mouth every morning.   metoprolol tartrate 50 MG tablet Commonly known as:  LOPRESSOR Take 1 tablet (50 mg total) by mouth 2 (two) times daily. NEED OV.   metroNIDAZOLE 0.75 % cream Commonly known as:  METROCREAM Apply 1 application topically daily.   PROBIOTIC DAILY PO Take 1 capsule by mouth daily.   ranitidine 150 MG tablet Commonly known as:  ZANTAC Take 1 tablet (150 mg total) by mouth at bedtime.   simethicone 125 MG chewable tablet Commonly known as:  MYLICON Chew 125 mg by mouth every 6 (six) hours as needed for flatulence. Reported on 05/31/2015   traMADol 50 MG tablet Commonly known as:  ULTRAM Take 1 tablet (50 mg total) by mouth 3 (three) times daily as needed (arthritis pain).   triamcinolone cream 0.1 % Commonly known as:   KENALOG Apply 1 application topically 2 (two) times daily as needed (for breakout).   Vitamin D (Ergocalciferol) 50000 units Caps capsule Commonly known as:  DRISDOL TAKE 1 CAPSULE (50,000 UNITS TOTAL) BY MOUTH ONCE A WEEK. What changed:  See the new instructions.      Follow-up Information    Myrlene Brokerrawford, Elizabeth A, MD Follow up in 1 week(s).   Specialty:  Internal Medicine Contact information: [address] [place] [id number] [phone]        Amelia JoLewit, Eliot, MD Follow up.   Specialty:  Neurology Contact information: [address] [place] [address] [phone]            Time coordinating discharge: 35 min  Signed:  Mulan Adan U Nolan Lasser   [facility] 11/11/2016, 11:07 AM

## 2016-11-11 NOTE — Progress Notes (Signed)
Physical Therapy Treatment Patient Details Name: Angela [name] MRN: [id number] DOB: 17-May-1945 Today's Date: 11/11/2016    History of Present Illness  Angela [name] is a 71 y.o. female with medical history significant for hypertension, hypothyroidism, vertigo, and migraine headaches, who [facility] for evaluation of dizziness, headache, nausea, and nonbloody nonbilious vomiting.  Noncontrast head CT features abnormal hypoattenuation to the appearance of the bilateral occipital lobes with differential diagnosis including ischemic infarct and migraine.    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  No further skilled PT needs at this time, but educated that she can access outpatient Neuro PT in the future if has same type of vertigo in the future.    Follow Up Recommendations  No PT follow up     Equipment Recommendations  None recommended by PT    Recommendations for Other Services       Precautions / Restrictions Precautions Precautions: None    Mobility  Bed Mobility Overal bed mobility: Modified Independent                Transfers Overall transfer level: Modified independent Equipment used: None                Ambulation/Gait Ambulation/Gait assistance: Independent Ambulation Distance (Feet): 40 Feet Assistive device: None Gait Pattern/deviations: Step-through pattern;WFL(Within Functional Limits)         Stairs            Wheelchair Mobility    Modified Rankin (Stroke Patients Only) Modified Rankin (Stroke Patients Only) Pre-Morbid Rankin Score: No symptoms Modified Rankin: No significant disability     Balance Overall balance assessment: Modified Independent                                          Cognition Arousal/Alertness: Awake/alert Behavior During Therapy: WFL for tasks assessed/performed Overall Cognitive Status: Within Functional Limits  for tasks assessed                                        Exercises      General Comments General comments (skin integrity, edema, etc.): Performed canalith repositioning for L BPPV after L [place]pike and postive for mild symptoms possibly low intensity rotary nystagmus.  Educated in different forms of dizziness and to continue to seek medical attention for any dizziness that is different from BPPV symptoms she has had in the past.      Pertinent Vitals/Pain Pain Assessment: No/denies pain    Home Living                      Prior Function            PT Goals (current goals can now be found in the care plan section) Progress towards PT goals: Progressing toward goals    Frequency    Min 3X/week      PT Plan Current plan remains appropriate    Co-evaluation              AM-PAC PT "6 Clicks" Daily Activity  Outcome Measure  Difficulty turning over in bed (including adjusting bedclothes, sheets and blankets)?: None Difficulty moving from lying on back to sitting on the  side of the bed? : None Difficulty sitting down on and standing up from a chair with arms (e.g., wheelchair, bedside commode, etc,.)?: None Help needed moving to and from a bed to chair (including a wheelchair)?: None Help needed walking in hospital room?: None Help needed climbing 3-5 steps with a railing? : A Little 6 Click Score: 23    End of Session Equipment Utilized During Treatment: Gait belt Activity Tolerance: Patient tolerated treatment well Patient left: in bed;with call bell/phone within reach;with family/visitor present   PT Visit Diagnosis: Dizziness and giddiness (R42)     Time: 1100-1118 PT Time Calculation (min) (ACUTE ONLY): 18 min  Charges:  $Canalith Rep Proc: 8-22 mins                    G CodesSheran Lawless:       Cyndi Benjamim Harnish, [place] [phone] 11/11/2016    Elray Mcgregorynthia Severino Paolo 11/11/2016, 2:45 PM

## 2016-11-11 NOTE — Evaluation (Signed)
Occupational Therapy Evaluation Patient Details Name: Angela [name] MRN: [id number] DOB: 30-Apr-1945 Today's Date: 11/11/2016    History of Present Illness  Angela [name] is a 71 y.o. female with medical history significant for hypertension, hypothyroidism, vertigo, and migraine headaches, who presented to [facility] for evaluation of dizziness, headache, nausea, and nonbloody nonbilious vomiting.  Noncontrast head CT features abnormal hypoattenuation to the appearance of the bilateral occipital lobes with differential diagnosis including ischemic infarct and migraine.   Clinical Impression   Pt with c/o dizziness initially but reports it resolving shortly after. Pt is at overall mod I level at this time without use of AD for mobility and self care. Pt feels she is "back to normal". No futher need for OT intervention at this time. OT will sign off. Thank you for the referral.     Follow Up Recommendations  No OT follow up    Equipment Recommendations  None recommended by OT    Recommendations for Other Services Other (comment) (none needed)     Precautions / Restrictions Precautions Precautions: None      Mobility Bed Mobility Overal bed mobility: Modified Independent     Transfers Overall transfer level: Modified independent Equipment used: None         Balance Overall balance assessment: Modified Independent        ADL either performed or assessed with clinical judgement   ADL Overall ADL's : Modified independent;At baseline      General ADL Comments: Pt demonstrated functional transfers, similated shower threshold transfer, and LB clothing managament at overall mod I level     Vision Baseline Vision/History: No visual deficits              Pertinent Vitals/Pain Pain Assessment: No/denies pain     Hand Dominance Right   Extremity/Trunk Assessment Upper Extremity Assessment Upper Extremity Assessment: Overall WFL for tasks assessed   Lower  Extremity Assessment Lower Extremity Assessment: Overall WFL for tasks assessed       Communication Communication Communication: No difficulties   Cognition Arousal/Alertness: Awake/alert Behavior During Therapy: WFL for tasks assessed/performed Overall Cognitive Status: Within Functional Limits for tasks assessed                       Home Living Family/patient expects to be discharged to:: Private residence Living Arrangements: Spouse/significant other Available Help at Discharge: Family;Available PRN/intermittently Type of Home: House Home Access: Stairs to enter Entergy CorporationEntrance Stairs-Number of Steps: 3 Entrance Stairs-Rails: Right;Left Home Layout: Two level;Able to live on main level with bedroom/bathroom Alternate Level Stairs-Number of Steps: flight Alternate Level Stairs-Rails: Left Bathroom Shower/Tub: Producer, television/film/videoWalk-in shower   Bathroom Toilet: Standard     Home Equipment: None          Prior Functioning/Environment Level of Independence: Independent                          OT Goals(Current goals can be found in the care plan section) Acute Rehab OT Goals Patient Stated Goal: To return to normal OT Goal Formulation: With patient Time For Goal Achievement: 11/25/16 Potential to Achieve Goals: Good  OT Frequency:      AM-PAC PT "6 Clicks" Daily Activity     Outcome Measure Help from another person eating meals?: None Help from another person taking care of personal grooming?: None Help from another person toileting, which includes using toliet, bedpan, or urinal?: None Help from another person bathing (  including washing, rinsing, drying)?: None Help from another person to put on and taking off regular upper body clothing?: None Help from another person to put on and taking off regular lower body clothing?: None 6 Click Score: 24   End of Session Nurse Communication: Mobility status  Activity Tolerance: Patient tolerated treatment well Patient left:  in bed;with call bell/phone within reach  OT Visit Diagnosis: Muscle weakness (generalized) (M62.81)                Time: [id number] OT Time Calculation (min): 12 min Charges:  OT General Charges $OT Visit: 1 Procedure OT Evaluation $OT Eval Low Complexity: 1 Procedure G-Codes: OT G-codes **NOT FOR INPATIENT CLASS** Functional Assessment Tool Used: AM-PAC 6 Clicks Daily Activity;Clinical judgement Functional Limitation: Self care Self Care Current Status ([id number]([id number]): At least 1 percent but less than 20 percent impaired, limited or restricted Self Care Discharge Status [phone]([id number]): At least 1 percent but less than 20 percent impaired, limited or restricted    Alen BleacherBradsher, Julie-Anne Torain P, MS, OTR/L 11/11/2016, 8:34 AM

## 2016-11-12 ENCOUNTER — Other Ambulatory Visit: Payer: Self-pay | Admitting: Gastroenterology

## 2016-11-12 ENCOUNTER — Telehealth: Payer: Self-pay | Admitting: *Deleted

## 2016-11-12 NOTE — Telephone Encounter (Signed)
Transition Care Management Follow-up Telephone Call   Date discharged? 11/11/16   How have you been since you were released from the hospital? Pt states she seem to be alright   Do you understand why you were in the hospital? YES   Do you understand the discharge instructions? YES   Where were you discharged to? Home   Items Reviewed:  Medications reviewed: YES  Allergies reviewed: YES  Dietary changes reviewed: YES  Referrals reviewed: YES, she states she is waiting for appt w/neurologist   Functional Questionnaire:   Activities of Daily Living (ADLs):   She states she are independent in the following: ambulation, bathing and hygiene, feeding, continence, grooming, toileting and dressing States she doesn't require assistance    Any transportation issues/concerns?: NO   Any patient concerns? NO   Confirmed importance and date/time of follow-up visits scheduled YES, pt had called yesterday made appt for 11/18/16   Provider Appointment booked with Alysia Pennaharlotte Nche, NP since PCP is out of the office  Confirmed with patient if condition begins to worsen call PCP or go to the ER.  Patient was given the office number and encouraged to call back with question or concerns.  : YES

## 2016-11-18 ENCOUNTER — Encounter: Payer: Self-pay | Admitting: Nurse Practitioner

## 2016-11-18 ENCOUNTER — Ambulatory Visit (INDEPENDENT_AMBULATORY_CARE_PROVIDER_SITE_OTHER): Payer: Medicare Other | Admitting: Nurse Practitioner

## 2016-11-18 VITALS — BP 112/70 | HR 62 | Temp 98.7°F | Ht 61.0 in | Wt 167.0 lb

## 2016-11-18 DIAGNOSIS — T887XXA Unspecified adverse effect of drug or medicament, initial encounter: Secondary | ICD-10-CM | POA: Diagnosis not present

## 2016-11-18 DIAGNOSIS — N951 Menopausal and female climacteric states: Secondary | ICD-10-CM | POA: Diagnosis not present

## 2016-11-18 DIAGNOSIS — T50905A Adverse effect of unspecified drugs, medicaments and biological substances, initial encounter: Secondary | ICD-10-CM

## 2016-11-18 NOTE — Progress Notes (Signed)
Subjective:  Patient ID: Angela [name], female    DOB: 1946-01-10  Age: 71 y.o. MRN: [id number]  CC: Hospitalization Follow-up (hospital follow up/dizzy is getting better--Stop hot flashes med that GYN gave her--wants to get back on it. BP med consult)   HPI Hot flashes: Prescribed effexor by GYN. Has been taking medication for 6months. It caused nightmares and vivid dreams.She decided to stop medication, then developed n/v and dizziness 3days after stopping effexor. symprtoms have now resolved Used paxil in past but caused rash. Used lyrica in past but caused weight gain.  Outpatient Medications Prior to Visit  Medication Sig Dispense Refill  . AMBULATORY NON FORMULARY MEDICATION Medication Name: FDgard samples given #32 lot number [id number]  Exp. 12/2016  gm (Patient taking differently: Take 1 tablet by mouth 2 (two) times daily. Medication Name: FDgard samples given #32 lot number [id number]  Exp. 12/2016  gm) 32 capsule 0  . aspirin 81 MG tablet Take 81 mg by mouth daily.      . celecoxib (CELEBREX) 200 MG capsule TAKE 1 CAPSULE BY MOUTH EVERY DAY (Patient taking differently: TAKE 200 MG BY MOUTH EVERY DAY) 30 capsule 3  . cholestyramine (QUESTRAN) 4 g packet TAKE 1 PACKET (4 G TOTAL) BY MOUTH DAILY. 30 packet 6  . clonazePAM (KLONOPIN) 0.5 MG tablet 1 po bid as needed (Patient taking differently: Take 0.5 mg by mouth 2 (two) times daily as needed for anxiety. ) 20 tablet 0  . esomeprazole (NEXIUM) 20 MG capsule Take 2 capsules 30-60 minutes before breakfast daily. (Patient taking differently: Take 20 mg by mouth 2 (two) times daily before a meal. ) 60 capsule 3  . ketoconazole (NIZORAL) 2 % cream Apply 1 application topically daily as needed for irritation.    Marland Kitchen latanoprost (XALATAN) 0.005 % ophthalmic solution Place 1 drop into both eyes at bedtime.    Marland Kitchen levothyroxine (SYNTHROID, LEVOTHROID) 50 MCG tablet TAKE 1/2 TABLET BY MOUTH DAILY (Patient taking differently: TAKE 25 MCG BY MOUTH  DAILY) 30 tablet 5  . loratadine (CLARITIN) 10 MG tablet Take 1 tablet (10 mg total) by mouth daily as needed for allergies. 30 tablet 0  . magnesium oxide (MAG-OX) 400 MG tablet Take 400 mg by mouth every morning.    . metoprolol tartrate (LOPRESSOR) 50 MG tablet Take 1 tablet (50 mg total) by mouth 2 (two) times daily. NEED OV. 60 tablet 0  . metroNIDAZOLE (METROCREAM) 0.75 % cream Apply 1 application topically daily.    . Probiotic Product (PROBIOTIC DAILY PO) Take 1 capsule by mouth daily.    . ranitidine (ZANTAC) 150 MG tablet Take 1 tablet (150 mg total) by mouth at bedtime. 90 tablet 3  . simethicone (MYLICON) 125 MG chewable tablet Chew 125 mg by mouth every 6 (six) hours as needed for flatulence. Reported on 05/31/2015    . traMADol (ULTRAM) 50 MG tablet Take 1 tablet (50 mg total) by mouth 3 (three) times daily as needed (arthritis pain). 90 tablet 1  . triamcinolone cream (KENALOG) 0.1 % Apply 1 application topically 2 (two) times daily as needed (for breakout).     . Vitamin D, Ergocalciferol, (DRISDOL) 50000 units CAPS capsule TAKE 1 CAPSULE (50,000 UNITS TOTAL) BY MOUTH ONCE A WEEK. (Patient taking differently: TAKE 1 CAPSULE (50,000 UNITS TOTAL) BY MOUTH on Sunday) 6 capsule 1  . cholestyramine (QUESTRAN) 4 g packet TAKE 1 PACKET (4 G TOTAL) BY MOUTH DAILY. (Patient not taking: Reported on 11/18/2016) 30 packet 6  No facility-administered medications prior to visit.     ROS See HPI  Objective:  BP 112/70   Pulse 62   Temp 98.7 F (37.1 C)   Ht 5\' 1"  (1.549 m)   Wt 167 lb (75.8 kg)   SpO2 99%   BMI 31.55 kg/m   BP Readings from Last 3 Encounters:  11/18/16 112/70  11/11/16 (!) 120/59  08/05/16 118/74    Wt Readings from Last 3 Encounters:  11/18/16 167 lb (75.8 kg)  11/10/16 166 lb 4.8 oz (75.4 kg)  08/05/16 169 lb (76.7 kg)    Physical Exam  Constitutional: She is oriented to person, place, and time. No distress.  Cardiovascular: Normal rate.     Pulmonary/Chest: Effort normal.  Musculoskeletal: Normal range of motion. She exhibits no edema.  Neurological: She is alert and oriented to person, place, and time. No cranial nerve deficit.  Skin: Skin is warm and dry.  Psychiatric: She has a normal mood and affect. Her behavior is normal. Thought content normal.  Vitals reviewed.   Lab Results  Component Value Date   WBC 6.3 11/09/2016   HGB 14.0 11/09/2016   HCT 40.1 11/09/2016   PLT 344 11/09/2016   GLUCOSE 111 (H) 11/09/2016   CHOL 217 (H) 05/15/2014   TRIG 164.0 (H) 05/15/2014   HDL 60.10 05/15/2014   LDLDIRECT 126.2 09/19/2010   LDLCALC 124 (H) 05/15/2014   ALT 29 11/09/2016   AST 30 11/09/2016   NA 140 11/09/2016   K 3.4 (L) 11/09/2016   CL 102 11/09/2016   CREATININE 0.99 11/09/2016   BUN 12 11/09/2016   CO2 28 11/09/2016   TSH 0.62 06/27/2016   INR 0.94 12/15/2009   HGBA1C 6.0 06/27/2016    Ct Head Wo Contrast  Result Date: 11/09/2016 CLINICAL DATA:  Dizziness.  Head pressure.  Vomiting. EXAM: CT HEAD WITHOUT CONTRAST TECHNIQUE: Contiguous axial images were obtained from the base of the skull through the vertex without intravenous contrast. COMPARISON:  MRI of the head 05/03/2012 and priors. FINDINGS: Brain: No evidence of hemorrhage, hydrocephalus, extra-axial collection or mass lesion/mass effect. There is an abnormal hypoattenuated appearance of bilateral occipital lobes, which involves the deep white matter and the cortical matter. Vascular: Minimal calcific atherosclerotic disease at the skullbase. Skull: Normal. Negative for fracture or focal lesion. Sinuses/Orbits: No acute finding. Other: None. IMPRESSION: Abnormal hypoattenuated appearance of bilateral occipital lobes, which involves the deep white matter and the cortical gray matter. Differential diagnosis includes age-indeterminate nonhemorrhagic infarct, other hypoperfusion phenomena or transient changes such as seen in migraine headaches. These results  were called by telephone at the time of interpretation on 11/09/2016 at 7:59 pm to Dr. Linwood Dibbles , who verbally acknowledged these results. Electronically Signed   By: Ted Mcalpine M.D.   On: 11/09/2016 20:00   Mr Maxine Glenn Head Wo Contrast  Result Date: 11/10/2016 CLINICAL DATA:  Vertigo EXAM: MRI HEAD WITHOUT CONTRAST MRA HEAD WITHOUT CONTRAST TECHNIQUE: Multiplanar, multiecho pulse sequences of the brain and surrounding structures were obtained without intravenous contrast. Angiographic images of the head were obtained using MRA technique without contrast. COMPARISON:  Head CT 11/09/2016 FINDINGS: MRI HEAD FINDINGS Brain: The midline structures are normal. No focal diffusion restriction to indicate acute infarct. No intraparenchymal hemorrhage. The brain parenchymal signal is normal. No mass lesion. No chronic microhemorrhage or cerebral amyloid angiopathy. No hydrocephalus, age advanced atrophy or lobar predominant volume loss. No dural abnormality or extra-axial collection. Skull and upper cervical spine: The visualized skull  base, calvarium, upper cervical spine and extracranial soft tissues are normal. Sinuses/Orbits: No fluid levels or advanced mucosal thickening. No mastoid effusion. Normal orbits. MRA HEAD FINDINGS Intracranial internal carotid arteries: Normal. Anterior cerebral arteries: Normal. Middle cerebral arteries: Normal. Posterior communicating arteries: Present bilaterally. Posterior cerebral arteries: Fetal origin of the right PCA. Normal left PCA. Basilar artery: Normal. Vertebral arteries: Left dominant. Normal. Superior cerebellar arteries: Normal. Anterior inferior cerebellar arteries: Normal. Posterior inferior cerebellar arteries: Normal. IMPRESSION: Normal MRI/MRA of the brain. Electronically Signed   By: Deatra RobinsonKevin  Herman M.D.   On: 11/10/2016 18:15   Mr Brain Wo Contrast  Result Date: 11/10/2016 CLINICAL DATA:  Vertigo EXAM: MRI HEAD WITHOUT CONTRAST MRA HEAD WITHOUT CONTRAST  TECHNIQUE: Multiplanar, multiecho pulse sequences of the brain and surrounding structures were obtained without intravenous contrast. Angiographic images of the head were obtained using MRA technique without contrast. COMPARISON:  Head CT 11/09/2016 FINDINGS: MRI HEAD FINDINGS Brain: The midline structures are normal. No focal diffusion restriction to indicate acute infarct. No intraparenchymal hemorrhage. The brain parenchymal signal is normal. No mass lesion. No chronic microhemorrhage or cerebral amyloid angiopathy. No hydrocephalus, age advanced atrophy or lobar predominant volume loss. No dural abnormality or extra-axial collection. Skull and upper cervical spine: The visualized skull base, calvarium, upper cervical spine and extracranial soft tissues are normal. Sinuses/Orbits: No fluid levels or advanced mucosal thickening. No mastoid effusion. Normal orbits. MRA HEAD FINDINGS Intracranial internal carotid arteries: Normal. Anterior cerebral arteries: Normal. Middle cerebral arteries: Normal. Posterior communicating arteries: Present bilaterally. Posterior cerebral arteries: Fetal origin of the right PCA. Normal left PCA. Basilar artery: Normal. Vertebral arteries: Left dominant. Normal. Superior cerebellar arteries: Normal. Anterior inferior cerebellar arteries: Normal. Posterior inferior cerebellar arteries: Normal. IMPRESSION: Normal MRI/MRA of the brain. Electronically Signed   By: Deatra RobinsonKevin  Herman M.D.   On: 11/10/2016 18:15   Dg Abd Acute W/chest  Result Date: 11/09/2016 CLINICAL DATA:  Epigastric abdominal pain. EXAM: DG ABDOMEN ACUTE W/ 1V CHEST COMPARISON:  Radiographs June 30, 2013. CT scan of May 16, 2015. FINDINGS: There is no evidence of dilated bowel loops or free intraperitoneal air. Status post cholecystectomy. Phleboliths are noted in the pelvis. Heart size and mediastinal contours are within normal limits. Both lungs are clear. IMPRESSION: No evidence of bowel obstruction or ileus. No  acute cardiopulmonary disease. Electronically Signed   By: Lupita RaiderJames  Green Jr, M.D.   On: 11/09/2016 20:19    Assessment & Plan:   Eulah Citizenauline was seen today for hospitalization follow-up.  Diagnoses and all orders for this visit:  Medication reaction, initial encounter  Hot flashes due to menopause   I am having Ms. Manson PasseyBrown maintain her aspirin, latanoprost, traMADol, magnesium oxide, metroNIDAZOLE, ketoconazole, simethicone, Probiotic Product (PROBIOTIC DAILY PO), clonazePAM, triamcinolone cream, cholestyramine, levothyroxine, ranitidine, AMBULATORY NON FORMULARY MEDICATION, esomeprazole, celecoxib, Vitamin D (Ergocalciferol), metoprolol tartrate, loratadine, and cholestyramine.  No orders of the defined types were placed in this encounter.   Follow-up: Return if symptoms worsen or fail to improve.  Alysia Pennaharlotte Mandela Bello, NP

## 2016-11-18 NOTE — Patient Instructions (Addendum)
Avoid effexor and SSRI due to adverse reaction.  She is not interested in use of gabapentin due to possible weight gain.

## 2016-12-22 ENCOUNTER — Ambulatory Visit (INDEPENDENT_AMBULATORY_CARE_PROVIDER_SITE_OTHER): Payer: Medicare Other | Admitting: Family Medicine

## 2016-12-22 ENCOUNTER — Encounter: Payer: Self-pay | Admitting: Family Medicine

## 2016-12-22 VITALS — BP 112/70 | HR 79 | Temp 98.4°F | Ht 61.0 in | Wt 167.4 lb

## 2016-12-22 DIAGNOSIS — R21 Rash and other nonspecific skin eruption: Secondary | ICD-10-CM | POA: Insufficient documentation

## 2016-12-22 MED ORDER — DESONIDE 0.05 % EX CREA: TOPICAL_CREAM | Freq: Two times a day (BID) | CUTANEOUS | 0 refills | Status: DC

## 2016-12-22 NOTE — Assessment & Plan Note (Signed)
Unclear if this is associated with the flu vaccine or a bug bite. - Desonide - If no improvement Consider prednisone.

## 2016-12-22 NOTE — Progress Notes (Signed)
Angela [name] - 71 y.o. female MRN [id number]  Date of birth: [birth year]  SUBJECTIVE:  Including CC & ROS.  Chief Complaint  Patient presents with  . Rash    Patient is C/O rash that started on right wrist and traveled up the arm.  Now a spot has shown on left wrist and one on chest. Started on 9.8.18 and unsure if it is related to the flu shot she received on 9.7.18.    Angela [name] is a 71 year old female is presenting with rashes. These are occurring on her upper extremities and her chest and her neck. They are itchy in nature. There are red. She denies any bug bites. She did receive the flu vaccine on Friday. She has tried over-the-counter hydrocortisone cream with little improvement. She denies any fevers or chills. She has had bedbug bites before and this does not feel similar to that.     Review of Systems  Skin: Positive for rash.  Neurological: Negative for weakness and numbness.  Hematological: Negative for adenopathy.    HISTORY: Past Medical, Surgical, Social, and Family History Reviewed & Updated per EMR.   Pertinent Historical Findings include:  Past Medical History:  Diagnosis Date  . Acne rosacea   . Anxiety   . Bile salt-induced diarrhea   . Colon polyps   . Degenerative disc disease   . Diverticulitis 05/2015  . Diverticulosis 05/10/2004  . Duodenitis 04/25/2005  . Esophageal stricture 09/13/2007  . Esophagitis 04/25/2005  . External hemorrhoids 05/10/2004  . 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 Fibromyalgia   . Gastroparesis   . GERD (gastroesophageal reflux disease) 09/13/2007  . Glaucoma   . Hiatal hernia 04/25/2005  . Hyperlipidemia   . Hypertension   . Hypothyroidism   . IBS (irritable bowel syndrome)   . Migraine   . Osteoarthritis   . Other and unspecified noninfectious gastroenteritis and colitis(558.9)   . Sessile colonic polyp 05/30/2009  . Vertigo   . Vitamin D deficiency     Past Surgical History:  Procedure  Laterality Date  . BUNIONECTOMY  09/04/2011   left  . CESAREAN SECTION    . CHOLECYSTECTOMY  1985  . COLONOSCOPY    . FOOT OSTEOTOMY  2006   Right foot  . TUBAL LIGATION  1983  . VAGINAL HYSTERECTOMY  1993    Allergies  Allergen Reactions  . Tetracycline Other (See Comments)    GI bleed  . Effexor Xr [Venlafaxine Hcl] Other (See Comments)    Nightmare and vivid dreams  . Codeine Nausea And Vomiting and Other (See Comments)    GI upset/vomit  . Paroxetine Rash    Family History  Problem Relation Age of Onset  . Mitral valve prolapse Mother   . Hypertension Father   . Colon polyps Father   . Stroke Father   . Hypertension Sister        x2  . Diabetes Sister   . Colon cancer Maternal Aunt   . Stomach cancer Maternal Aunt   . Colon polyps Sister      Social History   Social History  . Marital status: Married    Spouse name: N/A  . Number of children: 2  . Years of education: N/A   Occupational History  . Retired    Social History Main Topics  . Smoking status: Never Smoker  . Smokeless tobacco: Never Used  . Alcohol use No  . Drug use: No  .  Sexual activity: Yes   Other Topics Concern  . Not on file   Social History Narrative  . No narrative on file     PHYSICAL EXAM:  VS: BP 112/70 (BP Location: Left Arm, Patient Position: Sitting, Cuff Size: Normal)   Pulse 79   Temp 98.4 F (36.9 C) (Oral)   Ht  (1.549 m)   Wt 167 lb 6.4 oz (75.9 kg)   SpO2 98%   BMI 31.63 kg/m  Physical Exam Gen: NAD, alert, cooperative with exam, well-appearing ENT: normal lips, normal nasal mucosa,  Eye: normal EOM, normal conjunctiva and lids CV:  no edema, +2 pedal pulses   Resp: no accessory muscle use, non-labored,  Skin: Papular lesions occurring on her upper extremities that have a red ring. No the skull formation. No coalescence. no areas of induration  Neuro: normal tone, normal sensation to touch Psych:  normal insight, alert and oriented MSK: Normal  strength, normal gait      ASSESSMENT & PLAN:   Rash Unclear if this is associated with the flu vaccine or a bug bite. - Desonide - If no improvement Consider prednisone.

## 2016-12-22 NOTE — Patient Instructions (Signed)
Thank you for coming in,   Please try Zyrtec, Allegra, or Claritin. These can help with itching. You could consider trying calamine lotion as well.  Please try the topical steroid over the areas that itch. Please let me know if there is no improvement we may need try a oral steroid.   Please feel free to call with any questions or concerns at any time, at [phone]. --Dr. Jordan LikesSchmitz

## 2017-01-09 LAB — HM MAMMOGRAPHY

## 2017-01-13 ENCOUNTER — Other Ambulatory Visit: Payer: Self-pay | Admitting: Family

## 2017-01-14 ENCOUNTER — Encounter: Payer: Self-pay | Admitting: Internal Medicine

## 2017-01-15 ENCOUNTER — Other Ambulatory Visit: Payer: Self-pay | Admitting: Cardiology

## 2017-01-15 NOTE — Telephone Encounter (Signed)
REFILL 

## 2017-01-19 ENCOUNTER — Other Ambulatory Visit: Payer: Self-pay | Admitting: Internal Medicine

## 2017-02-17 ENCOUNTER — Other Ambulatory Visit: Payer: Self-pay | Admitting: Internal Medicine

## 2017-02-19 ENCOUNTER — Encounter: Payer: Self-pay | Admitting: Family Medicine

## 2017-02-19 ENCOUNTER — Ambulatory Visit: Payer: Medicare Other | Admitting: Family Medicine

## 2017-02-19 VITALS — BP 132/74 | HR 79 | Temp 98.7°F | Ht 61.0 in | Wt 169.0 lb

## 2017-02-19 DIAGNOSIS — J0110 Acute frontal sinusitis, unspecified: Secondary | ICD-10-CM | POA: Diagnosis not present

## 2017-02-19 MED ORDER — AZITHROMYCIN 250 MG PO TABS: ORAL_TABLET | ORAL | 0 refills | Status: DC

## 2017-02-19 NOTE — Patient Instructions (Signed)
Thank you for coming in,   Please try things such as zyrtec-D or allegra-D which is an antihistamine and decongestant.   Please try afrin which will help with nasal congestion but use for only three days.   Please also try using a netti pot on a regular occasion.  Honey can help with a sore throat.      Please feel free to call with any questions or concerns at any time, at [phone]. --Dr. Schmitz  

## 2017-02-19 NOTE — Assessment & Plan Note (Signed)
Symptoms seem to be viral in nature. - Counseled on supportive care - Provided printed prescription of azithromycin if no improvement.

## 2017-02-19 NOTE — Progress Notes (Signed)
Angela [name] - 71 y.o. female MRN [id number]  Date of birth: 03-27-1946  SUBJECTIVE:  Including CC & ROS.  Chief Complaint  Patient presents with  . Cough    She states she has a cough and congested since 02/14/17.She took some mucinex this morning, no improvement.     Angela [name] is a 71 y.o. female that is presenting with sinus congestion. Her symptoms have been present for 5 days. She denies any improvement of her symptoms. Has not tried any over-the-counter medications. Denies any fevers or chills. Has not had any teeth pain. Hasn't some rhinorrhea. Has been around sick contacts. Has headaches as well.   Review of Systems  Constitutional: Negative for fever.  HENT: Positive for rhinorrhea, sinus pressure and sinus pain.   Respiratory: Positive for cough.   Cardiovascular: Negative for chest pain.    HISTORY: Past Medical, Surgical, Social, and Family History Reviewed & Updated per EMR.   Pertinent Historical Findings include:  Past Medical History:  Diagnosis Date  . Acne rosacea   . Anxiety   . Bile salt-induced diarrhea   . Colon polyps   . Degenerative disc disease   . Diverticulitis 05/2015  . Diverticulosis 05/10/2004  . Duodenitis 04/25/2005  . Esophageal stricture 09/13/2007  . Esophagitis 04/25/2005  . External hemorrhoids 05/10/2004  . 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 Fibromyalgia   . Gastroparesis   . GERD (gastroesophageal reflux disease) 09/13/2007  . Glaucoma   . Hiatal hernia 04/25/2005  . Hyperlipidemia   . Hypertension   . Hypothyroidism   . IBS (irritable bowel syndrome)   . Migraine   . Osteoarthritis   . Other and unspecified noninfectious gastroenteritis and colitis(558.9)   . Sessile colonic polyp 05/30/2009  . Vertigo   . Vitamin D deficiency     Past Surgical History:  Procedure Laterality Date  . BUNIONECTOMY  09/04/2011   left  . CESAREAN SECTION    . CHOLECYSTECTOMY  1985  . COLONOSCOPY    . FOOT  OSTEOTOMY  2006   Right foot  . TUBAL LIGATION  1983  . VAGINAL HYSTERECTOMY  1993    Allergies  Allergen Reactions  . Tetracycline Other (See Comments)    GI bleed  . Effexor Xr [Venlafaxine Hcl] Other (See Comments)    Nightmare and vivid dreams  . Codeine Nausea And Vomiting and Other (See Comments)    GI upset/vomit  . Paroxetine Rash    Family History  Problem Relation Age of Onset  . Mitral valve prolapse Mother   . Hypertension Father   . Colon polyps Father   . Stroke Father   . Hypertension Sister        x2  . Diabetes Sister   . Colon cancer Maternal Aunt   . Stomach cancer Maternal Aunt   . Colon polyps Sister      Social History   Socioeconomic History  . Marital status: Married    Spouse name: Not on file  . Number of children: 2  . Years of education: Not on file  . Highest education level: Not on file  Social Needs  . Financial resource strain: Not on file  . Food insecurity - worry: Not on file  . Food insecurity - inability: Not on file  . Transportation needs - medical: Not on file  . Transportation needs - non-medical: Not on file  Occupational History  . Occupation: Retired  Tobacco Use  .  Smoking status: Never Smoker  . Smokeless tobacco: Never Used  Substance and Sexual Activity  . Alcohol use: No  . Drug use: No  . Sexual activity: Yes  Other Topics Concern  . Not on file  Social History Narrative  . Not on file     PHYSICAL EXAM:  VS: BP 132/74 (BP Location: Left Arm, Patient Position: Sitting, Cuff Size: Normal)   Pulse 79   Temp 98.7 F (37.1 C) (Oral)   Ht 5\' 1"  (1.549 m)   Wt 169 lb (76.7 kg)   SpO2 99%   BMI 31.93 kg/m  Physical Exam Gen: NAD, alert, cooperative with exam,  ENT: normal lips, normal nasal mucosa, normal tympanic membranes bilaterally, tenderness to palpation of the frontal and maxillary sinus tenderness, normal oropharynx, no cervical lymphadenopathy Eye: normal EOM, normal conjunctiva and  lids CV:  no edema, +2 pedal pulses, S1-S2, regular rhythm    Resp: no accessory muscle use, non-labored,  clear to auscultation bilaterally, no crackles or wheezes Skin: no rashes, no areas of induration  Neuro: normal tone, normal sensation to touch Psych:  normal insight, alert and oriented MSK: normal gait, normal strength       ASSESSMENT & PLAN:   Acute non-recurrent frontal sinusitis Symptoms seem to be viral in nature. - Counseled on supportive care - Provided printed prescription of azithromycin if no improvement.

## 2017-02-23 ENCOUNTER — Other Ambulatory Visit: Payer: Self-pay | Admitting: Gastroenterology

## 2017-02-25 ENCOUNTER — Other Ambulatory Visit: Payer: Self-pay | Admitting: Internal Medicine

## 2017-03-02 ENCOUNTER — Other Ambulatory Visit (INDEPENDENT_AMBULATORY_CARE_PROVIDER_SITE_OTHER): Payer: Medicare Other

## 2017-03-02 ENCOUNTER — Telehealth: Payer: Self-pay | Admitting: *Deleted

## 2017-03-02 ENCOUNTER — Encounter: Payer: Self-pay | Admitting: Internal Medicine

## 2017-03-02 ENCOUNTER — Ambulatory Visit: Payer: Medicare Other | Admitting: Internal Medicine

## 2017-03-02 VITALS — BP 110/70 | HR 64 | Temp 98.2°F | Ht 61.0 in | Wt 168.0 lb

## 2017-03-02 DIAGNOSIS — I10 Essential (primary) hypertension: Secondary | ICD-10-CM | POA: Diagnosis not present

## 2017-03-02 DIAGNOSIS — E559 Vitamin D deficiency, unspecified: Secondary | ICD-10-CM | POA: Diagnosis not present

## 2017-03-02 DIAGNOSIS — E785 Hyperlipidemia, unspecified: Secondary | ICD-10-CM

## 2017-03-02 DIAGNOSIS — E039 Hypothyroidism, unspecified: Secondary | ICD-10-CM

## 2017-03-02 DIAGNOSIS — R7301 Impaired fasting glucose: Secondary | ICD-10-CM | POA: Diagnosis not present

## 2017-03-02 DIAGNOSIS — Z0000 Encounter for general adult medical examination without abnormal findings: Secondary | ICD-10-CM

## 2017-03-02 LAB — LIPID PANEL
CHOL/HDL RATIO: 4
Cholesterol: 190 mg/dL (ref 0–200)
HDL: 51.8 mg/dL (ref >39.00)
LDL Cholesterol: 110 mg/dL — ABNORMAL HIGH (ref 0–99)
NONHDL: 138.63
TRIGLYCERIDES: 144 mg/dL (ref 0.0–149.0)
VLDL: 28.8 mg/dL (ref 0.0–40.0)

## 2017-03-02 LAB — HEMOGLOBIN A1C: Hgb A1c MFr Bld: 5.9 % (ref 4.6–6.5)

## 2017-03-02 MED ORDER — CELECOXIB 200 MG PO CAPS
200.0000 mg | ORAL_CAPSULE | Freq: Every day | ORAL | 6 refills | Status: DC
Start: 2017-03-02 — End: 2017-10-26

## 2017-03-02 NOTE — Patient Instructions (Signed)
We are checking the labs today and will call you back about the results.    

## 2017-03-02 NOTE — Progress Notes (Signed)
   Subjective:    Patient ID: Angela [name], female    DOB: [birth year], 71 y.o.   MRN: [id number]  HPI The patient is a 71 YO female coming in for physical. No new concerns. Wonders if she is done with her vitamin D.   PMH, FMH, social history reviewed and updated.   Review of Systems  Constitutional: Negative.   HENT: Negative.   Eyes: Negative.   Respiratory: Negative for cough, chest tightness and shortness of breath.   Cardiovascular: Negative for chest pain, palpitations and leg swelling.  Gastrointestinal: Negative for abdominal distention, abdominal pain, constipation, diarrhea, nausea and vomiting.  Musculoskeletal: Negative.   Skin: Negative.   Neurological: Negative.   Psychiatric/Behavioral: Negative.       Objective:   Physical Exam  Constitutional: She is oriented to person, place, and time. She appears well-developed and well-nourished.  HENT:  Head: Normocephalic and atraumatic.  Eyes: EOM are normal.  Neck: Normal range of motion.  Cardiovascular: Normal rate and regular rhythm.  Pulmonary/Chest: Effort normal and breath sounds normal. No respiratory distress. She has no wheezes. She has no rales.  Abdominal: Soft. Bowel sounds are normal. She exhibits no distension. There is no tenderness. There is no rebound.  Musculoskeletal: She exhibits no edema.  Neurological: She is alert and oriented to person, place, and time. Coordination normal.  Skin: Skin is warm and dry.  Psychiatric: She has a normal mood and affect.   Vitals:   03/02/17 0939  BP: 110/70  Pulse: 64  Temp: 98.2 F (36.8 C)  TempSrc: Oral  SpO2: 100%  Weight: 168 lb (76.2 kg)  Height: 5\' 1"  (1.549 m)      Assessment & Plan:

## 2017-03-02 NOTE — Telephone Encounter (Signed)
Rec'd fax pt requesting refill on her Vitamin d 50,000 units take 1 a week. Last filled 12/01/16. pls advise...Angela [name]/lmb

## 2017-03-03 DIAGNOSIS — Z0000 Encounter for general adult medical examination without abnormal findings: Secondary | ICD-10-CM | POA: Insufficient documentation

## 2017-03-03 NOTE — Telephone Encounter (Signed)
Faxed request back to [facility] w/denial.../lmb

## 2017-03-03 NOTE — Assessment & Plan Note (Signed)
Declines hep c screening. Flu, pneumonia, tetanus up to date. Colonoscopy and mammogram up to date. Counseled on sun safety and mole surveillance. Given 10 year screening recommendations.

## 2017-03-03 NOTE — Assessment & Plan Note (Signed)
BP at goal on metoprolol, CMP recent without indication for change.

## 2017-03-03 NOTE — Telephone Encounter (Signed)
Should not be refilled.

## 2017-03-03 NOTE — Assessment & Plan Note (Signed)
Resolved after replacement and stopped vitamin D high dose today.

## 2017-03-03 NOTE — Assessment & Plan Note (Signed)
Synthroid 25 mcg daily and recent labs okay.

## 2017-03-03 NOTE — Assessment & Plan Note (Signed)
Checking lipid panel and adjust as needed. Not on meds.  

## 2017-03-25 ENCOUNTER — Other Ambulatory Visit: Payer: Self-pay | Admitting: Cardiology

## 2017-03-27 ENCOUNTER — Telehealth: Payer: Self-pay | Admitting: Cardiology

## 2017-03-27 MED ORDER — METOPROLOL TARTRATE 50 MG PO TABS: 50.0000 mg | ORAL_TABLET | Freq: Two times a day (BID) | ORAL | 0 refills | Status: DC

## 2017-03-27 NOTE — Telephone Encounter (Signed)
New Message   *STAT* If patient is at the pharmacy, call can be transferred to refill team.   1. Which medications need to be refilled? (please list name of each medication and dose if known) Metoprolol 50mg   2. Which pharmacy/location (including street and city if local pharmacy) is medication to be sent to? [facility] [address]  3. Do they need a 30 day or 90 day supply? 90

## 2017-03-27 NOTE — Telephone Encounter (Signed)
Rx has been sent to the pharmacy electronically. ° °

## 2017-03-30 NOTE — Progress Notes (Signed)
HPI: FU palpitations. Echo Sept 2011 showed normal LV function. A stress echocardiogram was performed in September of 2011 and revealed normal LV function and no stress-induced wall motion abnormalities. A CardioNet was performed and revealed sinus rhythm with PVCs. Palpitations treated with beta blocker. Stress echocardiogram April 2015 normal. 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 Stenosis not change compared to previous. Since I last saw her here is no dyspnea or chest pain.She has had brief palpitations described as a skip. Not sustained. No syncope.  Current Outpatient Medications  Medication Sig Dispense Refill  . AMBULATORY NON FORMULARY MEDICATION Medication Name: FDgard samples given #32 lot number [id number]  Exp. 12/2016  gm (Patient taking differently: Take 1 tablet by mouth 2 (two) times daily. Medication Name: FDgard samples given #32 lot number [id number]  Exp. 12/2016  gm) 32 capsule 0  . aspirin 81 MG tablet Take 81 mg by mouth daily.      . celecoxib (CELEBREX) 200 MG capsule Take 1 capsule (200 mg total) daily by mouth. 30 capsule 6  . cholestyramine (QUESTRAN) 4 g packet TAKE 1 PACKET (4 G TOTAL) BY MOUTH DAILY. 30 packet 6  . clonazePAM (KLONOPIN) 0.5 MG tablet 1 po bid as needed (Patient taking differently: Take 0.5 mg by mouth 2 (two) times daily as needed for anxiety. ) 20 tablet 0  . desonide (DESOWEN) 0.05 % cream Apply topically 2 (two) times daily. 30 g 0  . esomeprazole (NEXIUM) 20 MG capsule Take 2 capsules 30-60 minutes before breakfast daily. (Patient taking differently: Take 20 mg by mouth 2 (two) times daily before a meal. ) 60 capsule 3  . ketoconazole (NIZORAL) 2 % cream Apply 1 application topically daily as needed for irritation.    Marland Kitchen. latanoprost (XALATAN) 0.005 % ophthalmic solution Place 1 drop into both eyes at bedtime.    Marland Kitchen. levothyroxine (SYNTHROID, LEVOTHROID) 50 MCG tablet TAKE 1/2 TABLET BY MOUTH DAILY (Patient taking  differently: TAKE 25 MCG BY MOUTH DAILY) 30 tablet 5  . magnesium oxide (MAG-OX) 400 MG tablet Take 400 mg by mouth every morning.    . metoprolol tartrate (LOPRESSOR) 50 MG tablet Take 1 tablet (50 mg total) by mouth 2 (two) times daily. Keep office visit 12/26, for future refills 60 tablet 0  . metroNIDAZOLE (METROCREAM) 0.75 % cream Apply 1 application topically daily.    . Probiotic Product (PROBIOTIC DAILY PO) Take 1 capsule by mouth daily.    . ranitidine (ZANTAC) 150 MG tablet Take 1 tablet by mouth daily as needed.    . triamcinolone cream (KENALOG) 0.1 % Apply 1 application topically 2 (two) times daily as needed (for breakout).     . valACYclovir (VALTREX) 500 MG tablet valacyclovir hcl 500 mg tabs     No current facility-administered medications for this visit.      Past Medical History:  Diagnosis Date  . Acne rosacea   . Anxiety   . Bile salt-induced diarrhea   . Colon polyps   . Degenerative disc disease   . Diverticulitis 05/2015  . Diverticulosis 05/10/2004  . Duodenitis 04/25/2005  . Esophageal stricture 09/13/2007  . Esophagitis 04/25/2005  . External hemorrhoids 05/10/2004  . 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 Fibromyalgia   . Gastroparesis   . GERD (gastroesophageal reflux disease) 09/13/2007  . Glaucoma   . Hiatal hernia 04/25/2005  . Hyperlipidemia   . Hypertension   . Hypothyroidism   .  IBS (irritable bowel syndrome)   . Migraine   . Osteoarthritis   . Other and unspecified noninfectious gastroenteritis and colitis(558.9)   . Sessile colonic polyp 05/30/2009  . Vertigo   . Vitamin D deficiency     Past Surgical History:  Procedure Laterality Date  . BUNIONECTOMY  09/04/2011   left  . CESAREAN SECTION    . CHOLECYSTECTOMY  1985  . COLONOSCOPY    . FOOT OSTEOTOMY  2006   Right foot  . TUBAL LIGATION  1983  . VAGINAL HYSTERECTOMY  1993    Social History   Socioeconomic History  . Marital status: Married    Spouse  name: Not on file  . Number of children: 2  . Years of education: Not on file  . Highest education level: Not on file  Social Needs  . Financial resource strain: Not on file  . Food insecurity - worry: Not on file  . Food insecurity - inability: Not on file  . Transportation needs - medical: Not on file  . Transportation needs - non-medical: Not on file  Occupational History  . Occupation: Retired  Tobacco Use  . Smoking status: Never Smoker  . Smokeless tobacco: Never Used  Substance and Sexual Activity  . Alcohol use: No  . Drug use: No  . Sexual activity: Yes  Other Topics Concern  . Not on file  Social History Narrative  . Not on file    Family History  Problem Relation Age of Onset  . Mitral valve prolapse Mother   . Hypertension Father   . Colon polyps Father   . Stroke Father   . Hypertension Sister        x2  . Diabetes Sister   . Colon cancer Maternal Aunt   . Stomach cancer Maternal Aunt   . Colon polyps Sister     ROS: no fevers or chills, productive cough, hemoptysis, dysphasia, odynophagia, melena, hematochezia, dysuria, hematuria, rash, seizure activity, orthopnea, PND, pedal edema, claudication. Remaining systems are negative.  Physical Exam: Well-developed well-nourished in no acute distress.  Skin is warm and dry.  HEENT is normal.  Neck is supple.  Chest is clear to auscultation with normal expansion.  Cardiovascular exam is regular rate and rhythm.  Abdominal exam nontender or distended. No masses palpated. Extremities show no edema. neuro grossly intact   A/P  1 palpitations-symptoms are reasonably well-controlled. Continue beta blocker.  2 history of fibromuscular dysplasia, carotids[facility]  3 hypertension-blood pressure is controlled. Continue present medications.   Olga MillersBrian Waco Foerster, MD

## 2017-04-06 DIAGNOSIS — K589 Irritable bowel syndrome without diarrhea: Secondary | ICD-10-CM | POA: Insufficient documentation

## 2017-04-06 DIAGNOSIS — K9089 Other intestinal malabsorption: Secondary | ICD-10-CM | POA: Insufficient documentation

## 2017-04-06 DIAGNOSIS — L719 Rosacea, unspecified: Secondary | ICD-10-CM | POA: Insufficient documentation

## 2017-04-06 DIAGNOSIS — K635 Polyp of colon: Secondary | ICD-10-CM | POA: Insufficient documentation

## 2017-04-06 DIAGNOSIS — I10 Essential (primary) hypertension: Secondary | ICD-10-CM | POA: Insufficient documentation

## 2017-04-06 DIAGNOSIS — M1990 Unspecified osteoarthritis, unspecified site: Secondary | ICD-10-CM | POA: Insufficient documentation

## 2017-04-06 DIAGNOSIS — K3184 Gastroparesis: Secondary | ICD-10-CM | POA: Insufficient documentation

## 2017-04-06 DIAGNOSIS — F419 Anxiety disorder, unspecified: Secondary | ICD-10-CM | POA: Insufficient documentation

## 2017-04-06 DIAGNOSIS — H409 Unspecified glaucoma: Secondary | ICD-10-CM | POA: Insufficient documentation

## 2017-04-08 ENCOUNTER — Encounter: Payer: Self-pay | Admitting: Cardiology

## 2017-04-08 ENCOUNTER — Ambulatory Visit: Payer: Medicare Other | Admitting: Cardiology

## 2017-04-08 VITALS — BP 118/62 | HR 64

## 2017-04-08 DIAGNOSIS — R002 Palpitations: Secondary | ICD-10-CM | POA: Diagnosis not present

## 2017-04-08 DIAGNOSIS — I10 Essential (primary) hypertension: Secondary | ICD-10-CM | POA: Diagnosis not present

## 2017-04-08 DIAGNOSIS — I773 Arterial fibromuscular dysplasia: Secondary | ICD-10-CM | POA: Diagnosis not present

## 2017-04-08 NOTE — Patient Instructions (Signed)
Your physician wants you to follow-up in: ONE YEAR WITH DR CRENSHAW You will receive a reminder letter in the mail two months in advance. If you don't receive a letter, please call our office to schedule the follow-up appointment.   If you need a refill on your cardiac medications before your next appointment, please call your pharmacy.  

## 2017-05-13 ENCOUNTER — Other Ambulatory Visit: Payer: Self-pay | Admitting: Cardiology

## 2017-05-21 ENCOUNTER — Ambulatory Visit: Payer: Medicare Other | Admitting: Physician Assistant

## 2017-05-21 ENCOUNTER — Encounter: Payer: Self-pay | Admitting: Physician Assistant

## 2017-05-21 VITALS — BP 100/62 | HR 62 | Ht 61.0 in | Wt 165.0 lb

## 2017-05-21 DIAGNOSIS — R12 Heartburn: Secondary | ICD-10-CM | POA: Diagnosis not present

## 2017-05-21 DIAGNOSIS — K589 Irritable bowel syndrome without diarrhea: Secondary | ICD-10-CM

## 2017-05-21 DIAGNOSIS — K219 Gastro-esophageal reflux disease without esophagitis: Secondary | ICD-10-CM | POA: Diagnosis not present

## 2017-05-21 DIAGNOSIS — R1012 Left upper quadrant pain: Secondary | ICD-10-CM | POA: Diagnosis not present

## 2017-05-21 NOTE — Progress Notes (Signed)
Chief Complaint: Bloating, Heartburn, LUQ bulging  Review of pertinent gastrointestinal problems: 1. Routine risk for colon cancer:  Colonsocopy 10/2011 Dr. Jarold Motto, no polyps, he recommended repeat screening colonoscopy in 10 years 2. Extensive left sided diverticulosis, noted colonoscopy 2013; Acute sigmoid diverticulitis (confirmed on CT scan) 05/2015, also WBC 13K 3. Large HH. EGD 10/2011 Dr. Jarold Motto 4. Chronic intermittent LUQ discomfort, bulging sensation: eventual CT scan 12/2014 showed no findings to account for the bulge, symptoms.  HPI:    [name] is a 72 year old African-American femalemale with a past medical history as listed below, who is known to Dr. Christella Hartigan and presents to clinic today with a complaint of abdominal bulging, bloating and heartburn.    See above, patient does have history of chronic intermittent left upper quadrant discomfort described as bulging, CT scan 2016 showed no findings to account for the bulge or symptoms.    Patient was last seen in clinic 71/24/18 by me and at that time her chronic bloating and left upper quadrant discomfort was gone after using FD Gard 2 tabs twice a day for 2 weeks.  Her GERD was controlled on Nexium 40 mg in the morning and Zantac 150 mg every afternoon.    Today, the patient presents clinic and explains that she never really felt like her left upper quadrant pain or discomfort was completely gone or her bloating.  She did discontinue her FD Delene Ruffini as she thought this is what she was told to do at her last appointment.  She describes her chronic left upper quadrant discomfort and "bulging".  She describes occasionally she will have a "sharp pain there".  The patient tells me this can radiate into her chest and she will drink some ginger ale and when she belches this will feel better.  She also complains of bloating and passing gas to the point where she "cannot control it".  Patient also describes "rumbling on the left side" if she lays down  on that side at night.    Patient also describes an increase in her heartburn symptoms.  Upon being questioned the patient tells me she is only using her Nexium 20 mg once daily in the morning and Zantac 150 mg at night.    Patient denies fever, chills, blood in her stool, melena, weight loss, anorexia, nausea, vomiting, change in bowel habits or symptoms that awaken her at night.  Past Medical History:  Diagnosis Date  . Acne rosacea   . Anxiety   . Bile salt-induced diarrhea   . Colon polyps   . Degenerative disc disease   . Diverticulitis 05/2015  . Diverticulosis 05/10/2004  . Duodenitis 04/25/2005  . Esophageal stricture 09/13/2007  . Esophagitis 04/25/2005  . External hemorrhoids 05/10/2004  . 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 Fibromyalgia   . Gastroparesis   . GERD (gastroesophageal reflux disease) 09/13/2007  . Glaucoma   . Hiatal hernia 04/25/2005  . Hyperlipidemia   . Hypertension   . Hypothyroidism   . IBS (irritable bowel syndrome)   . Migraine   . Osteoarthritis   . Other and unspecified noninfectious gastroenteritis and colitis(558.9)   . Sessile colonic polyp 05/30/2009  . Vertigo   . Vitamin D deficiency     Past Surgical History:  Procedure Laterality Date  . BUNIONECTOMY  09/04/2011   left  . CESAREAN SECTION    . CHOLECYSTECTOMY  1985  . COLONOSCOPY    . FOOT OSTEOTOMY  2006  Right foot  . TUBAL LIGATION  1983  . VAGINAL HYSTERECTOMY  1993    Current Outpatient Medications  Medication Sig Dispense Refill  . AMBULATORY NON FORMULARY MEDICATION Medication Name: FDgard samples given #32 lot number [id number]  Exp. 12/2016  gm (Patient taking differently: Take 1 tablet by mouth 2 (two) times daily. Medication Name: FDgard samples given #32 lot number [id number]  Exp. 12/2016  gm) 32 capsule 0  . aspirin 81 MG tablet Take 81 mg by mouth daily.      . celecoxib (CELEBREX) 200 MG capsule Take 1 capsule (200 mg total) daily by mouth. 30  capsule 6  . cholestyramine (QUESTRAN) 4 g packet TAKE 1 PACKET (4 G TOTAL) BY MOUTH DAILY. 30 packet 6  . clonazePAM (KLONOPIN) 0.5 MG tablet 1 po bid as needed (Patient taking differently: Take 0.5 mg by mouth 2 (two) times daily as needed for anxiety. ) 20 tablet 0  . desonide (DESOWEN) 0.05 % cream Apply topically 2 (two) times daily. 30 g 0  . esomeprazole (NEXIUM) 20 MG capsule Take 2 capsules 30-60 minutes before breakfast daily. (Patient taking differently: Take 20 mg by mouth 2 (two) times daily before a meal. ) 60 capsule 3  . ketoconazole (NIZORAL) 2 % cream Apply 1 application topically daily as needed for irritation.    Marland Kitchen latanoprost (XALATAN) 0.005 % ophthalmic solution Place 1 drop into both eyes at bedtime.    Marland Kitchen levothyroxine (SYNTHROID, LEVOTHROID) 50 MCG tablet TAKE 1/2 TABLET BY MOUTH DAILY (Patient taking differently: TAKE 25 MCG BY MOUTH DAILY) 30 tablet 5  . magnesium oxide (MAG-OX) 400 MG tablet Take 400 mg by mouth every morning.    . metoprolol tartrate (LOPRESSOR) 50 MG tablet TAKE 1 TABLET BY MOUTH 2 TIMES DAILY. KEEP OFFICE VISIT 12/26, FOR FUTURE REFILLS 60 tablet 0  . metroNIDAZOLE (METROCREAM) 0.75 % cream Apply 1 application topically daily.    . Probiotic Product (PROBIOTIC DAILY PO) Take 1 capsule by mouth daily.    . ranitidine (ZANTAC) 150 MG tablet Take 1 tablet by mouth daily as needed.    . triamcinolone cream (KENALOG) 0.1 % Apply 1 application topically 2 (two) times daily as needed (for breakout).     . valACYclovir (VALTREX) 500 MG tablet valacyclovir hcl 500 mg tabs     No current facility-administered medications for this visit.     Allergies as of 05/21/2017 - Review Complete 05/21/2017  Allergen Reaction Noted  . Tetracycline Other (See Comments)   . Effexor xr [venlafaxine hcl] Other (See Comments) 11/18/2016  . Codeine Nausea And Vomiting and Other (See Comments)   . Paroxetine Rash 03/27/2015    Family History  Problem Relation Age of  Onset  . Mitral valve prolapse Mother   . Hypertension Father   . Colon polyps Father   . Stroke Father   . Hypertension Sister        x2  . Diabetes Sister   . Colon cancer Maternal Aunt   . Stomach cancer Maternal Aunt   . Colon polyps Sister     Social History   Socioeconomic History  . Marital status: Married    Spouse name: Not on file  . Number of children: 2  . Years of education: Not on file  . Highest education level: Not on file  Social Needs  . Financial resource strain: Not on file  . Food insecurity - worry: Not on file  . Food insecurity - inability: Not on  file  . Transportation needs - medical: Not on file  . Transportation needs - non-medical: Not on file  Occupational History  . Occupation: Retired  Tobacco Use  . Smoking status: Never Smoker  . Smokeless tobacco: Never Used  Substance and Sexual Activity  . Alcohol use: No  . Drug use: No  . Sexual activity: Yes  Other Topics Concern  . Not on file  Social History Narrative  . Not on file    Review of Systems:    Constitutional: No weight loss, fever or chills Cardiovascular: No chest pain Respiratory: No SOB  Gastrointestinal: See HPI and otherwise negative   Physical Exam:  Vital signs: BP 100/62   Pulse 62   Ht 5\' 1"  (1.549 m)   Wt 165 lb (74.8 kg)   BMI 31.18 kg/m   Constitutional:   Pleasant AA female appears to be in NAD, Well developed, Well nourished, alert and cooperative Respiratory: Respirations even and unlabored. Lungs clear to auscultation bilaterally.   No wheezes, crackles, or rhonchi.  Cardiovascular: Normal S1, S2. No MRG. Regular rate and rhythm. No peripheral edema, cyanosis or pallor.  Gastrointestinal:  Soft, nondistended, nontender. No rebound or guarding. Normal bowel sounds. No appreciable masses or hepatomegaly. Scar from previous cholecystectomy across RUQ, upon palpation LUQ is slightly "bulged" with no visible mass or lesion Psychiatric: Oriented to person,  place and time. Demonstrates good judgement and reason without abnormal affect or behaviors.  RELEVANT LABS AND IMAGING: CBC    Component Value Date/Time   WBC 6.3 11/09/2016 1906   RBC 4.52 11/09/2016 1906   HGB 14.0 11/09/2016 1906   HCT 40.1 11/09/2016 1906   PLT 344 11/09/2016 1906   MCV 88.7 11/09/2016 1906   MCH 31.0 11/09/2016 1906   MCHC 34.9 11/09/2016 1906   RDW 14.0 11/09/2016 1906   LYMPHSABS 2.6 11/09/2016 1906   MONOABS 0.6 11/09/2016 1906   EOSABS 0.1 11/09/2016 1906   BASOSABS 0.0 11/09/2016 1906    CMP     Component Value Date/Time   NA 140 11/09/2016 1906   K 3.4 (L) 11/09/2016 1906   CL 102 11/09/2016 1906   CO2 28 11/09/2016 1906   GLUCOSE 111 (H) 11/09/2016 1906   BUN 12 11/09/2016 1906   CREATININE 0.99 11/09/2016 1906   CALCIUM 9.6 11/09/2016 1906   PROT 7.9 11/09/2016 1906   ALBUMIN 4.4 11/09/2016 1906   AST 30 11/09/2016 1906   ALT 29 11/09/2016 1906   ALKPHOS 80 11/09/2016 1906   BILITOT 0.7 11/09/2016 1906   GFRNONAA 56 (L) 11/09/2016 1906   GFRAA >60 11/09/2016 1906    Assessment: 1.  IBS: Patient describes this as bloating and "rumbling" in her stomach, this has resolved in the past with FD Delene Ruffini, will try again 2.  Left upper quadrant bulging: Chronic for the patient, patient has had workup for this in the past, this complaint is no different than previous, she had a CT in 2016 with no etiology, on exam she does have a long cholecystectomy scar and we discussed how fat can be dispersed differently after surgery 3.  Heartburn: Increasing symptoms over the past 4-5 months, upon questioning patient is only using Nexium 20 mg once in the morning instead of the 40 mg as prescribed  Plan: 1.  Recommend the patient restart her FD Delene Ruffini 2 capsules twice daily.  Did provide her with a coupon.  She should continue this until time of next visit and we will discuss this  going forward. 2.  Would recommend the patient increase her Nexium to two 20 mg  tabs in the morning. 3.  Patient to continue her Zantac 150 mg at night. 4.  Could discuss low FODMAP diet in the future if symptoms unresolved. 5.  Patient to follow in clinic with me in 4-6 weeks or sooner if necessary.  Hyacinth MeekerJennifer Shiven Junious, PA-C [facility] 05/21/2017, 1:53 PM  Cc: Myrlene Brokerrawford, Elizabeth A, *

## 2017-05-21 NOTE — Patient Instructions (Signed)
Start FD gard 2 capsules twice a day. Continue until your next appointment.   Take Nexium 20 mg 2 tabs every morning.   Continue Zantac at bedtime.

## 2017-05-22 NOTE — Progress Notes (Signed)
I agree with the above note, plan 

## 2017-06-18 ENCOUNTER — Ambulatory Visit: Payer: Medicare Other | Admitting: Physician Assistant

## 2017-06-19 ENCOUNTER — Other Ambulatory Visit: Payer: Self-pay | Admitting: Internal Medicine

## 2017-07-13 ENCOUNTER — Other Ambulatory Visit: Payer: Self-pay | Admitting: Cardiology

## 2017-07-14 ENCOUNTER — Ambulatory Visit: Payer: Medicare Other | Admitting: Gastroenterology

## 2017-07-14 ENCOUNTER — Encounter: Payer: Self-pay | Admitting: Gastroenterology

## 2017-07-14 ENCOUNTER — Other Ambulatory Visit (INDEPENDENT_AMBULATORY_CARE_PROVIDER_SITE_OTHER): Payer: Medicare Other

## 2017-07-14 VITALS — BP 110/66 | HR 68 | Ht 61.0 in | Wt 169.0 lb

## 2017-07-14 DIAGNOSIS — R1012 Left upper quadrant pain: Secondary | ICD-10-CM | POA: Diagnosis not present

## 2017-07-14 LAB — CREATININE, SERUM: Creatinine, Ser: 0.8 mg/dL (ref 0.40–1.20)

## 2017-07-14 LAB — BUN: BUN: 12 mg/dL (ref 6–23)

## 2017-07-14 NOTE — Progress Notes (Signed)
Review of pertinent gastrointestinal problems: 1. Routine risk for colon cancer: Colonsocopy 10/2011 Dr. Jarold Motto, no polyps, he recommended repeat screening colonoscopy in 10 years 2. Extensive left sided diverticulosis, noted colonoscopy 2013; Acute sigmoid diverticulitis (confirmed on CT scan) 05/2015, also WBC 13K 3. Large HH. EGD 10/2011 Dr. Jarold Motto 4. Chronic intermittent LUQ discomfort, bulging sensation: eventual CT scan 12/2014 showed no findings to account for the bulge, symptoms.   HPI: [name]  She has been here more  recently and seen Victorino Dike.    She was here in our office About 2 months ago and she saw [place].  Complaining of her usual chronic left upper quadrant pains, bulging.  She was recommended to retry FD Gard, increase her Nexium to 40 mg every morning, continue Zantac at bedtime, fodmap diet.   Daily discomfort, LUQ pains. Bloating type discomfort. Belches a lot and passes a lot of gas.   She feels a bulging in her left upper quadrant.  This is quite bothersome to her.   Chief complaint is left upper quadrant discomfort, bulging  ROS: complete GI ROS as described in HPI, all other review negative.  Up 5 poinds since last visit 2 months ago.  Constitutional:  No unintentional weight loss   Past Medical History:  Diagnosis Date  . Acne rosacea   . Anxiety   . Bile salt-induced diarrhea   . Colon polyps   . Degenerative disc disease   . Diverticulitis 05/2015  . Diverticulosis 05/10/2004  . Duodenitis 04/25/2005  . Esophageal stricture 09/13/2007  . Esophagitis 04/25/2005  . External hemorrhoids 05/10/2004  . 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 Fibromyalgia   . Gastroparesis   . GERD (gastroesophageal reflux disease) 09/13/2007  . Glaucoma   . Hiatal hernia 04/25/2005  . Hyperlipidemia   . Hypertension   . Hypothyroidism   . IBS (irritable bowel  syndrome)   . Migraine   . Osteoarthritis   . Other and unspecified noninfectious gastroenteritis and colitis(558.9)   . Sessile colonic polyp 05/30/2009  . Vertigo   . Vitamin D deficiency     Past Surgical History:  Procedure Laterality Date  . BUNIONECTOMY  09/04/2011   left  . CESAREAN SECTION    . CHOLECYSTECTOMY  1985  . COLONOSCOPY    . FOOT OSTEOTOMY  2006   Right foot  . TUBAL LIGATION  1983  . VAGINAL HYSTERECTOMY  1993    Current Outpatient Medications  Medication Sig Dispense Refill  . AMBULATORY NON FORMULARY MEDICATION Medication Name: FDgard samples given #32 lot number [id number]  Exp. 12/2016  gm (Patient taking differently: Take 1 tablet by mouth 2 (two) times daily. Medication Name: FDgard samples given #32 lot number [id number]  Exp. 12/2016  gm) 32 capsule 0  . aspirin 81 MG tablet Take 81 mg by mouth daily.      . celecoxib (CELEBREX) 200 MG capsule Take 1 capsule (200 mg total) daily by mouth. 30 capsule 6  . cholestyramine (QUESTRAN) 4 g packet TAKE 1 PACKET (4 G TOTAL) BY MOUTH DAILY. 30 packet 6  . clonazePAM (KLONOPIN) 0.5 MG tablet 1 po bid as needed (Patient taking differently: Take 0.5 mg by mouth 2 (two) times daily as needed for anxiety. ) 20 tablet 0  . desonide (DESOWEN) 0.05 % cream Apply topically 2 (two) times daily. 30 g 0  .  esomeprazole (NEXIUM) 20 MG capsule Take 2 capsules 30-60 minutes before breakfast daily. (Patient taking differently: Take 20 mg by mouth 2 (two) times daily before a meal. ) 60 capsule 3  . ketoconazole (NIZORAL) 2 % cream Apply 1 application topically daily as needed for irritation.    Marland Kitchen latanoprost (XALATAN) 0.005 % ophthalmic solution Place 1 drop into both eyes at bedtime.    Marland Kitchen levothyroxine (SYNTHROID, LEVOTHROID) 50 MCG tablet TAKE 1/2 TABLET BY MOUTH DAILY 30 tablet 2  . magnesium oxide (MAG-OX) 400 MG tablet Take 400 mg by mouth every morning.    . metoprolol tartrate (LOPRESSOR) 50 MG tablet Take 1 TABLET BY MOUTH 2 TIMES  A DAY 60 tablet 8  . metroNIDAZOLE (METROCREAM) 0.75 % cream Apply 1 application topically daily.    . Probiotic Product (PROBIOTIC DAILY PO) Take 1 capsule by mouth daily.    . ranitidine (ZANTAC) 150 MG tablet Take 1 tablet by mouth daily as needed.    . triamcinolone cream (KENALOG) 0.1 % Apply 1 application topically 2 (two) times daily as needed (for breakout).     . valACYclovir (VALTREX) 500 MG tablet valacyclovir hcl 500 mg tabs     No current facility-administered medications for this visit.     Allergies as of 07/14/2017 - Review Complete 07/14/2017  Allergen Reaction Noted  . Tetracycline Other (See Comments)   . Effexor xr [venlafaxine hcl] Other (See Comments) 11/18/2016  . Codeine Nausea And Vomiting and Other (See Comments)   . Paroxetine Rash 03/27/2015    Family History  Problem Relation Age of Onset  . Mitral valve prolapse Mother   . Hypertension Father   . Colon polyps Father   . Stroke Father   . Hypertension Sister        x2  . Diabetes Sister   . Colon cancer Maternal Aunt   . Stomach cancer Maternal Aunt   . Colon polyps Sister     Social History   Socioeconomic History  . Marital status: Married    Spouse name: Not on file  . Number of children: 2  . Years of education: Not on file  . Highest education level: Not on file  Occupational History  . Occupation: Retired  Engineer, production  . Financial resource strain: Not on file  . Food insecurity:    Worry: Not on file    Inability: Not on file  . Transportation needs:    Medical: Not on file    Non-medical: Not on file  Tobacco Use  . Smoking status: Never Smoker  . Smokeless tobacco: Never Used  Substance and Sexual Activity  . Alcohol use: No  . Drug use: No  . Sexual activity: Yes  Lifestyle  . Physical activity:    Days per week: Not on file    Minutes per session: Not on file  . Stress: Not on file  Relationships  . Social connections:    Talks on phone: Not on file    Gets  together: Not on file    Attends religious service: Not on file    Active member of club or organization: Not on file    Attends meetings of clubs or organizations: Not on file    Relationship status: Not on file  . Intimate partner violence:    Fear of current or ex partner: Not on file    Emotionally abused: Not on file    Physically abused: Not on file    Forced sexual  activity: Not on file  Other Topics Concern  . Not on file  Social History Narrative  . Not on file     Physical Exam: BP 110/66   Pulse 68   Ht 5\' 1"  (1.549 m)   Wt 169 lb (76.7 kg)   BMI 31.93 kg/m  Constitutional: generally well-appearing Psychiatric: alert and oriented x3 Abdomen: soft, nontender, nondistended, no obvious ascites, no peritoneal signs, normal bowel sounds; I did not detect a significant bulge in her abdomen when she was laying on her right side which is when she usually can feel it. No peripheral edema noted in lower extremities  Assessment and plan: 72 y.o. female with chronic left upper quadrant discomfort, bloating, bulge  I think this is unlikely from anything serious.  I did recommend a CT scan abdomen pelvis to exclude significant pathology, check for hernia.  Please see the "Patient Instructions" section for addition details about the plan.  Rob Buntinganiel Leaf Kernodle, MD [facility] 07/14/2017, 3:46 PM

## 2017-07-14 NOTE — Patient Instructions (Addendum)
You will be set up for a CT scan of abdomen and pelvis with IV and oral contrast for LUQ pains, chronic.  Feels a bulging in LUQ.  You have been scheduled for a CT scan of the abdomen and pelvis at [facility] ([address]---this is in the same building as [facility]).   You are scheduled on 07/20/17 at 1:30pm. You should arrive 15 minutes prior to your appointment time for registration. Please follow the written instructions below on the day of your exam:  WARNING: IF YOU ARE ALLERGIC TO IODINE/X-RAY DYE, PLEASE NOTIFY RADIOLOGY IMMEDIATELY AT [phone]! YOU WILL BE GIVEN A 13 HOUR PREMEDICATION PREP.  1) Do not eat or drink anything after 9:30am (4 hours prior to your test) 2) You have been given 2 bottles of oral contrast to drink. The solution may taste better if refrigerated, but do NOT add ice or any other liquid to this solution. Shake well before drinking.    Drink 1 bottle of contrast @ 11:30am (2 hours prior to your exam)  Drink 1 bottle of contrast @ 12:30am (1 hour prior to your exam)  You may take any medications as prescribed with a small amount of water except for the following: Metformin, Glucophage, Glucovance, Avandamet, Riomet, Fortamet, Actoplus Met, Janumet, Glumetza or Metaglip. The above medications must be held the day of the exam AND 48 hours after the exam.  The purpose of you drinking the oral contrast is to aid in the visualization of your intestinal tract. The contrast solution may cause some diarrhea. Before your exam is started, you will be given a small amount of fluid to drink. Depending on your individual set of symptoms, you may also receive an intravenous injection of x-ray contrast/dye. Plan on being at [facility] for 30 minutes or longer, depending on the type of exam you are having performed.  This test typically takes 30-45 minutes to complete.  If you have any questions regarding your exam or if you need to reschedule, you  may call the CT department at [phone] between the hours of 8:00 am and 5:00 pm, Monday-Friday.  __________________________________________________________________  Your physician has requested that you go to the basement for   lab work before leaving today:  Normal BMI (Body Mass Index- based on height and weight) is between 23 and 30. Your BMI today is Body mass index is 31.93 kg/m. . Please consider follow up  regarding your BMI with your Primary Care Provider.  ______   

## 2017-07-18 ENCOUNTER — Other Ambulatory Visit: Payer: Self-pay | Admitting: Internal Medicine

## 2017-07-20 ENCOUNTER — Ambulatory Visit (INDEPENDENT_AMBULATORY_CARE_PROVIDER_SITE_OTHER)
Admission: RE | Admit: 2017-07-20 | Discharge: 2017-07-20 | Disposition: A | Discharge status: Home / Self Care | Payer: Medicare Other | Source: Ambulatory Visit | Attending: Gastroenterology | Admitting: Gastroenterology

## 2017-07-20 DIAGNOSIS — R1012 Left upper quadrant pain: Secondary | ICD-10-CM | POA: Diagnosis not present

## 2017-07-20 MED ORDER — IOPAMIDOL (ISOVUE-300) INJECTION 61%
100.0000 mL | Freq: Once | INTRAVENOUS | Status: AC | PRN
  Administered 2017-07-20: 100 mL via INTRAVENOUS

## 2017-07-23 ENCOUNTER — Ambulatory Visit (INDEPENDENT_AMBULATORY_CARE_PROVIDER_SITE_OTHER): Payer: Medicare Other | Admitting: *Deleted

## 2017-07-23 VITALS — BP 112/60 | HR 65 | Resp 18 | Ht 61.0 in | Wt 168.0 lb

## 2017-07-23 DIAGNOSIS — Z0000 Encounter for general adult medical examination without abnormal findings: Secondary | ICD-10-CM

## 2017-07-23 NOTE — Patient Instructions (Signed)
Continue doing brain stimulating activities (puzzles, reading, adult coloring books, staying active) to keep memory sharp.   Continue to eat heart healthy diet (full of fruits, vegetables, whole grains, lean protein, water--limit salt, fat, and sugar intake) and increase physical activity as tolerated.   [name] , Thank you for taking time to come for your Medicare Wellness Visit. I appreciate your ongoing commitment to your health goals. Please review the following plan we discussed and let me know if I can assist you in the future.   These are the goals we discussed: Goals    . Patient Stated     I want to exercise more, [facility]. Continue to eat healthy, enjoy life and family.       This is a list of the screening recommended for you and due dates:  Health Maintenance  Topic Date Due  .  Hepatitis C: One time screening is recommended by Center for Disease Control  (CDC) for  adults born from [birth year] through 1964.   09/14/1945  . Flu Shot  11/12/2017  . Mammogram  01/10/2019  . Colon Cancer Screening  10/26/2021  . Tetanus Vaccine  09/15/2022  . DEXA scan (bone density measurement)  Completed  . Pneumonia vaccines  Completed

## 2017-07-23 NOTE — Progress Notes (Signed)
Subjective:   Angela [name] is a 72 y.o. female who presents for Medicare Annual (Subsequent) preventive examination.  Review of Systems:  No ROS.  Medicare Wellness Visit. Additional risk factors are reflected in the social history.  Cardiac Risk Factors include: advanced age (>22men, >53 women);hypertension Sleep patterns: has frequent nighttime awakenings, gets up 1-2 times nightly to void and sleeps 6 hours nightly.  Patient reports insomnia issues, discussed recommended sleep tips.  Home Safety/Smoke Alarms: Feels safe in home. Smoke alarms in place.  Living environment; residence and Firearm Safety: 2-story house, no firearms. Lives with husband, no needs for DME, good support system Seat Belt Safety/Bike Helmet: Wears seat belt.    Objective:     Vitals: BP 112/60   Pulse 65   Resp 18   Ht 5\' 1"  (1.549 m)   Wt 168 lb (76.2 kg)   SpO2 98%   BMI 31.74 kg/m   Body mass index is 31.74 kg/m.  Advanced Directives 07/23/2017 11/10/2016 11/22/2015 05/16/2015  Does Patient Have a Medical Advance Directive? No No No No  Would patient like information on creating a medical advance directive? No - Patient declined Yes (Inpatient - patient requests chaplain consult to create a medical advance directive) Yes - Educational materials given No - patient declined information    Tobacco Social History   Tobacco Use  Smoking Status Never Smoker  Smokeless Tobacco Never Used     Counseling given: Not Answered  Past Medical History:  Diagnosis Date  . Acne rosacea   . Anxiety   . Bile salt-induced diarrhea   . Colon polyps   . Degenerative disc disease   . Diverticulitis 05/2015  . Diverticulosis 05/10/2004  . Duodenitis 04/25/2005  . Esophageal stricture 09/13/2007  . Esophagitis 04/25/2005  . External hemorrhoids 05/10/2004  . 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 Fibromyalgia   . Gastroparesis   . GERD (gastroesophageal reflux disease) 09/13/2007  .  Glaucoma   . Hiatal hernia 04/25/2005  . Hyperlipidemia   . Hypertension   . Hypothyroidism   . IBS (irritable bowel syndrome)   . Migraine   . Osteoarthritis   . Other and unspecified noninfectious gastroenteritis and colitis(558.9)   . Sessile colonic polyp 05/30/2009  . Vertigo   . Vitamin D deficiency    Past Surgical History:  Procedure Laterality Date  . BUNIONECTOMY  09/04/2011   left  . CESAREAN SECTION    . CHOLECYSTECTOMY  1985  . COLONOSCOPY    . FOOT OSTEOTOMY  2006   Right foot  . TUBAL LIGATION  1983  . VAGINAL HYSTERECTOMY  1993   Family History  Problem Relation Age of Onset  . Mitral valve prolapse Mother   . Hypertension Father   . Colon polyps Father   . Stroke Father   . Hypertension Sister        x2  . Diabetes Sister   . Colon cancer Maternal Aunt   . Stomach cancer Maternal Aunt   . Colon polyps Sister    Social History   Socioeconomic History  . Marital status: Married    Spouse name: Not on file  . Number of children: 2  . Years of education: Not on file  . Highest education level: Not on file  Occupational History  . Occupation: Retired  Engineer, production  . Financial resource strain: Not hard at all  . Food insecurity:    Worry: Never true  Inability: Never true  . Transportation needs:    Medical: No    Non-medical: No  Tobacco Use  . Smoking status: Never Smoker  . Smokeless tobacco: Never Used  Substance and Sexual Activity  . Alcohol use: No  . Drug use: No  . Sexual activity: Yes  Lifestyle  . Physical activity:    Days per week: 2 days    Minutes per session: 50 min  . Stress: Not at all  Relationships  . Social connections:    Talks on phone: More than three times a week    Gets together: More than three times a week    Attends religious service: Not on file    Active member of club or organization: Yes    Attends meetings of clubs or organizations: More than 4 times per year    Relationship status: Married  Other  Topics Concern  . Not on file  Social History Narrative  . Not on file    Outpatient Encounter Medications as of 07/23/2017  Medication Sig  . AMBULATORY NON FORMULARY MEDICATION Medication Name: FDgard samples given #32 lot number [id number]  Exp. 12/2016  gm (Patient taking differently: Take 1 tablet by mouth 2 (two) times daily. Medication Name: FDgard samples given #32 lot number [id number]  Exp. 12/2016  gm)  . aspirin 81 MG tablet Take 81 mg by mouth daily.    . celecoxib (CELEBREX) 200 MG capsule Take 1 capsule (200 mg total) daily by mouth.  . cholestyramine (QUESTRAN) 4 g packet TAKE 1 PACKET (4 G TOTAL) BY MOUTH DAILY.  . clonazePAM (KLONOPIN) 0.5 MG tablet 1 po bid as needed (Patient taking differently: Take 0.5 mg by mouth 2 (two) times daily as needed for anxiety. )  . desonide (DESOWEN) 0.05 % cream Apply topically 2 (two) times daily.  Marland Kitchen. esomeprazole (NEXIUM) 20 MG capsule Take 2 capsules 30-60 minutes before breakfast daily. (Patient taking differently: Take 20 mg by mouth 2 (two) times daily before a meal. )  . ketoconazole (NIZORAL) 2 % cream Apply 1 application topically daily as needed for irritation.  Marland Kitchen. latanoprost (XALATAN) 0.005 % ophthalmic solution Place 1 drop into both eyes at bedtime.  Marland Kitchen. levothyroxine (SYNTHROID, LEVOTHROID) 50 MCG tablet TAKE 1/2 TABLET BY MOUTH DAILY  . magnesium oxide (MAG-OX) 400 MG tablet Take 400 mg by mouth every morning.  . metoprolol tartrate (LOPRESSOR) 50 MG tablet Take 1 TABLET BY MOUTH 2 TIMES A DAY  . metroNIDAZOLE (METROCREAM) 0.75 % cream Apply 1 application topically daily.  . Probiotic Product (PROBIOTIC DAILY PO) Take 1 capsule by mouth daily.  . ranitidine (ZANTAC) 150 MG tablet Take 1 tablet by mouth daily as needed.  . traMADol (ULTRAM) 50 MG tablet Take 50 mg by mouth every 12 (twelve) hours as needed. for pain  . triamcinolone cream (KENALOG) 0.1 % Apply 1 application topically 2 (two) times daily as needed (for breakout).   .  valACYclovir (VALTREX) 500 MG tablet valacyclovir hcl 500 mg tabs   No facility-administered encounter medications on file as of 07/23/2017.     Activities of Daily Living In your present state of health, do you have any difficulty performing the following activities: 07/23/2017 11/10/2016  Hearing? N N  Vision? N N  Difficulty concentrating or making decisions? N N  Walking or climbing stairs? N N  Dressing or bathing? N N  Doing errands, shopping? N Y  Quarry managerreparing Food and eating ? N -  Using the Toilet? N -  In the past six months, have you accidently leaked urine? N -  Do you have problems with loss of bowel control? N -  Managing your Medications? N -  Managing your Finances? N -  Housekeeping or managing your Housekeeping? N -  Some recent data might be hidden    Patient Care Team: Myrlene Broker, MD as PCP - General (Internal Medicine) Amelia Jo, MD as Consulting Physician (Neurology) Jens Som Madolyn Frieze, MD as Consulting Physician (Cardiology) Rachael Fee, MD as Attending Physician (Gastroenterology) Marcene Corning, MD as Consulting Physician (Orthopedic Surgery)    Assessment:   This is a routine wellness examination for [place]. Physical assessment deferred to PCP.   Exercise Activities and Dietary recommendations Current Exercise Habits: Home exercise routine;Structured exercise class, Type of exercise: treadmill(stationary bike), Time (Minutes): 45, Frequency (Times/Week): 3, Weekly Exercise (Minutes/Week): 135, Intensity: Mild, Exercise limited by: orthopedic condition(s)  Diet (meal preparation, eat out, water intake, caffeinated beverages, dairy products, fruits and vegetables): in general, a "healthy" diet  , well balanced   Reviewed heart healthy diet, encouraged patient to increase daily water intake.  Goals    . Patient Stated     I want to exercise more, I will go to the [facility] twice a week. Continue to eat healthy, enjoy life and family.        Fall Risk Fall Risk  07/23/2017 11/22/2015 05/31/2015 05/23/2014  Falls in the past year? No No Yes No  Number falls in past yr: - - 1 -  Injury with Fall? - - No -    Depression Screen PHQ 2/9 Scores 07/23/2017 11/22/2015 05/31/2015 05/23/2014  PHQ - 2 Score 0 0 0 0  PHQ- 9 Score 2 - - -     Cognitive Function       Ad8 score reviewed for issues:  Issues making decisions: no  Less interest in hobbies / activities: no  Repeats questions, stories (family complaining): no  Trouble using ordinary gadgets (microwave, computer, phone):no  Forgets the month or year: no  Mismanaging finances: no  Remembering appts: no  Daily problems with thinking and/or memory: no Ad8 score is= 0    Immunization History  Administered Date(s) Administered  . Influenza Split 02/18/2011, 02/16/2012  . Influenza Whole 02/12/2009, 05/21/2010  . Influenza, High Dose Seasonal PF 04/04/2015, 02/07/2016, 12/19/2016  . Influenza, Seasonal, Injecte, Preservative Fre 03/29/2014  . Influenza,inj,Quad PF,6+ Mos 01/27/2013  . Influenza-Unspecified 07/14/2011, 01/12/2014  . Pneumococcal Conjugate-13 05/31/2015  . Pneumococcal Polysaccharide-23 03/29/2014  . Tdap 09/14/2012    Screening Tests Health Maintenance  Topic Date Due  . Hepatitis C Screening  [birth year]  . INFLUENZA VACCINE  11/12/2017  . MAMMOGRAM  01/10/2019  . COLONOSCOPY  10/26/2021  . TETANUS/TDAP  09/15/2022  . DEXA SCAN  Completed  . PNA vac Low Risk Adult  Completed        Plan:    Continue doing brain stimulating activities (puzzles, reading, adult coloring books, staying active) to keep memory sharp.   Continue to eat heart healthy diet (full of fruits, vegetables, whole grains, lean protein, water--limit salt, fat, and sugar intake) and increase physical activity as tolerated.  I have personally reviewed and noted the following in the patient's chart:   . Medical and social history . Use of alcohol, tobacco or  illicit drugs  . Current medications and supplements . Functional ability and status . Nutritional status . Physical activity . Advanced directives . List of other physicians . Vitals .  Screenings to include cognitive, depression, and falls . Referrals and appointments  In addition, I have reviewed and discussed with patient certain preventive protocols, quality metrics, and best practice recommendations. A written personalized care plan for preventive services as well as general preventive health recommendations were provided to patient.     Wanda Plump, RN  07/23/2017

## 2017-07-24 ENCOUNTER — Telehealth: Payer: Self-pay | Admitting: Cardiology

## 2017-07-24 NOTE — Telephone Encounter (Signed)
New Message:   Pt says she have been having a lot of palpitations this week,she wonder if she need to be seen today.

## 2017-07-24 NOTE — Telephone Encounter (Signed)
Spoke to patient. She states more frequent palpation  was concerned.   Patient states she had an appointment  Yesterday recommend contacting office  Due blood pressure 112/60 . Patient states she only take metoprolol half a tablet twice a day.  instructed patient to take an extra half tablet if needed. an continue to monitor. Patient verbalized understanding.

## 2017-08-03 NOTE — Progress Notes (Signed)
Patient ID: Angela [name], female   DOB: 1945/08/05, 72 y.o.   MRN: [id number] Medical screening examination/treatment/procedure(s) were performed by non-physician practitioner and as supervising physician I was immediately available for consultation/collaboration. I agree with above. Myrlene BrokerElizabeth A Crawford, MD

## 2017-09-17 ENCOUNTER — Other Ambulatory Visit: Payer: Self-pay | Admitting: Gastroenterology

## 2017-09-21 ENCOUNTER — Encounter: Payer: Self-pay | Admitting: Family

## 2017-09-21 ENCOUNTER — Ambulatory Visit: Payer: Self-pay | Admitting: *Deleted

## 2017-09-21 ENCOUNTER — Other Ambulatory Visit: Payer: Self-pay

## 2017-09-21 ENCOUNTER — Ambulatory Visit: Payer: Medicare Other | Admitting: Family

## 2017-09-21 ENCOUNTER — Other Ambulatory Visit (INDEPENDENT_AMBULATORY_CARE_PROVIDER_SITE_OTHER): Payer: Medicare Other

## 2017-09-21 VITALS — BP 114/70 | HR 59 | Temp 98.4°F | Ht 61.0 in | Wt 169.0 lb

## 2017-09-21 DIAGNOSIS — R350 Frequency of micturition: Secondary | ICD-10-CM | POA: Diagnosis not present

## 2017-09-21 DIAGNOSIS — N760 Acute vaginitis: Secondary | ICD-10-CM

## 2017-09-21 DIAGNOSIS — R42 Dizziness and giddiness: Secondary | ICD-10-CM

## 2017-09-21 DIAGNOSIS — M791 Myalgia, unspecified site: Secondary | ICD-10-CM

## 2017-09-21 DIAGNOSIS — N959 Unspecified menopausal and perimenopausal disorder: Secondary | ICD-10-CM | POA: Insufficient documentation

## 2017-09-21 DIAGNOSIS — B9689 Other specified bacterial agents as the cause of diseases classified elsewhere: Secondary | ICD-10-CM | POA: Insufficient documentation

## 2017-09-21 DIAGNOSIS — R87610 Atypical squamous cells of undetermined significance on cytologic smear of cervix (ASC-US): Secondary | ICD-10-CM | POA: Insufficient documentation

## 2017-09-21 LAB — COMPREHENSIVE METABOLIC PANEL
ALK PHOS: 77 U/L (ref 39–117)
ALT: 20 U/L (ref 0–35)
AST: 19 U/L (ref 0–37)
Albumin: 4 g/dL (ref 3.5–5.2)
BUN: 13 mg/dL (ref 6–23)
CO2: 28 mEq/L (ref 19–32)
Calcium: 9.5 mg/dL (ref 8.4–10.5)
Chloride: 105 mEq/L (ref 96–112)
Creatinine, Ser: 0.76 mg/dL (ref 0.40–1.20)
GFR: 96.44 mL/min (ref >60.00)
GLUCOSE: 98 mg/dL (ref 70–99)
Potassium: 3.5 mEq/L (ref 3.5–5.1)
Sodium: 141 mEq/L (ref 135–145)
TOTAL PROTEIN: 6.7 g/dL (ref 6.0–8.3)
Total Bilirubin: 0.4 mg/dL (ref 0.2–1.2)

## 2017-09-21 LAB — CBC WITH DIFFERENTIAL/PLATELET
BASOS ABS: 0 10*3/uL (ref 0.0–0.1)
Basophils Relative: 0.7 % (ref 0.0–3.0)
EOS ABS: 0.2 10*3/uL (ref 0.0–0.7)
Eosinophils Relative: 2.5 % (ref 0.0–5.0)
HCT: 35.8 % — ABNORMAL LOW (ref 36.0–46.0)
Hemoglobin: 12.5 g/dL (ref 12.0–15.0)
LYMPHS ABS: 3.3 10*3/uL (ref 0.7–4.0)
Lymphocytes Relative: 54.5 % — ABNORMAL HIGH (ref 12.0–46.0)
MCHC: 34.9 g/dL (ref 30.0–36.0)
MCV: 91.3 fl (ref 78.0–100.0)
MONO ABS: 0.6 10*3/uL (ref 0.1–1.0)
Monocytes Relative: 10.5 % (ref 3.0–12.0)
NEUTROS PCT: 31.8 % — AB (ref 43.0–77.0)
Neutro Abs: 1.9 10*3/uL (ref 1.4–7.7)
Platelets: 311 10*3/uL (ref 150.0–400.0)
RBC: 3.92 Mil/uL (ref 3.87–5.11)
RDW: 14.1 % (ref 11.5–15.5)
WBC: 6.1 10*3/uL (ref 4.0–10.5)

## 2017-09-21 LAB — TSH: TSH: 0.69 u[IU]/mL (ref 0.35–4.50)

## 2017-09-21 LAB — VITAMIN B12: Vitamin B-12: 772 pg/mL (ref 211–911)

## 2017-09-21 NOTE — Telephone Encounter (Signed)
Pt reports h/o vertigo; several episodes in mornings only, past 3 days. States mild, at times "Room spins, other times just lightheaded." States positional. Also reports mild swelling bilateral hands. Pt states B/P checked at appt recently "Good." Denies CP, SOB. States had headache last night, resolved with Ultram. Appt made for today with L. Dayton ScrapeMurray. Care advise given per protocol.  Reason for Disposition . [1] MODERATE dizziness (e.g., vertigo; feels very unsteady, interferes with normal activities) AND [2] has been evaluated by physician for this  Answer Assessment - Initial Assessment Questions 1. DESCRIPTION: "Describe your dizziness."     Room spins at times, other times not, "Just dizzy." 2. VERTIGO: "Do you feel like either you or the room is spinning or tilting?"      Sometimes 3. LIGHTHEADED: "Do you feel lightheaded?" (e.g., somewhat faint, woozy, weak upon standing)     Yes at times, in mornings. 4. SEVERITY: "How bad is it?"  "Can you walk?"   - MILD - Feels unsteady but walking normally.   - MODERATE - Feels very unsteady when walking, but not falling; interferes with normal activities (e.g., school, work) .   - SEVERE - Unable to walk without falling (requires assistance).     mild 5. ONSET:  "When did the dizziness begin?"     3 days ago 6. AGGRAVATING FACTORS: "Does anything make it worse?" (e.g., standing, change in head position)     Positional 7. CAUSE: "What do you think is causing the dizziness?"     H/O vertigo 8. RECURRENT SYMPTOM: "Have you had dizziness before?" If so, ask: "When was the last time?" "What happened that time?"     yes 9. OTHER SYMPTOMS: "Do you have any other symptoms?" (e.g., headache, weakness, numbness, vomiting, earache)     Hands swelling,mild, some numbness, resolves throughout day.  Protocols used: DIZZINESS - VERTIGO-A-AH

## 2017-09-21 NOTE — Telephone Encounter (Signed)
1:00 pm patient~

## 2017-09-21 NOTE — Progress Notes (Signed)
Angela [name] is a 72 y.o. female with the following history as recorded in EpicCare:  Patient Active Problem List   Diagnosis Date Noted  . Osteoarthritis   . IBS (irritable bowel syndrome)   . Hypertension   . Glaucoma   . Gastroparesis   . Colon polyps   . Bile salt-induced diarrhea   . Anxiety   . Acne rosacea   . Routine general medical examination at a health care facility 03/03/2017  . Abnormal CT of the head 11/10/2016  . Vertigo 11/09/2016  . Allergic rhinitis 02/07/2016  . Lichen of skin 96/28/3662  . Diverticulitis 05/16/2015  . Diarrhea 02/13/2015  . Hemorrhoid 02/13/2015  . Numbness and tingling in hands 05/15/2014  . 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 Migraine headache 09/24/2011  . GERD (gastroesophageal reflux disease) 08/16/2010  . Hyperlipidemia 12/20/2009  . Hypothyroidism 07/20/2009  . Vitamin D deficiency 07/20/2009  . Sessile colonic polyp 05/30/2009  . Esophageal stricture 09/13/2007  . Essential hypertension 05/14/2007  . ACNE ROSACEA 05/14/2007  . OSTEOARTHRITIS 05/14/2007  . CAROTID BRUIT 05/14/2007  . Fibromyalgia 03/30/2007  . Hiatal hernia 04/25/2005  . Esophagitis 04/25/2005  . Duodenitis 04/25/2005  . External hemorrhoids 05/10/2004  . Diverticulosis 05/10/2004    Current Outpatient Medications  Medication Sig Dispense Refill  . AMBULATORY NON FORMULARY MEDICATION Medication Name: FDgard samples given #32 lot number [id number]  Exp. 12/2016  gm (Patient taking differently: Take 1 tablet by mouth 2 (two) times daily. Medication Name: FDgard samples given #32 lot number [id number]  Exp. 12/2016  gm) 32 capsule 0  . aspirin 81 MG tablet Take 81 mg by mouth daily.      . celecoxib (CELEBREX) 200 MG capsule Take 1 capsule (200 mg total) daily by mouth. 30 capsule 6  . cholestyramine (QUESTRAN) 4 g packet TAKE 1 PACKET (4 G TOTAL) BY MOUTH DAILY. 30 packet 6  . desonide (DESOWEN) 0.05 % cream Apply topically 2 (two) times daily. 30 g 0  .  esomeprazole (NEXIUM) 40 MG capsule TAKE 1 CAPSULE BY MOUTH 2 TIMES DAILY BEFORE A MEAL. 60 capsule 5  . fluocinonide gel (LIDEX) 0.05 % fluocinonide 0.05 % topical gel  APPLY 4-5 APPLICATIONS DAILY AS NEEDED    . ketoconazole (NIZORAL) 2 % cream Apply 1 application topically daily as needed for irritation.    Marland Kitchen latanoprost (XALATAN) 0.005 % ophthalmic solution Place 1 drop into both eyes at bedtime.    Marland Kitchen levothyroxine (SYNTHROID, LEVOTHROID) 50 MCG tablet TAKE 1/2 TABLET BY MOUTH DAILY 30 tablet 2  . loratadine (CLARITIN) 10 MG tablet Allergy Relief (loratadine) 10 mg tablet  TAKE 1 TABLET EVERY DAY AS NEEDED FOR ALLEGIES    . magnesium oxide (MAG-OX) 400 MG tablet Take 400 mg by mouth every morning.    . metoprolol tartrate (LOPRESSOR) 50 MG tablet Take 1 TABLET BY MOUTH 2 TIMES A DAY 60 tablet 8  . metroNIDAZOLE (METROCREAM) 0.75 % cream Apply 1 application topically daily.    Marland Kitchen PARoxetine (PAXIL) 10 MG tablet Paxil 10 mg tablet  Take 1 tablet every day by oral route.    . Probiotic Product (PROBIOTIC DAILY PO) Take 1 capsule by mouth daily.    . ranitidine (ZANTAC) 150 MG tablet Take 1 tablet by mouth daily as needed.    . traMADol (ULTRAM) 50 MG tablet Take 50 mg by mouth every 12 (twelve) hours as needed. for pain  0  . triamcinolone cream (KENALOG) 0.1 % Apply  1 application topically 2 (two) times daily as needed (for breakout).     . valACYclovir (VALTREX) 500 MG tablet valacyclovir hcl 500 mg tabs     No current facility-administered medications for this visit.     Allergies: Tetracycline; Effexor xr [venlafaxine hcl]; Codeine; and Paroxetine  Past Medical History:  Diagnosis Date  . Acne rosacea   . Anxiety   . Bile salt-induced diarrhea   . Colon polyps   . Degenerative disc disease   . Diverticulitis 05/2015  . Diverticulosis 05/10/2004  . Duodenitis 04/25/2005  . Esophageal stricture 09/13/2007  . Esophagitis 04/25/2005  . External hemorrhoids 05/10/2004  . Fibromuscular  hyperplasia of artery ([facility])    of carotid artery  . Fibromyalgia   . Gastroparesis   . GERD (gastroesophageal reflux disease) 09/13/2007  . Glaucoma   . Hiatal hernia 04/25/2005  . Hyperlipidemia   . Hypertension   . Hypothyroidism   . IBS (irritable bowel syndrome)   . Migraine   . Osteoarthritis   . Other and unspecified noninfectious gastroenteritis and colitis(558.9)   . Sessile colonic polyp 05/30/2009  . Vertigo   . Vitamin D deficiency     Past Surgical History:  Procedure Laterality Date  . BUNIONECTOMY  09/04/2011   left  . CESAREAN SECTION    . CHOLECYSTECTOMY  1985  . COLONOSCOPY    . FOOT OSTEOTOMY  2006   Right foot  . TUBAL LIGATION  1983  . VAGINAL HYSTERECTOMY  1993    Family History  Problem Relation Age of Onset  . Mitral valve prolapse Mother   . Hypertension Father   . Colon polyps Father   . Stroke Father   . Hypertension Sister        x2  . Diabetes Sister   . Colon cancer Maternal Aunt   . Stomach cancer Maternal Aunt   . Colon polyps Sister     Social History   Tobacco Use  . Smoking status: Never Smoker  . Smokeless tobacco: Never Used  Substance Use Topics  . 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 Also complaining of urinary frequency x 2 weeks; admits has been having increased hot flashes recently and having to drink more water; denies any burning or frequency on urination;  Does have bilateral hand stiffness/ "feel swollen" in the am- has history of fibromyalgia; FH of RA; notes that hands do feel better after she gets moving in the morning; takes Celebrex daily;   Objective:   Vitals:   09/21/17 1314  BP: 114/70  Pulse: (!) 59  Temp: 98.4 F (36.9 C)  TempSrc: Oral  SpO2: 96%  Weight: 169 lb 0.6 oz (76.7 kg)  Height: 5' 1"  (1.549 m)    General: Well developed, well nourished, in no acute distress  Skin : Warm and dry.  Head: Normocephalic and atraumatic  Eyes: Sclera and conjunctiva clear; pupils round and reactive to light; extraocular movements intact  Ears: External normal; canals clear; tympanic membranes normal  Oropharynx: Pink, supple. No suspicious lesions  Neck: Supple without thyromegaly, adenopathy  Lungs: Respirations unlabored; clear to auscultation bilaterally without wheeze,  rales, rhonchi  CVS exam: normal rate and regular rhythm.  Musculoskeletal: No deformities; no active joint inflammation  Extremities: No edema, cyanosis, clubbing  Vessels: Symmetric bilaterally  Neurologic: Alert and oriented; speech intact; face symmetrical; moves all extremities well; CNII-XII intact without focal deficit  Assessment:  1. Dizziness   2. Myalgia   3. Urinary frequency     Plan:  Known history of BPPV- symptoms seem to have resolved in the past 48 hours; will update labs today; follow-up to be determined.   No follow-ups on file.  Orders Placed This Encounter  Procedures  . Urine Culture    Standing Status:   Future    Standing Expiration Date:   09/21/2018  . CBC w/Diff    Standing Status:   Future    Standing Expiration Date:   09/21/2018  . Comp Met (CMET)    Standing Status:   Future    Standing Expiration Date:   09/21/2018  . TSH    Standing Status:   Future    Standing Expiration Date:   09/21/2018  . Antinuclear Antib (ANA)    Standing Status:   Future    Standing Expiration Date:   09/21/2018  . Rheumatoid Factor    Standing Status:   Future    Standing Expiration Date:   09/21/2018  . B12    Standing Status:   Future    Standing Expiration Date:   09/21/2018    Requested Prescriptions    No prescriptions requested or ordered in  this encounter

## 2017-09-23 LAB — RHEUMATOID FACTOR: Rhuematoid fact SerPl-aCnc: <14 IU/mL (ref <14)

## 2017-09-23 LAB — URINE CULTURE
MICRO NUMBER: 90693503
SPECIMEN QUALITY: ADEQUATE

## 2017-09-23 LAB — ANA: Anti Nuclear Antibody(ANA): NEGATIVE

## 2017-10-13 ENCOUNTER — Other Ambulatory Visit: Payer: Self-pay | Admitting: Internal Medicine

## 2017-10-20 ENCOUNTER — Ambulatory Visit: Payer: Medicare Other | Admitting: Internal Medicine

## 2017-10-21 ENCOUNTER — Encounter: Payer: Self-pay | Admitting: Internal Medicine

## 2017-10-21 ENCOUNTER — Ambulatory Visit: Payer: Medicare Other | Admitting: Internal Medicine

## 2017-10-21 DIAGNOSIS — N393 Stress incontinence (female) (male): Secondary | ICD-10-CM

## 2017-10-21 DIAGNOSIS — M797 Fibromyalgia: Secondary | ICD-10-CM

## 2017-10-21 DIAGNOSIS — E039 Hypothyroidism, unspecified: Secondary | ICD-10-CM

## 2017-10-21 NOTE — Progress Notes (Signed)
   Subjective:    Patient ID: Angela [name], female    DOB: [birth year], 72 y.o.   MRN: [id number]  HPI The patient is a 72 YO female coming in for follow up of her thyroid (taking 1/2 pill synthroid 50 mcg daily, denies weight change, heat or cold intolerance), and her fibromyalgia (doing well overall with this, she does take otc medications for this, takes celebrex daily and wonders if this is okay, denies stomach pain, blood in stool) as well as new urinary incontinence (she is having some leaking with laughing, has to rush to the bathroom, normally is able to make it, urinating more often, denies burning pain).   Review of Systems  Constitutional: Negative.   HENT: Negative.   Eyes: Negative.   Respiratory: Negative for cough, chest tightness and shortness of breath.   Cardiovascular: Negative for chest pain, palpitations and leg swelling.  Gastrointestinal: Positive for abdominal distention. Negative for abdominal pain, constipation, diarrhea, nausea and vomiting.       Some gas pains  Musculoskeletal: Positive for myalgias.  Skin: Negative.   Neurological: Negative.   Psychiatric/Behavioral: Negative.       Objective:   Physical Exam  Constitutional: She is oriented to person, place, and time. She appears well-developed and well-nourished.  HENT:  Head: Normocephalic and atraumatic.  Eyes: EOM are normal.  Neck: Normal range of motion.  Cardiovascular: Normal rate and regular rhythm.  Pulmonary/Chest: Effort normal and breath sounds normal. No respiratory distress. She has no wheezes. She has no rales.  Abdominal: Soft. Bowel sounds are normal. She exhibits no distension. There is no tenderness. There is no rebound.  Musculoskeletal: She exhibits no edema.  Neurological: She is alert and oriented to person, place, and time. Coordination normal.  Skin: Skin is warm and dry.  Psychiatric: She has a normal mood and affect.   Vitals:   10/21/17 0851  BP: 116/70  Pulse: 74    Temp: 98.6 F (37 C)  TempSrc: Oral  SpO2: 97%  Weight: 167 lb (75.8 kg)  Height: 5\' 1"  (1.549 m)      Assessment & Plan:

## 2017-10-21 NOTE — Patient Instructions (Addendum)
We will have you stop celebrex for 1-2 weeks to see if this helps your stomach.   The kegel exercises may help your bladder. If not then please let us know as there are medicines we have that can help.    Kegel Exercises Kegel exercises help strengthen the muscles that support the rectum, vagina, small intestine, bladder, and uterus. Doing Kegel exercises can help:  Improve bladder and bowel control.  Improve sexual response.  Reduce problems and discomfort during pregnancy.  Kegel exercises involve squeezing your pelvic floor muscles, which are the same muscles you squeeze when you try to stop the flow of urine. The exercises can be done while sitting, standing, or lying down, but it is best to vary your position. Phase 1 exercises 1. Squeeze your pelvic floor muscles tight. You should feel a tight lift in your rectal area. If you are a female, you should also feel a tightness in your vaginal area. Keep your stomach, buttocks, and legs relaxed. 2. Hold the muscles tight for up to 10 seconds. 3. Relax your muscles. Repeat this exercise 50 times a day or as many times as told by your health care provider. Continue to do this exercise for at least 4-6 weeks or for as long as told by your health care provider. This information is not intended to replace advice given to you by your health care provider. Make sure you discuss any questions you have with your health care provider. Document Released: 03/17/2012 Document Revised: 11/24/2015 Document Reviewed: 02/18/2015 Elsevier Interactive Patient Education  [employer]2018 [employer] Inc[employer]

## 2017-10-23 DIAGNOSIS — N393 Stress incontinence (female) (male): Secondary | ICD-10-CM | POA: Insufficient documentation

## 2017-10-23 NOTE — Assessment & Plan Note (Signed)
Given kegel exercises as she wishes to avoid medications. If these are not helpful will try mirbetriq.

## 2017-10-23 NOTE — Assessment & Plan Note (Signed)
We talked about the risks and benefits of the celebrex. She wants to try stopping the celebrex to see if some of her gas problems are related to this. Will have her stop this for 2 weeks. Then we can adequately evaluate the true benefit in pain versus possible side effect related to her stomach. If she needs to stop can consider alternate agent.

## 2017-10-23 NOTE — Assessment & Plan Note (Signed)
Stable on 25 mcg daily thyroid medication. Will wait for physical to check labs as she has prior normal levels and no new symptoms related to thyroid.

## 2017-10-26 ENCOUNTER — Other Ambulatory Visit: Payer: Self-pay | Admitting: Internal Medicine

## 2017-11-30 ENCOUNTER — Telehealth: Payer: Self-pay | Admitting: Gastroenterology

## 2017-11-30 NOTE — Telephone Encounter (Signed)
The pt was advised to take 300 mg zantac at bedtime and to take all other medications as prescribed.  She was given anti reflux precautions and advised to keep appt as scheduled.  She is taking FD guard.

## 2017-11-30 NOTE — Telephone Encounter (Signed)
Patient states she is having bad acid reflux and nausea and would like advice until appt on 10.16.19.

## 2017-12-02 ENCOUNTER — Other Ambulatory Visit: Payer: Self-pay | Admitting: Gastroenterology

## 2017-12-24 ENCOUNTER — Other Ambulatory Visit: Payer: Self-pay | Admitting: Physician Assistant

## 2017-12-29 ENCOUNTER — Ambulatory Visit: Payer: Medicare Other | Admitting: Internal Medicine

## 2017-12-29 ENCOUNTER — Encounter: Payer: Self-pay | Admitting: Internal Medicine

## 2017-12-29 VITALS — BP 110/70 | HR 70 | Temp 98.3°F | Ht 61.0 in | Wt 168.0 lb

## 2017-12-29 DIAGNOSIS — J069 Acute upper respiratory infection, unspecified: Secondary | ICD-10-CM | POA: Insufficient documentation

## 2017-12-29 MED ORDER — PREDNISONE 20 MG PO TABS: 40.0000 mg | ORAL_TABLET | Freq: Every day | ORAL | 0 refills | Status: DC

## 2017-12-29 NOTE — Patient Instructions (Addendum)
We have sent in prednisone to take 2 pills daily for 5 days.   This is likely a virus.   Start taking claritin or zyrec over the counter for the next 1-2 weeks.

## 2017-12-29 NOTE — Progress Notes (Signed)
   Subjective:    Patient ID: Angela [name], female    DOB: 12/13/1945, 72 y.o.   MRN: [id number]  HPI The patient is a 72 YO female coming in for 4 days of cold symptoms. She is having sneezing, nose congestion, cough with grey sputum. She has mild SOB. She is fatigued. Having some poor appetite. Denies headaches or chest pains. Denies fevers or chills. Overall it is worsening. She has tried mucinex which was not helpful. She is not taking claritin recently. Some ear congestion as well.   Review of Systems  Constitutional: Positive for activity change, appetite change and fatigue. Negative for chills, fever and unexpected weight change.  HENT: Positive for congestion, postnasal drip, rhinorrhea and sinus pressure. Negative for ear discharge, ear pain, sinus pain, sneezing, sore throat, tinnitus, trouble swallowing and voice change.   Eyes: Negative.   Respiratory: Positive for cough and shortness of breath. Negative for chest tightness and wheezing.   Cardiovascular: Negative.   Gastrointestinal: Negative.   Musculoskeletal: Positive for myalgias.  Neurological: Negative.       Objective:   Physical Exam  Constitutional: She is oriented to person, place, and time. She appears well-developed and well-nourished.  HENT:  Head: Normocephalic and atraumatic.  Oropharynx with redness and clear drainage, nose with swollen turbinates, TMs normal bilaterally  Eyes: EOM are normal.  Neck: Normal range of motion. No thyromegaly present.  Cardiovascular: Normal rate and regular rhythm.  Pulmonary/Chest: Effort normal and breath sounds normal. No respiratory distress. She has no wheezes. She has no rales.  Mild rhonchi on exam  Abdominal: Soft.  Musculoskeletal: She exhibits no tenderness.  Lymphadenopathy:    She has no cervical adenopathy.  Neurological: She is alert and oriented to person, place, and time.  Skin: Skin is warm and dry.   Vitals:   12/29/17 1010  BP: 110/70  Pulse: 70    Temp: 98.3 F (36.8 C)  TempSrc: Oral  SpO2: 97%  Weight: 168 lb (76.2 kg)  Height: 5\' 1"  (1.549 m)      Assessment & Plan:

## 2017-12-29 NOTE — Assessment & Plan Note (Signed)
Rx for prednisone given SOB and lung findings. Advised to start claritin and symptom control. Antibiotics are not indicated today.

## 2017-12-30 ENCOUNTER — Telehealth: Payer: Self-pay | Admitting: Gastroenterology

## 2017-12-30 NOTE — Telephone Encounter (Signed)
Dr Christella HartiganJacobs what is your stand on this??

## 2017-12-30 NOTE — Telephone Encounter (Signed)
The pt states she will continue zantac and call back if she decides to change

## 2017-12-30 NOTE — Telephone Encounter (Signed)
I am not concerned on the initial reports about ranitidine.  If she wants to change to pepcid (generic name famotidine) to be safe that is perfectly reasonable.  It is available OTC or prescription if she prefers; 20mg  pills, one pill at bedtime nightly, disp 30 with 11 refills.  Ranitidine and famotidine are essentially equivalent.  thanks

## 2018-01-13 ENCOUNTER — Telehealth: Payer: Self-pay | Admitting: Gastroenterology

## 2018-01-13 NOTE — Telephone Encounter (Signed)
Spoke to patient she will get OTC famotidine 20 mg. She may increase to 40 mg if sx are not controlled.

## 2018-01-27 ENCOUNTER — Ambulatory Visit: Payer: Medicare Other | Admitting: Gastroenterology

## 2018-01-27 ENCOUNTER — Encounter

## 2018-01-27 ENCOUNTER — Encounter: Payer: Self-pay | Admitting: Gastroenterology

## 2018-01-27 VITALS — BP 118/60 | HR 88 | Ht 61.0 in | Wt 172.0 lb

## 2018-01-27 DIAGNOSIS — R140 Abdominal distension (gaseous): Secondary | ICD-10-CM

## 2018-01-27 DIAGNOSIS — K219 Gastro-esophageal reflux disease without esophagitis: Secondary | ICD-10-CM

## 2018-01-27 NOTE — Progress Notes (Signed)
Review of pertinent gastrointestinal problems: 1. Routine risk for colon cancer: Colonsocopy 10/2011 Dr. Jarold Motto, no polyps, he recommended repeat screening colonoscopy in 10 years 2. Extensive left sided diverticulosis, noted colonoscopy 2013; Acute sigmoid diverticulitis (confirmed on CT scan)05/2015, also WBC 13K 3. HH.EGD 10/2011 Dr. Jarold Motto 4. Chronic intermittent LUQ discomfort, bulging sensation: eventual CT scan 12/2014 showed no findings to account for the bulge, symptoms.  CT scan April 2019 showed sigmoid diverticulosis with associated muscular hypertrophy.  No diverticulitis.  Stable intra-and extrahepatic biliary dilation without masses or stones.   HPI: This is a very pleasant 72 year old woman whom I last saw 6 months ago.  Chief complaint is dyspepsia, GERD  She feels FD guard is helping with bloating and rumbling.  Rarely bothered by reflux symptoms (burning, belching) as long as she takes her Nexium 40 mg twice daily and H2 blocker at bedtime nightly  No dysphasia, she has been gaining weight lately.   ROS: complete GI ROS as described in HPI, all other review negative.  Constitutional:  No unintentional weight loss  Weight up 7 pounds since 07/2017   Past Medical History:  Diagnosis Date  . Acne rosacea   . Anxiety   . Bile salt-induced diarrhea   . Colon polyps   . Degenerative disc disease   . Diverticulitis 05/2015  . Diverticulosis 05/10/2004  . Duodenitis 04/25/2005  . Esophageal stricture 09/13/2007  . Esophagitis 04/25/2005  . External hemorrhoids 05/10/2004  . 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 Fibromyalgia   . Gastroparesis   . GERD (gastroesophageal reflux disease) 09/13/2007  . Glaucoma   . Hiatal hernia 04/25/2005  . Hyperlipidemia   . Hypertension   . Hypothyroidism   . IBS (irritable bowel syndrome)   . Migraine   . Osteoarthritis   . Other and unspecified noninfectious gastroenteritis and colitis(558.9)   .  Sessile colonic polyp 05/30/2009  . Vertigo   . Vitamin D deficiency     Past Surgical History:  Procedure Laterality Date  . BUNIONECTOMY  09/04/2011   left  . CESAREAN SECTION    . CHOLECYSTECTOMY  1985  . COLONOSCOPY    . FOOT OSTEOTOMY  2006   Right foot  . TUBAL LIGATION  1983  . VAGINAL HYSTERECTOMY  1993    Current Outpatient Medications  Medication Sig Dispense Refill  . AMBULATORY NON FORMULARY MEDICATION Medication Name: FDgard samples given #32 lot number [id number]  Exp. 12/2016  gm (Patient taking differently: Take 1 tablet by mouth 2 (two) times daily. Medication Name: FDgard samples given #32 lot number [id number]  Exp. 12/2016  gm) 32 capsule 0  . aspirin 81 MG tablet Take 81 mg by mouth daily.      . celecoxib (CELEBREX) 200 MG capsule TAKE 1 CAPSULE BY MOUTH DAILY 90 capsule 2  . desonide (DESOWEN) 0.05 % cream Apply topically 2 (two) times daily. 30 g 0  . esomeprazole (NEXIUM) 40 MG capsule TAKE 1 CAPSULE BY MOUTH 2 TIMES DAILY BEFORE A MEAL. 60 capsule 5  . fluocinonide gel (LIDEX) 0.05 % fluocinonide 0.05 % topical gel  APPLY 4-5 APPLICATIONS DAILY AS NEEDED    . ketoconazole (NIZORAL) 2 % cream Apply 1 application topically daily as needed for irritation.    Marland Kitchen latanoprost (XALATAN) 0.005 % ophthalmic solution latanoprost 0.005 % eye drops  PLACE 1 DROP IN BOTH EYES AT BEDTIME    . levothyroxine (SYNTHROID, LEVOTHROID) 50 MCG tablet TAKE 1/2 TABLET BY  MOUTH DAILY 45 tablet 1  . loratadine (CLARITIN) 10 MG tablet Allergy Relief (loratadine) 10 mg tablet  TAKE 1 TABLET EVERY DAY AS NEEDED FOR ALLEGIES    . metoprolol tartrate (LOPRESSOR) 50 MG tablet Take 1 TABLET BY MOUTH 2 TIMES A DAY 60 tablet 8  . metroNIDAZOLE (METROCREAM) 0.75 % cream Apply 1 application topically daily.    . NON FORMULARY FD Guard    . PARoxetine (PAXIL) 10 MG tablet Paxil 10 mg tablet  Take 1 tablet every day by oral route.    . Probiotic Product (PROBIOTIC DAILY PO) Take 1 capsule by mouth  daily.    . traMADol (ULTRAM) 50 MG tablet Take 50 mg by mouth every 12 (twelve) hours as needed. for pain  0  . triamcinolone cream (KENALOG) 0.1 % Apply 1 application topically 2 (two) times daily as needed (for breakout).     . valACYclovir (VALTREX) 500 MG tablet valacyclovir hcl 500 mg tabs     No current facility-administered medications for this visit.     Allergies as of 01/27/2018 - Review Complete 01/27/2018  Allergen Reaction Noted  . Tetracycline Other (See Comments)   . Effexor xr [venlafaxine hcl] Other (See Comments) 11/18/2016  . Codeine Nausea And Vomiting and Other (See Comments)   . Paroxetine Rash 03/27/2015    Family History  Problem Relation Age of Onset  . Mitral valve prolapse Mother   . Hypertension Father   . Colon polyps Father   . Stroke Father   . Hypertension Sister        x2  . Diabetes Sister   . Colon cancer Maternal Aunt   . Stomach cancer Maternal Aunt   . Colon polyps Sister   . Pancreatic cancer Neg Hx     Social History   Socioeconomic History  . Marital status: Married    Spouse name: Not on file  . Number of children: 2  . Years of education: Not on file  . Highest education level: Not on file  Occupational History  . Occupation: Retired  Engineer, production  . Financial resource strain: Not hard at all  . Food insecurity:    Worry: Never true    Inability: Never true  . Transportation needs:    Medical: No    Non-medical: No  Tobacco Use  . Smoking status: Never Smoker  . Smokeless tobacco: Never Used  Substance and Sexual Activity  . Alcohol use: No  . Drug use: No  . Sexual activity: Yes  Lifestyle  . Physical activity:    Days per week: 2 days    Minutes per session: 50 min  . Stress: Not at all  Relationships  . Social connections:    Talks on phone: More than three times a week    Gets together: More than three times a week    Attends religious service: Not on file    Active member of club or organization: Yes     Attends meetings of clubs or organizations: More than 4 times per year    Relationship status: Married  . Intimate partner violence:    Fear of current or ex partner: No    Emotionally abused: No    Physically abused: No    Forced sexual activity: No  Other Topics Concern  . Not on file  Social History Narrative  . Not on file     Physical Exam: BP 118/60   Pulse 88   Ht 5\' 1"  (1.549  m)   Wt 172 lb (78 kg)   BMI 32.50 kg/m  Constitutional: generally well-appearing Psychiatric: alert and oriented x3 Abdomen: soft, nontender, nondistended, no obvious ascites, no peritoneal signs, normal bowel sounds No peripheral edema noted in lower extremities  Assessment and plan: 72 y.o. female with chronic GERD, dyspepsia  She seems to be in a pretty good place right now on FD guard 1 pill once daily, proton pump inhibitor twice daily before meals, H2 blocker at bedtime nightly.  I see no reason for any further blood tests or imaging studies at this point.  She knows to call if she has any further questions or concerns.  Please see the "Patient Instructions" section for addition details about the plan.  Rob Bunting, MD [facility] 01/27/2018, 9:15 AM

## 2018-01-27 NOTE — Patient Instructions (Addendum)
Famotidine 20mg  pills at bedtime (generic, OTC). Stay on your nexium (20-30 min prior to breakfast meals) . [facility] has seen your for your hemorrhoid.  Thank you for entrusting me with your care and choosing [facility].  Dr Christella Hartigan

## 2018-03-23 ENCOUNTER — Other Ambulatory Visit: Payer: Self-pay | Admitting: Internal Medicine

## 2018-07-12 ENCOUNTER — Telehealth: Payer: Self-pay | Admitting: *Deleted

## 2018-07-12 NOTE — Telephone Encounter (Signed)
Called patient to inform them the nurse needs to either convert their upcoming AWV to a virtual visit or reschedule the visit out into the future due to covid-19 safety measures. A virtual visit was set up for 07/29/18.

## 2018-07-28 NOTE — Progress Notes (Deleted)
Subjective:   Angela [name] is a 73 y.o. female who presents for Medicare Annual (Subsequent) preventive examination.  I connected with patient 07/29/18 at 11:00 AM EDT by a video enabled telemedicine application and verified that I am speaking with the correct person using two identifiers. Patient stated full name and DOB. Patient gave permission to continue with virtual visit. Patient's location was at home and Nurse's location was at [place] office.   Review of Systems:  No ROS.  Medicare Wellness Visit. Additional risk factors are reflected in the social history.    Sleep patterns: {SX; SLEEP PATTERNS:18802::"feels rested on waking","does not get up to void","gets up *** times nightly to void","sleeps *** hours nightly"}.   Home Safety/Smoke Alarms: Feels safe in home. Smoke alarms in place.  Living environment; residence and Firearm Safety: {Rehab home environment / accessibility:30080::"no firearms","firearms stored safely"}. Seat Belt Safety/Bike Helmet: Wears seat belt.     Objective:     Vitals: There were no vitals taken for this visit.  There is no height or weight on file to calculate BMI.  Advanced Directives 07/23/2017 11/10/2016 11/22/2015 05/16/2015  Does Patient Have a Medical Advance Directive? No No No No  Would patient like information on creating a medical advance directive? No - Patient declined Yes (Inpatient - patient requests chaplain consult to create a medical advance directive) Yes - Educational materials given No - patient declined information    Tobacco Social History   Tobacco Use  Smoking Status Never Smoker  Smokeless Tobacco Never Used     Counseling given: Not Answered    Past Medical History:  Diagnosis Date  . Acne rosacea   . Anxiety   . Bile salt-induced diarrhea   . Colon polyps   . Degenerative disc disease   . Diverticulitis 05/2015  . Diverticulosis 05/10/2004  . Duodenitis 04/25/2005  . Esophageal stricture 09/13/2007  .  Esophagitis 04/25/2005  . External hemorrhoids 05/10/2004  . 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 Fibromyalgia   . Gastroparesis   . GERD (gastroesophageal reflux disease) 09/13/2007  . Glaucoma   . Hiatal hernia 04/25/2005  . Hyperlipidemia   . Hypertension   . Hypothyroidism   . IBS (irritable bowel syndrome)   . Migraine   . Osteoarthritis   . Other and unspecified noninfectious gastroenteritis and colitis(558.9)   . Sessile colonic polyp 05/30/2009  . Vertigo   . Vitamin D deficiency    Past Surgical History:  Procedure Laterality Date  . BUNIONECTOMY  09/04/2011   left  . CESAREAN SECTION    . CHOLECYSTECTOMY  1985  . COLONOSCOPY    . FOOT OSTEOTOMY  2006   Right foot  . TUBAL LIGATION  1983  . VAGINAL HYSTERECTOMY  1993   Family History  Problem Relation Age of Onset  . Mitral valve prolapse Mother   . Hypertension Father   . Colon polyps Father   . Stroke Father   . Hypertension Sister        x2  . Diabetes Sister   . Colon cancer Maternal Aunt   . Stomach cancer Maternal Aunt   . Colon polyps Sister   . Pancreatic cancer Neg Hx    Social History   Socioeconomic History  . Marital status: Married    Spouse name: Not on file  . Number of children: 2  . Years of education: Not on file  . Highest education level: Not on file  Occupational  History  . Occupation: Retired  Social Needs  . 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 Food insecurity:    Worry: Never true    Inability: Never true  . Transportation needs:    Medical: No    Non-medical: No  Tobacco Use  . Smoking status: Never Smoker  . Smokeless tobacco: Never Used  Substance and Sexual Activity  . Alcohol use: No  . Drug use: No  . Sexual activity: Yes  Lifestyle  . Physical activity:    Days per week: 2 days    Minutes per session: 50 min  . Stress: Not at all  Relationships  . Social connections:    Talks on phone: More than three times a week     Gets together: More than three times a week    Attends religious service: Not on file    Active member of club or organization: Yes    Attends meetings of clubs or organizations: More than 4 times per year    Relationship status: Married  Other Topics Concern  . Not on file  Social History Narrative  . Not on file    Outpatient Encounter Medications as of 07/29/2018  Medication Sig  . AMBULATORY NON FORMULARY MEDICATION Medication Name: FDgard samples given #32 lot number [id number]  Exp. 12/2016  gm (Patient taking differently: Take 1 tablet by mouth 2 (two) times daily. Medication Name: FDgard samples given #32 lot number [id number]  Exp. 12/2016  gm)  . aspirin 81 MG tablet Take 81 mg by mouth daily.    . celecoxib (CELEBREX) 200 MG capsule TAKE 1 CAPSULE BY MOUTH DAILY  . desonide (DESOWEN) 0.05 % cream Apply topically 2 (two) times daily.  Marland Kitchen esomeprazole (NEXIUM) 40 MG capsule TAKE 1 CAPSULE BY MOUTH 2 TIMES DAILY BEFORE A MEAL.  . fluocinonide gel (LIDEX) 0.05 % fluocinonide 0.05 % topical gel  APPLY 4-5 APPLICATIONS DAILY AS NEEDED  . ketoconazole (NIZORAL) 2 % cream Apply 1 application topically daily as needed for irritation.  Marland Kitchen latanoprost (XALATAN) 0.005 % ophthalmic solution latanoprost 0.005 % eye drops  PLACE 1 DROP IN BOTH EYES AT BEDTIME  . levothyroxine (SYNTHROID, LEVOTHROID) 50 MCG tablet TAKE 1/2 TABLET BY MOUTH DAILY  . loratadine (CLARITIN) 10 MG tablet Allergy Relief (loratadine) 10 mg tablet  TAKE 1 TABLET EVERY DAY AS NEEDED FOR ALLEGIES  . metoprolol tartrate (LOPRESSOR) 50 MG tablet Take 1 TABLET BY MOUTH 2 TIMES A DAY  . metroNIDAZOLE (METROCREAM) 0.75 % cream Apply 1 application topically daily.  . NON FORMULARY FD Guard  . PARoxetine (PAXIL) 10 MG tablet Paxil 10 mg tablet  Take 1 tablet every day by oral route.  . Probiotic Product (PROBIOTIC DAILY PO) Take 1 capsule by mouth daily.  . traMADol (ULTRAM) 50 MG tablet Take 50 mg by mouth every 12 (twelve) hours as  needed. for pain  . triamcinolone cream (KENALOG) 0.1 % Apply 1 application topically 2 (two) times daily as needed (for breakout).   . valACYclovir (VALTREX) 500 MG tablet valacyclovir hcl 500 mg tabs   No facility-administered encounter medications on file as of 07/29/2018.     Activities of Daily Living No flowsheet data found.  Patient Care Team: Myrlene Broker, MD as PCP - General (Internal Medicine) Amelia Jo, MD as Consulting Physician (Neurology) Jens Som Madolyn Frieze, MD as Consulting Physician (Cardiology) Rachael Fee, MD as Attending Physician (Gastroenterology) Marcene Corning, MD as Consulting Physician (Orthopedic Surgery)  Assessment:   This is a routine wellness examination for Eulah Citizenauline. Physical assessment deferred to PCP.  Exercise Activities and Dietary recommendations   Diet (meal preparation, eat out, water intake, caffeinated beverages, dairy products, fruits and vegetables): {Desc; diets:16563}     Goals    . Exercise 150 minutes per week (moderate activity)     Bike and treadmill x 2 a week Will try to add an extra day 60 min x 3 days     . Patient Stated     I want to exercise more, [facility]. Continue to eat healthy, enjoy life and family.    . Reduce sugar intake to X grams per day       Fall Risk Fall Risk  07/23/2017 11/22/2015 05/31/2015 05/23/2014  Falls in the past year? No No Yes No  Number falls in past yr: - - 1 -  Injury with Fall? - - No -    Depression Screen PHQ 2/9 Scores 07/23/2017 11/22/2015 05/31/2015 05/23/2014  PHQ - 2 Score 0 0 0 0  PHQ- 9 Score 2 - - -     Cognitive Function        Immunization History  Administered Date(s) Administered  . Influenza Split 02/18/2011, 02/16/2012  . Influenza Whole 02/12/2009, 05/21/2010  . Influenza, High Dose Seasonal PF 04/04/2015, 02/07/2016, 12/19/2016  . Influenza, Seasonal, Injecte, Preservative Fre 03/29/2014  . Influenza,inj,Quad PF,6+ Mos  01/27/2013  . Influenza-Unspecified 07/14/2011, 01/12/2014  . Pneumococcal Conjugate-13 05/31/2015  . Pneumococcal Polysaccharide-23 03/29/2014  . Tdap 09/14/2012   Screening Tests Health Maintenance  Topic Date Due  . Hepatitis C Screening  1946-01-16  . INFLUENZA VACCINE  11/13/2018  . MAMMOGRAM  01/10/2019  . COLONOSCOPY  10/26/2021  . TETANUS/TDAP  09/15/2022  . DEXA SCAN  Completed  . PNA vac Low Risk Adult  Completed      Plan:     I have personally reviewed and noted the following in the patient's chart:   . Medical and social history . Use of alcohol, tobacco or illicit drugs  . Current medications and supplements . Functional ability and status . Nutritional status . Physical activity . Advanced directives . List of other physicians . Vitals . Screenings to include cognitive, depression, and falls . Referrals and appointments  In addition, I have reviewed and discussed with patient certain preventive protocols, quality metrics, and best practice recommendations. A written personalized care plan for preventive services as well as general preventive health recommendations were provided to patient.     Wanda PlumpJill A Wendee Hata, RN  07/28/2018

## 2018-07-29 ENCOUNTER — Ambulatory Visit: Payer: Medicare Other

## 2018-07-29 NOTE — Progress Notes (Signed)
Subjective:   Angela [name] is a 73 y.o. female who presents for Medicare Annual (Subsequent) preventive examination.  I connected with patient 07/30/18 at 10:00 AM EDT by a video enabled telemedicine application and verified that I am speaking with the correct person using two identifiers. Patient stated full name and DOB. Patient gave permission to continue with virtual visit. Patient's location was at home and [facility] [facility] [place] [facility].   Review of Systems:  No ROS.  Medicare Wellness Visit. Additional risk factors are reflected in the social history.  Cardiac Risk Factors include: advanced age (>655men, 37>65 women);hypertension Sleep patterns: gets up 1 times nightly to void and sleeps 6-7 hours nightly.    Home Safety/Smoke Alarms: Feels safe in home. Smoke alarms in place.  Living environment; residence and Firearm Safety: 2-story house. Lives with husband, no needs for DME, good support system Seat Belt Safety/Bike Helmet: Wears seat belt.     Objective:     Vitals: There were no vitals taken for this visit.  There is no height or weight on file to calculate BMI.  Advanced Directives 07/30/2018 07/23/2017 11/10/2016 11/22/2015 05/16/2015  Does Patient Have a Medical Advance Directive? No No No No No  Would patient like information on creating a medical advance directive? No - Patient declined No - Patient declined Yes (Inpatient - patient requests chaplain consult to create a medical advance directive) Yes - Educational materials given No - patient declined information    Tobacco Social History   Tobacco Use  Smoking Status Never Smoker  Smokeless Tobacco Never Used     Counseling given: Not Answered  Past Medical History:  Diagnosis Date  . Acne rosacea   . Anxiety   . Bile salt-induced diarrhea   . Colon polyps   . Degenerative disc disease   . Diverticulitis 05/2015  . Diverticulosis 05/10/2004  . Duodenitis 04/25/2005  . Esophageal stricture 09/13/2007   . Esophagitis 04/25/2005  . External hemorrhoids 05/10/2004  . 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 Fibromyalgia   . Gastroparesis   . GERD (gastroesophageal reflux disease) 09/13/2007  . Glaucoma   . Hiatal hernia 04/25/2005  . Hyperlipidemia   . Hypertension   . Hypothyroidism   . IBS (irritable bowel syndrome)   . Migraine   . Osteoarthritis   . Other and unspecified noninfectious gastroenteritis and colitis(558.9)   . Sessile colonic polyp 05/30/2009  . Vertigo   . Vitamin D deficiency    Past Surgical History:  Procedure Laterality Date  . BUNIONECTOMY  09/04/2011   left  . CESAREAN SECTION    . CHOLECYSTECTOMY  1985  . COLONOSCOPY    . FOOT OSTEOTOMY  2006   Right foot  . TUBAL LIGATION  1983  . VAGINAL HYSTERECTOMY  1993   Family History  Problem Relation Age of Onset  . Mitral valve prolapse Mother   . Hypertension Father   . Colon polyps Father   . Stroke Father   . Hypertension Sister        x2  . Diabetes Sister   . Colon cancer Maternal Aunt   . Stomach cancer Maternal Aunt   . Colon polyps Sister   . Pancreatic cancer Neg Hx    Social History   Socioeconomic History  . Marital status: Married    Spouse name: Not on file  . Number of children: 2  . Years of education: Not on file  .  Highest education level: Not on file  Occupational History  . Occupation: Retired  Engineer, production  . Financial resource strain: Not hard at all  . Food insecurity:    Worry: Never true    Inability: Never true  . Transportation needs:    Medical: No    Non-medical: No  Tobacco Use  . Smoking status: Never Smoker  . Smokeless tobacco: Never Used  Substance and Sexual Activity  . Alcohol use: No  . Drug use: No  . Sexual activity: Yes  Lifestyle  . Physical activity:    Days per week: 2 days    Minutes per session: 50 min  . Stress: Not at all  Relationships  . Social connections:    Talks on phone: More than three times a week     Gets together: More than three times a week    Attends religious service: More than 4 times per year    Active member of club or organization: Yes    Attends meetings of clubs or organizations: More than 4 times per year    Relationship status: Married  Other Topics Concern  . Not on file  Social History Narrative  . Not on file    Outpatient Encounter Medications as of 07/30/2018  Medication Sig  . AMBULATORY NON FORMULARY MEDICATION Medication Name: FDgard samples given #32 lot number [id number]  Exp. 12/2016  gm (Patient taking differently: Take 1 tablet by mouth 2 (two) times daily. Medication Name: FDgard samples given #32 lot number [id number]  Exp. 12/2016  gm)  . aspirin 81 MG tablet Take 81 mg by mouth daily.    . cholestyramine (QUESTRAN) 4 g packet Take 4 g by mouth daily.  Marland Kitchen desonide (DESOWEN) 0.05 % cream Apply topically 2 (two) times daily.  Marland Kitchen esomeprazole (NEXIUM) 40 MG capsule TAKE 1 CAPSULE BY MOUTH 2 TIMES DAILY BEFORE A MEAL.  . fluocinonide gel (LIDEX) 0.05 % fluocinonide 0.05 % topical gel  APPLY 4-5 APPLICATIONS DAILY AS NEEDED  . ketoconazole (NIZORAL) 2 % cream Apply 1 application topically daily as needed for irritation.  Marland Kitchen latanoprost (XALATAN) 0.005 % ophthalmic solution latanoprost 0.005 % eye drops  PLACE 1 DROP IN BOTH EYES AT BEDTIME  . levothyroxine (SYNTHROID, LEVOTHROID) 50 MCG tablet TAKE 1/2 TABLET BY MOUTH DAILY  . loratadine (CLARITIN) 10 MG tablet Allergy Relief (loratadine) 10 mg tablet  TAKE 1 TABLET EVERY DAY AS NEEDED FOR ALLEGIES  . meloxicam (MOBIC) 15 MG tablet Take 15 mg by mouth daily.  . metoprolol tartrate (LOPRESSOR) 50 MG tablet Take 1 TABLET BY MOUTH 2 TIMES A DAY  . metroNIDAZOLE (METROCREAM) 0.75 % cream Apply 1 application topically daily.  . NON FORMULARY FD Guard  . PARoxetine (PAXIL) 10 MG tablet Paxil 10 mg tablet  Take 1 tablet every day by oral route.  . Probiotic Product (PROBIOTIC DAILY PO) Take 1 capsule by mouth daily.  .  traMADol (ULTRAM) 50 MG tablet Take 50 mg by mouth every 12 (twelve) hours as needed. for pain  . triamcinolone cream (KENALOG) 0.1 % Apply 1 application topically 2 (two) times daily as needed (for breakout).   . valACYclovir (VALTREX) 500 MG tablet valacyclovir hcl 500 mg tabs  . [DISCONTINUED] celecoxib (CELEBREX) 200 MG capsule TAKE 1 CAPSULE BY MOUTH DAILY (Patient not taking: Reported on 07/30/2018)   No facility-administered encounter medications on file as of 07/30/2018.     Activities of Daily Living In your present state of health, do you  have any difficulty performing the following activities: 07/30/2018  Hearing? N  Vision? N  Difficulty concentrating or making decisions? N  Walking or climbing stairs? N  Dressing or bathing? N  Doing errands, shopping? N  Preparing Food and eating ? N  Using the Toilet? N  In the past six months, have you accidently leaked urine? N  Do you have problems with loss of bowel control? N  Managing your Medications? N  Managing your Finances? N  Housekeeping or managing your Housekeeping? N  Some recent data might be hidden    Patient Care Team: Myrlene Broker, MD as PCP - General (Internal Medicine) Amelia Jo, MD as Consulting Physician (Neurology) Jens Som Madolyn Frieze, MD as Consulting Physician (Cardiology) Rachael Fee, MD as Attending Physician (Gastroenterology) Marcene Corning, MD as Consulting Physician (Orthopedic Surgery) Ilda Mori, MD as Attending Physician (Obstetrics and Gynecology) Janet Berlin, MD as Consulting Physician (Ophthalmology)    Assessment:   This is a routine wellness examination for Angela [name]. Physical assessment deferred to PCP.   Exercise Activities and Dietary recommendations Current Exercise Habits: Structured exercise class;Home exercise routine, Type of exercise: walking;treadmill(stationary bike), Time (Minutes): 50, Frequency (Times/Week): 2, Weekly Exercise (Minutes/Week): 100,  Intensity: Mild  Diet (meal preparation, eat out, water intake, caffeinated beverages, dairy products, fruits and vegetables): in general, a "healthy" diet  , well balanced  Reviewed heart healthy diet. Encouraged patient to maintain daily water and healthy fluid intake. Relevant patient education assigned to patient using Emmi.  Goals    . Exercise 150 minutes per week (moderate activity)     Bike and treadmill x 2 a week Will try to add an extra day 60 min x 3 days     . Patient Stated     I want to exercise more, I will go to the [facility] twice a week. Continue to eat healthy, enjoy life and family.    . Reduce sugar intake to X grams per day       Fall Risk Fall Risk  07/30/2018 07/23/2017 11/22/2015 05/31/2015 05/23/2014  Falls in the past year? 1 No No Yes No  Number falls in past yr: 0 - - 1 -  Injury with Fall? 0 - - No -  Follow up Falls prevention discussed - - - -    Depression Screen PHQ 2/9 Scores 07/30/2018 07/23/2017 11/22/2015 05/31/2015  PHQ - 2 Score 0 0 0 0  PHQ- 9 Score - 2 - -     Cognitive Function       Ad8 score reviewed for issues:  Issues making decisions: no  Less interest in hobbies / activities: no  Repeats questions, stories (family complaining): no  Trouble using ordinary gadgets (microwave, computer, phone):no  Forgets the month or year: no  Mismanaging finances: no  Remembering appts: no  Daily problems with thinking and/or memory: no Ad8 score is= 0  Immunization History  Administered Date(s) Administered  . Influenza Split 02/18/2011, 02/16/2012  . Influenza Whole 02/12/2009, 05/21/2010  . Influenza, High Dose Seasonal PF 04/04/2015, 02/07/2016, 12/19/2016  . Influenza, Seasonal, Injecte, Preservative Fre 03/29/2014  . Influenza,inj,Quad PF,6+ Mos 01/27/2013  . Influenza-Unspecified 07/14/2011, 01/12/2014  . Pneumococcal Conjugate-13 05/31/2015  . Pneumococcal Polysaccharide-23 03/29/2014  . Tdap 09/14/2012    Screening Tests  Health Maintenance  Topic Date Due  . Hepatitis C Screening  November 24, 1945  . INFLUENZA VACCINE  11/13/2018  . MAMMOGRAM  01/10/2019  . COLONOSCOPY  10/26/2021  . TETANUS/TDAP  09/15/2022  .  DEXA SCAN  Completed  . PNA vac Low Risk Adult  Completed      Plan:      Reviewed health maintenance screenings with patient today and relevant education, vaccines, and/or referrals were provided.   Continue to eat heart healthy diet (full of fruits, vegetables, whole grains, lean protein, water--limit salt, fat, and sugar intake) and increase physical activity as tolerated.  Continue doing brain stimulating activities (puzzles, reading, adult coloring books, staying active) to keep memory sharp.   I have personally reviewed and noted the following in the patient's chart:   . Medical and social history . Use of alcohol, tobacco or illicit drugs  . Current medications and supplements . Functional ability and status . Nutritional status . Physical activity . Advanced directives . List of other physicians . Vitals . Screenings to include cognitive, depression, and falls . Referrals and appointments  In addition, I have reviewed and discussed with patient certain preventive protocols, quality metrics, and best practice recommendations. A written personalized care plan for preventive services as well as general preventive health recommendations were provided to patient.     Wanda Plump, RN  07/30/2018

## 2018-07-30 ENCOUNTER — Ambulatory Visit (INDEPENDENT_AMBULATORY_CARE_PROVIDER_SITE_OTHER): Payer: Medicare Other | Admitting: *Deleted

## 2018-07-30 DIAGNOSIS — Z0000 Encounter for general adult medical examination without abnormal findings: Secondary | ICD-10-CM

## 2018-07-30 NOTE — Progress Notes (Signed)
Medical screening examination/treatment/procedure(s) were performed by non-physician practitioner and as supervising physician I was immediately available for consultation/collaboration. I agree with above. Kieffer Blatz A Tyrez Berrios, MD 

## 2018-07-30 NOTE — Patient Instructions (Addendum)
If you cannot attend class in person, you can still exercise at home. Video taped versions of [employer] classes are shown on [employer] (GTN) at 8 am and 1 pm Mondays through Fridays. You can also purchase a copy of the [employer] DVD by calling [place] (GTN) [employer]. GTN is available on [employer] channel 13 with a digital cable box and on [employer] channel 31. GTN is also available on [employer], channel 99. To view GTN, go to channel 99, press OK, select [place], then select GTN to start the channel.  Continue to eat heart healthy diet (full of fruits, vegetables, whole grains, lean protein, water--limit salt, fat, and sugar intake) and increase physical activity as tolerated.  Continue doing brain stimulating activities (puzzles, reading, adult coloring books, staying active) to keep memory sharp.    Angela [name] , Thank you for taking time to come for your Medicare Wellness Visit. I appreciate your ongoing commitment to your health goals. Please review the following plan we discussed and let me know if I can assist you in the future.   These are the goals we discussed: Goals    . Exercise 150 minutes per week (moderate activity)     Bike and treadmill x 2 a week Will try to add an extra day 60 min x 3 days     . Patient Stated     I want to exercise more, I will go to the [facility] twice a week. Continue to eat healthy, enjoy life and family.    . Patient Stated     Lose weight by monitoring how many sweets I eat and by increasing my physical activity. I will go to [place] more frequently.    . Reduce sugar intake to X grams per day       This is a list of the screening recommended for you and due dates:  Health Maintenance  Topic Date Due  .  Hepatitis C: One time screening is recommended by Center for Disease Control  (CDC) for  adults born from [birth year] through 1964.   13-Oct-1945  . Flu Shot  11/13/2018  . Mammogram  01/10/2019   . Colon Cancer Screening  10/26/2021  . Tetanus Vaccine  09/15/2022  . DEXA scan (bone density measurement)  Completed  . Pneumonia vaccines  Completed   Health Maintenance, Female Adopting a healthy lifestyle and getting preventive care can go a long way to promote health and wellness. Talk with your health care provider about what schedule of regular examinations is right for you. This is a good chance for you to check in with your provider about disease prevention and staying healthy. In between checkups, there are plenty of things you can do on your own. Experts have done a lot of research about which lifestyle changes and preventive measures are most likely to keep you healthy. Ask your health care provider for more information. Weight and diet Eat a healthy diet  Be sure to include plenty of vegetables, fruits, low-fat dairy products, and lean protein.  Do not eat a lot of foods high in solid fats, added sugars, or salt.  Get regular exercise. This is one of the most important things you can do for your health. ? Most adults should exercise for at least 150 minutes each week. The exercise should increase your heart rate and make you sweat (moderate-intensity exercise). ? Most adults should also do strengthening exercises at least twice a week. This is  in addition to the moderate-intensity exercise. Maintain a healthy weight  Body mass index (BMI) is a measurement that can be used to identify possible weight problems. It estimates body fat based on height and weight. Your health care provider can help determine your BMI and help you achieve or maintain a healthy weight.  For females 31 years of age and older: ? A BMI below 18.5 is considered underweight. ? A BMI of 18.5 to 24.9 is normal. ? A BMI of 25 to 29.9 is considered overweight. ? A BMI of 30 and above is considered obese. Watch levels of cholesterol and blood lipids  You should start having your blood tested for lipids and  cholesterol at 72 years of age, then have this test every 5 years.  You may need to have your cholesterol levels checked more often if: ? Your lipid or cholesterol levels are high. ? You are older than 72 years of age. ? You are at high risk for heart disease. Cancer screening Lung Cancer  Lung cancer screening is recommended for adults 82-96 years old who are at high risk for lung cancer because of a history of smoking.  A yearly low-dose CT scan of the lungs is recommended for people who: ? Currently smoke. ? Have quit within the past 15 years. ? Have at least a 30-pack-year history of smoking. A pack year is smoking an average of one pack of cigarettes a day for 1 year.  Yearly screening should continue until it has been 15 years since you quit.  Yearly screening should stop if you develop a health problem that would prevent you from having lung cancer treatment. Breast Cancer  Practice breast self-awareness. This means understanding how your breasts normally appear and feel.  It also means doing regular breast self-exams. Let your health care provider know about any changes, no matter how small.  If you are in your 20s or 30s, you should have a clinical breast exam (CBE) by a health care provider every 1-3 years as part of a regular health exam.  If you are 60 or older, have a CBE every year. Also consider having a breast X-ray (mammogram) every year.  If you have a family history of breast cancer, talk to your health care provider about genetic screening.  If you are at high risk for breast cancer, talk to your health care provider about having an MRI and a mammogram every year.  Breast cancer gene (BRCA) assessment is recommended for women who have family members with BRCA-related cancers. BRCA-related cancers include: ? Breast. ? Ovarian. ? Tubal. ? Peritoneal cancers.  Results of the assessment will determine the need for genetic counseling and BRCA1 and BRCA2  testing. Cervical Cancer Your health care provider may recommend that you be screened regularly for cancer of the pelvic organs (ovaries, uterus, and vagina). This screening involves a pelvic examination, including checking for microscopic changes to the surface of your cervix (Pap test). You may be encouraged to have this screening done every 3 years, beginning at age 47.  For women ages 6-65, health care providers may recommend pelvic exams and Pap testing every 3 years, or they may recommend the Pap and pelvic exam, combined with testing for human papilloma virus (HPV), every 5 years. Some types of HPV increase your risk of cervical cancer. Testing for HPV may also be done on women of any age with unclear Pap test results.  Other health care providers may not recommend any screening for  nonpregnant women who are considered low risk for pelvic cancer and who do not have symptoms. Ask your health care provider if a screening pelvic exam is right for you.  If you have had past treatment for cervical cancer or a condition that could lead to cancer, you need Pap tests and screening for cancer for at least 20 years after your treatment. If Pap tests have been discontinued, your risk factors (such as having a new sexual partner) need to be reassessed to determine if screening should resume. Some women have medical problems that increase the chance of getting cervical cancer. In these cases, your health care provider may recommend more frequent screening and Pap tests. Colorectal Cancer  This type of cancer can be detected and often prevented.  Routine colorectal cancer screening usually begins at 72 years of age and continues through 72 years of age.  Your health care provider may recommend screening at an earlier age if you have risk factors for colon cancer.  Your health care provider may also recommend using home test kits to check for hidden blood in the stool.  A small camera at the end of a  tube can be used to examine your colon directly (sigmoidoscopy or colonoscopy). This is done to check for the earliest forms of colorectal cancer.  Routine screening usually begins at age 51.  Direct examination of the colon should be repeated every 5-10 years through 72 years of age. However, you may need to be screened more often if early forms of precancerous polyps or small growths are found. Skin Cancer  Check your skin from head to toe regularly.  Tell your health care provider about any new moles or changes in moles, especially if there is a change in a mole's shape or color.  Also tell your health care provider if you have a mole that is larger than the size of a pencil eraser.  Always use sunscreen. Apply sunscreen liberally and repeatedly throughout the day.  Protect yourself by wearing long sleeves, pants, a wide-brimmed hat, and sunglasses whenever you are outside. Heart disease, diabetes, and high blood pressure  High blood pressure causes heart disease and increases the risk of stroke. High blood pressure is more likely to develop in: ? People who have blood pressure in the high end of the normal range (130-139/85-89 mm Hg). ? People who are overweight or obese. ? People who are African American.  If you are 63-58 years of age, have your blood pressure checked every 3-5 years. If you are 45 years of age or older, have your blood pressure checked every year. You should have your blood pressure measured twice-once when you are at a hospital or clinic, and once when you are not at a hospital or clinic. Record the average of the two measurements. To check your blood pressure when you are not at a hospital or clinic, you can use: ? An automated blood pressure machine at a pharmacy. ? A home blood pressure monitor.  If you are between 86 years and 75 years old, ask your health care provider if you should take aspirin to prevent strokes.  Have regular diabetes screenings. This  involves taking a blood sample to check your fasting blood sugar level. ? If you are at a normal weight and have a low risk for diabetes, have this test once every three years after 72 years of age. ? If you are overweight and have a high risk for diabetes, consider being tested at  a younger age or more often. Preventing infection Hepatitis B  If you have a higher risk for hepatitis B, you should be screened for this virus. You are considered at high risk for hepatitis B if: ? You were born in a country where hepatitis B is common. Ask your health care provider which countries are considered high risk. ? Your parents were born in a high-risk country, and you have not been immunized against hepatitis B (hepatitis B vaccine). ? You have HIV or AIDS. ? You use needles to inject street drugs. ? You live with someone who has hepatitis B. ? You have had sex with someone who has hepatitis B. ? You get hemodialysis treatment. ? You take certain medicines for conditions, including cancer, organ transplantation, and autoimmune conditions. Hepatitis C  Blood testing is recommended for: ? Everyone born from [birth year] through 1964. ? Anyone with known risk factors for hepatitis C. Sexually transmitted infections (STIs)  You should be screened for sexually transmitted infections (STIs) including gonorrhea and chlamydia if: ? You are sexually active and are younger than 72 years of age. ? You are older than 72 years of age and your health care provider tells you that you are at risk for this type of infection. ? Your sexual activity has changed since you were last screened and you are at an increased risk for chlamydia or gonorrhea. Ask your health care provider if you are at risk.  If you do not have HIV, but are at risk, it may be recommended that you take a prescription medicine daily to prevent HIV infection. This is called pre-exposure prophylaxis (PrEP). You are considered at risk if: ? You are  sexually active and do not regularly use condoms or know the HIV status of your partner(s). ? You take drugs by injection. ? You are sexually active with a partner who has HIV. Talk with your health care provider about whether you are at high risk of being infected with HIV. If you choose to begin PrEP, you should first be tested for HIV. You should then be tested every 3 months for as long as you are taking PrEP. Pregnancy  If you are premenopausal and you may become pregnant, ask your health care provider about preconception counseling.  If you may become pregnant, take 400 to 800 micrograms (mcg) of folic acid every day.  If you want to prevent pregnancy, talk to your health care provider about birth control (contraception). Osteoporosis and menopause  Osteoporosis is a disease in which the bones lose minerals and strength with aging. This can result in serious bone fractures. Your risk for osteoporosis can be identified using a bone density scan.  If you are 24 years of age or older, or if you are at risk for osteoporosis and fractures, ask your health care provider if you should be screened.  Ask your health care provider whether you should take a calcium or vitamin D supplement to lower your risk for osteoporosis.  Menopause may have certain physical symptoms and risks.  Hormone replacement therapy may reduce some of these symptoms and risks. Talk to your health care provider about whether hormone replacement therapy is right for you. Follow these instructions at home:  Schedule regular health, dental, and eye exams.  Stay current with your immunizations.  Do not use any tobacco products including cigarettes, chewing tobacco, or electronic cigarettes.  If you are pregnant, do not drink alcohol.  If you are breastfeeding, limit how much and how  often you drink alcohol.  Limit alcohol intake to no more than 1 drink per day for nonpregnant women. One drink equals 12 ounces of  beer, 5 ounces of Jarome Trull, or 1 ounces of hard liquor.  Do not use street drugs.  Do not share needles.  Ask your health care provider for help if you need support or information about quitting drugs.  Tell your health care provider if you often feel depressed.  Tell your health care provider if you have ever been abused or do not feel safe at home. This information is not intended to replace advice given to you by your health care provider. Make sure you discuss any questions you have with your health care provider. Document Released: 10/14/2010 Document Revised: 09/06/2015 Document Reviewed: 01/02/2015 Elsevier Interactive Patient Education  2019 [employer].

## 2018-07-31 IMAGING — MR MR MRA HEAD W/O CM
9 of 11 series · 32 of 48 positions shown · non-contrast
Comparison: Head CT 11/09/2016

CLINICAL DATA: Vertigo

EXAM:
MRI HEAD WITHOUT CONTRAST
MRA HEAD WITHOUT CONTRAST
TECHNIQUE: Multiplanar, multiecho pulse sequences of the brain and surrounding
structures were obtained without intravenous contrast. Angiographic
images of the head were obtained using MRA technique without
contrast.

[Series 3: DWI · axial · 3.0mm · 1.09mm/px · z∈[-80,+54]mm · 6 of 92 slices shown (1 of 4)]
[im 1/92]
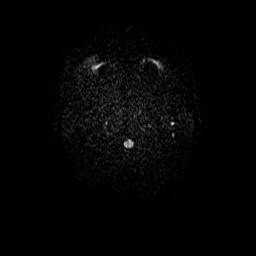
[im 19/92]
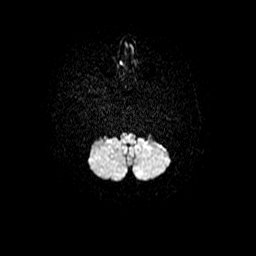
[im 37/92]
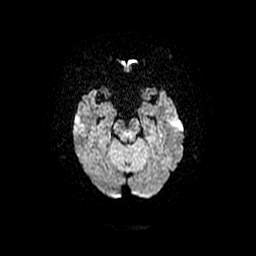
[im 55/92]
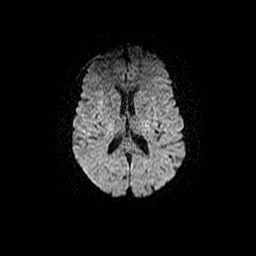
[im 73/92]
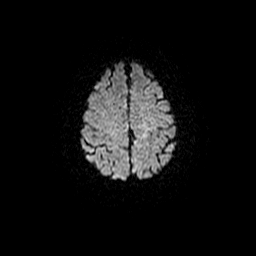
[im 92/92]
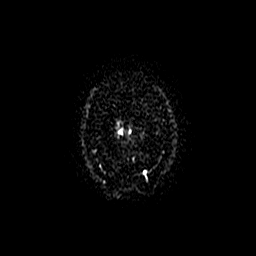

[Series 4: (id) mt fs · axial · 1.4mm · 0.45mm/px · z∈[-84,-28]mm · 5 of 136 slices shown]
[im 1/136]
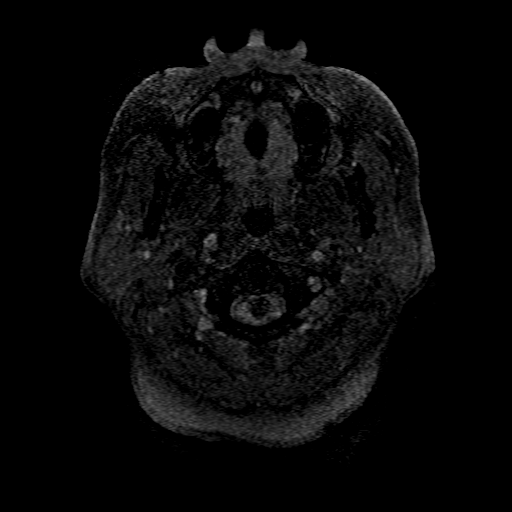
[im 28/136]
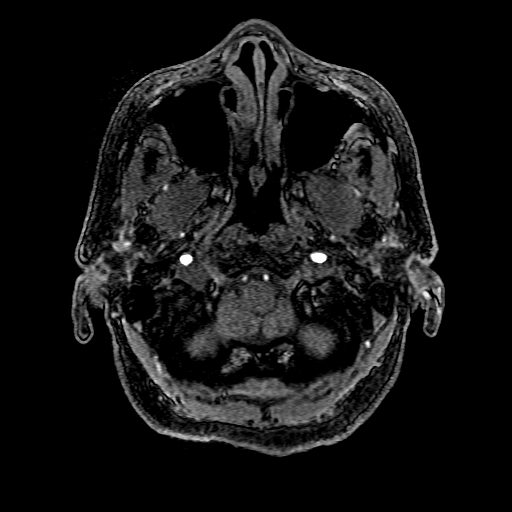
[im 41/136]
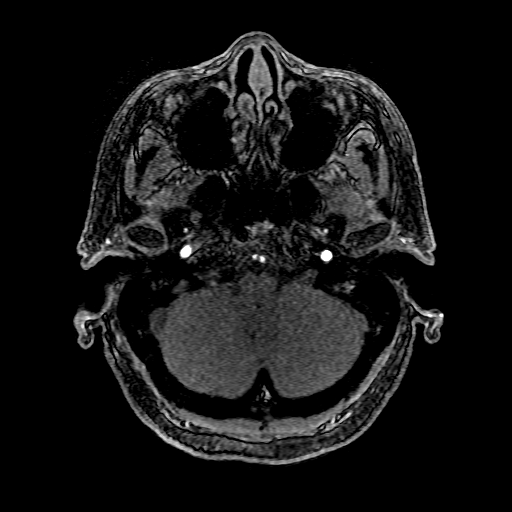
[im 55/136]
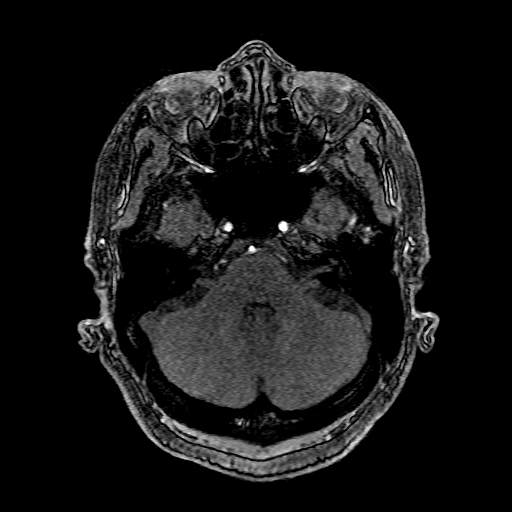
[im 82/136]
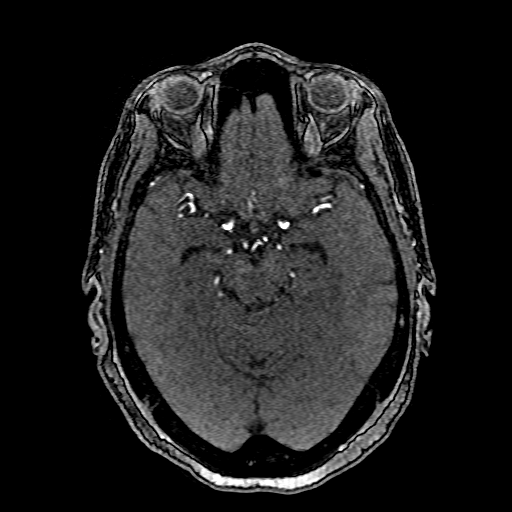

[Series 5: DWI · coronal · 5.0mm · 1.09mm/px · 6 of 74 slices shown (2 of 4)]
[im 1/74]
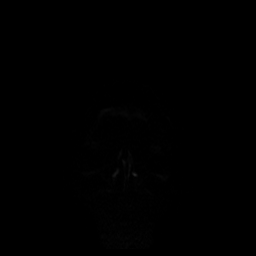
[im 15/74]
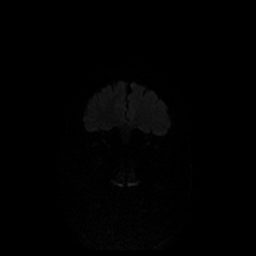
[im 30/74]
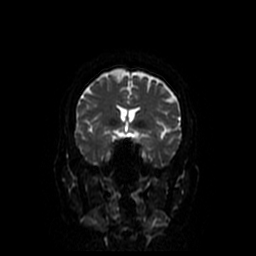
[im 44/74]
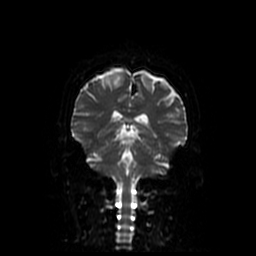
[im 59/74]
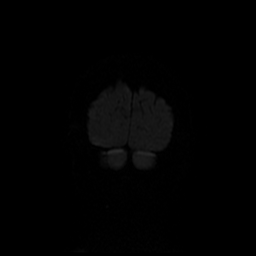
[im 74/74]
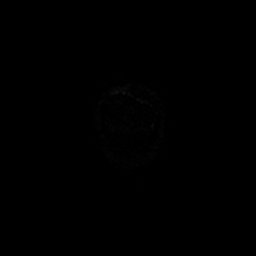

[Series 6: T1 · sagittal · 5.0mm · 0.47mm/px · 2 of 23 slices shown]
[im 1/23]
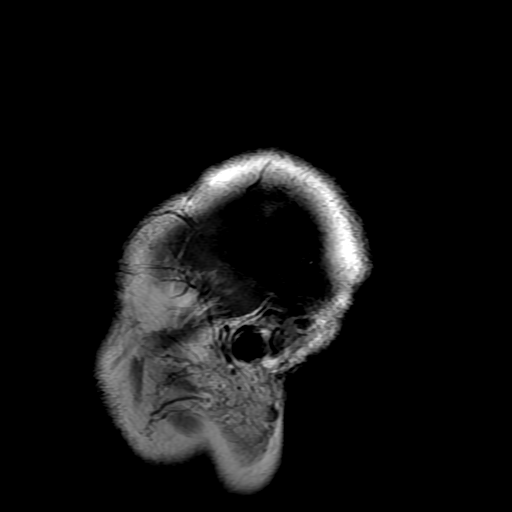
[im 23/23]
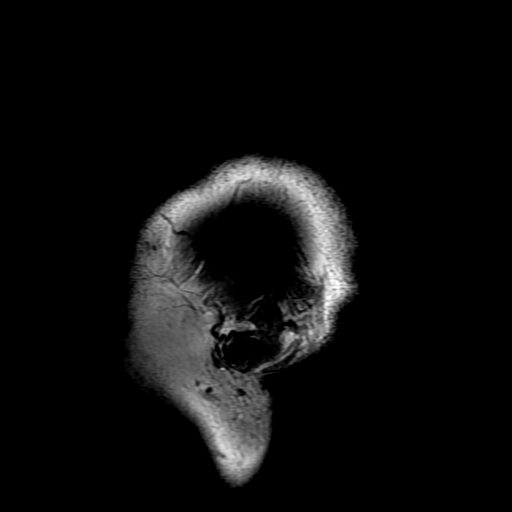

[Series 7: T2 · axial · 5.0mm · 0.47mm/px · z∈[-81,+55]mm · 2 of 24 slices shown (1 of 2)]
[im 1/24]
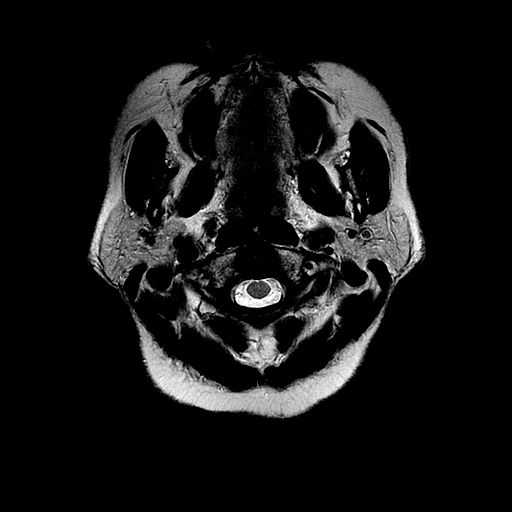
[im 24/24]
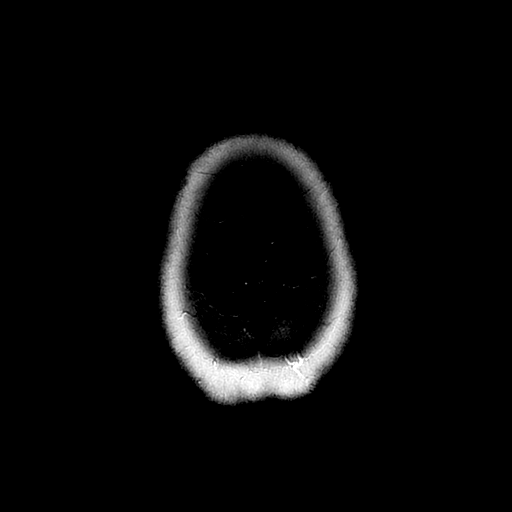

[Series 8: FLAIR · axial · 5.0mm · 0.47mm/px · z∈[-81,+55]mm · 2 of 24 slices shown]
[im 1/24]
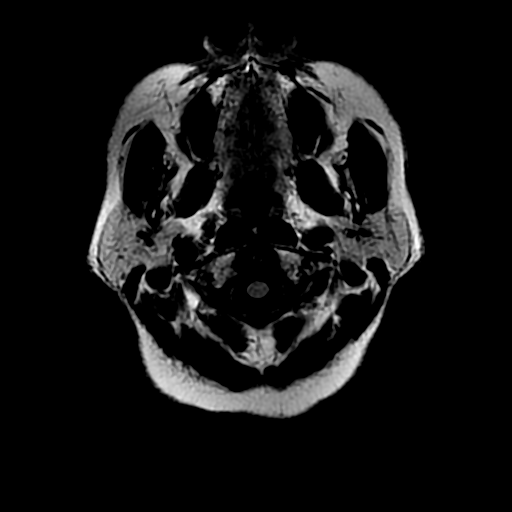
[im 24/24]
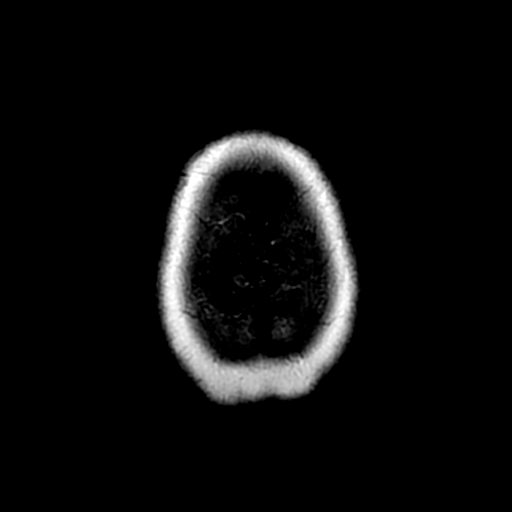

[Series 11: T2 · coronal · 5.0mm · 0.43mm/px · 2 of 26 slices shown (2 of 2)]
[im 1/26]
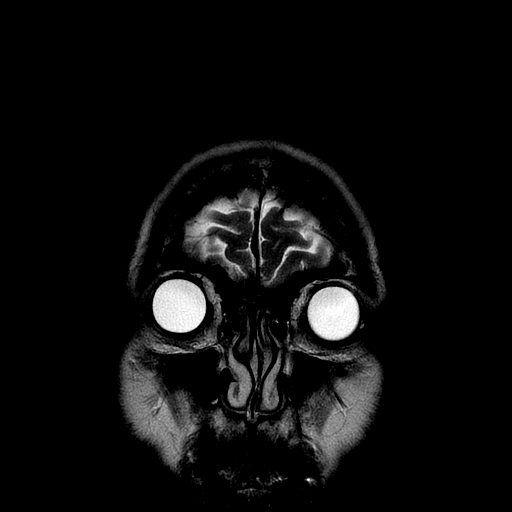
[im 26/26]
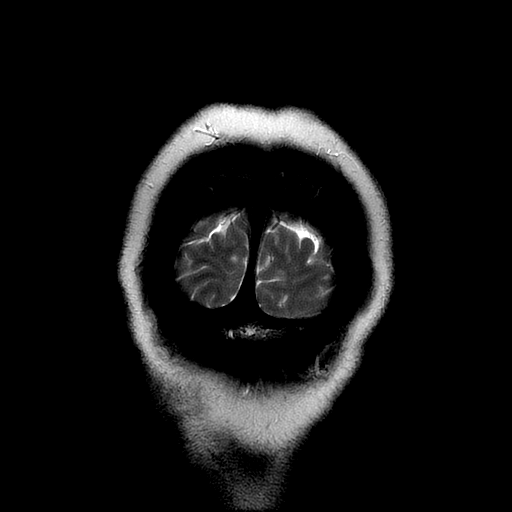

[Series 300: DWI · axial · 3.0mm · 1.09mm/px · z∈[-80,+54]mm · 4 of 46 slices shown (3 of 4)]
[im 1/46]
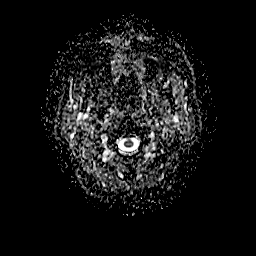
[im 16/46]
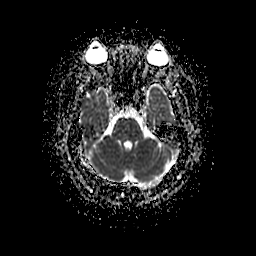
[im 31/46]
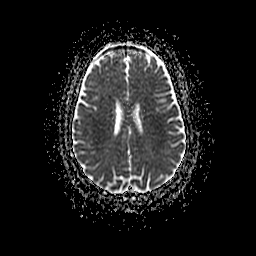
[im 46/46]
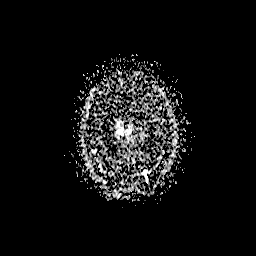

[Series 500: DWI · coronal · 5.0mm · 1.09mm/px · 3 of 37 slices shown (4 of 4)]
[im 1/37]
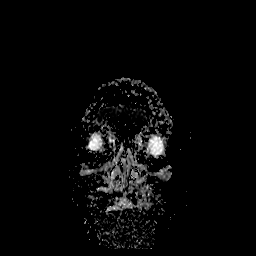
[im 19/37]
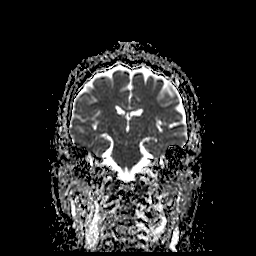
[im 37/37]
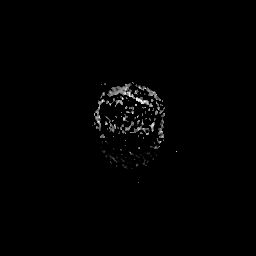

[32 of 48 positions shown; findings below may reference images not displayed]

FINDINGS: MRI HEAD FINDINGS

Brain: The midline structures are normal. No focal diffusion
restriction to indicate acute infarct. No intraparenchymal
hemorrhage. The brain parenchymal signal is normal. No mass lesion.
No chronic microhemorrhage or cerebral amyloid angiopathy. No
hydrocephalus, age advanced atrophy or lobar predominant volume
loss. No dural abnormality or extra-axial collection.

Skull and upper cervical spine: The visualized skull base,
calvarium, upper cervical spine and extracranial soft tissues are
normal.

Sinuses/Orbits: No fluid levels or advanced mucosal thickening. No
mastoid effusion. Normal orbits.

MRA HEAD FINDINGS

Intracranial internal carotid arteries: Normal.

Anterior cerebral arteries: Normal.

Middle cerebral arteries: Normal.

Posterior communicating arteries: Present bilaterally.

Posterior cerebral arteries: Fetal origin of the right PCA. Normal
left PCA.

Basilar artery: Normal.

Vertebral arteries: Left dominant. Normal.

Superior cerebellar arteries: Normal.

Anterior inferior cerebellar arteries: Normal.

Posterior inferior cerebellar arteries: Normal.
IMPRESSION: Normal MRI/MRA of the brain.

## 2018-09-07 ENCOUNTER — Other Ambulatory Visit: Payer: Self-pay | Admitting: Gastroenterology

## 2018-09-07 ENCOUNTER — Telehealth: Payer: Self-pay | Admitting: Gastroenterology

## 2018-09-07 MED ORDER — CHOLESTYRAMINE 4 G PO PACK: 4.0000 g | PACK | Freq: Every day | ORAL | 5 refills | Status: DC

## 2018-09-07 NOTE — Telephone Encounter (Signed)
Sent prescription to pharmacy. [facility] will contact [facility] when ready for pick up

## 2018-09-07 NOTE — Telephone Encounter (Signed)
Pt requested refill for cholestyramine sent to [facility].

## 2018-09-24 ENCOUNTER — Other Ambulatory Visit: Payer: Self-pay | Admitting: Internal Medicine

## 2018-10-11 ENCOUNTER — Encounter: Payer: Self-pay | Admitting: Internal Medicine

## 2018-10-11 ENCOUNTER — Ambulatory Visit (INDEPENDENT_AMBULATORY_CARE_PROVIDER_SITE_OTHER): Payer: Medicare Other | Admitting: Internal Medicine

## 2018-10-11 DIAGNOSIS — E785 Hyperlipidemia, unspecified: Secondary | ICD-10-CM | POA: Diagnosis not present

## 2018-10-11 DIAGNOSIS — I10 Essential (primary) hypertension: Secondary | ICD-10-CM | POA: Diagnosis not present

## 2018-10-11 DIAGNOSIS — K222 Esophageal obstruction: Secondary | ICD-10-CM | POA: Diagnosis not present

## 2018-10-11 DIAGNOSIS — E039 Hypothyroidism, unspecified: Secondary | ICD-10-CM

## 2018-10-11 MED ORDER — LEVOTHYROXINE SODIUM 50 MCG PO TABS: 25.0000 ug | ORAL_TABLET | Freq: Every day | ORAL | 3 refills | Status: DC

## 2018-10-11 MED ORDER — FAMOTIDINE 20 MG PO TABS: 20.0000 mg | ORAL_TABLET | Freq: Every day | ORAL | 2 refills | Status: DC

## 2018-10-11 NOTE — Progress Notes (Signed)
Virtual Visit via Video Note  I connected with Angela [name] on 10/11/18 at  2:40 PM EDT by a video enabled telemedicine application and verified that I am speaking with the correct person using two identifiers.  The patient and the provider were at separate locations throughout the entire encounter.   I discussed the limitations of evaluation and management by telemedicine and the availability of in person appointments. The patient expressed understanding and agreed to proceed.  History of Present Illness: The patient is a 73 y.o. female with visit for follow up thyroid (taking synthroid, denies side effects, denies missing doses, denies heat or cold intolerance, denies constipation or weight change) and blood pressure (BP at goal at home, denies chest pains or headaches, denies fevers or chills, taking metoprolol and having more palpitations lately, has been drinking a lot more caffeine recently, denies chest tightness or pressure, palpitations are more in the evening, not with activity) and GERD (taking zantac otc and is concerned as this has been recalled, she does not know what to take, if she misses that she gets symptoms again, denies blood in stool, denies diarrhea or constipation).  Observations/Objective: Appearance: normal, breathing appears normal, casual grooming, abdomen does not appear distended, throat normal, memory normal, mental status is A and O times 3 134/66 this morning which is slightly high for her  Assessment and Plan: See problem oriented charting  Follow Up Instructions: Checking labs, rx pepcid, refill meds  I discussed the assessment and treatment plan with the patient. The patient was provided an opportunity to ask questions and all were answered. The patient agreed with the plan and demonstrated an understanding of the instructions.   The patient was advised to call back or seek an in-person evaluation if the symptoms worsen or if the condition fails to improve as  anticipated.  Hoyt Koch, MD

## 2018-10-12 NOTE — Assessment & Plan Note (Signed)
Checking TSH and free T4 and adjust synthroid as needed. Takes 25 mcg daily.

## 2018-10-12 NOTE — Assessment & Plan Note (Signed)
Rx pepcid as she should not be off therapy and zantac has been recalled.

## 2018-10-12 NOTE — Assessment & Plan Note (Signed)
Taking metoprolol and this is keeping BP at goal. More palpitations lately likely due to increase in caffeine. She is asked to reduce this. If still having palpitations will order heart monitor.

## 2018-10-21 ENCOUNTER — Other Ambulatory Visit: Payer: Self-pay

## 2018-10-21 ENCOUNTER — Other Ambulatory Visit: Payer: Self-pay | Admitting: Cardiology

## 2018-10-22 ENCOUNTER — Telehealth: Payer: Self-pay | Admitting: Cardiology

## 2018-10-22 MED ORDER — METOPROLOL TARTRATE 50 MG PO TABS: 50.0000 mg | ORAL_TABLET | Freq: Two times a day (BID) | ORAL | 0 refills | Status: DC

## 2018-10-22 NOTE — Telephone Encounter (Signed)
Rx(s) sent to pharmacy electronically.  

## 2018-10-22 NOTE — Telephone Encounter (Signed)
New message    *STAT* If patient is at the pharmacy, call can be transferred to refill team.   1. Which medications need to be refilled? (please list name of each medication and dose if known)   metoprolol tartrate (LOPRESSOR) 50 MG tablet     2. Which pharmacy/location (including street and city if local pharmacy) is medication to be sent to?[id number] [place], [place] - [address] [place].  3. Do they need a 30 day or 90 day supply?[place]

## 2018-10-27 ENCOUNTER — Ambulatory Visit (INDEPENDENT_AMBULATORY_CARE_PROVIDER_SITE_OTHER): Payer: Medicare Other | Admitting: Gastroenterology

## 2018-10-27 DIAGNOSIS — K219 Gastro-esophageal reflux disease without esophagitis: Secondary | ICD-10-CM | POA: Diagnosis not present

## 2018-10-27 DIAGNOSIS — R140 Abdominal distension (gaseous): Secondary | ICD-10-CM

## 2018-10-27 NOTE — Patient Instructions (Addendum)
She will start gas-ex pill, one pill with every meal.  She will increase FD Guard 2 pills twice daily.  Stay on pepcid, famotidine at bedtime.  She will call with any questions or concerns.  Thank you for entrusting me with your care and choosing [facility].  Dr Ardis Hughs

## 2018-10-27 NOTE — Progress Notes (Signed)
Review of pertinent gastrointestinal problems: 1. Routine risk for colon cancer: Colonsocopy 10/2011 Dr. Jarold MottoPatterson, no polyps, he recommended repeat screening colonoscopy in 10 years 2. Extensive left sided diverticulosis, noted colonoscopy 2013; Acute sigmoid diverticulitis (confirmed on CT scan)05/2015, also WBC 13K 3. HH.EGD 10/2011 Dr. Jarold MottoPatterson 4. Chronic intermittent LUQ discomfort, bulging sensation: eventual CT scan 12/2014 showed no findings to account for the bulge, symptoms.  CT scan April 2019 showed sigmoid diverticulosis with associated muscular hypertrophy.  No diverticulitis.  Stable intra-and extrahepatic biliary dilation without masses or stones.   This service was provided via virtual visit.   Only audio was used.  The patient was located at home.  I was located in my office.  The patient did consent to this virtual visit and is aware of possible charges through their insurance for this visit.  The patient is an established patient.  My certified medical assistant, Barbaraann RondoKelly Tadros, contributed to this visit by contacting the patient by phone 1 or 2 business days prior to the appointment and also followed up on the recommendations I made after the visit.  Time spent on virtual visit: *20 minutes   HPI: This is a very pleasant 73 year old woman  I last saw her about a year ago.  She seemed to be to pretty well on FD guard 1 pill once daily, proton pump inhibitor twice daily before meals, H2 blocker at bedtime nightly.  Has been feeling very bloated. She was massaging her stomach and noticed a tender site just to the right of her umbilicus.  She only has discomfort if she pushes on it.  More sore than painful.  She still has bloating, reflux and chronic LUQ pains.   She has been gaining weight recently, probably 8 pounds in 4 months.  Eating well because she's been gaining weight (too much sodas, fast food recently).   No fevers or chills.    Her bowels are much better on  cholestyramine once a day, every morning  She switched to pepcid  She is taking 1/2 dose FD guard.  Chief complaint is right lower quadrant discomfort, bloating  ROS: complete GI ROS as described in HPI, all other review negative.  Constitutional:  No unintentional weight loss   Past Medical History:  Diagnosis Date  . Acne rosacea   . Anxiety   . Bile salt-induced diarrhea   . Colon polyps   . Degenerative disc disease   . Diverticulitis 05/2015  . Diverticulosis 05/10/2004  . Duodenitis 04/25/2005  . Esophageal stricture 09/13/2007  . Esophagitis 04/25/2005  . External hemorrhoids 05/10/2004  . 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 Fibromyalgia   . Gastroparesis   . GERD (gastroesophageal reflux disease) 09/13/2007  . Glaucoma   . Hiatal hernia 04/25/2005  . Hyperlipidemia   . Hypertension   . Hypothyroidism   . IBS (irritable bowel syndrome)   . Migraine   . Osteoarthritis   . Other and unspecified noninfectious gastroenteritis and colitis(558.9)   . Sessile colonic polyp 05/30/2009  . Vertigo   . Vitamin D deficiency     Past Surgical History:  Procedure Laterality Date  . BUNIONECTOMY  09/04/2011   left  . CESAREAN SECTION    . CHOLECYSTECTOMY  1985  . COLONOSCOPY    . FOOT OSTEOTOMY  2006   Right foot  . TUBAL LIGATION  1983  . VAGINAL HYSTERECTOMY  1993    Current Outpatient Medications  Medication Sig Dispense Refill  .  AMBULATORY NON FORMULARY MEDICATION Medication Name: FDgard samples given #32 lot number [id number]  Exp. 12/2016  gm (Patient taking differently: Take 1 tablet by mouth 2 (two) times daily. Medication Name: FDgard samples given #32 lot number [id number]  Exp. 12/2016  gm) 32 capsule 0  . aspirin 81 MG tablet Take 81 mg by mouth daily.      . cholestyramine (QUESTRAN) 4 g packet Take 1 packet (4 g total) by mouth daily. 60 each 5  . desonide (DESOWEN) 0.05 % cream Apply topically 2 (two) times daily. 30 g 0  .  esomeprazole (NEXIUM) 40 MG capsule TAKE 1 CAPSULE BY MOUTH 2 TIMES DAILY BEFORE A MEAL. 60 capsule 5  . famotidine (PEPCID) 20 MG tablet Take 1 tablet (20 mg total) by mouth at bedtime. 90 tablet 2  . fluocinonide gel (LIDEX) 0.05 % fluocinonide 0.05 % topical gel  APPLY 4-5 APPLICATIONS DAILY AS NEEDED    . ketoconazole (NIZORAL) 2 % cream Apply 1 application topically daily as needed for irritation.    Marland Kitchen latanoprost (XALATAN) 0.005 % ophthalmic solution latanoprost 0.005 % eye drops  PLACE 1 DROP IN BOTH EYES AT BEDTIME    . levothyroxine (SYNTHROID) 50 MCG tablet Take 0.5 tablets (25 mcg total) by mouth daily. 45 tablet 3  . loratadine (CLARITIN) 10 MG tablet Allergy Relief (loratadine) 10 mg tablet  TAKE 1 TABLET EVERY DAY AS NEEDED FOR ALLEGIES    . meloxicam (MOBIC) 15 MG tablet Take 15 mg by mouth daily.    . metoprolol tartrate (LOPRESSOR) 50 MG tablet Take 1 tablet (50 mg total) by mouth 2 (two) times daily. NEEDS APPOINTMENT FOR FUTURE REFILLS OR 90-DAY SUPPLY 60 tablet 0  . metroNIDAZOLE (METROCREAM) 0.75 % cream Apply 1 application topically daily.    . NON FORMULARY FD Guard    . PARoxetine (PAXIL) 10 MG tablet Paxil 10 mg tablet  Take 1 tablet every day by oral route.    . Probiotic Product (PROBIOTIC DAILY PO) Take 1 capsule by mouth daily.    . traMADol (ULTRAM) 50 MG tablet Take 50 mg by mouth every 12 (twelve) hours as needed. for pain  0  . triamcinolone cream (KENALOG) 0.1 % Apply 1 application topically 2 (two) times daily as needed (for breakout).     . valACYclovir (VALTREX) 500 MG tablet valacyclovir hcl 500 mg tabs     No current facility-administered medications for this visit.     Allergies as of 10/27/2018 - Review Complete 10/26/2018  Allergen Reaction Noted  . Tetracycline Other (See Comments)   . Effexor xr [venlafaxine hcl] Other (See Comments) 11/18/2016  . Codeine Nausea And Vomiting and Other (See Comments)   . Paroxetine Rash 03/27/2015    Family  History  Problem Relation Age of Onset  . Mitral valve prolapse Mother   . Hypertension Father   . Colon polyps Father   . Stroke Father   . Hypertension Sister        x2  . Diabetes Sister   . Colon cancer Maternal Aunt   . Stomach cancer Maternal Aunt   . Colon polyps Sister   . Pancreatic cancer Neg Hx     Social History   Socioeconomic History  . Marital status: Married    Spouse name: Not on file  . Number of children: 2  . Years of education: Not on file  . Highest education level: Not on file  Occupational History  . Occupation: Retired  Science writer  Needs  . Financial resource strain: Not hard at all  . Food insecurity    Worry: Never true    Inability: Never true  . Transportation needs    Medical: No    Non-medical: No  Tobacco Use  . Smoking status: Never Smoker  . Smokeless tobacco: Never Used  Substance and Sexual Activity  . Alcohol use: No  . Drug use: No  . Sexual activity: Yes  Lifestyle  . Physical activity    Days per week: 2 days    Minutes per session: 50 min  . Stress: Not at all  Relationships  . Social connections    Talks on phone: More than three times a week    Gets together: More than three times a week    Attends religious service: More than 4 times per year    Active member of club or organization: Yes    Attends meetings of clubs or organizations: More than 4 times per year    Relationship status: Married  . Intimate partner violence    Fear of current or ex partner: No    Emotionally abused: No    Physically abused: No    Forced sexual activity: No  Other Topics Concern  . Not on file  Social History Narrative  . Not on file     Physical Exam: Unable to perform because this was a "telemed visit" due to current Covid-19 pandemic  Assessment and plan: 73 y.o. female with bloating and right lower quadrant discomfort  She is gained 8 pounds since the quarantine started.  Some of her abdominal discomforts may be related to  the her weight gain.  She has had bloating for a long time she does not recall whether Gas-X helped or not.  She is going to retry at 1 pill of Gas-X with every meal.  She has some discomfort in her right periumbilical region that only hurts if she pushes on it.  Otherwise she does not notice it at all.  She had an open cholecystectomy in the 80s and she tells me that the scar from that is very close to where she hurts in her abdomen when she pushes.  I suspect she has some adhesions and she is irritating those when she is pushing on her belly.  I told her to try to not push on her belly especially near the scar sites unless they are causing discomfort or pain.  She knows to call if she has any further questions or concerns.  Please see the "Patient Instructions" section for addition details about the plan.  Rob Buntinganiel Kerri Kovacik, MD [facility] 10/27/2018, 2:37 PM

## 2018-11-13 ENCOUNTER — Other Ambulatory Visit: Payer: Self-pay | Admitting: Cardiology

## 2018-11-22 ENCOUNTER — Other Ambulatory Visit: Payer: Self-pay | Admitting: Cardiology

## 2018-11-22 NOTE — Telephone Encounter (Signed)
Pt overdue for 12 month f/u. Please contact pt for future appointment. Pt needing refills. 

## 2018-11-25 ENCOUNTER — Encounter: Payer: Self-pay | Admitting: Internal Medicine

## 2018-11-25 ENCOUNTER — Other Ambulatory Visit: Payer: Self-pay

## 2018-11-25 ENCOUNTER — Ambulatory Visit (INDEPENDENT_AMBULATORY_CARE_PROVIDER_SITE_OTHER): Payer: Medicare Other | Admitting: Internal Medicine

## 2018-11-25 DIAGNOSIS — Z20828 Contact with and (suspected) exposure to other viral communicable diseases: Secondary | ICD-10-CM

## 2018-11-25 DIAGNOSIS — R1110 Vomiting, unspecified: Secondary | ICD-10-CM | POA: Diagnosis not present

## 2018-11-25 DIAGNOSIS — R42 Dizziness and giddiness: Secondary | ICD-10-CM

## 2018-11-25 DIAGNOSIS — R140 Abdominal distension (gaseous): Secondary | ICD-10-CM

## 2018-11-25 DIAGNOSIS — Z20822 Contact with and (suspected) exposure to covid-19: Secondary | ICD-10-CM

## 2018-11-25 MED ORDER — ONDANSETRON HCL 4 MG PO TABS: 4.0000 mg | ORAL_TABLET | Freq: Three times a day (TID) | ORAL | 0 refills | Status: DC | PRN

## 2018-11-25 NOTE — Assessment & Plan Note (Signed)
She will get testing for covid-19 today. Advised to start claritin. Rx for zofran for symptoms. Counseled on need to quarantine and course if positive.

## 2018-11-25 NOTE — Progress Notes (Signed)
Virtual Visit via Audio Note  I connected with Angela [name] on 11/25/18 at  9:20 AM EDT by an audio-only enabled telemedicine application and verified that I am speaking with the correct person using two identifiers.  The patient and the provider were at separate locations throughout the entire encounter.   I discussed the limitations of evaluation and management by telemedicine and the availability of in person appointments. The patient expressed understanding and agreed to proceed.  History of Present Illness: The patient is a 73 y.o. female with visit for vomiting yesterday. She also has bloating and indigestion which is not new. Started yesterday. She is also feeling some lightheaded. She is having sinus pressure and pounding in her ears. The sinus problems have been going on for about 1 week or so and she was not too worried about that due to having it before. Has not been taking claritin lately. Denies any fevers or chills. Denies new muscle aches but has fibromyalgia. This is about the same as usual. Overall it is a little better. Has not eaten anything today yet. Had an egg yesterday and toast and this was thrown up. Has been drinking broth last night. Has tried nothing for symptoms except mucinex last night.   Observations/Objective: Voice strong, no coughing or dyspnea during visit, A and O times 3, temp 97.7 F yesterday  Assessment and Plan: See problem oriented charting  Follow Up Instructions: covid-19 testing, rx zofran  Visit time 12 minutes: that time was spent in direct counseling and coordination of care with the patient: counseled about as above  I discussed the assessment and treatment plan with the patient. The patient was provided an opportunity to ask questions and all were answered. The patient agreed with the plan and demonstrated an understanding of the instructions.   The patient was advised to call back or seek an in-person evaluation if the symptoms worsen or if  the condition fails to improve as anticipated.  Hoyt Koch, MD

## 2018-11-27 LAB — NOVEL CORONAVIRUS, NAA: SARS-CoV-2, NAA: NOT DETECTED

## 2018-12-16 ENCOUNTER — Other Ambulatory Visit: Payer: Self-pay | Admitting: Cardiology

## 2019-01-04 ENCOUNTER — Other Ambulatory Visit: Payer: Self-pay | Admitting: Gastroenterology

## 2019-01-04 ENCOUNTER — Telehealth: Payer: Self-pay | Admitting: Gastroenterology

## 2019-01-04 MED ORDER — ESOMEPRAZOLE MAGNESIUM 40 MG PO CPDR: DELAYED_RELEASE_CAPSULE | ORAL | 5 refills | Status: DC

## 2019-01-04 NOTE — Telephone Encounter (Signed)
Patient takes Nexium 40 mg BID. Patient notified and sent to pharmacy

## 2019-02-21 NOTE — Progress Notes (Signed)
HPI: FU palpitations. Echo Sept 2011 showed normal LV function. A CardioNet was performed and revealed sinus rhythm with PVCs. Palpitations treated with beta blocker. Stress echocardiogram April 2015 normal. 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 Stenosis not changed compared to previous. MRI/MRA July 2018 normal.  Since I last saw her  she has mild dyspnea on exertion but no orthopnea, PND, pedal edema, exertional chest pain or syncope.  Palpitations are well controlled.  Current Outpatient Medications  Medication Sig Dispense Refill  . AMBULATORY NON FORMULARY MEDICATION Medication Name: FDgard samples given #32 lot number [id number]  Exp. 12/2016  gm (Patient taking differently: Take 1 tablet by mouth 2 (two) times daily. Medication Name: FDgard samples given #32 lot number [id number]  Exp. 12/2016  gm) 32 capsule 0  . aspirin 81 MG tablet Take 81 mg by mouth daily.      . cholestyramine (QUESTRAN) 4 g packet Take 1 packet (4 g total) by mouth daily. 60 each 5  . desonide (DESOWEN) 0.05 % cream Apply topically 2 (two) times daily. 30 g 0  . esomeprazole (NEXIUM) 40 MG capsule TAKE 1 CAPSULE BY MOUTH 2 TIMES DAILY BEFORE A MEAL. 60 capsule 5  . famotidine (PEPCID) 20 MG tablet Take 1 tablet (20 mg total) by mouth at bedtime. 90 tablet 2  . fluocinonide gel (LIDEX) 0.05 % fluocinonide 0.05 % topical gel  APPLY 4-5 APPLICATIONS DAILY AS NEEDED    . ketoconazole (NIZORAL) 2 % cream Apply 1 application topically daily as needed for irritation.    Marland Kitchen latanoprost (XALATAN) 0.005 % ophthalmic solution latanoprost 0.005 % eye drops  PLACE 1 DROP IN BOTH EYES AT BEDTIME    . levothyroxine (SYNTHROID) 50 MCG tablet Take 0.5 tablets (25 mcg total) by mouth daily. 45 tablet 3  . loratadine (CLARITIN) 10 MG tablet Allergy Relief (loratadine) 10 mg tablet  TAKE 1 TABLET EVERY DAY AS NEEDED FOR ALLEGIES    . meloxicam (MOBIC) 15 MG tablet Take 15 mg by mouth daily.    . metoprolol  tartrate (LOPRESSOR) 50 MG tablet Take 1 tablet (50 mg total) by mouth 2 (two) times daily. PLEASE KEEP UPCOMING APPOINTMENT. 60 tablet 2  . metroNIDAZOLE (FLAGYL) 500 MG tablet Take 500 mg by mouth daily.    . metroNIDAZOLE (METROCREAM) 0.75 % cream Apply 1 application topically daily.    . NON FORMULARY FD Guard    . ondansetron (ZOFRAN) 4 MG tablet Take 1 tablet (4 mg total) by mouth every 8 (eight) hours as needed for nausea or vomiting. 20 tablet 0  . PARoxetine (PAXIL) 10 MG tablet Paxil 10 mg tablet  Take 1 tablet every day by oral route.    . Probiotic Product (PROBIOTIC DAILY PO) Take 1 capsule by mouth daily.    . traMADol (ULTRAM) 50 MG tablet Take 50 mg by mouth every 12 (twelve) hours as needed. for pain  0  . triamcinolone cream (KENALOG) 0.1 % Apply 1 application topically 2 (two) times daily as needed (for breakout).     . valACYclovir (VALTREX) 500 MG tablet valacyclovir hcl 500 mg tabs     No current facility-administered medications for this visit.      Past Medical History:  Diagnosis Date  . Acne rosacea   . Anxiety   . Bile salt-induced diarrhea   . Colon polyps   . Degenerative disc disease   . Diverticulitis 05/2015  . Diverticulosis 05/10/2004  . Duodenitis 04/25/2005  .  Esophageal stricture 09/13/2007  . Esophagitis 04/25/2005  . External hemorrhoids 05/10/2004  . 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 Fibromyalgia   . Gastroparesis   . GERD (gastroesophageal reflux disease) 09/13/2007  . Glaucoma   . Hiatal hernia 04/25/2005  . Hyperlipidemia   . Hypertension   . Hypothyroidism   . IBS (irritable bowel syndrome)   . Migraine   . Osteoarthritis   . Other and unspecified noninfectious gastroenteritis and colitis(558.9)   . Sessile colonic polyp 05/30/2009  . Vertigo   . Vitamin D deficiency     Past Surgical History:  Procedure Laterality Date  . BUNIONECTOMY  09/04/2011   left  . CESAREAN SECTION    . CHOLECYSTECTOMY  1985   . COLONOSCOPY    . FOOT OSTEOTOMY  2006   Right foot  . TUBAL LIGATION  1983  . VAGINAL HYSTERECTOMY  1993    Social History   Socioeconomic History  . Marital status: Married    Spouse name: Not on file  . Number of children: 2  . Years of education: Not on file  . Highest education level: Not on file  Occupational History  . Occupation: Retired  Engineer, productionocial Needs  . Financial resource strain: Not hard at all  . Food insecurity    Worry: Never true    Inability: Never true  . Transportation needs    Medical: No    Non-medical: No  Tobacco Use  . Smoking status: Never Smoker  . Smokeless tobacco: Never Used  Substance and Sexual Activity  . Alcohol use: No  . Drug use: No  . Sexual activity: Yes  Lifestyle  . Physical activity    Days per week: 2 days    Minutes per session: 50 min  . Stress: Not at all  Relationships  . Social connections    Talks on phone: More than three times a week    Gets together: More than three times a week    Attends religious service: More than 4 times per year    Active member of club or organization: Yes    Attends meetings of clubs or organizations: More than 4 times per year    Relationship status: Married  . Intimate partner violence    Fear of current or ex partner: No    Emotionally abused: No    Physically abused: No    Forced sexual activity: No  Other Topics Concern  . Not on file  Social History Narrative  . Not on file    Family History  Problem Relation Age of Onset  . Mitral valve prolapse Mother   . Hypertension Father   . Colon polyps Father   . Stroke Father   . Hypertension Sister        x2  . Diabetes Sister   . Colon cancer Maternal Aunt   . Stomach cancer Maternal Aunt   . Colon polyps Sister   . Pancreatic cancer Neg Hx     ROS: no fevers or chills, productive cough, hemoptysis, dysphasia, odynophagia, melena, hematochezia, dysuria, hematuria, rash, seizure activity, orthopnea, PND, pedal edema,  claudication. Remaining systems are negative.  Physical Exam: Well-developed well-nourished in no acute distress.  Skin is warm and dry.  HEENT is normal.  Neck is supple.  Chest is clear to auscultation with normal expansion.  Cardiovascular exam is regular rate and rhythm.  Abdominal exam nontender or distended. No masses palpated. Extremities show no edema.  neuro grossly intact  ECG-sinus rhythm at a rate of 63, nonspecific ST changes.  Personally reviewed  A/P  1 Palpitations-symptoms are well controlled.  Continue present dose of beta-blocker.  2 hypertension-blood pressure controlled.  Continue present medications and follow.  3 history of fibromuscular dysplasia-carotid arteries were followed at [facility].  Olga Millers, MD

## 2019-02-28 ENCOUNTER — Other Ambulatory Visit: Payer: Self-pay

## 2019-02-28 ENCOUNTER — Encounter: Payer: Self-pay | Admitting: Cardiology

## 2019-02-28 ENCOUNTER — Ambulatory Visit (INDEPENDENT_AMBULATORY_CARE_PROVIDER_SITE_OTHER): Payer: Medicare Other | Admitting: Cardiology

## 2019-02-28 VITALS — BP 122/70 | HR 63 | Temp 97.8°F | Ht 62.0 in | Wt 174.0 lb

## 2019-02-28 DIAGNOSIS — I773 Arterial fibromuscular dysplasia: Secondary | ICD-10-CM | POA: Diagnosis not present

## 2019-02-28 DIAGNOSIS — I10 Essential (primary) hypertension: Secondary | ICD-10-CM

## 2019-02-28 DIAGNOSIS — R002 Palpitations: Secondary | ICD-10-CM | POA: Diagnosis not present

## 2019-02-28 NOTE — Patient Instructions (Signed)

## 2019-03-09 ENCOUNTER — Other Ambulatory Visit: Payer: Self-pay | Admitting: Cardiology

## 2019-04-11 ENCOUNTER — Ambulatory Visit (INDEPENDENT_AMBULATORY_CARE_PROVIDER_SITE_OTHER): Payer: Medicare Other | Admitting: Internal Medicine

## 2019-04-11 ENCOUNTER — Encounter: Payer: Self-pay | Admitting: Internal Medicine

## 2019-04-11 DIAGNOSIS — R05 Cough: Secondary | ICD-10-CM | POA: Diagnosis not present

## 2019-04-11 DIAGNOSIS — R059 Cough, unspecified: Secondary | ICD-10-CM

## 2019-04-11 MED ORDER — AMOXICILLIN-POT CLAVULANATE 875-125 MG PO TABS: 1.0000 | ORAL_TABLET | Freq: Two times a day (BID) | ORAL | 0 refills | Status: DC

## 2019-04-11 NOTE — Progress Notes (Signed)
Virtual Visit via Audio Note  I connected with Angela [name] on 04/11/19 at  2:00 PM EST by an audio-only enabled telemedicine application and verified that I am speaking with the correct person using two identifiers.  The patient and the provider were at separate locations throughout the entire encounter.   I discussed the limitations of evaluation and management by telemedicine and the availability of in person appointments. The patient expressed understanding and agreed to proceed. The patient and the provider were the only parties present for the visit unless noted in HPI below.  History of Present Illness: The patient is a 73 y.o. female with visit for sinus problems and congestion and cough. Started about 1 week ago. Denies SOB but some wheezing sound. Snoring more than usual. Has some mild headaches. Denies new body aches. Overall it is stable to mild worsening. Has not taken claritin in awhile. Has tried nyquil which helped her to sleep.   Observations/Objective: Voice strong, no coughing or dyspnea during visit, A and O times 3  Assessment and Plan: See problem oriented charting  Follow Up Instructions: Rx augmentin and covid-19 testing  Visit time 11 minutes: that time was spent in face to face counseling and coordination of care with the patient: counseled about as above  I discussed the assessment and treatment plan with the patient. The patient was provided an opportunity to ask questions and all were answered. The patient agreed with the plan and demonstrated an understanding of the instructions.   The patient was advised to call back or seek an in-person evaluation if the symptoms worsen or if the condition fails to improve as anticipated.  Hoyt Koch, MD

## 2019-04-12 ENCOUNTER — Ambulatory Visit: Payer: Medicare Other | Attending: Internal Medicine

## 2019-04-12 DIAGNOSIS — R05 Cough: Secondary | ICD-10-CM | POA: Insufficient documentation

## 2019-04-12 DIAGNOSIS — R059 Cough, unspecified: Secondary | ICD-10-CM | POA: Insufficient documentation

## 2019-04-12 DIAGNOSIS — Z20822 Contact with and (suspected) exposure to covid-19: Secondary | ICD-10-CM

## 2019-04-12 NOTE — Assessment & Plan Note (Signed)
We talked about various etiologies. Treated for sinus infection and ruling out covid-19. Until covid-19 test returns negative she will quarantine. If positive she will quarantine for minimum 14 days after positive result.

## 2019-04-13 LAB — NOVEL CORONAVIRUS, NAA: SARS-CoV-2, NAA: NOT DETECTED

## 2019-05-04 ENCOUNTER — Ambulatory Visit: Payer: Medicare PPO | Attending: Internal Medicine

## 2019-05-04 DIAGNOSIS — Z23 Encounter for immunization: Secondary | ICD-10-CM | POA: Insufficient documentation

## 2019-05-04 NOTE — Progress Notes (Signed)
   Covid-19 Vaccination Clinic  Name:  Angela [name]    MRN: [id number] DOB: Jun 26, 1945  05/04/2019  Angela [name] was observed post Covid-19 immunization for 15 minutes without incidence. She was provided with Vaccine Information Sheet and instruction to access the V-Safe system.   Angela [name] was instructed to call 911 with any severe reactions post vaccine: Marland Kitchen Difficulty breathing  . Swelling of your face and throat  . A fast heartbeat  . A bad rash all over your body  . Dizziness and weakness    Immunizations Administered    Name Date Dose VIS Date Route   Pfizer COVID-19 Vaccine 05/04/2019 10:48 AM 0.3 mL 03/25/2019 Intramuscular   Manufacturer: [employer], [employer]   Lot: [id number]   NDC: [address]-0

## 2019-05-23 ENCOUNTER — Ambulatory Visit: Payer: Medicare PPO | Attending: Internal Medicine

## 2019-05-23 DIAGNOSIS — Z23 Encounter for immunization: Secondary | ICD-10-CM | POA: Insufficient documentation

## 2019-05-23 NOTE — Progress Notes (Signed)
   Covid-19 Vaccination Clinic  Name:  Angela [name]    MRN: [id number] DOB: 08-27-1945  05/23/2019  [name] was observed post Covid-19 immunization for 15 minutes without incidence. She was provided with Vaccine Information Sheet and instruction to access the V-Safe system.   [name] was instructed to call 911 with any severe reactions post vaccine: Marland Kitchen Difficulty breathing  . Swelling of your face and throat  . A fast heartbeat  . A bad rash all over your body  . Dizziness and weakness    Immunizations Administered    Name Date Dose VIS Date Route   Pfizer COVID-19 Vaccine 05/23/2019  4:46 PM 0.3 mL 03/25/2019 Intramuscular   Manufacturer: [employer], [employer]   Lot: [id number]   NDC: [address]-4

## 2019-06-15 DIAGNOSIS — I6523 Occlusion and stenosis of bilateral carotid arteries: Secondary | ICD-10-CM | POA: Diagnosis not present

## 2019-06-22 DIAGNOSIS — I6523 Occlusion and stenosis of bilateral carotid arteries: Secondary | ICD-10-CM | POA: Diagnosis not present

## 2019-06-22 DIAGNOSIS — R61 Generalized hyperhidrosis: Secondary | ICD-10-CM | POA: Diagnosis not present

## 2019-06-22 DIAGNOSIS — I679 Cerebrovascular disease, unspecified: Secondary | ICD-10-CM | POA: Diagnosis not present

## 2019-06-22 DIAGNOSIS — I773 Arterial fibromuscular dysplasia: Secondary | ICD-10-CM | POA: Diagnosis not present

## 2019-07-01 ENCOUNTER — Other Ambulatory Visit: Payer: Self-pay

## 2019-07-01 MED ORDER — ESOMEPRAZOLE MAGNESIUM 40 MG PO CPDR: DELAYED_RELEASE_CAPSULE | ORAL | 5 refills | Status: DC

## 2019-07-01 NOTE — Telephone Encounter (Signed)
Generic Nexium refilled as pharmacy requested. 

## 2019-07-02 ENCOUNTER — Other Ambulatory Visit: Payer: Self-pay | Admitting: Cardiology

## 2019-07-14 ENCOUNTER — Ambulatory Visit (INDEPENDENT_AMBULATORY_CARE_PROVIDER_SITE_OTHER): Payer: Medicare PPO | Admitting: Internal Medicine

## 2019-07-14 ENCOUNTER — Encounter: Payer: Self-pay | Admitting: Internal Medicine

## 2019-07-14 ENCOUNTER — Other Ambulatory Visit: Payer: Self-pay

## 2019-07-14 DIAGNOSIS — J069 Acute upper respiratory infection, unspecified: Secondary | ICD-10-CM | POA: Diagnosis not present

## 2019-07-14 MED ORDER — AZITHROMYCIN 250 MG PO TABS: ORAL_TABLET | ORAL | 0 refills | Status: DC

## 2019-07-14 NOTE — Assessment & Plan Note (Signed)
It is likely a bacterial infection.  Will treat with Z-Pak.  If not better -  see Dr. Okey Dupre in person.  May need a chest x-ray. Use OTC meds for symptoms

## 2019-07-14 NOTE — Progress Notes (Signed)
Virtual Visit via Video Note  I connected with Angela [name] on 07/14/19 at  4:00 PM EDT by a video enabled telemedicine application and verified that I am speaking with the correct person using two identifiers.   I discussed the limitations of evaluation and management by telemedicine and the availability of in person appointments. The patient expressed understanding and agreed to proceed.  History of Present Illness: The patient is complaining of sinus congestion, productive cough, occasional wheezing, ringing in the ear and pressure in the ears of 2 to 3 weeks duration  There has been no shortness of breath, abdominal pain, diarrhea, constipation, arthralgias, skin rashes.   Observations/Objective: The patient appears to be in no acute distress, looks well.  Assessment and Plan:  See my Assessment and Plan. Follow Up Instructions:    I discussed the assessment and treatment plan with the patient. The patient was provided an opportunity to ask questions and all were answered. The patient agreed with the plan and demonstrated an understanding of the instructions.   The patient was advised to call back or seek an in-person evaluation if the symptoms worsen or if the condition fails to improve as anticipated.  I provided face-to-face time during this encounter. We were at different locations.   Sonda Primes, MD

## 2019-07-18 ENCOUNTER — Telehealth: Payer: Self-pay

## 2019-07-18 NOTE — Telephone Encounter (Signed)
New message   Seen Dr. Posey Rea as virtual visit on 4.1.21  Asking for a call back    1.Medication Requested:azithromycin (ZITHROMAX Z-PAK) 250 MG tablet  2. Pharmacy (Name, Street, City):[id number] - [place],  - [address]  3. On Med List: Yes   4. Last Visit with PCP: 4.1.21   5. Next visit date with HJS:[id number] visit on 4.19.21    Agent: Please be advised that RX refills may take up to 3 business days. We ask that you follow-up with your pharmacy.

## 2019-07-20 NOTE — Telephone Encounter (Signed)
Called pt back she states [facility] states they did not received script. Inform pt will contact pharmacy per chart rx was received 07/14/19 @ 4:22pm. Called [facility] spoke w/Marilyn she states they did receive, but they have on file that the pt is allergic to Macrobid which is Family w/Azithromycin. They are needed permission to fill. Pls advise if ok to fill.Marland KitchenRaechel Chute

## 2019-07-20 NOTE — Telephone Encounter (Signed)
Gave msg to Dr. Posey Rea. He states pt is ok to take. Called [facility] spoke w/ Revonda Standard inforn her MD states ok to fill. Notified pt she should be able to pick up today.Angela KitchenRaechel Chute

## 2019-07-20 NOTE — Telephone Encounter (Signed)
F/U    The patient call back on Rx.Marland Kitchen

## 2019-07-21 DIAGNOSIS — Z833 Family history of diabetes mellitus: Secondary | ICD-10-CM | POA: Diagnosis not present

## 2019-07-21 DIAGNOSIS — I10 Essential (primary) hypertension: Secondary | ICD-10-CM | POA: Diagnosis not present

## 2019-07-21 DIAGNOSIS — I499 Cardiac arrhythmia, unspecified: Secondary | ICD-10-CM | POA: Diagnosis not present

## 2019-07-21 DIAGNOSIS — E669 Obesity, unspecified: Secondary | ICD-10-CM | POA: Diagnosis not present

## 2019-07-21 DIAGNOSIS — Z825 Family history of asthma and other chronic lower respiratory diseases: Secondary | ICD-10-CM | POA: Diagnosis not present

## 2019-07-21 DIAGNOSIS — L439 Lichen planus, unspecified: Secondary | ICD-10-CM | POA: Diagnosis not present

## 2019-07-21 DIAGNOSIS — Z87892 Personal history of anaphylaxis: Secondary | ICD-10-CM | POA: Diagnosis not present

## 2019-07-21 DIAGNOSIS — E049 Nontoxic goiter, unspecified: Secondary | ICD-10-CM | POA: Diagnosis not present

## 2019-07-21 DIAGNOSIS — Z7722 Contact with and (suspected) exposure to environmental tobacco smoke (acute) (chronic): Secondary | ICD-10-CM | POA: Diagnosis not present

## 2019-07-21 DIAGNOSIS — E039 Hypothyroidism, unspecified: Secondary | ICD-10-CM | POA: Diagnosis not present

## 2019-07-21 DIAGNOSIS — J329 Chronic sinusitis, unspecified: Secondary | ICD-10-CM | POA: Diagnosis not present

## 2019-07-21 DIAGNOSIS — Z8249 Family history of ischemic heart disease and other diseases of the circulatory system: Secondary | ICD-10-CM | POA: Diagnosis not present

## 2019-07-21 DIAGNOSIS — L719 Rosacea, unspecified: Secondary | ICD-10-CM | POA: Diagnosis not present

## 2019-07-21 DIAGNOSIS — Z791 Long term (current) use of non-steroidal anti-inflammatories (NSAID): Secondary | ICD-10-CM | POA: Diagnosis not present

## 2019-07-21 DIAGNOSIS — K589 Irritable bowel syndrome without diarrhea: Secondary | ICD-10-CM | POA: Diagnosis not present

## 2019-07-21 DIAGNOSIS — Z881 Allergy status to other antibiotic agents status: Secondary | ICD-10-CM | POA: Diagnosis not present

## 2019-07-21 DIAGNOSIS — H409 Unspecified glaucoma: Secondary | ICD-10-CM | POA: Diagnosis not present

## 2019-07-21 DIAGNOSIS — L309 Dermatitis, unspecified: Secondary | ICD-10-CM | POA: Diagnosis not present

## 2019-07-21 DIAGNOSIS — M1990 Unspecified osteoarthritis, unspecified site: Secondary | ICD-10-CM | POA: Diagnosis not present

## 2019-07-21 DIAGNOSIS — R232 Flushing: Secondary | ICD-10-CM | POA: Diagnosis not present

## 2019-07-21 DIAGNOSIS — M792 Neuralgia and neuritis, unspecified: Secondary | ICD-10-CM | POA: Diagnosis not present

## 2019-07-21 DIAGNOSIS — G8929 Other chronic pain: Secondary | ICD-10-CM | POA: Diagnosis not present

## 2019-08-01 ENCOUNTER — Telehealth: Payer: Self-pay

## 2019-08-01 ENCOUNTER — Ambulatory Visit (INDEPENDENT_AMBULATORY_CARE_PROVIDER_SITE_OTHER): Payer: Medicare PPO

## 2019-08-01 ENCOUNTER — Other Ambulatory Visit: Payer: Self-pay

## 2019-08-01 VITALS — BP 120/70 | HR 60 | Temp 98.3°F | Resp 16 | Ht 62.0 in | Wt 177.0 lb

## 2019-08-01 DIAGNOSIS — Z0000 Encounter for general adult medical examination without abnormal findings: Secondary | ICD-10-CM | POA: Diagnosis not present

## 2019-08-01 NOTE — Telephone Encounter (Signed)
Patients insurance is not going to cover any future refills of Esomeprazole 40mg .  Please submit another similar medication to the pharmacy so insurance can cover.

## 2019-08-01 NOTE — Telephone Encounter (Signed)
It looks like GI recently prescribed this so they should decide on alternative.

## 2019-08-01 NOTE — Progress Notes (Signed)
Subjective:   Angela [name] is a 74 y.o. female who presents for Medicare Annual (Subsequent) preventive examination.  Review of Systems:  No ROS. Medicare Wellness Visit Cardiac Risk Factors include: advanced age (>76men, >71 women);dyslipidemia;hypertension;obesity (BMI >30kg/m2);family history of premature cardiovascular disease  Sleep Patterns: No issues with falling sleep; feels rested on waking after 6 hours of sleep; gets up 1-2 times to void. Home Safety/Smoke Alarms: Feels safe in home; Smoke alarms in place. Living environment: Lives in a 2-story home with her husband; No need for DME; has good support system. Seat Belt Safety/Bike Helmet: Wears seat belt.     Objective:     Vitals: BP 120/70 (BP Location: Right Arm, Patient Position: Sitting, Cuff Size: Normal)   Pulse 60   Temp 98.3 F (36.8 C)   Resp 16   Ht 5\' 2"  (1.575 m)   Wt 177 lb (80.3 kg)   SpO2 97%   BMI 32.37 kg/m   Body mass index is 32.37 kg/m.  Advanced Directives 08/01/2019 07/30/2018 07/23/2017 11/10/2016 11/22/2015 05/16/2015  Does Patient Have a Medical Advance Directive? No No No No No No  Would patient like information on creating a medical advance directive? Yes (ED - Information included in AVS) No - Patient declined No - Patient declined Yes (Inpatient - patient requests chaplain consult to create a medical advance directive) Yes - Educational materials given No - patient declined information    Tobacco Social History   Tobacco Use  Smoking Status Never Smoker  Smokeless Tobacco Never Used     Counseling given: No   Clinical Intake:  Pre-visit preparation completed: Yes  Pain : 0-10 Pain Score: 5  Pain Type: Chronic pain Pain Location: Back Pain Orientation: Lower Pain Descriptors / Indicators: Constant, Sharp Pain Onset: More than a month ago Pain Frequency: Intermittent Pain Relieving Factors: Muscle relaxer  Pain Relieving Factors: Muscle relaxer  BMI - recorded:  32.4 Nutritional Status: BMI > 30  Obese Nutritional Risks: None Diabetes: No  How often do you need to have someone help you when you read instructions, pamphlets, or other written materials from your doctor or pharmacy?: 1 - Never What is the last grade level you completed in school?: Master Degree in Social Work  Interpreter Needed?: No  Information entered by :: [employer]. Lowell [employer]uitar, LPN  Past Medical History:  Diagnosis Date  . Acne rosacea   . Anxiety   . Bile salt-induced diarrhea   . Colon polyps   . Degenerative disc disease   . Diverticulitis 05/2015  . Diverticulosis 05/10/2004  . Duodenitis 04/25/2005  . Esophageal stricture 09/13/2007  . Esophagitis 04/25/2005  . External hemorrhoids 05/10/2004  . 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 Fibromyalgia   . Gastroparesis   . GERD (gastroesophageal reflux disease) 09/13/2007  . Glaucoma   . Hiatal hernia 04/25/2005  . Hyperlipidemia   . Hypertension   . Hypothyroidism   . IBS (irritable bowel syndrome)   . Migraine   . Osteoarthritis   . Other and unspecified noninfectious gastroenteritis and colitis(558.9)   . Sessile colonic polyp 05/30/2009  . Vertigo   . Vitamin D deficiency    Past Surgical History:  Procedure Laterality Date  . BUNIONECTOMY  09/04/2011   left  . CESAREAN SECTION    . CHOLECYSTECTOMY  1985  . COLONOSCOPY    . FOOT OSTEOTOMY  2006   Right foot  . TUBAL LIGATION  1983  .  VAGINAL HYSTERECTOMY  1993   Family History  Problem Relation Age of Onset  . Mitral valve prolapse Mother   . Hypertension Father   . Colon polyps Father   . Stroke Father   . Hypertension Sister        x2  . Diabetes Sister   . Colon cancer Maternal Aunt   . Stomach cancer Maternal Aunt   . Colon polyps Sister   . Pancreatic cancer Neg Hx    Social History   Socioeconomic History  . Marital status: Married    Spouse name: Not on file  . Number of children: 2  . Years of education:  Not on file  . Highest education level: Not on file  Occupational History  . Occupation: Retired  Tobacco Use  . Smoking status: Never Smoker  . Smokeless tobacco: Never Used  Substance and Sexual Activity  . Alcohol use: No  . Drug use: No  . Sexual activity: Yes  Other Topics Concern  . Not on file  Social History Narrative  . Not on file   Social Determinants of Health   Financial Resource Strain:   . Difficulty of Paying Living Expenses:   Food Insecurity:   . Worried About Programme researcher, broadcasting/film/video in the Last Year:   . Barista in the Last Year:   Transportation Needs:   . Freight forwarder (Medical):   Marland Kitchen Lack of Transportation (Non-Medical):   Physical Activity:   . Days of Exercise per Week:   . Minutes of Exercise per Session:   Stress:   . Feeling of Stress :   Social Connections:   . Frequency of Communication with Friends and Family:   . Frequency of Social Gatherings with Friends and Family:   . Attends Religious Services:   . Active Member of Clubs or Organizations:   . Attends Banker Meetings:   Marland Kitchen Marital Status:     Outpatient Encounter Medications as of 08/01/2019  Medication Sig  . AMBULATORY NON FORMULARY MEDICATION Medication Name: FDgard samples given #32 lot number [id number]  Exp. 12/2016  gm (Patient taking differently: Take 1 tablet by mouth 2 (two) times daily. Medication Name: FDgard samples given #32 lot number [id number]  Exp. 12/2016  gm)  . aspirin 81 MG tablet Take 81 mg by mouth daily.    . cholestyramine (QUESTRAN) 4 g packet Take 1 packet (4 g total) by mouth daily.  Marland Kitchen desonide (DESOWEN) 0.05 % cream Apply topically 2 (two) times daily.  Marland Kitchen esomeprazole (NEXIUM) 40 MG capsule TAKE 1 CAPSULE BY MOUTH 2 TIMES DAILY BEFORE A MEAL.  . famotidine (PEPCID) 20 MG tablet Take 1 tablet (20 mg total) by mouth at bedtime.  . fluocinonide gel (LIDEX) 0.05 % fluocinonide 0.05 % topical gel  APPLY 4-5 APPLICATIONS DAILY AS NEEDED  .  ketoconazole (NIZORAL) 2 % cream Apply 1 application topically daily as needed for irritation.  Marland Kitchen latanoprost (XALATAN) 0.005 % ophthalmic solution latanoprost 0.005 % eye drops  PLACE 1 DROP IN BOTH EYES AT BEDTIME  . levothyroxine (SYNTHROID) 50 MCG tablet Take 0.5 tablets (25 mcg total) by mouth daily.  Marland Kitchen loratadine (CLARITIN) 10 MG tablet Allergy Relief (loratadine) 10 mg tablet  TAKE 1 TABLET EVERY DAY AS NEEDED FOR ALLEGIES  . meloxicam (MOBIC) 15 MG tablet Take 15 mg by mouth daily.  . metoprolol tartrate (LOPRESSOR) 50 MG tablet TAKE 1 TABLET BY MOUTH TWICE A DAY  . metroNIDAZOLE (FLAGYL)  500 MG tablet Take 500 mg by mouth daily.  . metroNIDAZOLE (METROCREAM) 0.75 % cream Apply 1 application topically daily.  . NON FORMULARY FD Guard  . ondansetron (ZOFRAN) 4 MG tablet Take 1 tablet (4 mg total) by mouth every 8 (eight) hours as needed for nausea or vomiting.  Marland Kitchen PARoxetine (PAXIL) 10 MG tablet Paxil 10 mg tablet  Take 1 tablet every day by oral route.  . Probiotic Product (PROBIOTIC DAILY PO) Take 1 capsule by mouth daily.  . traMADol (ULTRAM) 50 MG tablet Take 50 mg by mouth every 12 (twelve) hours as needed. for pain  . triamcinolone cream (KENALOG) 0.1 % Apply 1 application topically 2 (two) times daily as needed (for breakout).   . valACYclovir (VALTREX) 500 MG tablet valacyclovir hcl 500 mg tabs  . azithromycin (ZITHROMAX Z-PAK) 250 MG tablet As directed (Patient not taking: Reported on 08/01/2019)   No facility-administered encounter medications on file as of 08/01/2019.    Activities of Daily Living In your present state of health, do you have any difficulty performing the following activities: 08/01/2019  Hearing? N  Vision? N  Difficulty concentrating or making decisions? N  Walking or climbing stairs? N  Dressing or bathing? N  Doing errands, shopping? N  Preparing Food and eating ? N  Using the Toilet? N  In the past six months, have you accidently leaked urine? Y    Comment wears a light pad  Do you have problems with loss of bowel control? N  Managing your Medications? N  Managing your Finances? N  Housekeeping or managing your Housekeeping? N  Some recent data might be hidden    Patient Care Team: Myrlene Broker, MD as PCP - General (Internal Medicine) Amelia Jo, MD as Consulting Physician (Neurology) Jens Som Madolyn Frieze, MD as Consulting Physician (Cardiology) Rachael Fee, MD as Attending Physician (Gastroenterology) Marcene Corning, MD as Consulting Physician (Orthopedic Surgery) Ilda Mori, MD as Attending Physician (Obstetrics and Gynecology) Janet Berlin, MD as Consulting Physician (Ophthalmology)    Assessment:   This is a routine wellness examination for Manali.  Exercise Activities and Dietary recommendations Current Exercise Habits: Home exercise routine, Type of exercise: walking, Time (Minutes): 30, Frequency (Times/Week): 1, Weekly Exercise (Minutes/Week): 30, Intensity: Moderate, Exercise limited by: None identified  Goals    . Exercise 150 minutes per week (moderate activity)     Bike and treadmill x 2 a week Will try to add an extra day 60 min x 3 days     . Patient Stated     I want to exercise more, I will go to the [facility] twice a week. Continue to eat healthy, enjoy life and family.    . Patient Stated     Lose weight by monitoring how many sweets I eat and by increasing my physical activity. I will go to [employer] more frequently.    . Patient Stated     To lose 15 pounds and continue to be active daily.    . Reduce sugar intake to X grams per day       Fall Risk Fall Risk  08/01/2019 07/30/2018 07/23/2017 11/22/2015 05/31/2015  Falls in the past year? 0 1 No No Yes  Number falls in past yr: 0 0 - - 1  Injury with Fall? 0 0 - - No  Risk for fall due to : No Fall Risks - - - -  Follow up Falls evaluation completed;Education provided;Falls prevention discussed Falls prevention discussed -  - -  Is the patient's home free of loose throw rugs in walkways, pet beds, electrical cords, etc?   yes      Grab bars in the bathroom? no      Handrails on the stairs?   yes      Adequate lighting?   yes  Depression Screen PHQ 2/9 Scores 08/01/2019 07/30/2018 07/23/2017 11/22/2015  PHQ - 2 Score 0 0 0 0  PHQ- 9 Score - - 2 -     Cognitive Function     6CIT Screen 08/01/2019  What Year? 0 points  What month? 0 points  What time? 0 points  Count back from 20 0 points  Months in reverse 0 points  Repeat phrase 0 points  Total Score 0    Immunization History  Administered Date(s) Administered  . Influenza Split 02/18/2011, 02/16/2012  . Influenza Whole 02/12/2009, 05/21/2010  . Influenza, High Dose Seasonal PF 04/04/2015, 02/07/2016, 12/19/2016  . Influenza, Seasonal, Injecte, Preservative Fre 03/29/2014  . Influenza,inj,Quad PF,6+ Mos 01/27/2013  . Influenza-Unspecified 07/14/2011, 01/12/2014, 01/12/2018, 12/09/2018  . PFIZER SARS-COV-2 Vaccination 05/04/2019, 05/23/2019  . Pneumococcal Conjugate-13 05/31/2015  . Pneumococcal Polysaccharide-23 03/29/2014  . Tdap 09/14/2012    Qualifies for Shingles Vaccine? Yes; will check with local pharmacy.  Screening Tests Health Maintenance  Topic Date Due  . Hepatitis C Screening  Never done  . MAMMOGRAM  01/10/2019  . INFLUENZA VACCINE  11/13/2019  . COLONOSCOPY  10/26/2021  . TETANUS/TDAP  09/15/2022  . DEXA SCAN  Completed  . COVID-19 Vaccine  Completed  . PNA vac Low Risk Adult  Completed    Cancer Screenings: Lung: Low Dose CT Chest recommended if Age 41-80 years, 30 pack-year currently smoking OR have quit w/in 15years. Patient does not qualify. Breast:  Up to date on Mammogram? Yes   Up to date of Bone Density/Dexa? Yes Colorectal: Yes     Plan:     Reviewed health maintenance screenings with patient today and relevant education, vaccines, and/or referrals were provided.    Continue doing brain stimulating  activities (puzzles, reading, adult coloring books, staying active) to keep memory sharp.    Continue to eat heart healthy diet (full of fruits, vegetables, whole grains, lean protein, water--limit salt, fat, and sugar intake) and increase physical activity as tolerated.  I have personally reviewed and noted the following in the patient's chart:   . Medical and social history . Use of alcohol, tobacco or illicit drugs  . Current medications and supplements . Functional ability and status . Nutritional status . Physical activity . Advanced directives . List of other physicians . Hospitalizations, surgeries, and ER visits in previous 12 months . Vitals . Screenings to include cognitive, depression, and falls . Referrals and appointments  In addition, I have reviewed and discussed with patient certain preventive protocols, quality metrics, and best practice recommendations. A written personalized care plan for preventive services as well as general preventive health recommendations were provided to patient.     Mickeal Needy, LPN  [id number]  Nurse Health Advisor

## 2019-08-01 NOTE — Patient Instructions (Addendum)
[name] , Thank you for taking time to come for your Medicare Wellness Visit. I appreciate your ongoing commitment to your health goals. Please review the following plan we discussed and let me know if I can assist you in the future.   Screening recommendations/referrals: Colorectal Screening: 10/27/2011 Mammogram: 09/27/2018 Bone Density: N/A  Vision and Dental Exams: Recommended annual ophthalmology exams for early detection of glaucoma and other disorders of the eye Recommended annual dental exams for proper oral hygiene  Vaccinations: Influenza vaccine: 12/09/2018 Pneumococcal vaccine: completed Pneumovax 03/29/2014 and Prevnar 05/31/2015 Tdap vaccine: 09/14/2012; Due every 10 years Shingles vaccine: Please call your insurance company to determine your out of pocket expense for the Shingrix vaccine. You may receive this vaccine at your local pharmacy. Covid vaccine: completed; Pfizer 05/04/2019 and 05/23/2019  Advanced directives: Advance directives discussed with you today. I have provided a copy for you to complete at home and have notarized. Once this is complete please bring a copy in to our office so we can scan it into your chart.  Goals:  Recommend to drink at least 6-8 8oz glasses of water per day.  Recommend to exercise for at least 150 minutes per week.  Recommend to remove any items from the home that may cause slips or trips.  Recommend to decrease portion sizes by eating 3 small healthy meals and at least 2 healthy snacks per day.  Recommend to begin DASH diet as directed below  Recommend to continue efforts to reduce smoking habits until no longer smoking. Smoking Cessation literature is attached below.  Next appointment: Please schedule your Annual Wellness Visit with your Nurse Health Advisor in one year.  Preventive Care 66 Years and Older, Female Preventive care refers to lifestyle choices and visits with your health care provider that can promote health and  wellness. What does preventive care include?  A yearly physical exam. This is also called an annual well check.  Dental exams once or twice a year.  Routine eye exams. Ask your health care provider how often you should have your eyes checked.  Personal lifestyle choices, including:  Daily care of your teeth and gums.  Regular physical activity.  Eating a healthy diet.  Avoiding tobacco and drug use.  Limiting alcohol use.  Practicing safe sex.  Taking low-dose aspirin every day if recommended by your health care provider.  Taking vitamin and mineral supplements as recommended by your health care provider. What happens during an annual well check? The services and screenings done by your health care provider during your annual well check will depend on your age, overall health, lifestyle risk factors, and family history of disease. Counseling  Your health care provider may ask you questions about your:  Alcohol use.  Tobacco use.  Drug use.  Emotional well-being.  Home and relationship well-being.  Sexual activity.  Eating habits.  History of falls.  Memory and ability to understand (cognition).  Work and work Astronomer.  Reproductive health. Screening  You may have the following tests or measurements:  Height, weight, and BMI.  Blood pressure.  Lipid and cholesterol levels. These may be checked every 5 years, or more frequently if you are over 75 years old.  Skin check.  Lung cancer screening. You may have this screening every year starting at age 52 if you have a 30-pack-year history of smoking and currently smoke or have quit within the past 15 years.  Fecal occult blood test (FOBT) of the stool. You may have this  test every year starting at age 56.  Flexible sigmoidoscopy or colonoscopy. You may have a sigmoidoscopy every 5 years or a colonoscopy every 10 years starting at age 41.  Hepatitis C blood test.  Hepatitis B blood test.  Sexually  transmitted disease (STD) testing.  Diabetes screening. This is done by checking your blood sugar (glucose) after you have not eaten for a while (fasting). You may have this done every 1-3 years.  Bone density scan. This is done to screen for osteoporosis. You may have this done starting at age 35.  Mammogram. This may be done every 1-2 years. Talk to your health care provider about how often you should have regular mammograms. Talk with your health care provider about your test results, treatment options, and if necessary, the need for more tests. Vaccines  Your health care provider may recommend certain vaccines, such as:  Influenza vaccine. This is recommended every year.  Tetanus, diphtheria, and acellular pertussis (Tdap, Td) vaccine. You may need a Td booster every 10 years.  Zoster vaccine. You may need this after age 62.  Pneumococcal 13-valent conjugate (PCV13) vaccine. One dose is recommended after age 76.  Pneumococcal polysaccharide (PPSV23) vaccine. One dose is recommended after age 57. Talk to your health care provider about which screenings and vaccines you need and how often you need them. This information is not intended to replace advice given to you by your health care provider. Make sure you discuss any questions you have with your health care provider. Document Released: 04/27/2015 Document Revised: 12/19/2015 Document Reviewed: 01/30/2015 Elsevier Interactive Patient Education  2017 [employer] Prevention in the Home Falls can cause injuries. They can happen to people of all ages. There are many things you can do to make your home safe and to help prevent falls. What can I do on the outside of my home?  Regularly fix the edges of walkways and driveways and fix any cracks.  Remove anything that might make you trip as you walk through a door, such as a raised step or threshold.  Trim any bushes or trees on the path to your home.  Use bright outdoor  lighting.  Clear any walking paths of anything that might make someone trip, such as rocks or tools.  Regularly check to see if handrails are loose or broken. Make sure that both sides of any steps have handrails.  Any raised decks and porches should have guardrails on the edges.  Have any leaves, snow, or ice cleared regularly.  Use sand or salt on walking paths during winter.  Clean up any spills in your garage right away. This includes oil or grease spills. What can I do in the bathroom?  Use night lights.  Install grab bars by the toilet and in the tub and shower. Do not use towel bars as grab bars.  Use non-skid mats or decals in the tub or shower.  If you need to sit down in the shower, use a plastic, non-slip stool.  Keep the floor dry. Clean up any water that spills on the floor as soon as it happens.  Remove soap buildup in the tub or shower regularly.  Attach bath mats securely with double-sided non-slip rug tape.  Do not have throw rugs and other things on the floor that can make you trip. What can I do in the bedroom?  Use night lights.  Make sure that you have a light by your bed that is easy to reach.  Do not use any sheets or blankets that are too big for your bed. They should not hang down onto the floor.  Have a firm chair that has side arms. You can use this for support while you get dressed.  Do not have throw rugs and other things on the floor that can make you trip. What can I do in the kitchen?  Clean up any spills right away.  Avoid walking on wet floors.  Keep items that you use a lot in easy-to-reach places.  If you need to reach something above you, use a strong step stool that has a grab bar.  Keep electrical cords out of the way.  Do not use floor polish or wax that makes floors slippery. If you must use wax, use non-skid floor wax.  Do not have throw rugs and other things on the floor that can make you trip. What can I do with my  stairs?  Do not leave any items on the stairs.  Make sure that there are handrails on both sides of the stairs and use them. Fix handrails that are broken or loose. Make sure that handrails are as long as the stairways.  Check any carpeting to make sure that it is firmly attached to the stairs. Fix any carpet that is loose or worn.  Avoid having throw rugs at the top or bottom of the stairs. If you do have throw rugs, attach them to the floor with carpet tape.  Make sure that you have a light switch at the top of the stairs and the bottom of the stairs. If you do not have them, ask someone to add them for you. What else can I do to help prevent falls?  Wear shoes that:  Do not have high heels.  Have rubber bottoms.  Are comfortable and fit you well.  Are closed at the toe. Do not wear sandals.  If you use a stepladder:  Make sure that it is fully opened. Do not climb a closed stepladder.  Make sure that both sides of the stepladder are locked into place.  Ask someone to hold it for you, if possible.  Clearly mark and make sure that you can see:  Any grab bars or handrails.  First and last steps.  Where the edge of each step is.  Use tools that help you move around (mobility aids) if they are needed. These include:  Canes.  Walkers.  Scooters.  Crutches.  Turn on the lights when you go into a dark area. Replace any light bulbs as soon as they burn out.  Set up your furniture so you have a clear path. Avoid moving your furniture around.  If any of your floors are uneven, fix them.  If there are any pets around you, be aware of where they are.  Review your medicines with your doctor. Some medicines can make you feel dizzy. This can increase your chance of falling. Ask your doctor what other things that you can do to help prevent falls. This information is not intended to replace advice given to you by your health care provider. Make sure you discuss any  questions you have with your health care provider. Document Released: 01/25/2009 Document Revised: 09/06/2015 Document Reviewed: 05/05/2014 Elsevier Interactive Patient Education  2017 [employer].

## 2019-08-02 NOTE — Telephone Encounter (Signed)
Thanks

## 2019-08-17 ENCOUNTER — Other Ambulatory Visit: Payer: Self-pay | Admitting: Internal Medicine

## 2019-08-22 ENCOUNTER — Encounter (HOSPITAL_BASED_OUTPATIENT_CLINIC_OR_DEPARTMENT_OTHER): Payer: Self-pay | Admitting: Emergency Medicine

## 2019-08-22 ENCOUNTER — Other Ambulatory Visit: Payer: Self-pay

## 2019-08-22 ENCOUNTER — Emergency Department (HOSPITAL_BASED_OUTPATIENT_CLINIC_OR_DEPARTMENT_OTHER): Payer: Medicare PPO

## 2019-08-22 ENCOUNTER — Emergency Department (HOSPITAL_BASED_OUTPATIENT_CLINIC_OR_DEPARTMENT_OTHER)
Admission: EM | Admit: 2019-08-22 | Discharge: 2019-08-22 | Disposition: A | Discharge status: Home / Self Care | Payer: Medicare PPO | Attending: Emergency Medicine | Admitting: Emergency Medicine

## 2019-08-22 DIAGNOSIS — R0602 Shortness of breath: Secondary | ICD-10-CM | POA: Insufficient documentation

## 2019-08-22 DIAGNOSIS — R0981 Nasal congestion: Secondary | ICD-10-CM | POA: Diagnosis not present

## 2019-08-22 DIAGNOSIS — R059 Cough, unspecified: Secondary | ICD-10-CM

## 2019-08-22 DIAGNOSIS — Z7982 Long term (current) use of aspirin: Secondary | ICD-10-CM | POA: Diagnosis not present

## 2019-08-22 DIAGNOSIS — I10 Essential (primary) hypertension: Secondary | ICD-10-CM | POA: Diagnosis not present

## 2019-08-22 DIAGNOSIS — R05 Cough: Secondary | ICD-10-CM | POA: Diagnosis not present

## 2019-08-22 DIAGNOSIS — R002 Palpitations: Secondary | ICD-10-CM | POA: Diagnosis not present

## 2019-08-22 DIAGNOSIS — E039 Hypothyroidism, unspecified: Secondary | ICD-10-CM | POA: Diagnosis not present

## 2019-08-22 DIAGNOSIS — Z9049 Acquired absence of other specified parts of digestive tract: Secondary | ICD-10-CM | POA: Diagnosis not present

## 2019-08-22 DIAGNOSIS — Z79899 Other long term (current) drug therapy: Secondary | ICD-10-CM | POA: Diagnosis not present

## 2019-08-22 LAB — CBC WITH DIFFERENTIAL/PLATELET
Abs Immature Granulocytes: 0.01 10*3/uL (ref 0.00–0.07)
Basophils Absolute: 0 10*3/uL (ref 0.0–0.1)
Basophils Relative: 1 %
Eosinophils Absolute: 0.2 10*3/uL (ref 0.0–0.5)
Eosinophils Relative: 3 %
HCT: 36 % (ref 36.0–46.0)
Hemoglobin: 12.6 g/dL (ref 12.0–15.0)
Immature Granulocytes: 0 %
Lymphocytes Relative: 51 %
Lymphs Abs: 3.7 10*3/uL (ref 0.7–4.0)
MCH: 32.2 pg (ref 26.0–34.0)
MCHC: 35 g/dL (ref 30.0–36.0)
MCV: 92.1 fL (ref 80.0–100.0)
Monocytes Absolute: 0.8 10*3/uL (ref 0.1–1.0)
Monocytes Relative: 11 %
Neutro Abs: 2.4 10*3/uL (ref 1.7–7.7)
Neutrophils Relative %: 34 %
Platelets: 286 10*3/uL (ref 150–400)
RBC: 3.91 MIL/uL (ref 3.87–5.11)
RDW: 14 % (ref 11.5–15.5)
WBC: 7.2 10*3/uL (ref 4.0–10.5)
nRBC: 0 % (ref 0.0–0.2)

## 2019-08-22 LAB — COMPREHENSIVE METABOLIC PANEL
ALT: 39 U/L (ref 0–44)
AST: 53 U/L — ABNORMAL HIGH (ref 15–41)
Albumin: 4 g/dL (ref 3.5–5.0)
Alkaline Phosphatase: 66 U/L (ref 38–126)
Anion gap: 9 (ref 5–15)
BUN: 13 mg/dL (ref 8–23)
CO2: 25 mmol/L (ref 22–32)
Calcium: 9.2 mg/dL (ref 8.9–10.3)
Chloride: 104 mmol/L (ref 98–111)
Creatinine, Ser: 0.79 mg/dL (ref 0.44–1.00)
GFR calc Af Amer: >60 mL/min (ref >60)
GFR calc non Af Amer: >60 mL/min (ref >60)
Glucose, Bld: 132 mg/dL — ABNORMAL HIGH (ref 70–99)
Potassium: 3.7 mmol/L (ref 3.5–5.1)
Sodium: 138 mmol/L (ref 135–145)
Total Bilirubin: 0.6 mg/dL (ref 0.3–1.2)
Total Protein: 7.3 g/dL (ref 6.5–8.1)

## 2019-08-22 LAB — BRAIN NATRIURETIC PEPTIDE: B Natriuretic Peptide: 76.2 pg/mL (ref 0.0–100.0)

## 2019-08-22 MED ORDER — MONTELUKAST SODIUM 10 MG PO TABS
10.0000 mg | ORAL_TABLET | Freq: Every day | ORAL | 0 refills | Status: DC
Start: 2019-08-22 — End: 2020-01-16

## 2019-08-22 MED ORDER — MOMETASONE FUROATE 50 MCG/ACT NA SUSP
2.0000 | Freq: Every day | NASAL | 0 refills | Status: DC
Start: 2019-08-22 — End: 2020-04-18

## 2019-08-22 NOTE — Discharge Instructions (Signed)
Use the nasal spray to help with nasal congestion and cough. Take Singulair daily to decrease congestion. Continue taking home medications as prescribed. Follow-up with your primary care doctor in 1 week as needed if your symptoms not improving. Return to the emergency room with any new, worsening, concerning symptoms.

## 2019-08-22 NOTE — ED Provider Notes (Addendum)
[facility] EMERGENCY DEPARTMENT Provider Note   CSN: [id number] Arrival date & time: 08/22/19  1643     History Chief Complaint  Patient presents with  . Wheezing  . Chest Pain    Angela [name] is a 74 y.o. female presenting for evaluation of nasal congestion, cough, intermittent shortness of breath.  Patient states for the past several weeks, she has been having nasal congestion, cough, intermittent shortness of breath.  She is here today because over the past week, she has had several nights where she is awoken from sleep feeling like her heart is racing.  She is able to go back to sleep soon after that, without further events.  This is not happening every night. She states he husband says she is now snoring at night, she does not have and has not been tested for sleep apnea.  She also feels she is more short of breath with exertion.  She denies fevers, chills, chest pain, nausea, vomiting abdominal pain, urinary symptoms, abnormal bowel movements.  She denies leg pain or swelling.  She has received both Covid vaccines.  She has not been tested for Covid since her symptoms began.  She has had 2 televisits with her doctor, states she has been on 2 different rounds of antibiotics without improvement of symptoms.  The first round was a Z-Pak, she is not sure the name of the second antibiotic.  She has not tried anything else including nasal sprays or decongestants.  HPI     Past Medical History:  Diagnosis Date  . Acne rosacea   . Anxiety   . Bile salt-induced diarrhea   . Colon polyps   . Degenerative disc disease   . Diverticulitis 05/2015  . Diverticulosis 05/10/2004  . Duodenitis 04/25/2005  . Esophageal stricture 09/13/2007  . Esophagitis 04/25/2005  . External hemorrhoids 05/10/2004  . 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 Fibromyalgia   . Gastroparesis   . GERD (gastroesophageal reflux disease) 09/13/2007  . Glaucoma   . Hiatal hernia  04/25/2005  . Hyperlipidemia   . Hypertension   . Hypothyroidism   . IBS (irritable bowel syndrome)   . Migraine   . Osteoarthritis   . Other and unspecified noninfectious gastroenteritis and colitis(558.9)   . Sessile colonic polyp 05/30/2009  . Vertigo   . Vitamin D deficiency     Patient Active Problem List   Diagnosis Date Noted  . Cough 04/12/2019  . Suspected COVID-19 virus infection 11/25/2018  . Upper respiratory infection 12/29/2017  . Stress incontinence 10/23/2017  . Bacterial vaginosis 09/21/2017  . Atypical squamous cells of undetermined significance (ASC-US) on cervical Pap smear 09/21/2017  . Osteoarthritis   . IBS (irritable bowel syndrome)   . Hypertension   . Glaucoma   . Gastroparesis   . Colon polyps   . Bile salt-induced diarrhea   . Anxiety   . Acne rosacea   . Routine general medical examination at a health care facility 03/03/2017  . Abnormal CT of the head 11/10/2016  . Vertigo 11/09/2016  . Allergic rhinitis 02/07/2016  . Lichen of skin 59/56/3875  . Diverticulitis 05/16/2015  . Diarrhea 02/13/2015  . Hemorrhoid 02/13/2015  . Numbness and tingling in hands 05/15/2014  . Cerebrovascular disease 10/18/2012  . Arteriopathy, intracranial, fibromuscular dysplasia, chronic ([place]) 10/18/2012  . Fibromuscular hyperplasia of artery ([place])   . Migraine headache 09/24/2011  . GERD (gastroesophageal reflux disease) 08/16/2010  . Hyperlipidemia 12/20/2009  .  Hypothyroidism 07/20/2009  . Vitamin D deficiency 07/20/2009  . Esophageal stricture 09/13/2007  . Essential hypertension 05/14/2007  . ACNE ROSACEA 05/14/2007  . OSTEOARTHRITIS 05/14/2007  . CAROTID BRUIT 05/14/2007  . Fibromyalgia 03/30/2007  . Hiatal hernia 04/25/2005  . Esophagitis 04/25/2005  . Duodenitis 04/25/2005  . External hemorrhoids 05/10/2004  . Diverticulosis 05/10/2004    Past Surgical History:  Procedure Laterality Date  . BUNIONECTOMY  09/04/2011   left  . CESAREAN SECTION     . CHOLECYSTECTOMY  1985  . COLONOSCOPY    . FOOT OSTEOTOMY  2006   Right foot  . TUBAL LIGATION  1983  . VAGINAL HYSTERECTOMY  1993     OB History   No obstetric history on file.     Family History  Problem Relation Age of Onset  . Mitral valve prolapse Mother   . Hypertension Father   . Colon polyps Father   . Stroke Father   . Hypertension Sister        x2  . Diabetes Sister   . Colon cancer Maternal Aunt   . Stomach cancer Maternal Aunt   . Colon polyps Sister   . Pancreatic cancer Neg Hx     Social History   Tobacco Use  . Smoking status: Never Smoker  . Smokeless tobacco: Never Used  Substance Use Topics  . Alcohol use: No  . Drug use: No    Home Medications Prior to Admission medications   Medication Sig Start Date End Date Taking? Authorizing Provider  AMBULATORY NON FORMULARY MEDICATION Medication Name: FDgard samples given #32 lot number [id number]  Exp. 12/2016  gm Patient taking differently: Take 1 tablet by mouth 2 (two) times daily. Medication Name: FDgard samples given #32 lot number [id number]  Exp. 12/2016  gm 07/22/16   Unk Lightning, PA  aspirin 81 MG tablet Take 81 mg by mouth daily.      [provider]  azithromycin (ZITHROMAX Z-PAK) 250 MG tablet As directed Patient not taking: Reported on 08/01/2019 07/14/19   Plotnikov, Georgina Quint, MD  cholestyramine (QUESTRAN) 4 g packet Take 1 packet (4 g total) by mouth daily. 09/07/18   Rachael Fee, MD  desonide (DESOWEN) 0.05 % cream Apply topically 2 (two) times daily. 12/22/16   Myra Rude, MD  esomeprazole (NEXIUM) 40 MG capsule TAKE 1 CAPSULE BY MOUTH 2 TIMES DAILY BEFORE A MEAL. 07/01/19   Rachael Fee, MD  famotidine (PEPCID) 20 MG tablet Take 1 tablet (20 mg total) by mouth at bedtime. Annual appt due in June must see provider for future refills 08/17/19   Myrlene Broker, MD  fluocinonide gel (LIDEX) 0.05 % fluocinonide 0.05 % topical gel  APPLY 4-5 APPLICATIONS DAILY AS  NEEDED    [provider]  ketoconazole (NIZORAL) 2 % cream Apply 1 application topically daily as needed for irritation.    [provider]  latanoprost (XALATAN) 0.005 % ophthalmic solution latanoprost 0.005 % eye drops  PLACE 1 DROP IN BOTH EYES AT BEDTIME    [provider]  levothyroxine (SYNTHROID) 50 MCG tablet Take 0.5 tablets (25 mcg total) by mouth daily. 10/11/18   Myrlene Broker, MD  loratadine (CLARITIN) 10 MG tablet Allergy Relief (loratadine) 10 mg tablet  TAKE 1 TABLET EVERY DAY AS NEEDED FOR ALLEGIES    [provider]  meloxicam (MOBIC) 15 MG tablet Take 15 mg by mouth daily.    [provider]  metoprolol tartrate (LOPRESSOR) 50  MG tablet TAKE 1 TABLET BY MOUTH TWICE A DAY 07/05/19   Lewayne Buntingrenshaw, Brian S, MD  metroNIDAZOLE (FLAGYL) 500 MG tablet Take 500 mg by mouth daily. 11/16/18   [provider]  metroNIDAZOLE (METROCREAM) 0.75 % cream Apply 1 application topically daily.    [provider]  mometasone (NASONEX) 50 MCG/ACT nasal spray Place 2 sprays into the nose daily. 08/22/19   Demorio Seeley, PA-C  montelukast (SINGULAIR) 10 MG tablet Take 1 tablet (10 mg total) by mouth at bedtime. 08/22/19   Glenda Kunst, PA-C  NON FORMULARY FD Guard    [provider]  ondansetron (ZOFRAN) 4 MG tablet Take 1 tablet (4 mg total) by mouth every 8 (eight) hours as needed for nausea or vomiting. 11/25/18   Myrlene Brokerrawford, Elizabeth A, MD  PARoxetine (PAXIL) 10 MG tablet Paxil 10 mg tablet  Take 1 tablet every day by oral route.    [provider]  Probiotic Product (PROBIOTIC DAILY PO) Take 1 capsule by mouth daily.    [provider]  traMADol (ULTRAM) 50 MG tablet Take 50 mg by mouth every 12 (twelve) hours as needed. for pain 05/27/17   [provider]  triamcinolone cream (KENALOG) 0.1 % Apply 1 application topically 2 (two) times daily as needed (for breakout).     [provider]  valACYclovir (VALTREX) 500 MG tablet valacyclovir hcl 500 mg tabs    [provider]    Allergies    Shellfish allergy, Tetracycline, Effexor xr [venlafaxine hcl], Codeine, and Paroxetine  Review of Systems   Review of Systems  HENT: Positive for congestion.   Respiratory: Positive for cough.   Cardiovascular: Positive for palpitations (intermittent, at night).  All other systems reviewed and are negative.   Physical Exam Updated Vital Signs BP 137/69 (BP Location: Right Arm)   Pulse 62   Resp (!) 22   Ht 5\' 1"  (1.549 m)   Wt 78.5 kg   SpO2 99%   BMI 32.69 kg/m   Physical Exam Vitals and nursing note reviewed.  Constitutional:      General: She is not in acute distress.    Appearance: She is well-developed.     Comments: Resting in the bed in no acute distress  HENT:     Head: Normocephalic and atraumatic.     Ears:     Comments: Fluid behind bilateral TMs without signs of infection.    Nose: Mucosal edema present.     Right Sinus: Frontal sinus tenderness present.     Left Sinus: Frontal sinus tenderness present.     Comments: Mucosal edema and congestion.  Tenderness to palpation of frontal sinuses Eyes:     Conjunctiva/sclera: Conjunctivae normal.     Pupils: Pupils are equal, round, and reactive to light.  Cardiovascular:     Rate and Rhythm: Normal rate and regular rhythm.     Pulses: Normal pulses.     Comments: Regular rate and rhythm Pulmonary:     Effort: Pulmonary effort is normal. No respiratory distress.     Breath sounds: Normal breath sounds. No wheezing.     Comments: Speaking in full sentences.  Clear lung sounds in all fields.  No wheezing. Abdominal:     General: There is no distension.     Palpations: Abdomen is soft. There is no mass.     Tenderness: There is no abdominal tenderness. There is no guarding or rebound.  Musculoskeletal:        General:  Normal range of motion.     Cervical back: Normal range of motion and neck  supple.     Right lower leg: No edema.     Left lower leg: No edema.     Comments: No leg pain or swelling  Skin:    General: Skin is warm and dry.     Capillary Refill: Capillary refill takes less than 2 seconds.  Neurological:     Mental Status: She is alert and oriented to person, place, and time.     ED Results / Procedures / Treatments   Labs (all labs ordered are listed, but only abnormal results are displayed) Labs Reviewed  COMPREHENSIVE METABOLIC PANEL - Abnormal; Notable for the following components:      Result Value   Glucose, Bld 132 (*)    AST 53 (*)    All other components within normal limits  CBC WITH DIFFERENTIAL/PLATELET  BRAIN NATRIURETIC PEPTIDE    EKG EKG Interpretation  Date/Time:  Monday Aug 22 2019 16:53:50 EDT Ventricular Rate:  67 PR Interval:  154 QRS Duration: 74 QT Interval:  406 QTC Calculation: 429 R Axis:   19 Text Interpretation: Normal sinus rhythm Normal ECG Confirmed by Marianna Fuss ([id number]) on 08/22/2019 5:52:57 PM   Radiology DG Chest 2 View  Result Date: 08/22/2019 CLINICAL DATA:  Shortness of breath and wheezing EXAM: CHEST - 2 VIEW COMPARISON:  06/30/2013 FINDINGS: The heart size and mediastinal contours are within normal limits. Both lungs are clear. The visualized skeletal structures are unremarkable. IMPRESSION: No active cardiopulmonary disease. Electronically Signed   By: Alcide Clever M.D.   On: 08/22/2019 18:05    Procedures Procedures (including critical care time)  Medications Ordered in ED Medications - No data to display  ED Course  I have reviewed the triage vital signs and the nursing notes.  Pertinent labs & imaging results that were available during my care of the patient were reviewed by me and considered in my medical decision making (see chart for details).    MDM Rules/Calculators/A&P                      Patient presented for evaluation nasal congestion, cough, and intermittent shortness of  breath.  Occasionally, she also reports she is waking up feeling like her heart is racing.  On exam, patient appears nontoxic.  She is afebrile and not tachycardic.  Heart regular rate and rhythm.  Lung sounds are clear.  However considering continued symptoms, will obtain x-ray to rule out pneumonia.  Nasal congestion noted, as well as fluid behind bilateral TMs.  Consider eustachian tube dysfunction versus allergies versus viral illness.  Patient reports she is snoring at night, consider sleep apnea.  As she is having shortness of breath with exertion and waking up in the night, will also obtain a BNP.  Labs to ensure no electrolyte abnormality, anemia, or concerning leukocytosis.  History and exam is not consistent with PE or ACS.  Patient is without wheezing at this time, I do not believe she needs an inhaler.  Labs interpreted by me, overall reassuring.  No leukocytosis.  Electrolytes stable.  No anemia.  BMP normal.  EKG shows normal sinus rhythm.  Chest x-ray viewed interpreted by me, no pneumonia pneumothorax and effusion, cardiomegaly.  As such, favor allergies worse eustachian tube dysfunction causing continue nasal congestion and cough.  Will treat with nasal spray and Singulair.  Will have patient follow-up with primary care.  Case discussed  with attending, Dr. Stevie Kern evaluated the patient.  At this time, patient appears safe for discharge.  Return precautions given.  Patient states she understands and agrees to plan.  Final Clinical Impression(s) / ED Diagnoses Final diagnoses:  Nasal congestion  Cough    Rx / DC Orders ED Discharge Orders         Ordered    mometasone (NASONEX) 50 MCG/ACT nasal spray  Daily     08/22/19 1854    montelukast (SINGULAIR) 10 MG tablet  Daily at bedtime     08/22/19 1854           Claira Jeter, PA-C 08/22/19 2235    Alveria Apley, PA-C 08/22/19 2236    Milagros Loll, MD 08/24/19 1650

## 2019-08-22 NOTE — ED Triage Notes (Signed)
States for 3  Weeks has had sob and wheezing and lightheaded ,  CP pounding  In last week or so , it would wake her  Up, saw a dr and was seen virtual dr and was told to cojme to ER has been on antiotics for sinus infection in last 3 weeks

## 2019-09-22 DIAGNOSIS — Z01419 Encounter for gynecological examination (general) (routine) without abnormal findings: Secondary | ICD-10-CM | POA: Diagnosis not present

## 2019-09-24 ENCOUNTER — Other Ambulatory Visit: Payer: Self-pay | Admitting: Internal Medicine

## 2019-09-28 DIAGNOSIS — H401131 Primary open-angle glaucoma, bilateral, mild stage: Secondary | ICD-10-CM | POA: Diagnosis not present

## 2019-10-03 DIAGNOSIS — Z1231 Encounter for screening mammogram for malignant neoplasm of breast: Secondary | ICD-10-CM | POA: Diagnosis not present

## 2019-10-12 ENCOUNTER — Telehealth: Payer: Self-pay | Admitting: Cardiology

## 2019-10-12 NOTE — Telephone Encounter (Signed)
The patient called into triage with complaints of a strong chest pain that lasted around an hour. She stated that she drank some ginger ale, threw up, and started belching which made the pain go away. She has had this happen before and stated that belching made the pain go away. She denies chest pain currently and denies any other symptoms. She stated that she feels fine.  She is going to follow up with her GI provider as well. She has been advised that if the pain comes back that she should go to an urgent care, call 911 or proceed to the ED.

## 2019-10-12 NOTE — Telephone Encounter (Signed)
    Pt c/o of Chest Pain: STAT if CP now or developed within 24 hours  1. Are you having CP right now? No  2. Are you experiencing any other symptoms (ex. SOB, nausea, vomiting, sweating)? Vomiting, dizziness, leg pain  3. How long have you been experiencing CP? 11:30 am   4. Is your CP continuous or coming and going? continuous   5. Have you taken Nitroglycerin? No  Pt said she felt unusual symptoms around 11:30 am she felt pain right in the middle of her chest, she thought its indigestion so she drank ginger ale, then she threw up. She laid down and felt dizzy and her leg started to hurt. She said it took an hour before the pain subside   ?

## 2019-11-15 ENCOUNTER — Other Ambulatory Visit: Payer: Self-pay | Admitting: Internal Medicine

## 2019-11-24 ENCOUNTER — Ambulatory Visit: Payer: Medicare PPO | Admitting: Internal Medicine

## 2019-11-24 ENCOUNTER — Other Ambulatory Visit: Payer: Self-pay

## 2019-11-24 ENCOUNTER — Encounter: Payer: Self-pay | Admitting: Internal Medicine

## 2019-11-24 VITALS — BP 126/76 | HR 63 | Temp 98.6°F | Ht 61.0 in | Wt 178.0 lb

## 2019-11-24 DIAGNOSIS — I10 Essential (primary) hypertension: Secondary | ICD-10-CM

## 2019-11-24 DIAGNOSIS — E039 Hypothyroidism, unspecified: Secondary | ICD-10-CM | POA: Diagnosis not present

## 2019-11-24 DIAGNOSIS — R42 Dizziness and giddiness: Secondary | ICD-10-CM | POA: Diagnosis not present

## 2019-11-24 DIAGNOSIS — E785 Hyperlipidemia, unspecified: Secondary | ICD-10-CM

## 2019-11-24 NOTE — Progress Notes (Signed)
   Subjective:   Patient ID: Angela [name], female    DOB: 1946/03/09, 74 y.o.   MRN: [id number]  HPI The patient is a 75 YO female coming in for dizziness. Started in the last several months or so with rare episodes. She has had vertigo in the past and this did not feel the same. Felt more like she might fall down. Denies falls recently due to this but is concerned about losing balance. Does not happen every time she stands up. Denies fevers or chills. Denies blood in stool or dark stool. Denies chest pains or SOB. Denies headaches or sinus problems.   Review of Systems  Constitutional: Negative.   HENT: Negative.   Eyes: Negative.   Respiratory: Negative for cough, chest tightness and shortness of breath.   Cardiovascular: Negative for chest pain, palpitations and leg swelling.  Gastrointestinal: Negative for abdominal distention, abdominal pain, constipation, diarrhea, nausea and vomiting.  Musculoskeletal: Negative.   Skin: Negative.   Neurological: Positive for dizziness and light-headedness.  Psychiatric/Behavioral: Negative.     Objective:  Physical Exam Constitutional:      Appearance: She is well-developed.  HENT:     Head: Normocephalic and atraumatic.     Right Ear: Tympanic membrane normal.     Left Ear: Tympanic membrane normal.  Cardiovascular:     Rate and Rhythm: Normal rate and regular rhythm.  Pulmonary:     Effort: Pulmonary effort is normal. No respiratory distress.     Breath sounds: Normal breath sounds. No wheezing or rales.  Abdominal:     General: Bowel sounds are normal. There is no distension.     Palpations: Abdomen is soft.     Tenderness: There is no abdominal tenderness. There is no rebound.  Musculoskeletal:     Cervical back: Normal range of motion.  Skin:    General: Skin is warm and dry.  Neurological:     Mental Status: She is alert and oriented to person, place, and time.     Coordination: Coordination normal.     Vitals:    11/24/19 1429  BP: 126/76  Pulse: 63  Temp: 98.6 F (37 C)  TempSrc: Oral  SpO2: 96%  Weight: 178 lb (80.7 kg)  Height: 5\' 1"  (1.549 m)    This visit occurred during the SARS-CoV-2 public health emergency.  Safety protocols were in place, including screening questions prior to the visit, additional usage of staff PPE, and extensive cleaning of exam room while observing appropriate contact time as indicated for disinfecting solutions.   Assessment & Plan:

## 2019-11-24 NOTE — Patient Instructions (Signed)
We will check the labs today.  If the labs are normal we will have you try 1/2 pill metoprolol in the morning and 1 pill in the evening to see if this helps.

## 2019-11-25 ENCOUNTER — Encounter: Payer: Self-pay | Admitting: Internal Medicine

## 2019-11-25 DIAGNOSIS — R42 Dizziness and giddiness: Secondary | ICD-10-CM | POA: Insufficient documentation

## 2019-11-25 LAB — CBC
HCT: 37.3 % (ref 35.0–45.0)
Hemoglobin: 12.5 g/dL (ref 11.7–15.5)
MCH: 31.6 pg (ref 27.0–33.0)
MCHC: 33.5 g/dL (ref 32.0–36.0)
MCV: 94.4 fL (ref 80.0–100.0)
MPV: 10.6 fL (ref 7.5–12.5)
Platelets: 286 10*3/uL (ref 140–400)
RBC: 3.95 10*6/uL (ref 3.80–5.10)
RDW: 13.2 % (ref 11.0–15.0)
WBC: 6.2 10*3/uL (ref 3.8–10.8)

## 2019-11-25 LAB — LIPID PANEL
Cholesterol: 203 mg/dL — ABNORMAL HIGH (ref <200)
HDL: 52 mg/dL (ref >=50)
LDL Cholesterol (Calc): 115 mg/dL (calc) — ABNORMAL HIGH
Non-HDL Cholesterol (Calc): 151 mg/dL (calc) — ABNORMAL HIGH (ref <130)
Total CHOL/HDL Ratio: 3.9 (calc) (ref <5.0)
Triglycerides: 249 mg/dL — ABNORMAL HIGH (ref <150)

## 2019-11-25 LAB — HEMOGLOBIN A1C
Hgb A1c MFr Bld: 5.9 % of total Hgb — ABNORMAL HIGH (ref <5.7)
Mean Plasma Glucose: 123 (calc)
eAG (mmol/L): 6.8 (calc)

## 2019-11-25 LAB — COMPREHENSIVE METABOLIC PANEL
AG Ratio: 1.7 (calc) (ref 1.0–2.5)
ALT: 30 U/L — ABNORMAL HIGH (ref 6–29)
AST: 30 U/L (ref 10–35)
Albumin: 4.2 g/dL (ref 3.6–5.1)
Alkaline phosphatase (APISO): 74 U/L (ref 37–153)
BUN: 12 mg/dL (ref 7–25)
CO2: 32 mmol/L (ref 20–32)
Calcium: 9.6 mg/dL (ref 8.6–10.4)
Chloride: 104 mmol/L (ref 98–110)
Creat: 0.9 mg/dL (ref 0.60–0.93)
Globulin: 2.5 g/dL (calc) (ref 1.9–3.7)
Glucose, Bld: 118 mg/dL — ABNORMAL HIGH (ref 65–99)
Potassium: 3.9 mmol/L (ref 3.5–5.3)
Sodium: 142 mmol/L (ref 135–146)
Total Bilirubin: 0.4 mg/dL (ref 0.2–1.2)
Total Protein: 6.7 g/dL (ref 6.1–8.1)

## 2019-11-25 LAB — TSH: TSH: 0.85 mIU/L (ref 0.40–4.50)

## 2019-11-25 LAB — VITAMIN D 25 HYDROXY (VIT D DEFICIENCY, FRACTURES): Vit D, 25-Hydroxy: 24 ng/mL — ABNORMAL LOW (ref 30–100)

## 2019-11-25 LAB — T4, FREE: Free T4: 1.1 ng/dL (ref 0.8–1.8)

## 2019-11-25 LAB — VITAMIN B12: Vitamin B-12: 518 pg/mL (ref 200–1100)

## 2019-11-25 NOTE — Assessment & Plan Note (Signed)
Checking labs including thyroid, vitamin D/B12, CBC, CMP to rule out metabolic cause. If no cause found will reduce AM metoprolol to 1/2 pill and keep evening metoprolol 1 pill.

## 2019-12-07 DIAGNOSIS — M25512 Pain in left shoulder: Secondary | ICD-10-CM | POA: Diagnosis not present

## 2019-12-07 DIAGNOSIS — M25511 Pain in right shoulder: Secondary | ICD-10-CM | POA: Diagnosis not present

## 2019-12-14 ENCOUNTER — Ambulatory Visit: Payer: Medicare PPO | Admitting: Gastroenterology

## 2019-12-20 ENCOUNTER — Ambulatory Visit: Payer: Medicare PPO | Attending: Internal Medicine

## 2019-12-20 DIAGNOSIS — Z23 Encounter for immunization: Secondary | ICD-10-CM

## 2019-12-20 NOTE — Progress Notes (Signed)
   Covid-19 Vaccination Clinic  Name:  Angela [name]    MRN: [id number] DOB: 07-08-1945  12/20/2019  Ms. Mode was observed post Covid-19 immunization for 15 minutes without incident. She was provided with Vaccine Information Sheet and instruction to access the V-Safe system.   [name] was instructed to call 911 with any severe reactions post vaccine: Marland Kitchen Difficulty breathing  . Swelling of face and throat  . A fast heartbeat  . A bad rash all over body  . Dizziness and weakness

## 2020-01-04 DIAGNOSIS — M25511 Pain in right shoulder: Secondary | ICD-10-CM | POA: Diagnosis not present

## 2020-01-12 DIAGNOSIS — M25511 Pain in right shoulder: Secondary | ICD-10-CM | POA: Diagnosis not present

## 2020-01-15 NOTE — Progress Notes (Signed)
01/15/2020 Angela [name] [id number] 11/19/1945   Chief Complaint: GERD follow up, abdominal pain  History of Present Illness: Angela [name] is a 74 year old female with a past medical history of  fibromyalgia, migraine headache, carotid artery hyperplasia, GERD, esophageal stricture, gastroparesis, hiatal hernia, diverticulitis 05/2015, IBS and colon polyps.  Past cholecystectomy C-section, tubal ligation and hysterectomy. She is followed by Dr. Christella Hartigan.  She underwent a virtual televisit with Dr. Christella Hartigan on 10/27/2018 due to having complaints of chronic right mid and LUQ pain with abdominal bloat.  She was assessed to most likely have adhesions which may be triggering her abdominal discomfort, she was prescribed Gas-X 1 pill with each meal and to call our office if her symptoms persisted or worsened.   She presents today for her annual GI follow-up.  She has heartburn and burping once to twice weekly.  No dysphagia.  Fried foods worsen her symptoms.  She had one episode of epigastric pain which radiated between her breasts which occurred in mid July 2021.  She drank ginger ale and belched and her epigastric/chest pain resolved.  No further epigastric or chest pain since that time. Infrequent right mid abdominal pain. She is taking Esomeprazole 40 mg twice daily and Famotidine 20 mg at bedtime. She underwent an EGD in 2013 which showed mild gastritis and a hiatal hernia.  She has a history of diverticulitis.  Her most recent episode of sigmoid diverticulitis was 05/16/2015 which required hospital admission. An abd/pelvic CT 05/16/2015 confirmed sigmoid diverticulitis without a perforation or abscess. She received IV Cipro and Flagyl then discharged home on oral and resolved after taking Cipro and Flagyl to complete a total of 10 days.  She reported having 2-3 episodes of diverticulitis in her lifetime.  She last had noticeable left lower quadrant abdominal pain approximately 1 year ago which lasted for  less than 1 day and resolved without seeking medical attention.  Her most recent colonoscopy was in 2013 by Dr. Jarold Motto which showed diverticulosis throughout the colon, no polyps.  She is passing normal formed [place] bowel movement daily as long as she takes Cholestyramine.  If she skips Cholestyramine for several days she develops diarrhea or constipation.  She reports seeing a small amount of bright red blood on the toilet tissue once monthly which usually occurs if she has had a looser stool.  She has less abdominal bloat. She takes aspirin 81 mg daily.  She takes Meloxicam 15 mg once daily for arthritis for the past 2 to 3 years.  No family history of esophageal or gastric cancer.  She stated her maternal aunt had intestinal cancer.  She is followed by cardiologist Dr. Jens Som for history of hypertension and palpitations which are well controlled on metoprolol.  No history of coronary artery disease.  History of carotid artery hyperplasia followed by vascular specialist at [facility].  Her most recent carotid Doppler was 06/2019 which demonstrated stable 60 to 79% right ICA stenosis with antegrade vertebral flow and 40 to 59% left ICA stenosis with antegrade vertebral flow.  The brachial pressures were symmetric and normal.   CBC Latest Ref Rng & Units 11/24/2019 08/22/2019 09/21/2017  WBC 3.8 - 10.8 Thousand/uL 6.2 7.2 6.1  Hemoglobin 11.7 - 15.5 g/dL 54.6 50.3 54.6  Hematocrit 35 - 45 % 37.3 36.0 35.8(L)  Platelets 140 - 400 Thousand/uL 286 286 311.0   CMP Latest Ref Rng & Units 11/24/2019 08/22/2019 09/21/2017  Glucose 65 - 99 mg/dL 568(L)  132(H) 98  BUN 7 - 25 mg/dL 12 13 13   Creatinine 0.60 - 0.93 mg/dL 4.70 9.62  Sodium 135 - 146 mmol/L 142 138 141  Potassium 3.5 - 5.3 mmol/L 3.9 3.7 3.5  Chloride 98 - 110 mmol/L 104 104 105  CO2 20 - 32 mmol/L 32 25 28  Calcium 8.6 - 10.4 mg/dL 9.6 9.2 9.5  Total Protein 6.1 - 8.1 g/dL 6.7 7.3 6.7  Total Bilirubin 0.2 - 1.2  mg/dL 0.4 0.6 0.4  Alkaline Phos 38 - 126 U/L - 66 77  AST 10 - 35 U/L 30 53(H) 19  ALT 6 - 29 U/L 30(H) 39 20   Colonoscopy 10/27/2011: -Diverticulosis throughout the colon -Excellent bowel prep -Recall colonoscopy 10 years  EGD 10/27/2011: -Mild gastritis in the antrum -Hiatal hernia -Normal duodenal folds -Normal esophagus  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   Fibromyalgia    Gastroparesis    GERD (gastroesophageal reflux disease) 09/13/2007   Glaucoma    Hiatal hernia 04/25/2005   Hyperlipidemia    Hypertension    Hypothyroidism    IBS (irritable bowel syndrome)    Migraine    Osteoarthritis    Other and unspecified noninfectious gastroenteritis and colitis(558.9)    Sessile colonic polyp 05/30/2009   Vertigo    Vitamin D deficiency      Current Outpatient Medications on File Prior to Visit  Medication Sig Dispense Refill   AMBULATORY NON FORMULARY MEDICATION Medication Name: FDgard samples given #32 lot number [id number]  Exp. 12/2016  gm (Patient taking differently: Take 1 tablet by mouth 2 (two) times daily. Medication Name: FDgard samples given #32 lot number [id number]  Exp. 12/2016  gm) 32 capsule 0   aspirin 81 MG tablet Take 81 mg by mouth daily.       cholestyramine (QUESTRAN) 4 g packet Take 1 packet (4 g total) by mouth daily. 60 each 5   desonide (DESOWEN) 0.05 % cream Apply topically 2 (two) times daily. 30 g 0   esomeprazole (NEXIUM) 40 MG capsule TAKE 1 CAPSULE BY MOUTH 2 TIMES DAILY BEFORE A MEAL. 60 capsule 5   famotidine (PEPCID) 20 MG tablet Take 1 tablet (20 mg total) by mouth at bedtime. 90  tablet 1   fluocinonide gel (LIDEX) 0.05 % fluocinonide 0.05 % topical gel  APPLY 4-5 APPLICATIONS DAILY AS NEEDED     ketoconazole (NIZORAL) 2 % cream Apply 1 application topically daily as needed for irritation.     latanoprost (XALATAN) 0.005 % ophthalmic solution latanoprost 0.005 % eye drops  PLACE 1 DROP IN BOTH EYES AT BEDTIME     levothyroxine (SYNTHROID) 50 MCG tablet TAKE 0.5 TABLETS (25 MCG TOTAL) BY MOUTH DAILY. 45 tablet 2   loratadine (CLARITIN) 10 MG tablet Allergy Relief (loratadine) 10 mg tablet  TAKE 1 TABLET EVERY DAY AS NEEDED FOR ALLEGIES     meloxicam (MOBIC) 15 MG tablet Take 15 mg by mouth daily.     metoprolol tartrate (LOPRESSOR) 50 MG tablet TAKE 1 TABLET BY MOUTH TWICE A DAY 180 tablet 3   mometasone (NASONEX) 50 MCG/ACT nasal  spray Place 2 sprays into the nose daily. 17 g 0   NON FORMULARY FD Guard     PARoxetine (PAXIL) 10 MG tablet Paxil 10 mg tablet  Take 1 tablet every day by oral route.     traMADol (ULTRAM) 50 MG tablet Take 50 mg by mouth every 12 (twelve) hours as needed. for pain  0   triamcinolone cream (KENALOG) 0.1 % Apply 1 application topically 2 (two) times daily as needed (for breakout).      valACYclovir (VALTREX) 500 MG tablet valacyclovir hcl 500 mg tabs     No current facility-administered medications on file prior to visit.   Allergies  Allergen Reactions   Shellfish Allergy Anaphylaxis   Tetracycline Other (See Comments)    GI bleed   Effexor Xr [Venlafaxine Hcl] Other (See Comments)    Nightmare and vivid dreams   Codeine Nausea And Vomiting and Other (See Comments)    GI upset/vomit   Paroxetine Rash      Current Medications, Allergies, Past Medical History, Past Surgical History, Family History and Social History were reviewed in [employer] record.   Review of Systems:   Constitutional: Negative for fever, sweats, chills or weight loss.  Respiratory: Negative for shortness of  breath.   Cardiovascular: See HPI. Gastrointestinal: See HPI.  Musculoskeletal: Negative for back pain or muscle aches.  Neurological: Negative for dizziness, headaches or paresthesias.    Physical Exam: BP 120/70 (BP Location: Left Arm, Patient Position: Sitting, Cuff Size: Normal)    Pulse 75    Ht 5\' 1"  (1.549 m)    Wt 175 lb (79.4 kg)    SpO2 98%    BMI 33.07 kg/m   General: Well developed 74 year old female in no acute distress. Head: Normocephalic and atraumatic. Eyes: No scleral icterus. Conjunctiva pink . Ears: Normal auditory acuity. Mouth: Few missing teeth.  No ulcers or lesions.  Lungs: Clear throughout to auscultation. Heart: Regular rate and rhythm, no murmur. Abdomen: Soft, nontender and nondistended. Mild tenderness right mid abdomen and above the umbilicus. No lower abdominal tenderness. No masses or hepatomegaly. Normal bowel sounds x 4 quadrants. Large RUQ scar. Thick horizontal scar across the lower abdomen.  Rectal: Deferred.  Musculoskeletal: Symmetrical with no gross deformities. Extremities: No edema. Neurological: Alert oriented x 4. No focal deficits.  Psychological: Alert and cooperative. Normal mood and affect  Assessment and Recommendations:  27. 74 year old female with GERD. Chronic ASA and Meloxicam -Continue Esomeprazole 40 mg p.o. twice daily and Famotidine 20 mg nightly for now -Reviewed GERD diet -EGD benefits and risks discussed including risk with sedation, risk of bleeding, perforation and infection   2. History of diverticulitis 05/2015 confirmed by CTAP. Her last colonoscopy was in 2013 which showed diverticulosis without colon polyps. -Due to her history of diverticulitis in 2017 I have recommended a colonoscopy at this time and not waiting until 2023 which would be her 10-year recall. -Colonoscopy benefits and risks discussed including risk with sedation, risk of bleeding, perforation and infection.  3. Hepatic steatosis per CTAP 07/2017.  AST/ALT 30/30 on 11/24/2019 -Avoid weight gain -Check LFTs with PCP annually   Further follow-up to be determined after the above evaluation completed

## 2020-01-16 ENCOUNTER — Ambulatory Visit (INDEPENDENT_AMBULATORY_CARE_PROVIDER_SITE_OTHER): Payer: Medicare PPO | Admitting: Nurse Practitioner

## 2020-01-16 ENCOUNTER — Encounter: Payer: Self-pay | Admitting: Nurse Practitioner

## 2020-01-16 VITALS — BP 120/70 | HR 75 | Ht 61.0 in | Wt 175.0 lb

## 2020-01-16 DIAGNOSIS — K219 Gastro-esophageal reflux disease without esophagitis: Secondary | ICD-10-CM

## 2020-01-16 DIAGNOSIS — K5732 Diverticulitis of large intestine without perforation or abscess without bleeding: Secondary | ICD-10-CM

## 2020-01-16 MED ORDER — SUPREP BOWEL PREP KIT 17.5-3.13-1.6 GM/177ML PO SOLN
1.0000 | ORAL | 0 refills | Status: DC
Start: 2020-01-16 — End: 2020-04-18

## 2020-01-16 NOTE — Patient Instructions (Addendum)
If you are age 73 or older, your body mass index should be between 23-30. Your Body mass index is 33.07 kg/m. If this is out of the aforementioned range listed, please consider follow up with your Primary Care Provider.  If you are age 33 or younger, your body mass index should be between 19-25. Your Body mass index is 33.07 kg/m. If this is out of the aformentioned range listed, please consider follow up with your Primary Care Provider.   You have been scheduled for an endoscopy and colonoscopy. Please follow the written instructions given to you at your visit today. Please pick up your prep supplies at the pharmacy within the next 1-3 days. If you use inhalers (even only as needed), please bring them with you on the day of your procedure.  Please call our office if your developing worsening GERD symptoms or if lower abdominal pain recurs.  Thank you for entrusting me with your care and choosing [facility].  Alcide Evener, NP

## 2020-01-17 NOTE — Progress Notes (Signed)
I agree with the above note, plan 

## 2020-01-18 DIAGNOSIS — M25511 Pain in right shoulder: Secondary | ICD-10-CM | POA: Diagnosis not present

## 2020-01-30 DIAGNOSIS — H524 Presbyopia: Secondary | ICD-10-CM | POA: Diagnosis not present

## 2020-01-30 DIAGNOSIS — H43813 Vitreous degeneration, bilateral: Secondary | ICD-10-CM | POA: Diagnosis not present

## 2020-01-30 DIAGNOSIS — H401131 Primary open-angle glaucoma, bilateral, mild stage: Secondary | ICD-10-CM | POA: Diagnosis not present

## 2020-01-30 DIAGNOSIS — H2513 Age-related nuclear cataract, bilateral: Secondary | ICD-10-CM | POA: Diagnosis not present

## 2020-02-13 ENCOUNTER — Telehealth: Payer: Self-pay | Admitting: Internal Medicine

## 2020-02-13 DIAGNOSIS — G43809 Other migraine, not intractable, without status migrainosus: Secondary | ICD-10-CM

## 2020-02-13 NOTE — Telephone Encounter (Signed)
Referral ordered for neurology 

## 2020-02-13 NOTE — Addendum Note (Signed)
Addended by: Pincus Sanes on: 02/13/2020 04:24 PM   Modules accepted: Orders

## 2020-02-13 NOTE — Telephone Encounter (Signed)
Patient needs a referral for a neurologist, was seeing Karenann Cai, he is retiring  Indication: migraines  Also see a vascular surgeon at [facility] we get the updates

## 2020-02-15 ENCOUNTER — Ambulatory Visit (AMBULATORY_SURGERY_CENTER): Payer: Medicare PPO | Admitting: Gastroenterology

## 2020-02-15 ENCOUNTER — Encounter: Payer: Self-pay | Admitting: Gastroenterology

## 2020-02-15 ENCOUNTER — Other Ambulatory Visit: Payer: Self-pay

## 2020-02-15 VITALS — BP 106/64 | HR 62 | Temp 97.2°F | Resp 20 | Ht 61.0 in | Wt 175.0 lb

## 2020-02-15 DIAGNOSIS — K2970 Gastritis, unspecified, without bleeding: Secondary | ICD-10-CM | POA: Diagnosis not present

## 2020-02-15 DIAGNOSIS — K219 Gastro-esophageal reflux disease without esophagitis: Secondary | ICD-10-CM

## 2020-02-15 DIAGNOSIS — K2950 Unspecified chronic gastritis without bleeding: Secondary | ICD-10-CM | POA: Diagnosis not present

## 2020-02-15 DIAGNOSIS — K5732 Diverticulitis of large intestine without perforation or abscess without bleeding: Secondary | ICD-10-CM

## 2020-02-15 DIAGNOSIS — Z1211 Encounter for screening for malignant neoplasm of colon: Secondary | ICD-10-CM

## 2020-02-15 DIAGNOSIS — I10 Essential (primary) hypertension: Secondary | ICD-10-CM | POA: Diagnosis not present

## 2020-02-15 MED ORDER — SODIUM CHLORIDE 0.9 % IV SOLN: 500.0000 mL | Freq: Once | INTRAVENOUS | Status: DC

## 2020-02-15 NOTE — Patient Instructions (Signed)
YOU HAD AN ENDOSCOPIC PROCEDURE TODAY AT THE [facility]:   Refer to the procedure report that was given to you for any specific questions about what was found during the examination.  If the procedure report does not answer your questions, please call your gastroenterologist to clarify.  If you requested that your care partner not be given the details of your procedure findings, then the procedure report has been included in a sealed envelope for you to review at your convenience later.  YOU SHOULD EXPECT: Some feelings of bloating in the abdomen. Passage of more gas than usual.  Walking can help get rid of the air that was put into your GI tract during the procedure and reduce the bloating. If you had a lower endoscopy (such as a colonoscopy or flexible sigmoidoscopy) you may notice spotting of blood in your stool or on the toilet paper. If you underwent a bowel prep for your procedure, you may not have a normal bowel movement for a few days.  Please Note:  You might notice some irritation and congestion in your nose or some drainage.  This is from the oxygen used during your procedure.  There is no need for concern and it should clear up in a day or so.  SYMPTOMS TO REPORT IMMEDIATELY:   Following lower endoscopy (colonoscopy or flexible sigmoidoscopy):  Excessive amounts of blood in the stool  Significant tenderness or worsening of abdominal pains  Swelling of the abdomen that is new, acute  Fever of 100F or higher   Following upper endoscopy (EGD)  Vomiting of blood or coffee ground material  New chest pain or pain under the shoulder blades  Painful or persistently difficult swallowing  New shortness of breath  Fever of 100F or higher  Black, tarry-looking stools  For urgent or emergent issues, a gastroenterologist can be reached at any hour by calling [phone]. Do not use MyChart messaging for urgent concerns.    DIET:  We do recommend a small meal at first, but  then you may proceed to your regular diet.  Drink plenty of fluids but you should avoid alcoholic beverages for 24 hours.  MEDICATIONS: Continue present medications.  Please see handouts given to you by your recovery nurse.  ACTIVITY:  You should plan to take it easy for the rest of today and you should NOT DRIVE or use heavy machinery until tomorrow (because of the sedation medicines used during the test).    FOLLOW UP: Our staff will call the number listed on your records 48-72 hours following your procedure to check on you and address any questions or concerns that you may have regarding the information given to you following your procedure. If we do not reach you, we will leave a message.  We will attempt to reach you two times.  During this call, we will ask if you have developed any symptoms of COVID 19. If you develop any symptoms (ie: fever, flu-like symptoms, shortness of breath, cough etc.) before then, please call [phone].  If you test positive for Covid 19 in the 2 weeks post procedure, please call and report this information to us.    If any biopsies were taken you will be contacted by phone or by letter within the next 1-3 weeks.  Please call us at [phone] if you have not heard about the biopsies in 3 weeks.   Thank you for allowing us to provide for your healthcare needs today.   SIGNATURES/CONFIDENTIALITY: You   and/or your care partner have signed paperwork which will be entered into your electronic medical record.  These signatures attest to the fact that that the information above on your After Visit Summary has been reviewed and is understood.  Full responsibility of the confidentiality of this discharge information lies with you and/or your care-partner. 

## 2020-02-15 NOTE — Progress Notes (Signed)
Called to room to assist during endoscopic procedure.  Patient ID and intended procedure confirmed with present staff. Received instructions for my participation in the procedure from the performing physician.  

## 2020-02-15 NOTE — Op Note (Addendum)
[facility] Patient Name: Arshi Duarte Procedure Date: 02/15/2020 2:25 PM MRN: [id number] Endoscopist: Rachael Fee , MD Age: 74 Referring MD:  Date of Birth: 01-03-1946 Gender: Female Account #: [id number] Procedure:                Colonoscopy Indications:              Screening for colorectal malignant neoplasm, Medicines:                Monitored Anesthesia Care Procedure:                Pre-Anesthesia Assessment:                           - Prior to the procedure, a History and Physical                            was performed, and patient medications and                            allergies were reviewed. The patient's tolerance of                            previous anesthesia was also reviewed. The risks                            and benefits of the procedure and the sedation                            options and risks were discussed with the patient.                            All questions were answered, and informed consent                            was obtained. Prior Anticoagulants: The patient has                            taken no previous anticoagulant or antiplatelet                            agents. ASA Grade Assessment: II - A patient with                            mild systemic disease. After reviewing the risks                            and benefits, the patient was deemed in                            satisfactory condition to undergo the procedure.                           After obtaining informed consent, the colonoscope  was passed under direct vision. Throughout the                            procedure, the patient's blood pressure, pulse, and                            oxygen saturations were monitored continuously. The                            Colonoscope was introduced through the anus and                            advanced to the the cecum, identified by                            appendiceal orifice and  ileocecal valve. The                            colonoscopy was performed without difficulty. The                            patient tolerated the procedure well. The quality                            of the bowel preparation was good. The ileocecal                            valve, appendiceal orifice, and rectum were                            photographed. Scope In: 2:35:32 PM Scope Out: 2:46:03 PM Scope Withdrawal Time: 0 hours 5 minutes 2 seconds  Total Procedure Duration: 0 hours 10 minutes 31 seconds  Findings:                 Multiple small and large-mouthed diverticula were                            found in the entire colon.                           Small external hemorrhoids.                           The exam was otherwise without abnormality on                            direct and retroflexion views. Complications:            No immediate complications. Estimated blood loss:                            None. Estimated Blood Loss:     Estimated blood loss: none. Impression:               - Diverticulosis in the entire examined colon.                           -  Small external hemorrhoids.                           - The examination was otherwise normal on direct                            and retroflexion views.                           - No polyps or cancers. Recommendation:           - No need for further colon cancer screening. Rachael Fee, MD 02/15/2020 2:57:50 PM This report has been signed electronically.

## 2020-02-15 NOTE — Progress Notes (Signed)
1430 Robinul 0.1 mg IV given due large amount of secretions upon assessment.  MD made aware, vss  

## 2020-02-15 NOTE — Op Note (Signed)
[facility] Patient Name: Angela [name] Procedure Date: 02/15/2020 2:25 PM MRN: [id number] Endoscopist: Rachael Fee , MD Age: 74 Referring MD:  Date of Birth: 07/03/1945 Gender: Female Account #: [id number] Procedure:                Upper GI endoscopy Indications:              Suspected gastro-esophageal reflux disease Medicines:                Monitored Anesthesia Care Procedure:                Pre-Anesthesia Assessment:                           - Prior to the procedure, a History and Physical                            was performed, and patient medications and                            allergies were reviewed. The patient's tolerance of                            previous anesthesia was also reviewed. The risks                            and benefits of the procedure and the sedation                            options and risks were discussed with the patient.                            All questions were answered, and informed consent                            was obtained. Prior Anticoagulants: The patient has                            taken no previous anticoagulant or antiplatelet                            agents. ASA Grade Assessment: II - A patient with                            mild systemic disease. After reviewing the risks                            and benefits, the patient was deemed in                            satisfactory condition to undergo the procedure.                           After obtaining informed consent, the endoscope was  passed under direct vision. Throughout the                            procedure, the patient's blood pressure, pulse, and                            oxygen saturations were monitored continuously. The                            Endoscope was introduced through the mouth, and                            advanced to the second part of duodenum. The upper                            GI endoscopy  was accomplished without difficulty.                            The patient tolerated the procedure well. Scope In: Scope Out: Findings:                 Moderate inflammation characterized by erythema,                            friability and granularity was found in the gastric                            antrum. Biopsies were taken with a cold forceps for                            histology.                           The exam was otherwise without abnormality. Complications:            No immediate complications. Estimated blood loss:                            None. Estimated Blood Loss:     Estimated blood loss: none. Impression:               - Moderate, non-specific distal gastritis. Biopsied                            to check for H. pylori.                           - The examination was otherwise normal. Recommendation:           - Await pathology results.                           - Patient has a contact number available for                            emergencies. The signs and symptoms of potential  delayed complications were discussed with the                            patient. Return to normal activities tomorrow.                            Written discharge instructions were provided to the                            patient.                           - Resume previous diet.                           - Continue present medications. Rachael Fee, MD 02/15/2020 3:03:02 PM This report has been signed electronically.

## 2020-02-15 NOTE — Progress Notes (Signed)
Report given to PACU, vss 

## 2020-02-17 ENCOUNTER — Telehealth: Payer: Self-pay | Admitting: *Deleted

## 2020-02-17 ENCOUNTER — Telehealth: Payer: Self-pay

## 2020-02-17 NOTE — Telephone Encounter (Signed)
Covid-19 screening questions   Do you now or have you had a fever in the last 14 days? no  Do you have any respiratory symptoms of shortness of breath or cough now or in the last 14 days? no  Do you have any family members or close contacts with diagnosed or suspected Covid-19 in the past 14 days? no  Have you been tested for Covid-19 and found to be positive? no       Follow up Call-  Call back number 02/15/2020  Post procedure Call Back phone  # [phone]  Permission to leave phone message Yes  Some recent data might be hidden     Patient questions:  Do you have a fever, pain , or abdominal swelling? No. Pain Score  0 *  Have you tolerated food without any problems? Yes.    Have you been able to return to your normal activities? Yes.    Do you have any questions about your discharge instructions: Diet   No. Medications  No. Follow up visit  No.  Do you have questions or concerns about your Care? No.  Actions: * If pain score is 4 or above: No action needed, pain <4.

## 2020-02-17 NOTE — Telephone Encounter (Signed)
  Follow up Call-  Call back number 02/15/2020  Post procedure Call Back phone  # [phone]  Permission to leave phone message Yes  Some recent data might be hidden     Patient questions:  Do you have a fever, pain , or abdominal swelling? No. Pain Score  0 *  Have you tolerated food without any problems? Yes.    Have you been able to return to your normal activities? Yes.    Do you have any questions about your discharge instructions: Diet   No. Medications  No. Follow up visit  No.  Do you have questions or concerns about your Care? No.  Actions: * If pain score is 4 or above: No action needed, pain <4.  1. Have you developed a fever since your procedure? No  2.   Have you had an respiratory symptoms (SOB or cough) since your procedure? No 3.   Have you tested positive for COVID 19 since your procedure No  4.   Have you had any family members/close contacts diagnosed with the COVID 19 since your procedure?  No  If yes to any of these questions please route to Laverna Peace, RN and Karlton Lemon, RN

## 2020-02-22 ENCOUNTER — Encounter: Payer: Self-pay | Admitting: Gastroenterology

## 2020-02-27 NOTE — Progress Notes (Signed)
HPI: FU palpitations. Echo Sept 2011 showed normal LV function. A CardioNet was performed and revealed sinus rhythm with PVCs. Palpitations treated with beta blocker. Stress echocardiogram April 2015 normal. 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 Stenosis not changed compared to previous. MRI/MRA July 2018 normal.  Since I last saw hershe has dyspnea with more vigorous activities but not routine activities.  No orthopnea, PND, pedal edema, exertional chest pain or syncope.  Occasional palpitations.  Note she can walk at least 1 mile with no dyspnea or chest pain.  She will have shoulder surgery in January and we were asked to evaluate preoperatively.  Current Outpatient Medications  Medication Sig Dispense Refill  . AMBULATORY NON FORMULARY MEDICATION Medication Name: FDgard samples given #32 lot number [id number]  Exp. 12/2016  gm (Patient taking differently: Take 1 tablet by mouth 2 (two) times daily. Medication Name: FDgard samples given #32 lot number [id number]  Exp. 12/2016  gm) 32 capsule 0  . aspirin 81 MG tablet Take 81 mg by mouth daily.      . cholestyramine (QUESTRAN) 4 g packet Take 1 packet (4 g total) by mouth daily. 60 each 5  . desonide (DESOWEN) 0.05 % cream Apply topically 2 (two) times daily. 30 g 0  . esomeprazole (NEXIUM) 40 MG capsule TAKE 1 CAPSULE BY MOUTH 2 TIMES DAILY BEFORE A MEAL. 60 capsule 5  . famotidine (PEPCID) 20 MG tablet Take 1 tablet (20 mg total) by mouth at bedtime. 90 tablet 1  . fluocinonide gel (LIDEX) 0.05 % fluocinonide 0.05 % topical gel  APPLY 4-5 APPLICATIONS DAILY AS NEEDED    . ketoconazole (NIZORAL) 2 % cream Apply 1 application topically daily as needed for irritation.     Marland Kitchen latanoprost (XALATAN) 0.005 % ophthalmic solution latanoprost 0.005 % eye drops  PLACE 1 DROP IN BOTH EYES AT BEDTIME    . levothyroxine (SYNTHROID) 50 MCG tablet TAKE 0.5 TABLETS (25 MCG TOTAL) BY MOUTH DAILY. 45 tablet 2  . loratadine (CLARITIN)  10 MG tablet Allergy Relief (loratadine) 10 mg tablet  TAKE 1 TABLET EVERY DAY AS NEEDED FOR ALLEGIES    . meloxicam (MOBIC) 15 MG tablet Take 15 mg by mouth daily.    . metoprolol tartrate (LOPRESSOR) 50 MG tablet TAKE 1 TABLET BY MOUTH TWICE A DAY (Patient taking differently: Take by mouth. TAKE 25 MG IN THE MORNING AND 50 MG AT EVENING.) 180 tablet 3  . mometasone (NASONEX) 50 MCG/ACT nasal spray Place 2 sprays into the nose daily. 17 g 0  . Na Sulfate-K Sulfate-Mg Sulf (SUPREP BOWEL PREP KIT) 17.5-3.13-1.6 GM/177ML SOLN Take 1 kit by mouth as directed. 324 mL 0  . NON FORMULARY FD Guard    . PARoxetine (PAXIL) 10 MG tablet Paxil 10 mg tablet  Take 1 tablet every day by oral route.    . traMADol (ULTRAM) 50 MG tablet Take 50 mg by mouth every 12 (twelve) hours as needed. for pain  0  . triamcinolone cream (KENALOG) 0.1 % Apply 1 application topically 2 (two) times daily as needed (for breakout).     . valACYclovir (VALTREX) 500 MG tablet valacyclovir hcl 500 mg tabs     Current Facility-Administered Medications  Medication Dose Route Frequency Provider Last Rate Last Admin  . 0.9 %  sodium chloride infusion  500 mL Intravenous Once Milus Banister, MD         Past Medical History:  Diagnosis Date  .  Acne rosacea   . Anxiety   . Bile salt-induced diarrhea   . Colon polyps   . Degenerative disc disease   . Diverticulitis 05/2015  . Diverticulosis 05/10/2004  . Duodenitis 04/25/2005  . Esophageal stricture 09/13/2007  . Esophagitis 04/25/2005  . External hemorrhoids 05/10/2004  . 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 Fibromyalgia   . Gastroparesis   . GERD (gastroesophageal reflux disease) 09/13/2007  . Glaucoma   . Hiatal hernia 04/25/2005  . Hyperlipidemia   . Hypertension   . Hypothyroidism   . IBS (irritable bowel syndrome)   . Migraine   . Osteoarthritis   . Other and unspecified noninfectious gastroenteritis and colitis(558.9)   . Sessile  colonic polyp 05/30/2009  . Vertigo   . Vitamin D deficiency     Past Surgical History:  Procedure Laterality Date  . BUNIONECTOMY  09/04/2011   left  . CESAREAN SECTION    . CHOLECYSTECTOMY  1985  . COLONOSCOPY    . FOOT OSTEOTOMY  2006   Right foot  . TUBAL LIGATION  1983  . VAGINAL HYSTERECTOMY  1993    Social History   Socioeconomic History  . Marital status: Married    Spouse name: Not on file  . Number of children: 2  . Years of education: Not on file  . Highest education level: Not on file  Occupational History  . Occupation: Retired  Tobacco Use  . Smoking status: Never Smoker  . Smokeless tobacco: Never Used  Vaping Use  . Vaping Use: Never used  Substance and Sexual Activity  . Alcohol use: No  . Drug use: No  . Sexual activity: Yes  Other Topics Concern  . Not on file  Social History Narrative  . Not on file   Social Determinants of Health   Financial Resource Strain:   . Difficulty of Paying Living Expenses: Not on file  Food Insecurity:   . Worried About Charity fundraiser in the Last Year: Not on file  . Ran Out of Food in the Last Year: Not on file  Transportation Needs:   . Lack of Transportation (Medical): Not on file  . Lack of Transportation (Non-Medical): Not on file  Physical Activity:   . Days of Exercise per Week: Not on file  . Minutes of Exercise per Session: Not on file  Stress:   . Feeling of Stress : Not on file  Social Connections:   . Frequency of Communication with Friends and Family: Not on file  . Frequency of Social Gatherings with Friends and Family: Not on file  . Attends Religious Services: Not on file  . Active Member of Clubs or Organizations: Not on file  . Attends Archivist Meetings: Not on file  . Marital Status: Not on file  Intimate Partner Violence:   . Fear of Current or Ex-Partner: Not on file  . Emotionally Abused: Not on file  . Physically Abused: Not on file  . Sexually Abused: Not on file     Family History  Problem Relation Age of Onset  . Mitral valve prolapse Mother   . Hypertension Father   . Colon polyps Father   . Stroke Father   . Hypertension Sister        x2  . Diabetes Sister   . Colon cancer Maternal Aunt   . Stomach cancer Maternal Aunt   . Colon polyps Sister   . Pancreatic cancer Neg  Hx     ROS: no fevers or chills, productive cough, hemoptysis, dysphasia, odynophagia, melena, hematochezia, dysuria, hematuria, rash, seizure activity, orthopnea, PND, pedal edema, claudication. Remaining systems are negative.  Physical Exam: Well-developed well-nourished in no acute distress.  Skin is warm and dry.  HEENT is normal.  Neck is supple.  Chest is clear to auscultation with normal expansion.  Cardiovascular exam is regular rate and rhythm.  Abdominal exam nontender or distended. No masses palpated. Extremities show no edema. neuro grossly intact  A/P  1 palpitations-symptoms are reasonably well controlled and unchanged.  Continue beta-blocker.  2 hypertension-patient's blood pressure is controlled.  Continue present medications and follow.  3 history of fibromuscular dysplasia-carotids are followed at [facility].  4 preoperative evaluation prior to shoulder surgery-patient has good functional capacity and can walk 1 mile without having dyspnea or chest pain.  She does not require further testing prior to surgery.  Kirk Ruths, MD

## 2020-03-01 ENCOUNTER — Encounter: Payer: Self-pay | Admitting: Cardiology

## 2020-03-01 ENCOUNTER — Ambulatory Visit (INDEPENDENT_AMBULATORY_CARE_PROVIDER_SITE_OTHER): Payer: Medicare PPO | Admitting: Cardiology

## 2020-03-01 VITALS — BP 120/62 | HR 68 | Ht 61.0 in | Wt 176.0 lb

## 2020-03-01 DIAGNOSIS — Z01810 Encounter for preprocedural cardiovascular examination: Secondary | ICD-10-CM | POA: Diagnosis not present

## 2020-03-01 DIAGNOSIS — R002 Palpitations: Secondary | ICD-10-CM | POA: Diagnosis not present

## 2020-03-01 DIAGNOSIS — I10 Essential (primary) hypertension: Secondary | ICD-10-CM | POA: Diagnosis not present

## 2020-03-01 DIAGNOSIS — I773 Arterial fibromuscular dysplasia: Secondary | ICD-10-CM

## 2020-03-01 NOTE — Patient Instructions (Signed)

## 2020-03-29 ENCOUNTER — Telehealth: Payer: Self-pay | Admitting: Cardiology

## 2020-03-29 NOTE — Telephone Encounter (Signed)
   Primary Cardiologist: Olga Millers, MD  Chart reviewed as part of pre-operative protocol coverage. Given past medical history and time since last visit, based on ACC/AHA guidelines, Angela [name] would be at acceptable risk for the planned procedure without further cardiovascular testing.   Her aspirin may be held for 5-7 days prior to her surgery.  Please resume as soon as hemostasis is achieved.  I will route this recommendation to the requesting party via Epic fax function and remove from pre-op pool.  Please call with questions.  Thomasene Ripple. Abdo Denault NP-C    03/29/2020, 4:23 PM [facility] [address] Office [phone] Fax [phone]

## 2020-03-29 NOTE — Telephone Encounter (Signed)
   [facility] Pre-operative Risk Assessment    Request for surgical clearance:  1. What type of surgery is being performed? Right Shoulder Arthroscopy & rotator cuff repair   2. When is this surgery scheduled? 04/19/2020  3. What type of clearance is required (medical clearance vs. Pharmacy clearance to hold med vs. Both)? Both   4. Are there any medications that need to be held prior to surgery and how long?  Aspirin for 5-7 days prior to surgery  5. Practice name and name of physician performing surgery? [facility] & Dr. Erling Conte  6. What is your office phone number [phone]   7.   What is your office fax number [phone]  8.   Anesthesia type (None, local, MAC, general) ? General    Angela [name] 03/29/2020, 3:53 PM  _________________________________________________________________   (provider comments below)

## 2020-04-17 ENCOUNTER — Ambulatory Visit: Payer: Medicare PPO | Admitting: Internal Medicine

## 2020-04-18 ENCOUNTER — Ambulatory Visit: Payer: Medicare PPO | Admitting: Family

## 2020-04-18 ENCOUNTER — Other Ambulatory Visit: Payer: Self-pay | Admitting: Family

## 2020-04-18 ENCOUNTER — Other Ambulatory Visit: Payer: Self-pay

## 2020-04-18 ENCOUNTER — Ambulatory Visit: Payer: Medicare PPO | Admitting: Internal Medicine

## 2020-04-18 ENCOUNTER — Encounter: Payer: Self-pay | Admitting: Family

## 2020-04-18 VITALS — BP 112/74 | HR 70 | Temp 98.4°F | Ht 61.0 in | Wt 177.0 lb

## 2020-04-18 DIAGNOSIS — R062 Wheezing: Secondary | ICD-10-CM | POA: Diagnosis not present

## 2020-04-18 DIAGNOSIS — J309 Allergic rhinitis, unspecified: Secondary | ICD-10-CM

## 2020-04-18 MED ORDER — MOMETASONE FUROATE 50 MCG/ACT NA SUSP: 2.0000 | Freq: Every day | NASAL | 11 refills | Status: DC

## 2020-04-18 MED ORDER — LORATADINE 10 MG PO TABS: ORAL_TABLET | ORAL | 3 refills | Status: DC

## 2020-04-18 NOTE — Progress Notes (Signed)
Angela [name] is a 75 y.o. female with the following history as recorded in EpicCare:  Patient Active Problem List   Diagnosis Date Noted  . Lightheadedness 11/25/2019  . Cough 04/12/2019  . Suspected COVID-19 virus infection 11/25/2018  . Upper respiratory infection 12/29/2017  . Stress incontinence 10/23/2017  . Bacterial vaginosis 09/21/2017  . Atypical squamous cells of undetermined significance (ASC-US) on cervical Pap smear 09/21/2017  . Osteoarthritis   . IBS (irritable bowel syndrome)   . Hypertension   . Glaucoma   . Gastroparesis   . Colon polyps   . Bile salt-induced diarrhea   . Anxiety   . Acne rosacea   . Routine general medical examination at a health care facility 03/03/2017  . Abnormal CT of the head 11/10/2016  . Vertigo 11/09/2016  . Allergic rhinitis 02/07/2016  . Lichen of skin 09/05/2015  . Diverticulitis 05/16/2015  . Diarrhea 02/13/2015  . Hemorrhoid 02/13/2015  . Numbness and tingling in hands 05/15/2014  . Cerebrovascular disease 10/18/2012  . Arteriopathy, intracranial, fibromuscular dysplasia, chronic (HCC) 10/18/2012  . Fibromuscular hyperplasia of artery (HCC)   . Migraine headache 09/24/2011  . GERD (gastroesophageal reflux disease) 08/16/2010  . Hyperlipidemia 12/20/2009  . Hypothyroidism 07/20/2009  . Vitamin D deficiency 07/20/2009  . Esophageal stricture 09/13/2007  . Essential hypertension 05/14/2007  . ACNE ROSACEA 05/14/2007  . OSTEOARTHRITIS 05/14/2007  . CAROTID BRUIT 05/14/2007  . Fibromyalgia 03/30/2007  . Hiatal hernia 04/25/2005  . Esophagitis 04/25/2005  . Duodenitis 04/25/2005  . External hemorrhoids 05/10/2004  . Diverticulosis 05/10/2004    Current Outpatient Medications  Medication Sig Dispense Refill  . AMBULATORY NON FORMULARY MEDICATION Medication Name: FDgard samples given #32 lot number [id number]  Exp. 12/2016  gm (Patient taking differently: Take 1 tablet by mouth 2 (two) times daily. Medication Name: FDgard  samples given #32 lot number [id number]  Exp. 12/2016  gm) 32 capsule 0  . aspirin 81 MG tablet Take 81 mg by mouth daily.    . cholestyramine (QUESTRAN) 4 g packet Take 1 packet (4 g total) by mouth daily. 60 each 5  . desonide (DESOWEN) 0.05 % cream Apply topically 2 (two) times daily. 30 g 0  . esomeprazole (NEXIUM) 40 MG capsule TAKE 1 CAPSULE BY MOUTH 2 TIMES DAILY BEFORE A MEAL. 60 capsule 5  . famotidine (PEPCID) 20 MG tablet Take 1 tablet (20 mg total) by mouth at bedtime. 90 tablet 1  . fluocinonide gel (LIDEX) 0.05 % fluocinonide 0.05 % topical gel  APPLY 4-5 APPLICATIONS DAILY AS NEEDED    . ketoconazole (NIZORAL) 2 % cream Apply 1 application topically daily as needed for irritation.     Marland Kitchen latanoprost (XALATAN) 0.005 % ophthalmic solution latanoprost 0.005 % eye drops  PLACE 1 DROP IN BOTH EYES AT BEDTIME    . levothyroxine (SYNTHROID) 50 MCG tablet TAKE 0.5 TABLETS (25 MCG TOTAL) BY MOUTH DAILY. 45 tablet 2  . meloxicam (MOBIC) 15 MG tablet Take 15 mg by mouth daily.    . metoprolol tartrate (LOPRESSOR) 50 MG tablet TAKE 1 TABLET BY MOUTH TWICE A DAY (Patient taking differently: Take by mouth. TAKE 25 MG IN THE MORNING AND 50 MG AT EVENING.) 180 tablet 3  . NON FORMULARY FD Guard    . PARoxetine (PAXIL) 10 MG tablet Paxil 10 mg tablet  Take 1 tablet every day by oral route.    . traMADol (ULTRAM) 50 MG tablet Take 50 mg by mouth every 12 (twelve) hours as needed.  for pain  0  . triamcinolone cream (KENALOG) 0.1 % Apply 1 application topically 2 (two) times daily as needed (for breakout).     . valACYclovir (VALTREX) 500 MG tablet valacyclovir hcl 500 mg tabs    . loratadine (CLARITIN) 10 MG tablet TAKE 1 TABLET EVERY DAY AS NEEDED FOR ALLEGIES 90 tablet 3  . mometasone (NASONEX) 50 MCG/ACT nasal spray Place 2 sprays into the nose daily. 17 g 11   No current facility-administered medications for this visit.    Allergies: Shellfish allergy, Tetracycline, Effexor xr [venlafaxine hcl],  Codeine, and Paroxetine  Past Medical History:  Diagnosis Date  . Acne rosacea   . Anxiety   . Bile salt-induced diarrhea   . Colon polyps   . Degenerative disc disease   . Diverticulitis 05/2015  . Diverticulosis 05/10/2004  . Duodenitis 04/25/2005  . Esophageal stricture 09/13/2007  . Esophagitis 04/25/2005  . External hemorrhoids 05/10/2004  . 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 Fibromyalgia   . Gastroparesis   . GERD (gastroesophageal reflux disease) 09/13/2007  . Glaucoma   . Hiatal hernia 04/25/2005  . Hyperlipidemia   . Hypertension   . Hypothyroidism   . IBS (irritable bowel syndrome)   . Migraine   . Osteoarthritis   . Other and unspecified noninfectious gastroenteritis and colitis(558.9)   . Sessile colonic polyp 05/30/2009  . Vertigo   . Vitamin D deficiency     Past Surgical History:  Procedure Laterality Date  . BUNIONECTOMY  09/04/2011   left  . CESAREAN SECTION    . CHOLECYSTECTOMY  1985  . COLONOSCOPY    . FOOT OSTEOTOMY  2006   Right foot  . TUBAL LIGATION  1983  . VAGINAL HYSTERECTOMY  1993    Family History  Problem Relation Age of Onset  . Mitral valve prolapse Mother   . Hypertension Father   . Colon polyps Father   . Stroke Father   . Hypertension Sister        x2  . Diabetes Sister   . Colon cancer Maternal Aunt   . Stomach cancer Maternal Aunt   . Colon polyps Sister   . Pancreatic cancer Neg Hx     Social History   Tobacco Use  . Smoking status: Never Smoker  . Smokeless tobacco: Never Used  Substance Use Topics  . Alcohol use: No    Subjective:   Patient complaining of sensation of wheezing/ SOB x 2-3 months; "feels like I get winded easily." Discussed with cardiology in November- did not feel like cardiac source; does have history of allergies- took allergy shots for 15 years;  Has been off "Claritin" and "Nasonex" for a while; thinks that wheezing may have started in the same timeline that she stopped  her allergy medication;   Went to [facility] in May 2021 with similar symptoms; normal CXR at that time;    Objective:  Vitals:   04/18/20 1032  BP: 112/74  Pulse: 70  Temp: 98.4 F (36.9 C)  TempSrc: Oral  SpO2: 97%  Weight: 177 lb (80.3 kg)  Height: 5\' 1"  (1.549 m)    General: Well developed, well nourished, in no acute distress  Skin : Warm and dry.  Head: Normocephalic and atraumatic  Eyes: Sclera and conjunctiva clear; pupils round and reactive to light; extraocular movements intact  Ears: External normal; canals clear; tympanic membranes normal  Oropharynx: Pink, supple. No suspicious lesions  Neck: Supple without thyromegaly,  adenopathy  Lungs: Respirations unlabored; clear to auscultation bilaterally without wheeze, rales, rhonchi  CVS exam: normal rate and regular rhythm.  Neurologic: Alert and oriented; speech intact; face symmetrical; moves all extremities well; CNII-XII intact without focal deficit   Assessment:  1. Allergic rhinitis, unspecified seasonality, unspecified trigger   2. Wheezing     Plan:  Physical exam is reassuring; cardiology was not concerned about dyspnea; will re-start her allergy medication and she will follow-up in 2 months; ( she is having rotator cuff surgery tomorrow and will be unavailable for 4-6 weeks for follow-up here); may need to refer to asthma/ allergy; follow up to be determined.  This visit occurred during the SARS-CoV-2 public health emergency.  Safety protocols were in place, including screening questions prior to the visit, additional usage of staff PPE, and extensive cleaning of exam room while observing appropriate contact time as indicated for disinfecting solutions.      Return in about 2 months (around 06/16/2020).  No orders of the defined types were placed in this encounter.   Requested Prescriptions   Signed Prescriptions Disp Refills  . loratadine (CLARITIN) 10 MG tablet 90 tablet 3    Sig: TAKE 1 TABLET EVERY DAY AS  NEEDED FOR ALLEGIES  . mometasone (NASONEX) 50 MCG/ACT nasal spray 17 g 11    Sig: Place 2 sprays into the nose daily.

## 2020-04-18 NOTE — Telephone Encounter (Signed)
She and I talked about that most likely she would have to buy this OTC; she was comfortable with that; will not plan to send in alternative nasal spray at this time. Please make sure she is still comfortable with this plan.

## 2020-04-19 DIAGNOSIS — G8918 Other acute postprocedural pain: Secondary | ICD-10-CM | POA: Diagnosis not present

## 2020-04-19 DIAGNOSIS — M75121 Complete rotator cuff tear or rupture of right shoulder, not specified as traumatic: Secondary | ICD-10-CM | POA: Diagnosis not present

## 2020-04-19 DIAGNOSIS — M7541 Impingement syndrome of right shoulder: Secondary | ICD-10-CM | POA: Diagnosis not present

## 2020-04-19 HISTORY — PX: ROTATOR CUFF REPAIR: SHX139

## 2020-04-30 ENCOUNTER — Ambulatory Visit: Payer: Medicare PPO | Admitting: Neurology

## 2020-05-20 ENCOUNTER — Other Ambulatory Visit: Payer: Self-pay | Admitting: Internal Medicine

## 2020-05-24 ENCOUNTER — Other Ambulatory Visit: Payer: Self-pay | Admitting: Gastroenterology

## 2020-05-24 DIAGNOSIS — M25611 Stiffness of right shoulder, not elsewhere classified: Secondary | ICD-10-CM | POA: Diagnosis not present

## 2020-05-24 DIAGNOSIS — M25511 Pain in right shoulder: Secondary | ICD-10-CM | POA: Diagnosis not present

## 2020-05-24 DIAGNOSIS — R531 Weakness: Secondary | ICD-10-CM | POA: Diagnosis not present

## 2020-05-24 DIAGNOSIS — Z9889 Other specified postprocedural states: Secondary | ICD-10-CM | POA: Diagnosis not present

## 2020-05-30 DIAGNOSIS — M25611 Stiffness of right shoulder, not elsewhere classified: Secondary | ICD-10-CM | POA: Diagnosis not present

## 2020-05-30 DIAGNOSIS — Z9889 Other specified postprocedural states: Secondary | ICD-10-CM | POA: Diagnosis not present

## 2020-05-30 DIAGNOSIS — R531 Weakness: Secondary | ICD-10-CM | POA: Diagnosis not present

## 2020-05-30 DIAGNOSIS — M25511 Pain in right shoulder: Secondary | ICD-10-CM | POA: Diagnosis not present

## 2020-06-08 DIAGNOSIS — Z9889 Other specified postprocedural states: Secondary | ICD-10-CM | POA: Diagnosis not present

## 2020-06-08 DIAGNOSIS — R531 Weakness: Secondary | ICD-10-CM | POA: Diagnosis not present

## 2020-06-08 DIAGNOSIS — M25511 Pain in right shoulder: Secondary | ICD-10-CM | POA: Diagnosis not present

## 2020-06-08 DIAGNOSIS — M25611 Stiffness of right shoulder, not elsewhere classified: Secondary | ICD-10-CM | POA: Diagnosis not present

## 2020-06-13 DIAGNOSIS — M25611 Stiffness of right shoulder, not elsewhere classified: Secondary | ICD-10-CM | POA: Diagnosis not present

## 2020-06-13 DIAGNOSIS — M25511 Pain in right shoulder: Secondary | ICD-10-CM | POA: Diagnosis not present

## 2020-06-13 DIAGNOSIS — R531 Weakness: Secondary | ICD-10-CM | POA: Diagnosis not present

## 2020-06-13 DIAGNOSIS — Z9889 Other specified postprocedural states: Secondary | ICD-10-CM | POA: Diagnosis not present

## 2020-06-15 DIAGNOSIS — Z9889 Other specified postprocedural states: Secondary | ICD-10-CM | POA: Diagnosis not present

## 2020-06-15 DIAGNOSIS — R531 Weakness: Secondary | ICD-10-CM | POA: Diagnosis not present

## 2020-06-15 DIAGNOSIS — M25611 Stiffness of right shoulder, not elsewhere classified: Secondary | ICD-10-CM | POA: Diagnosis not present

## 2020-06-15 DIAGNOSIS — M25511 Pain in right shoulder: Secondary | ICD-10-CM | POA: Diagnosis not present

## 2020-06-18 DIAGNOSIS — R531 Weakness: Secondary | ICD-10-CM | POA: Diagnosis not present

## 2020-06-18 DIAGNOSIS — M25611 Stiffness of right shoulder, not elsewhere classified: Secondary | ICD-10-CM | POA: Diagnosis not present

## 2020-06-18 DIAGNOSIS — M25511 Pain in right shoulder: Secondary | ICD-10-CM | POA: Diagnosis not present

## 2020-06-18 DIAGNOSIS — Z9889 Other specified postprocedural states: Secondary | ICD-10-CM | POA: Diagnosis not present

## 2020-06-20 DIAGNOSIS — M25511 Pain in right shoulder: Secondary | ICD-10-CM | POA: Diagnosis not present

## 2020-06-20 DIAGNOSIS — M25611 Stiffness of right shoulder, not elsewhere classified: Secondary | ICD-10-CM | POA: Diagnosis not present

## 2020-06-20 DIAGNOSIS — Z9889 Other specified postprocedural states: Secondary | ICD-10-CM | POA: Diagnosis not present

## 2020-06-20 DIAGNOSIS — R531 Weakness: Secondary | ICD-10-CM | POA: Diagnosis not present

## 2020-06-22 DIAGNOSIS — I6523 Occlusion and stenosis of bilateral carotid arteries: Secondary | ICD-10-CM | POA: Diagnosis not present

## 2020-06-26 DIAGNOSIS — Z9889 Other specified postprocedural states: Secondary | ICD-10-CM | POA: Diagnosis not present

## 2020-06-26 DIAGNOSIS — M25611 Stiffness of right shoulder, not elsewhere classified: Secondary | ICD-10-CM | POA: Diagnosis not present

## 2020-06-26 DIAGNOSIS — M25511 Pain in right shoulder: Secondary | ICD-10-CM | POA: Diagnosis not present

## 2020-06-26 DIAGNOSIS — R531 Weakness: Secondary | ICD-10-CM | POA: Diagnosis not present

## 2020-06-28 ENCOUNTER — Ambulatory Visit: Payer: Medicare PPO | Admitting: Neurology

## 2020-06-28 ENCOUNTER — Encounter: Payer: Self-pay | Admitting: Neurology

## 2020-06-28 ENCOUNTER — Telehealth: Payer: Self-pay | Admitting: Neurology

## 2020-06-28 VITALS — BP 117/68 | HR 65 | Ht 62.0 in | Wt 180.0 lb

## 2020-06-28 DIAGNOSIS — R2689 Other abnormalities of gait and mobility: Secondary | ICD-10-CM

## 2020-06-28 DIAGNOSIS — R42 Dizziness and giddiness: Secondary | ICD-10-CM | POA: Diagnosis not present

## 2020-06-28 DIAGNOSIS — H93A1 Pulsatile tinnitus, right ear: Secondary | ICD-10-CM | POA: Diagnosis not present

## 2020-06-28 NOTE — Progress Notes (Addendum)
[facility]    Provider:  Dr Lucia GaskinsAhern Requesting Provider: Pincus SanesBurns, Stacy J, MD Primary Care Provider:  Myrlene Brokerrawford, Elizabeth A, MD  CC:  migraines  HPI:  Angela [name] is a 75 y.o. female here as requested by Pincus SanesBurns, Stacy J, MD for migraines.  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  Patient is here and says her migraines have resolved. She has not had migraines for 6 years. She has had migraines since the age of 75. Started with her periods. She had vomiting, she was hospitalized as well, she was really glad they went away. She follows with vascular for her carotid stenosis and has an appointment in a few weeks. She fell 3x last year none recently, she feels her balance is poor due to vertigo, the room spins, first thing in the morning when she gets up, she has it every so often, she has had it for years, not new and no changes but ongoing for years and repeated episodes. She has ringing in the ears at night when its quiet, it is pulsating, started last fall, happens every night. She also has high-pitched ringing. Her mother has an aneurysm, it was cerebral, she had surgery on the cerebral aneurysm. She has fallen, she feels her balance is poor, didn't lift foot on the last one and was not dizzy, another time she was throwing some garbage and when she swung it she went it. Last fall was 4 months ago. No other focal neurologic deficits, associated symptoms, inciting events or modifiable factors.  Reviewed notes, labs and imaging from outside physicians, which showed:  From a thorough review of records, medications tried that can be used in migraine management include: Tylenol, Celebrex, meclizine, Mobic, Medrol Dosepak, metoprolol, Zofran tablets and injections, prednisone  tablets, tramadol, Effexor,  Reviewed vascular notes, last seen March 2021, they reported a stable 60 to 79% right ICA stenosis and 40 to 59% on the left by carotid duplex.  They follow her annually for this.  MRI HEAD FINDINGS 10/2016  Brain: The midline structures are normal. No focal diffusion restriction to indicate acute infarct. No intraparenchymal hemorrhage. The brain parenchymal signal is normal. No mass lesion. No chronic microhemorrhage or cerebral amyloid angiopathy. No hydrocephalus, age advanced atrophy or lobar predominant volume loss. No dural abnormality or extra-axial collection.  Skull and upper cervical spine: The visualized skull base, calvarium, upper cervical spine and extracranial soft tissues are normal.  Sinuses/Orbits: No fluid levels or advanced mucosal thickening. No mastoid effusion. Normal orbits.  MRA HEAD FINDINGS 10/2016  Intracranial internal carotid arteries: Normal.  Anterior cerebral arteries: Normal.  Middle cerebral arteries: Normal.  Posterior communicating arteries: Present bilaterally.  Posterior cerebral arteries: Fetal origin of the right PCA. Normal left PCA.  Basilar artery: Normal.  Vertebral arteries: Left dominant. Normal.  Superior cerebellar arteries: Normal.  Anterior inferior cerebellar arteries: Normal.  Posterior inferior cerebellar arteries: Normal.  IMPRESSION: Normal MRI/MRA of the brain. (personally reviewed images and agree)   Review of Systems: Patient complains of symptoms per HPI as well as the following symptoms: falls. Pertinent negatives and positives per HPI. All others negative.   Social History   Socioeconomic History  . Marital status: Married    Spouse name: Not on file  . Number of children: 2  . Years of education: Not on file  . Highest education level: Not on file  Occupational History  . Occupation: Retired  Tobacco Use  . Smoking status: Never Smoker  . Smokeless  tobacco: Never Used  Vaping Use  . Vaping Use: Never used  Substance and Sexual Activity  . Alcohol use: No  . Drug use: No  . Sexual activity: Yes  Other Topics Concern  . Not on file  Social History Narrative   Lives at home with husband   Right handed   Caffeine: 2 cups/day, sweet tea   Social Determinants of Health   Financial Resource Strain: Not on file  Food Insecurity: Not on file  Transportation Needs: Not on file  Physical Activity: Not on file  Stress: Not on file  Social Connections: Not on file  Intimate Partner Violence: Not on file    Family History  Problem Relation Age of Onset  . Mitral valve prolapse Mother   . Aneurysm Mother        cerebral  . Hypertension Father   . Colon polyps Father   . Stroke Father   . Hypertension Sister        x2  . Diabetes Sister   . Colon cancer Maternal Aunt   . Stomach cancer Maternal Aunt   . Colon polyps Sister   . Pancreatic cancer Neg Hx   . Migraines Neg Hx     Past Medical History:  Diagnosis Date  . Acne rosacea   . Anxiety   . Bile salt-induced diarrhea   . Colon polyps   . Degenerative disc disease   . Diverticulitis 05/2015  . Diverticulosis 05/10/2004  . Duodenitis 04/25/2005  . Esophageal stricture 09/13/2007  . Esophagitis 04/25/2005  . External hemorrhoids 05/10/2004  . 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 Fibromyalgia   . Gastroparesis   . GERD (gastroesophageal reflux disease) 09/13/2007  . Glaucoma   . Hiatal hernia 04/25/2005  . Hyperlipidemia   . Hypertension   . Hypothyroidism   . IBS (irritable bowel syndrome)   . Migraine   . Osteoarthritis   . Other and unspecified noninfectious gastroenteritis and colitis(558.9)   . Sessile colonic polyp 05/30/2009  . Vertigo   . Vitamin D deficiency     Patient Active Problem List   Diagnosis Date Noted  . Lightheadedness 11/25/2019  . Cough 04/12/2019  . Suspected COVID-19 virus infection 11/25/2018  . Upper  respiratory infection 12/29/2017  . Stress incontinence 10/23/2017  . Bacterial vaginosis 09/21/2017  . Atypical squamous cells of undetermined significance (ASC-US) on cervical Pap smear 09/21/2017  . Osteoarthritis   . IBS (irritable bowel syndrome)   . Hypertension   . Glaucoma   . Gastroparesis   . Colon polyps   . Bile salt-induced diarrhea   . Anxiety   . Acne rosacea   . Routine general medical examination at a health care facility 03/03/2017  . Abnormal CT of the head 11/10/2016  . Vertigo 11/09/2016  . Allergic rhinitis 02/07/2016  . Lichen of skin 09/05/2015  . Diverticulitis 05/16/2015  . Diarrhea 02/13/2015  . Hemorrhoid 02/13/2015  . Numbness and tingling in hands 05/15/2014  . Cerebrovascular disease 10/18/2012  . Arteriopathy, intracranial, fibromuscular dysplasia, chronic (HCC) 10/18/2012  . Fibromuscular hyperplasia of artery (HCC)   . Migraine headache 09/24/2011  . GERD (gastroesophageal reflux disease) 08/16/2010  . Hyperlipidemia 12/20/2009  . Hypothyroidism 07/20/2009  . Vitamin D deficiency 07/20/2009  . Esophageal stricture 09/13/2007  . Essential hypertension 05/14/2007  . ACNE ROSACEA 05/14/2007  .  OSTEOARTHRITIS 05/14/2007  . CAROTID BRUIT 05/14/2007  . Fibromyalgia 03/30/2007  . Hiatal hernia 04/25/2005  . Esophagitis 04/25/2005  . Duodenitis 04/25/2005  . External hemorrhoids 05/10/2004  . Diverticulosis 05/10/2004    Past Surgical History:  Procedure Laterality Date  . BUNIONECTOMY  09/04/2011   left  . CESAREAN SECTION    . CHOLECYSTECTOMY  1985  . COLONOSCOPY    . FOOT OSTEOTOMY  2006   Right foot  . ROTATOR CUFF REPAIR Right 04/19/2020  . TUBAL LIGATION  1983  . VAGINAL HYSTERECTOMY  1993    Current Outpatient Medications  Medication Sig Dispense Refill  . AMBULATORY NON FORMULARY MEDICATION Medication Name: FDgard samples given #32 lot number [id number]  Exp. 12/2016  gm (Patient taking differently: Take 1 tablet by mouth 2 (two)  times daily. Medication Name: FDgard samples given #32 lot number [id number]  Exp. 12/2016  gm) 32 capsule 0  . aspirin 81 MG tablet Take 81 mg by mouth daily.    . cholestyramine (QUESTRAN) 4 g packet TAKE 1 PACKET (4 G TOTAL) BY MOUTH DAILY. 90 packet 2  . desonide (DESOWEN) 0.05 % cream Apply topically 2 (two) times daily. 30 g 0  . esomeprazole (NEXIUM) 40 MG capsule TAKE 1 CAPSULE BY MOUTH 2 TIMES DAILY BEFORE A MEAL. 60 capsule 5  . famotidine (PEPCID) 20 MG tablet Take 1 tablet (20 mg total) by mouth at bedtime. 90 tablet 1  . fluocinonide gel (LIDEX) 0.05 % fluocinonide 0.05 % topical gel  APPLY 4-5 APPLICATIONS DAILY AS NEEDED    . HYDROcodone-acetaminophen (NORCO/VICODIN) 5-325 MG tablet Take 1 tablet by mouth twice a day as needed for post op pain.    Marland Kitchen ketoconazole (NIZORAL) 2 % cream Apply 1 application topically daily as needed for irritation.     Marland Kitchen latanoprost (XALATAN) 0.005 % ophthalmic solution latanoprost 0.005 % eye drops  PLACE 1 DROP IN BOTH EYES AT BEDTIME    . levothyroxine (SYNTHROID) 50 MCG tablet TAKE 0.5 TABLETS (25 MCG TOTAL) BY MOUTH DAILY. 45 tablet 2  . loratadine (CLARITIN) 10 MG tablet TAKE 1 TABLET EVERY DAY AS NEEDED FOR ALLEGIES 90 tablet 3  . meloxicam (MOBIC) 15 MG tablet Take 15 mg by mouth daily.    . metoprolol tartrate (LOPRESSOR) 50 MG tablet TAKE 1 TABLET BY MOUTH TWICE A DAY (Patient taking differently: Take by mouth. TAKE 25 MG IN THE MORNING AND 50 MG AT EVENING.) 180 tablet 3  . mometasone (NASONEX) 50 MCG/ACT nasal spray Place 2 sprays into the nose daily. 17 g 11  . NON FORMULARY FD Guard    . PARoxetine (PAXIL) 10 MG tablet Paxil 10 mg tablet  Take 1 tablet every day by oral route.    . triamcinolone cream (KENALOG) 0.1 % Apply 1 application topically 2 (two) times daily as needed (for breakout).      No current facility-administered medications for this visit.    Allergies as of 06/28/2020 - Review Complete 06/28/2020  Allergen Reaction Noted   . Shellfish allergy Anaphylaxis 08/01/2019  . Tetracycline Other (See Comments)   . Effexor xr [venlafaxine hcl] Other (See Comments) 11/18/2016  . Codeine Nausea And Vomiting and Other (See Comments)   . Paroxetine Rash 03/27/2015    Vitals: BP 117/68 (BP Location: Left Arm, Patient Position: Sitting)   Pulse 65   Ht 5\' 2"  (1.575 m)   Wt 180 lb (81.6 kg)   BMI 32.92 kg/m  Last Weight:  Wt Readings from  Last 1 Encounters:  06/28/20 180 lb (81.6 kg)   Last Height:   Ht Readings from Last 1 Encounters:  06/28/20  (1.575 m)     Physical exam: Exam: Gen: NAD, conversant, well nourised, obese, well groomed                     CV: RRR, no MRG. +right Carotid Bruits. No peripheral edema, warm, nontender Eyes: Conjunctivae clear without exudates or hemorrhage  Neuro: Detailed Neurologic Exam  Speech:    Speech is normal; fluent and spontaneous with normal comprehension.  Cognition:    The patient is oriented to person, place, and time;     recent and remote memory intact;     language fluent;     normal attention, concentration,     fund of knowledge Cranial Nerves:    The pupils are equal, round, and reactive to light. The fundi are normal and spontaneous venous pulsations are present. Visual fields are full to finger confrontation. Extraocular movements are intact. Trigeminal sensation is intact and the muscles of mastication are normal. The face is symmetric. The palate elevates in the midline. Hearing intact. Voice is normal. Shoulder shrug is normal. The tongue has normal motion without fasciculations.   Coordination:    Normal finger to nose and heel to shin. Normal rapid alternating movements.   Gait:    Heel-toe and tandem gait are normal.   Motor Observation:    No asymmetry, no atrophy, and no involuntary movements noted. Tone:    Normal muscle tone.    Posture:    Posture is normal. normal erect    Strength: mild weakness right arm (due to rotator  cuff injury and surgery). Otherwise s trength is V/V in the upper and lower limbs.      Sensation: intact to LT     Reflex Exam:  DTR's:    Deep tendon reflexes in the upper and lower extremities are brisk bilaterally.   Toes:    The toes are downgoing bilaterally.   Clonus:    Clonus is absent.    Assessment/Plan:   75 y.o. female here as requested by Pincus Sanes, MD for migraines.  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  Lovely patient, has not had a migraine in 6 years.  She does report pulsatile tinnitus more in the right ear with a family history of cerebral aneurysm in her mother I think it would be wise just to check a CTA of the head and neck and make sure there are no aneurysms.  She does have a bruit on that side, she may be hearing that but we need to check.  BPPV: PT for vestibular therapy and also for imbalance.  Imbalance: This sounds mechanical, however her reflexes are brisk, I discussed an MRI of the cervical spine with patient, I told her I would order it but at this time she prefers to monitor it clinically, if she falls again or starts having imbalance not attributable to something mechanical will order MRI of the cervical spine. Discussed safety. Ordering PT for vestibular therapy and balance.   Follows with vascular has an appointment next month.  We will ask her to take her CD with a CTA head and neck on it with her to that appointment.  Orders Placed This Encounter  Procedures  . CT ANGIO NECK W OR  WO CONTRAST  . CT ANGIO HEAD W OR WO CONTRAST  . Basic Metabolic Panel  . Ambulatory referral to Physical Therapy   No orders of the defined types were placed in this encounter.   Cc: Pincus Sanes, MD,  Myrlene Broker, MD  Naomie Dean,  MD  [facility] [address] [place], [place] [address]  Phone [phone] Fax [phone]

## 2020-06-28 NOTE — Telephone Encounter (Signed)
[employer] pending uploaded notes on the portal  

## 2020-06-28 NOTE — Patient Instructions (Addendum)
CT of the blood vessels of the head and neck Lab test today Physical Therapy for vertigo ("vestibular therapy") and imbalance Recommend MRI cervical spine(think about it)   Fall Prevention in the Home, Adult Falls can cause injuries and can happen to people of all ages. There are many things you can do to make your home safe and to help prevent falls. Ask for help when making these changes. What actions can I take to prevent falls? General Instructions  Use good lighting in all rooms. Replace any light bulbs that burn out.  Turn on the lights in dark areas. Use night-lights.  Keep items that you use often in easy-to-reach places. Lower the shelves around your home if needed.  Set up your furniture so you have a clear path. Avoid moving your furniture around.  Do not have throw rugs or other things on the floor that can make you trip.  Avoid walking on wet floors.  If any of your floors are uneven, fix them.  Add color or contrast paint or tape to clearly mark and help you see: ? Grab bars or handrails. ? First and last steps of staircases. ? Where the edge of each step is.  If you use a stepladder: ? Make sure that it is fully opened. Do not climb a closed stepladder. ? Make sure the sides of the stepladder are locked in place. ? Ask someone to hold the stepladder while you use it.  Know where your pets are when moving through your home. What can I do in the bathroom?  Keep the floor dry. Clean up any water on the floor right away.  Remove soap buildup in the tub or shower.  Use nonskid mats or decals on the floor of the tub or shower.  Attach bath mats securely with double-sided, nonslip rug tape.  If you need to sit down in the shower, use a plastic, nonslip stool.  Install grab bars by the toilet and in the tub and shower. Do not use towel bars as grab bars.      What can I do in the bedroom?  Make sure that you have a light by your bed that is easy to  reach.  Do not use any sheets or blankets for your bed that hang to the floor.  Have a firm chair with side arms that you can use for support when you get dressed. What can I do in the kitchen?  Clean up any spills right away.  If you need to reach something above you, use a step stool with a grab bar.  Keep electrical cords out of the way.  Do not use floor polish or wax that makes floors slippery. What can I do with my stairs?  Do not leave any items on the stairs.  Make sure that you have a light switch at the top and the bottom of the stairs.  Make sure that there are handrails on both sides of the stairs. Fix handrails that are broken or loose.  Install nonslip stair treads on all your stairs.  Avoid having throw rugs at the top or bottom of the stairs.  Choose a carpet that does not hide the edge of the steps on the stairs.  Check carpeting to make sure that it is firmly attached to the stairs. Fix carpet that is loose or worn. What can I do on the outside of my home?  Use bright outdoor lighting.  Fix the edges of walkways and  driveways and fix any cracks.  Remove anything that might make you trip as you walk through a door, such as a raised step or threshold.  Trim any bushes or trees on paths to your home.  Check to see if handrails are loose or broken and that both sides of all steps have handrails.  Install guardrails along the edges of any raised decks and porches.  Clear paths of anything that can make you trip, such as tools or rocks.  Have leaves, snow, or ice cleared regularly.  Use sand or salt on paths during winter.  Clean up any spills in your garage right away. This includes grease or oil spills. What other actions can I take?  Wear shoes that: ? Have a low heel. Do not wear high heels. ? Have rubber bottoms. ? Feel good on your feet and fit well. ? Are closed at the toe. Do not wear open-toe sandals.  Use tools that help you move around  if needed. These include: ? Canes. ? Walkers. ? Scooters. ? Crutches.  Review your medicines with your doctor. Some medicines can make you feel dizzy. This can increase your chance of falling. Ask your doctor what else you can do to help prevent falls. Where to find more information  Centers for Disease Control and Prevention, [employer]AD[employer]: FootballExhibition.com.br  [employer] on Aging: https://walker.com/ Contact a doctor if:  You are afraid of falling at home.  You feel weak, drowsy, or dizzy at home.  You fall at home. Summary  There are many simple things that you can do to make your home safe and to help prevent falls.  Ways to make your home safe include removing things that can make you trip and installing grab bars in the bathroom.  Ask for help when making these changes in your home. This information is not intended to replace advice given to you by your health care provider. Make sure you discuss any questions you have with your health care provider. Document Revised: 11/02/2019 Document Reviewed: 11/02/2019 Elsevier Patient Education  2021 [employer].

## 2020-06-29 LAB — BASIC METABOLIC PANEL
BUN/Creatinine Ratio: 16 (ref 12–28)
BUN: 13 mg/dL (ref 8–27)
CO2: 23 mmol/L (ref 20–29)
Calcium: 9.7 mg/dL (ref 8.7–10.3)
Chloride: 103 mmol/L (ref 96–106)
Creatinine, Ser: 0.82 mg/dL (ref 0.57–1.00)
Glucose: 105 mg/dL — ABNORMAL HIGH (ref 65–99)
Potassium: 4 mmol/L (ref 3.5–5.2)
Sodium: 142 mmol/L (ref 134–144)
eGFR: 75 mL/min/{1.73_m2} (ref >59)

## 2020-07-02 NOTE — Telephone Encounter (Signed)
[name]: [id number] (exp. 06/28/20 to 07/28/20) order sent ot GI. The will reach out to the patient to schedule.

## 2020-07-04 DIAGNOSIS — M25611 Stiffness of right shoulder, not elsewhere classified: Secondary | ICD-10-CM | POA: Diagnosis not present

## 2020-07-04 DIAGNOSIS — Z9889 Other specified postprocedural states: Secondary | ICD-10-CM | POA: Diagnosis not present

## 2020-07-04 DIAGNOSIS — M25511 Pain in right shoulder: Secondary | ICD-10-CM | POA: Diagnosis not present

## 2020-07-04 DIAGNOSIS — R531 Weakness: Secondary | ICD-10-CM | POA: Diagnosis not present

## 2020-07-06 DIAGNOSIS — R531 Weakness: Secondary | ICD-10-CM | POA: Diagnosis not present

## 2020-07-06 DIAGNOSIS — Z9889 Other specified postprocedural states: Secondary | ICD-10-CM | POA: Diagnosis not present

## 2020-07-06 DIAGNOSIS — M25611 Stiffness of right shoulder, not elsewhere classified: Secondary | ICD-10-CM | POA: Diagnosis not present

## 2020-07-06 DIAGNOSIS — M25511 Pain in right shoulder: Secondary | ICD-10-CM | POA: Diagnosis not present

## 2020-07-11 DIAGNOSIS — R531 Weakness: Secondary | ICD-10-CM | POA: Diagnosis not present

## 2020-07-11 DIAGNOSIS — Z9889 Other specified postprocedural states: Secondary | ICD-10-CM | POA: Diagnosis not present

## 2020-07-11 DIAGNOSIS — M25511 Pain in right shoulder: Secondary | ICD-10-CM | POA: Diagnosis not present

## 2020-07-11 DIAGNOSIS — M25611 Stiffness of right shoulder, not elsewhere classified: Secondary | ICD-10-CM | POA: Diagnosis not present

## 2020-07-12 ENCOUNTER — Other Ambulatory Visit: Payer: Self-pay

## 2020-07-12 ENCOUNTER — Telehealth: Payer: Self-pay | Admitting: Neurology

## 2020-07-12 ENCOUNTER — Ambulatory Visit
Admission: RE | Admit: 2020-07-12 | Discharge: 2020-07-12 | Disposition: A | Discharge status: Home / Self Care | Payer: Medicare PPO | Source: Ambulatory Visit | Attending: Neurology | Admitting: Neurology

## 2020-07-12 DIAGNOSIS — H93A1 Pulsatile tinnitus, right ear: Secondary | ICD-10-CM

## 2020-07-12 DIAGNOSIS — I672 Cerebral atherosclerosis: Secondary | ICD-10-CM | POA: Diagnosis not present

## 2020-07-12 DIAGNOSIS — I6523 Occlusion and stenosis of bilateral carotid arteries: Secondary | ICD-10-CM | POA: Diagnosis not present

## 2020-07-12 DIAGNOSIS — R42 Dizziness and giddiness: Secondary | ICD-10-CM

## 2020-07-12 DIAGNOSIS — I773 Arterial fibromuscular dysplasia: Secondary | ICD-10-CM | POA: Diagnosis not present

## 2020-07-12 DIAGNOSIS — H93A9 Pulsatile tinnitus, unspecified ear: Secondary | ICD-10-CM | POA: Diagnosis not present

## 2020-07-12 MED ORDER — IOPAMIDOL (ISOVUE-370) INJECTION 76%
75.0000 mL | Freq: Once | INTRAVENOUS | Status: AC | PRN
  Administered 2020-07-12: 75 mL via INTRAVENOUS

## 2020-07-12 NOTE — Telephone Encounter (Signed)
Angela [name], CTA is stable but there is "Prominent soft tissue in the supraglottic larynx effacing the airway". Recommend direct visualization.She needs an appointment with ENT, they need to take a look and possibly do a biopsy. If she agrees I can place referral please let me know    IMPRESSION: 1. Fibromuscular dysplasia involving the cervical internal carotid arteries and vertebral arteries, similar to the prior MRA. No significant stenosis. 2. Mild intracranial atherosclerosis without significant stenosis. 3. Prominent soft tissue in the supraglottic larynx effacing the airway. Recommend direct visualization. 4. Unremarkable CT appearance of the brain. 5. Minor contrast reaction.

## 2020-07-12 NOTE — Telephone Encounter (Signed)
Spoke with the patient and discussed the results per Dr. Daisy Blossom of her CTA.  Patient aware the CTA was stable but they did note soft tissue that is in her upper throat that may be affecting her airway.  The patient verbalized understanding and noted that she has developed wheezing that started around last fall and she has never had this before. The patient is amenable to a referral to ENT for further evaluation and possible biopsy.  She verbalized appreciation for the call.

## 2020-07-13 ENCOUNTER — Other Ambulatory Visit: Payer: Self-pay | Admitting: Neurology

## 2020-07-13 DIAGNOSIS — M25611 Stiffness of right shoulder, not elsewhere classified: Secondary | ICD-10-CM | POA: Diagnosis not present

## 2020-07-13 DIAGNOSIS — Z9889 Other specified postprocedural states: Secondary | ICD-10-CM | POA: Diagnosis not present

## 2020-07-13 DIAGNOSIS — M25511 Pain in right shoulder: Secondary | ICD-10-CM | POA: Diagnosis not present

## 2020-07-13 DIAGNOSIS — J387 Other diseases of larynx: Secondary | ICD-10-CM

## 2020-07-13 DIAGNOSIS — R531 Weakness: Secondary | ICD-10-CM | POA: Diagnosis not present

## 2020-07-19 DIAGNOSIS — M25511 Pain in right shoulder: Secondary | ICD-10-CM | POA: Diagnosis not present

## 2020-07-19 DIAGNOSIS — R531 Weakness: Secondary | ICD-10-CM | POA: Diagnosis not present

## 2020-07-19 DIAGNOSIS — Z9889 Other specified postprocedural states: Secondary | ICD-10-CM | POA: Diagnosis not present

## 2020-07-19 DIAGNOSIS — M25611 Stiffness of right shoulder, not elsewhere classified: Secondary | ICD-10-CM | POA: Diagnosis not present

## 2020-07-24 ENCOUNTER — Ambulatory Visit: Payer: Medicare PPO

## 2020-07-25 DIAGNOSIS — R42 Dizziness and giddiness: Secondary | ICD-10-CM | POA: Insufficient documentation

## 2020-07-25 DIAGNOSIS — M25511 Pain in right shoulder: Secondary | ICD-10-CM | POA: Diagnosis not present

## 2020-07-25 DIAGNOSIS — I10 Essential (primary) hypertension: Secondary | ICD-10-CM | POA: Diagnosis not present

## 2020-07-25 DIAGNOSIS — R531 Weakness: Secondary | ICD-10-CM | POA: Diagnosis not present

## 2020-07-25 DIAGNOSIS — I6523 Occlusion and stenosis of bilateral carotid arteries: Secondary | ICD-10-CM | POA: Diagnosis not present

## 2020-07-25 DIAGNOSIS — I679 Cerebrovascular disease, unspecified: Secondary | ICD-10-CM | POA: Diagnosis not present

## 2020-07-25 DIAGNOSIS — Z9889 Other specified postprocedural states: Secondary | ICD-10-CM | POA: Diagnosis not present

## 2020-07-25 DIAGNOSIS — M25611 Stiffness of right shoulder, not elsewhere classified: Secondary | ICD-10-CM | POA: Diagnosis not present

## 2020-07-26 ENCOUNTER — Other Ambulatory Visit: Payer: Self-pay | Admitting: Gastroenterology

## 2020-07-27 ENCOUNTER — Other Ambulatory Visit: Payer: Self-pay | Admitting: Cardiology

## 2020-07-27 ENCOUNTER — Ambulatory Visit: Payer: Medicare PPO | Attending: Internal Medicine

## 2020-07-27 DIAGNOSIS — Z23 Encounter for immunization: Secondary | ICD-10-CM

## 2020-07-27 NOTE — Progress Notes (Signed)
   Covid-19 Vaccination Clinic  Name:  JARETZI DROZ    MRN: [id number] DOB: 08/03/1945  07/27/2020  Ms. Hersh was observed post Covid-19 immunization for 15 minutes without incident. She was provided with Vaccine Information Sheet and instruction to access the V-Safe system.   Ms. Smyser was instructed to call 911 with any severe reactions post vaccine: Marland Kitchen Difficulty breathing  . Swelling of face and throat  . A fast heartbeat  . A bad rash all over body  . Dizziness and weakness   Immunizations Administered    Name Date Dose VIS Date Route   PFIZER Comrnaty(Gray TOP) Covid-19 Vaccine 07/27/2020 12:03 PM 0.3 mL 03/22/2020 Intramuscular   Manufacturer: [employer], [employer]   Lot: [id number]   NDC: [phone]

## 2020-07-30 DIAGNOSIS — Z9889 Other specified postprocedural states: Secondary | ICD-10-CM | POA: Diagnosis not present

## 2020-07-30 DIAGNOSIS — M25511 Pain in right shoulder: Secondary | ICD-10-CM | POA: Diagnosis not present

## 2020-07-30 DIAGNOSIS — M25611 Stiffness of right shoulder, not elsewhere classified: Secondary | ICD-10-CM | POA: Diagnosis not present

## 2020-07-30 DIAGNOSIS — R531 Weakness: Secondary | ICD-10-CM | POA: Diagnosis not present

## 2020-08-01 ENCOUNTER — Ambulatory Visit (INDEPENDENT_AMBULATORY_CARE_PROVIDER_SITE_OTHER): Payer: Medicare PPO

## 2020-08-01 ENCOUNTER — Encounter: Payer: Self-pay | Admitting: Internal Medicine

## 2020-08-01 ENCOUNTER — Other Ambulatory Visit: Payer: Self-pay

## 2020-08-01 ENCOUNTER — Ambulatory Visit: Payer: Medicare PPO | Admitting: Internal Medicine

## 2020-08-01 VITALS — BP 130/68 | HR 75 | Temp 97.8°F | Resp 18 | Ht 62.0 in | Wt 182.0 lb

## 2020-08-01 DIAGNOSIS — R42 Dizziness and giddiness: Secondary | ICD-10-CM | POA: Diagnosis not present

## 2020-08-01 DIAGNOSIS — Z0000 Encounter for general adult medical examination without abnormal findings: Secondary | ICD-10-CM

## 2020-08-01 DIAGNOSIS — R0600 Dyspnea, unspecified: Secondary | ICD-10-CM | POA: Diagnosis not present

## 2020-08-01 DIAGNOSIS — R0602 Shortness of breath: Secondary | ICD-10-CM | POA: Diagnosis not present

## 2020-08-01 DIAGNOSIS — R930 Abnormal findings on diagnostic imaging of skull and head, not elsewhere classified: Secondary | ICD-10-CM

## 2020-08-01 DIAGNOSIS — I10 Essential (primary) hypertension: Secondary | ICD-10-CM

## 2020-08-01 DIAGNOSIS — R062 Wheezing: Secondary | ICD-10-CM | POA: Diagnosis not present

## 2020-08-01 DIAGNOSIS — I509 Heart failure, unspecified: Secondary | ICD-10-CM | POA: Diagnosis not present

## 2020-08-01 NOTE — Progress Notes (Signed)
Subjective:   Angela [name] is a 75 y.o. female who presents for Medicare Annual (Subsequent) preventive examination.  Review of Systems    No ROS. Medicare Wellness Visit. Additional risk factors are reflected in social history. Cardiac Risk Factors include: advanced age (>18men, >37 women);family history of premature cardiovascular disease;dyslipidemia;hypertension;obesity (BMI >30kg/m2)     Objective:    Today's Vitals   08/01/20 1100  BP: 130/68  Pulse: 75  Resp: 18  Temp: 97.8 F (36.6 C)  SpO2: 97%  Weight: 182 lb (82.6 kg)  Height: 5\' 2"  (1.575 m)   Body mass index is 33.29 kg/m.  Advanced Directives 08/01/2020 08/01/2019 07/30/2018 07/23/2017 11/10/2016 11/22/2015 05/16/2015  Does Patient Have a Medical Advance Directive? No No No No No No No  Would patient like information on creating a medical advance directive? No - Patient declined Yes (ED - Information included in AVS) No - Patient declined No - Patient declined Yes (Inpatient - patient requests chaplain consult to create a medical advance directive) Yes - Educational materials given No - patient declined information    Current Medications (verified) Outpatient Encounter Medications as of 08/01/2020  Medication Sig  . AMBULATORY NON FORMULARY MEDICATION Medication Name: FDgard samples given #32 lot number [id number]  Exp. 12/2016  gm (Patient taking differently: Take 1 tablet by mouth 2 (two) times daily. Medication Name: FDgard samples given #32 lot number [id number]  Exp. 12/2016  gm)  . aspirin 81 MG tablet Take 81 mg by mouth daily.  . cholestyramine (QUESTRAN) 4 g packet TAKE 1 PACKET (4 G TOTAL) BY MOUTH DAILY.  01/2017 desonide (DESOWEN) 0.05 % cream Apply topically 2 (two) times daily.  Marland Kitchen esomeprazole (NEXIUM) 40 MG capsule TAKE 1 CAPSULE BY MOUTH 2 TIMES DAILY BEFORE A MEAL.  . famotidine (PEPCID) 20 MG tablet Take 1 tablet (20 mg total) by mouth at bedtime.  . fluocinonide gel (LIDEX) 0.05 % fluocinonide 0.05 % topical  gel  APPLY 4-5 APPLICATIONS DAILY AS NEEDED  . HYDROcodone-acetaminophen (NORCO/VICODIN) 5-325 MG tablet Take 1 tablet by mouth twice a day as needed for post op pain. (Patient not taking: Reported on 08/01/2020)  . ketoconazole (NIZORAL) 2 % cream Apply 1 application topically daily as needed for irritation.   08/03/2020 latanoprost (XALATAN) 0.005 % ophthalmic solution latanoprost 0.005 % eye drops  PLACE 1 DROP IN BOTH EYES AT BEDTIME  . levothyroxine (SYNTHROID) 50 MCG tablet TAKE 0.5 TABLETS (25 MCG TOTAL) BY MOUTH DAILY.  Marland Kitchen loratadine (CLARITIN) 10 MG tablet TAKE 1 TABLET EVERY DAY AS NEEDED FOR ALLEGIES  . meloxicam (MOBIC) 15 MG tablet Take 15 mg by mouth daily.  . metoprolol tartrate (LOPRESSOR) 50 MG tablet TAKE 1 TABLET BY MOUTH TWICE A DAY  . mometasone (NASONEX) 50 MCG/ACT nasal spray Place 2 sprays into the nose daily.  . NON FORMULARY FD Guard  . PARoxetine (PAXIL) 10 MG tablet Paxil 10 mg tablet  Take 1 tablet every day by oral route.  . traMADol (ULTRAM) 50 MG tablet Take 50 mg by mouth as needed.  . triamcinolone cream (KENALOG) 0.1 % Apply 1 application topically 2 (two) times daily as needed (for breakout).    No facility-administered encounter medications on file as of 08/01/2020.    Allergies (verified) Shellfish allergy, Tetracycline, Effexor xr [venlafaxine hcl], Codeine, Iohexol, and Paroxetine   History: Past Medical History:  Diagnosis Date  . Acne rosacea   . Anxiety   . Bile salt-induced diarrhea   . Colon  polyps   . Degenerative disc disease   . Diverticulitis 05/2015  . Diverticulosis 05/10/2004  . Duodenitis 04/25/2005  . Esophageal stricture 09/13/2007  . Esophagitis 04/25/2005  . External hemorrhoids 05/10/2004  . 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 Fibromyalgia   . Gastroparesis   . GERD (gastroesophageal reflux disease) 09/13/2007  . Glaucoma   . Hiatal hernia 04/25/2005  . Hyperlipidemia   . Hypertension   . Hypothyroidism    . IBS (irritable bowel syndrome)   . Migraine   . Osteoarthritis   . Other and unspecified noninfectious gastroenteritis and colitis(558.9)   . Sessile colonic polyp 05/30/2009  . Vertigo   . Vitamin D deficiency    Past Surgical History:  Procedure Laterality Date  . BUNIONECTOMY  09/04/2011   left  . CESAREAN SECTION    . CHOLECYSTECTOMY  1985  . COLONOSCOPY    . FOOT OSTEOTOMY  2006   Right foot  . ROTATOR CUFF REPAIR Right 04/19/2020  . TUBAL LIGATION  1983  . VAGINAL HYSTERECTOMY  1993   Family History  Problem Relation Age of Onset  . Mitral valve prolapse Mother   . Aneurysm Mother        cerebral  . Hypertension Father   . Colon polyps Father   . Stroke Father   . Hypertension Sister        x2  . Diabetes Sister   . Colon cancer Maternal Aunt   . Stomach cancer Maternal Aunt   . Colon polyps Sister   . Pancreatic cancer Neg Hx   . Migraines Neg Hx    Social History   Socioeconomic History  . Marital status: Married    Spouse name: Not on file  . Number of children: 2  . Years of education: Not on file  . Highest education level: Not on file  Occupational History  . Occupation: Retired  Tobacco Use  . Smoking status: Never Smoker  . Smokeless tobacco: Never Used  Vaping Use  . Vaping Use: Never used  Substance and Sexual Activity  . Alcohol use: No  . Drug use: No  . Sexual activity: Yes  Other Topics Concern  . Not on file  Social History Narrative   Lives at home with husband   Right handed   Caffeine: 2 cups/day, sweet tea   Social Determinants of Health   Financial Resource Strain: Low Risk   . Difficulty of Paying Living Expenses: Not hard at all  Food Insecurity: No Food Insecurity  . Worried About Programme researcher, broadcasting/film/video in the Last Year: Never true  . Ran Out of Food in the Last Year: Never true  Transportation Needs: No Transportation Needs  . Lack of Transportation (Medical): No  . Lack of Transportation (Non-Medical): No   Physical Activity: Sufficiently Active  . Days of Exercise per Week: 5 days  . Minutes of Exercise per Session: 30 min  Stress: No Stress Concern Present  . Feeling of Stress : Not at all  Social Connections: Socially Integrated  . Frequency of Communication with Friends and Family: More than three times a week  . Frequency of Social Gatherings with Friends and Family: More than three times a week  . Attends Religious Services: More than 4 times per year  . Active Member of Clubs or Organizations: Yes  . Attends Banker Meetings: More than 4 times per year  . Marital Status: Married  Tobacco Counseling Counseling given: Not Answered   Clinical Intake:  Pre-visit preparation completed: Yes  Pain : No/denies pain     BMI - recorded: 33.29 Nutritional Status: BMI > 30  Obese Nutritional Risks: None Diabetes: No  How often do you need to have someone help you when you read instructions, pamphlets, or other written materials from your doctor or pharmacy?: 1 - Never What is the last grade level you completed in school?: High School Graduate  Diabetic? no  Interpreter Needed?: No  Information entered by :: Susie Cassette, LPN   Activities of Daily Living In your present state of health, do you have any difficulty performing the following activities: 08/01/2020  Hearing? N  Vision? N  Difficulty concentrating or making decisions? N  Walking or climbing stairs? N  Dressing or bathing? N  Doing errands, shopping? N  Preparing Food and eating ? N  Using the Toilet? N  In the past six months, have you accidently leaked urine? N  Do you have problems with loss of bowel control? N  Managing your Medications? N  Managing your Finances? N  Housekeeping or managing your Housekeeping? N  Some recent data might be hidden    Patient Care Team: Myrlene Broker, MD as PCP - General (Internal Medicine) Jens Som Madolyn Frieze, MD as PCP - Cardiology  (Cardiology) Jens Som Madolyn Frieze, MD as Consulting Physician (Cardiology) Rachael Fee, MD as Attending Physician (Gastroenterology) Marcene Corning, MD as Consulting Physician (Orthopedic Surgery) Janet Berlin, MD as Consulting Physician (Ophthalmology) Cari Caraway, MD as Referring Physician (Vascular Surgery) Levi Aland, MD as Consulting Physician (Obstetrics and Gynecology)  Indicate any recent Medical Services you may have received from other than [facility] providers in the past year (date may be approximate).     Assessment:   This is a routine wellness examination for Angela [name].  Hearing/Vision screen No exam data present  Dietary issues and exercise activities discussed: Current Exercise Habits: Home exercise routine;Structured exercise class, Type of exercise: walking;treadmill;stretching, Time (Minutes): 30, Frequency (Times/Week): 5, Weekly Exercise (Minutes/Week): 150, Intensity: Moderate, Exercise limited by: None identified  Goals    . Exercise 150 minutes per week (moderate activity)     Bike and treadmill x 2 a week Will try to add an extra day 60 min x 3 days     . Patient Stated     I want to exercise more, I will go to the [facility] twice a week. Continue to eat healthy, enjoy life and family.    . Patient Stated     Lose weight by monitoring how many sweets I eat and by increasing my physical activity. I will go to [employer] more frequently.    . Patient Stated     To lose 15 pounds and continue to be active daily.    . Reduce sugar intake to X grams per day      Depression Screen PHQ 2/9 Scores 08/01/2020 08/01/2019 07/30/2018 07/23/2017 11/22/2015 05/31/2015 05/23/2014  PHQ - 2 Score 0 0 0 0 0 0 0  PHQ- 9 Score - - - 2 - - -    Fall Risk Fall Risk  08/01/2020 08/01/2019 07/30/2018 07/23/2017 11/22/2015  Falls in the past year? 1 0 1 No No  Number falls in past yr: 1 0 0 - -  Injury with Fall? 1 0 0 - -  Risk for fall due to : - No Fall Risks - - -   Follow up -  Falls evaluation completed;Education provided;Falls prevention discussed Falls prevention discussed - -    FALL RISK PREVENTION PERTAINING TO THE HOME:  Any stairs in or around the home? Yes  If so, are there any without handrails? No  Home free of loose throw rugs in walkways, pet beds, electrical cords, etc? Yes  Adequate lighting in your home to reduce risk of falls? Yes   ASSISTIVE DEVICES UTILIZED TO PREVENT FALLS:  Life alert? No  Use of a cane, walker or w/c? No  Grab bars in the bathroom? Yes  Shower chair or bench in shower? No  Elevated toilet seat or a handicapped toilet? No   TIMED UP AND GO:  Was the test performed? No .  Length of time to ambulate 10 feet: 0 sec.   Gait steady and fast without use of assistive device  Cognitive Function: Normal cognitive status assessed by direct observation by this Nurse Health Advisor. No abnormalities found.       6CIT Screen 08/01/2019  What Year? 0 points  What month? 0 points  What time? 0 points  Count back from 20 0 points  Months in reverse 0 points  Repeat phrase 0 points  Total Score 0    Immunizations Immunization History  Administered Date(s) Administered  . Influenza Split 02/18/2011, 02/16/2012  . Influenza Whole 02/12/2009, 05/21/2010  . Influenza, High Dose Seasonal PF 04/04/2015, 02/07/2016, 12/19/2016  . Influenza, Seasonal, Injecte, Preservative Fre 03/29/2014  . Influenza,inj,Quad PF,6+ Mos 01/27/2013  . Influenza-Unspecified 07/14/2011, 01/12/2014, 01/12/2018, 12/09/2018  . PFIZER Comirnaty(Gray Top)Covid-19 Tri-Sucrose Vaccine 07/27/2020  . PFIZER(Purple Top)SARS-COV-2 Vaccination 05/04/2019, 05/23/2019, 12/20/2019  . Pneumococcal Conjugate-13 05/31/2015  . Pneumococcal Polysaccharide-23 03/29/2014  . Tdap 09/14/2012    TDAP status: Up to date  Flu Vaccine status: Up to date  Pneumococcal vaccine status: Up to date  Covid-19 vaccine status: Completed vaccines  Qualifies  for Shingles Vaccine? Yes   Zostavax completed Yes   Shingrix Completed?: Yes  Screening Tests Health Maintenance  Topic Date Due  . Hepatitis C Screening  Never done  . MAMMOGRAM  01/10/2019  . INFLUENZA VACCINE  11/12/2020  . TETANUS/TDAP  09/15/2022  . COLONOSCOPY (Pts 45-539yrs Insurance coverage will need to be confirmed)  02/14/2030  . DEXA SCAN  Completed  . COVID-19 Vaccine  Completed  . PNA vac Low Risk Adult  Completed  . HPV VACCINES  Aged Out    Health Maintenance  Health Maintenance Due  Topic Date Due  . Hepatitis C Screening  Never done  . MAMMOGRAM  01/10/2019    Colorectal cancer screening: Type of screening: Colonoscopy. Completed 02/15/2020. Repeat every 10 years  Mammogram status: Completed 10/03/2019. Repeat every year  Bone Density status: Completed 10/31/2002. Results reflect: Bone density results: NORMAL. Repeat every 0 years.  Lung Cancer Screening: (Low Dose CT Chest recommended if Age 60-80 years, 30 pack-year currently smoking OR have quit w/in 15years.) does not qualify.   Lung Cancer Screening Referral: no  Additional Screening:  Hepatitis C Screening: does qualify; Completed no  Vision Screening: Recommended annual ophthalmology exams for early detection of glaucoma and other disorders of the eye. Is the patient up to date with their annual eye exam?  Yes  Who is the provider or what is the name of the office in which the patient attends annual eye exams? Janet BerlinMichael Tanner, MD. If pt is not established with a provider, would they like to be referred to a provider to establish care? No .  Dental Screening: Recommended annual dental exams for proper oral hygiene  Community Resource Referral / Chronic Care Management: CRR required this visit?  No   CCM required this visit?  No      Plan:     I have personally reviewed and noted the following in the patient's chart:   . Medical and social history . Use of alcohol, tobacco or illicit  drugs  . Current medications and supplements . Functional ability and status . Nutritional status . Physical activity . Advanced directives . List of other physicians . Hospitalizations, surgeries, and ER visits in previous 12 months . Vitals . Screenings to include cognitive, depression, and falls . Referrals and appointments  In addition, I have reviewed and discussed with patient certain preventive protocols, quality metrics, and best practice recommendations. A written personalized care plan for preventive services as well as general preventive health recommendations were provided to patient.     Mickeal Needy, LPN   0/[id number]/0488   Nurse Notes:  Medications reviewed with patient; no opioid use noted.

## 2020-08-01 NOTE — Progress Notes (Signed)
   Subjective:   Patient ID: Angela [name], female    DOB: [birth year], 75 y.o.   MRN: [id number]  HPI The patient is a 75 YO female coming in for follow up dizziness (had been having this previously with standing, she is not getting this much anymore, still rarely, usually she can stop and this passes, sometimes with moving head too fast) and BP  (stable on metoprolol, denies chest pains or headaches, denies missing doses, denies side effects) and wheezing (started recently, some SOB which is a little worse than usual, mostly with exertion, denies fevers or chills, denies sinus congestion).   Review of Systems  Constitutional: Negative.   HENT: Negative.   Eyes: Negative.   Respiratory: Positive for shortness of breath and wheezing. Negative for cough and chest tightness.   Cardiovascular: Negative for chest pain, palpitations and leg swelling.  Gastrointestinal: Negative for abdominal distention, abdominal pain, constipation, diarrhea, nausea and vomiting.  Musculoskeletal: Negative.   Skin: Negative.   Neurological: Positive for dizziness.  Psychiatric/Behavioral: Negative.     Objective:  Physical Exam Constitutional:      Appearance: She is well-developed.  HENT:     Head: Normocephalic and atraumatic.  Cardiovascular:     Rate and Rhythm: Normal rate and regular rhythm.  Pulmonary:     Effort: Pulmonary effort is normal. No respiratory distress.     Breath sounds: Normal breath sounds. No wheezing or rales.     Comments: Some upper airway sounds Abdominal:     General: Bowel sounds are normal. There is no distension.     Palpations: Abdomen is soft.     Tenderness: There is no abdominal tenderness. There is no rebound.  Musculoskeletal:     Cervical back: Normal range of motion.  Skin:    General: Skin is warm and dry.  Neurological:     Mental Status: She is alert and oriented to person, place, and time.     Coordination: Coordination normal.     Vitals:    08/01/20 1049  BP: 130/68  Pulse: 75  Resp: 18  Temp: 97.8 F (36.6 C)  TempSrc: Oral  SpO2: 97%  Weight: 182 lb (82.6 kg)  Height: 5\' 2"  (1.575 m)    This visit occurred during the SARS-CoV-2 public health emergency.  Safety protocols were in place, including screening questions prior to the visit, additional usage of staff PPE, and extensive cleaning of exam room while observing appropriate contact time as indicated for disinfecting solutions.   Assessment & Plan:

## 2020-08-01 NOTE — Patient Instructions (Addendum)
If you are still having problems with dizziness decrease the metoprolol to 1/2 pill in the morning and 1/2 pill in the evening.    We are checking the x-ray of the lungs today.

## 2020-08-01 NOTE — Patient Instructions (Signed)
[name] , Thank you for taking time to come for your Medicare Wellness Visit. I appreciate your ongoing commitment to your health goals. Please review the following plan we discussed and let me know if I can assist you in the future.   Screening recommendations/referrals: Colonoscopy: 02/15/2020; due every 10 years Mammogram: 10/03/2019 Bone Density: 10/31/2002; normal (no longer recommended) Recommended yearly ophthalmology/optometry visit for glaucoma screening and checkup Recommended yearly dental visit for hygiene and checkup  Vaccinations: Influenza vaccine: 12/2019 per patient ([facility]) Pneumococcal vaccine: 03/29/2014, 2/816/2017 Tdap vaccine: 09/14/2012; due very 10 years Shingles vaccine: 07/06/2020; need second dose   Covid-19: 05/04/2019, 05/23/2019, 12/20/2019, 07/27/2020  Advanced directives: Advance directive discussed with you today. Even though you declined this today please call our office should you change your mind and we can give you the proper paperwork for you to fill out.  Conditions/risks identified: Yes; Reviewed health maintenance screenings with patient today and relevant education, vaccines, and/or referrals were provided. Please continue to do your personal lifestyle choices by: daily care of teeth and gums, regular physical activity (goal should be 5 days a week for 30 minutes), eat a healthy diet, avoid tobacco and drug use, limiting any alcohol intake, taking a low-dose aspirin (if not allergic or have been advised by your provider otherwise) and taking vitamins and minerals as recommended by your provider. Continue doing brain stimulating activities (puzzles, reading, adult coloring books, staying active) to keep memory sharp. Continue to eat heart healthy diet (full of fruits, vegetables, whole grains, lean protein, water--limit salt, fat, and sugar intake) and increase physical activity as tolerated.  Next appointment: Please schedule your next Medicare Wellness Visit with  your Nurse Health Advisor in 1 year by calling [phone].   Preventive Care 35 Years and Older, Female Preventive care refers to lifestyle choices and visits with your health care provider that can promote health and wellness. What does preventive care include?  A yearly physical exam. This is also called an annual well check.  Dental exams once or twice a year.  Routine eye exams. Ask your health care provider how often you should have your eyes checked.  Personal lifestyle choices, including:  Daily care of your teeth and gums.  Regular physical activity.  Eating a healthy diet.  Avoiding tobacco and drug use.  Limiting alcohol use.  Practicing safe sex.  Taking low-dose aspirin every day.  Taking vitamin and mineral supplements as recommended by your health care provider. What happens during an annual well check? The services and screenings done by your health care provider during your annual well check will depend on your age, overall health, lifestyle risk factors, and family history of disease. Counseling  Your health care provider may ask you questions about your:  Alcohol use.  Tobacco use.  Drug use.  Emotional well-being.  Home and relationship well-being.  Sexual activity.  Eating habits.  History of falls.  Memory and ability to understand (cognition).  Work and work Astronomer.  Reproductive health. Screening  You may have the following tests or measurements:  Height, weight, and BMI.  Blood pressure.  Lipid and cholesterol levels. These may be checked every 5 years, or more frequently if you are over 63 years old.  Skin check.  Lung cancer screening. You may have this screening every year starting at age 15 if you have a 30-pack-year history of smoking and currently smoke or have quit within the past 15 years.  Fecal occult blood test (FOBT) of the stool.  You may have this test every year starting at age 28.  Flexible  sigmoidoscopy or colonoscopy. You may have a sigmoidoscopy every 5 years or a colonoscopy every 10 years starting at age 31.  Hepatitis C blood test.  Hepatitis B blood test.  Sexually transmitted disease (STD) testing.  Diabetes screening. This is done by checking your blood sugar (glucose) after you have not eaten for a while (fasting). You may have this done every 1-3 years.  Bone density scan. This is done to screen for osteoporosis. You may have this done starting at age 56.  Mammogram. This may be done every 1-2 years. Talk to your health care provider about how often you should have regular mammograms. Talk with your health care provider about your test results, treatment options, and if necessary, the need for more tests. Vaccines  Your health care provider may recommend certain vaccines, such as:  Influenza vaccine. This is recommended every year.  Tetanus, diphtheria, and acellular pertussis (Tdap, Td) vaccine. You may need a Td booster every 10 years.  Zoster vaccine. You may need this after age 48.  Pneumococcal 13-valent conjugate (PCV13) vaccine. One dose is recommended after age 55.  Pneumococcal polysaccharide (PPSV23) vaccine. One dose is recommended after age 77. Talk to your health care provider about which screenings and vaccines you need and how often you need them. This information is not intended to replace advice given to you by your health care provider. Make sure you discuss any questions you have with your health care provider. Document Released: 04/27/2015 Document Revised: 12/19/2015 Document Reviewed: 01/30/2015 Elsevier Interactive Patient Education  2017 [employer] Prevention in the Home Falls can cause injuries. They can happen to people of all ages. There are many things you can do to make your home safe and to help prevent falls. What can I do on the outside of my home?  Regularly fix the edges of walkways and driveways and fix any  cracks.  Remove anything that might make you trip as you walk through a door, such as a raised step or threshold.  Trim any bushes or trees on the path to your home.  Use bright outdoor lighting.  Clear any walking paths of anything that might make someone trip, such as rocks or tools.  Regularly check to see if handrails are loose or broken. Make sure that both sides of any steps have handrails.  Any raised decks and porches should have guardrails on the edges.  Have any leaves, snow, or ice cleared regularly.  Use sand or salt on walking paths during winter.  Clean up any spills in your garage right away. This includes oil or grease spills. What can I do in the bathroom?  Use night lights.  Install grab bars by the toilet and in the tub and shower. Do not use towel bars as grab bars.  Use non-skid mats or decals in the tub or shower.  If you need to sit down in the shower, use a plastic, non-slip stool.  Keep the floor dry. Clean up any water that spills on the floor as soon as it happens.  Remove soap buildup in the tub or shower regularly.  Attach bath mats securely with double-sided non-slip rug tape.  Do not have throw rugs and other things on the floor that can make you trip. What can I do in the bedroom?  Use night lights.  Make sure that you have a light by your bed that  is easy to reach.  Do not use any sheets or blankets that are too big for your bed. They should not hang down onto the floor.  Have a firm chair that has side arms. You can use this for support while you get dressed.  Do not have throw rugs and other things on the floor that can make you trip. What can I do in the kitchen?  Clean up any spills right away.  Avoid walking on wet floors.  Keep items that you use a lot in easy-to-reach places.  If you need to reach something above you, use a strong step stool that has a grab bar.  Keep electrical cords out of the way.  Do not use floor  polish or wax that makes floors slippery. If you must use wax, use non-skid floor wax.  Do not have throw rugs and other things on the floor that can make you trip. What can I do with my stairs?  Do not leave any items on the stairs.  Make sure that there are handrails on both sides of the stairs and use them. Fix handrails that are broken or loose. Make sure that handrails are as long as the stairways.  Check any carpeting to make sure that it is firmly attached to the stairs. Fix any carpet that is loose or worn.  Avoid having throw rugs at the top or bottom of the stairs. If you do have throw rugs, attach them to the floor with carpet tape.  Make sure that you have a light switch at the top of the stairs and the bottom of the stairs. If you do not have them, ask someone to add them for you. What else can I do to help prevent falls?  Wear shoes that:  Do not have high heels.  Have rubber bottoms.  Are comfortable and fit you well.  Are closed at the toe. Do not wear sandals.  If you use a stepladder:  Make sure that it is fully opened. Do not climb a closed stepladder.  Make sure that both sides of the stepladder are locked into place.  Ask someone to hold it for you, if possible.  Clearly mark and make sure that you can see:  Any grab bars or handrails.  First and last steps.  Where the edge of each step is.  Use tools that help you move around (mobility aids) if they are needed. These include:  Canes.  Walkers.  Scooters.  Crutches.  Turn on the lights when you go into a dark area. Replace any light bulbs as soon as they burn out.  Set up your furniture so you have a clear path. Avoid moving your furniture around.  If any of your floors are uneven, fix them.  If there are any pets around you, be aware of where they are.  Review your medicines with your doctor. Some medicines can make you feel dizzy. This can increase your chance of falling. Ask your  doctor what other things that you can do to help prevent falls. This information is not intended to replace advice given to you by your health care provider. Make sure you discuss any questions you have with your health care provider. Document Released: 01/25/2009 Document Revised: 09/06/2015 Document Reviewed: 05/05/2014 Elsevier Interactive Patient Education  2017 [employer].

## 2020-08-02 ENCOUNTER — Other Ambulatory Visit (HOSPITAL_BASED_OUTPATIENT_CLINIC_OR_DEPARTMENT_OTHER): Payer: Self-pay

## 2020-08-02 DIAGNOSIS — R531 Weakness: Secondary | ICD-10-CM | POA: Diagnosis not present

## 2020-08-02 DIAGNOSIS — M25611 Stiffness of right shoulder, not elsewhere classified: Secondary | ICD-10-CM | POA: Diagnosis not present

## 2020-08-02 DIAGNOSIS — Z9889 Other specified postprocedural states: Secondary | ICD-10-CM | POA: Diagnosis not present

## 2020-08-02 DIAGNOSIS — M25511 Pain in right shoulder: Secondary | ICD-10-CM | POA: Diagnosis not present

## 2020-08-02 MED ORDER — PFIZER-BIONT COVID-19 VAC-TRIS 30 MCG/0.3ML IM SUSP
INTRAMUSCULAR | 0 refills | Status: DC
  Filled 2020-08-02: qty 0.3, 1d supply, fill #0

## 2020-08-03 DIAGNOSIS — R0602 Shortness of breath: Secondary | ICD-10-CM | POA: Insufficient documentation

## 2020-08-03 NOTE — Assessment & Plan Note (Signed)
BP at goal on metoprolol. Recent labs without indication for change. HR okay.

## 2020-08-03 NOTE — Assessment & Plan Note (Signed)
Undergoing biopsy with ENT in the near future with direct visualization of potential area in the vocal cord region.

## 2020-08-03 NOTE — Assessment & Plan Note (Signed)
Checking CXR but suspect wheezing sound is upper airway and related to GERD and post nasal drip which she is having more of both lately. Adjust as needed.

## 2020-08-03 NOTE — Assessment & Plan Note (Signed)
Overall improved and will continue with close monitoring.

## 2020-08-13 ENCOUNTER — Other Ambulatory Visit: Payer: Self-pay

## 2020-08-13 ENCOUNTER — Encounter (INDEPENDENT_AMBULATORY_CARE_PROVIDER_SITE_OTHER): Payer: Self-pay | Admitting: Otolaryngology

## 2020-08-13 ENCOUNTER — Ambulatory Visit (INDEPENDENT_AMBULATORY_CARE_PROVIDER_SITE_OTHER): Payer: Medicare PPO | Admitting: Otolaryngology

## 2020-08-13 VITALS — Temp 97.5°F

## 2020-08-13 DIAGNOSIS — J387 Other diseases of larynx: Secondary | ICD-10-CM

## 2020-08-13 NOTE — Progress Notes (Signed)
HPI: Angela [name] is a 75 y.o. female who presents is referred by Dr. Lucia Gaskins for evaluation of abnormality noted on recent CT scan.  Apparently patient underwent a vascular CT scan of the head and neck.  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  Patient states that she has a history of a goiter.  She takes Synthroid regularly.  She also takes Nexium for reflux disease.  She denies any trouble breathing and she has had no hoarseness or sore throat...  Past Medical History:  Diagnosis Date  . Acne rosacea   . Anxiety   . Bile salt-induced diarrhea   . Colon polyps   . Degenerative disc disease   . Diverticulitis 05/2015  . Diverticulosis 05/10/2004  . Duodenitis 04/25/2005  . Esophageal stricture 09/13/2007  . Esophagitis 04/25/2005  . External hemorrhoids 05/10/2004  . 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 Fibromyalgia   . Gastroparesis   . GERD (gastroesophageal reflux disease) 09/13/2007  . Glaucoma   . Hiatal hernia 04/25/2005  . Hyperlipidemia   . Hypertension   . Hypothyroidism   . IBS (irritable bowel syndrome)   . Migraine   . Osteoarthritis   . Other and unspecified noninfectious gastroenteritis and colitis(558.9)   . Sessile colonic polyp 05/30/2009  . Vertigo   . Vitamin D deficiency    Past Surgical History:  Procedure Laterality Date  . BUNIONECTOMY  09/04/2011   left  . CESAREAN SECTION    . CHOLECYSTECTOMY  1985  . COLONOSCOPY    . FOOT OSTEOTOMY  2006   Right foot  . ROTATOR CUFF REPAIR Right 04/19/2020  . TUBAL LIGATION  1983  . VAGINAL HYSTERECTOMY  1993   Social History   Socioeconomic History  . Marital status: Married    Spouse name: Not on file  . Number of children: 2  . Years of education: Not on file  . Highest education level: Not on file  Occupational History  . Occupation: Retired  Tobacco Use  . Smoking status: Never Smoker  .  Smokeless tobacco: Never Used  Vaping Use  . Vaping Use: Never used  Substance and Sexual Activity  . Alcohol use: No  . Drug use: No  . Sexual activity: Yes  Other Topics Concern  . Not on file  Social History Narrative   Lives at home with husband   Right handed   Caffeine: 2 cups/day, sweet tea   Social Determinants of Health   Financial Resource Strain: Low Risk   . Difficulty of Paying Living Expenses: Not hard at all  Food Insecurity: No Food Insecurity  . Worried About Programme researcher, broadcasting/film/video in the Last Year: Never true  . Ran Out of Food in the Last Year: Never true  Transportation Needs: No Transportation Needs  . Lack of Transportation (Medical): No  . Lack of Transportation (Non-Medical): No  Physical Activity: Sufficiently Active  . Days of Exercise per Week: 5 days  . Minutes of Exercise per Session: 30 min  Stress: No Stress Concern Present  . Feeling of Stress : Not at all  Social Connections: Socially Integrated  . Frequency of Communication with Friends and Family: More than three times a week  . Frequency of Social Gatherings with Friends and Family: More than three times a week  . Attends Religious Services: More than 4 times per year  . Active Member  of Clubs or Organizations: Yes  . Attends Banker Meetings: More than 4 times per year  . Marital Status: Married   Family History  Problem Relation Age of Onset  . Mitral valve prolapse Mother   . Aneurysm Mother        cerebral  . Hypertension Father   . Colon polyps Father   . Stroke Father   . Hypertension Sister        x2  . Diabetes Sister   . Colon cancer Maternal Aunt   . Stomach cancer Maternal Aunt   . Colon polyps Sister   . Pancreatic cancer Neg Hx   . Migraines Neg Hx    Allergies  Allergen Reactions  . Shellfish Allergy Anaphylaxis  . Tetracycline Other (See Comments)    GI bleed  . Effexor Xr [Venlafaxine Hcl] Other (See Comments)    Nightmare and vivid dreams  .  Codeine Nausea And Vomiting and Other (See Comments)    GI upset/vomit  . Iohexol Hives    Per Dr.Grady patient doesn't need a 13 hour prep, only itching - patient already had a rash due to autoimmune condition //rls   . Paroxetine Rash   Prior to Admission medications   Medication Sig Start Date End Date Taking? Authorizing Provider  AMBULATORY NON FORMULARY MEDICATION Medication Name: FDgard samples given #32 lot number [id number]  Exp. 12/2016  gm Patient taking differently: Take 1 tablet by mouth 2 (two) times daily. Medication Name: FDgard samples given #32 lot number [id number]  Exp. 12/2016  gm 07/22/16   Unk Lightning, PA  aspirin 81 MG tablet Take 81 mg by mouth daily.    [provider]  cholestyramine (QUESTRAN) 4 g packet TAKE 1 PACKET (4 G TOTAL) BY MOUTH DAILY. 05/24/20   Rachael Fee, MD  COVID-19 mRNA Vac-TriS, Pfizer, (PFIZER-BIONT COVID-19 VAC-TRIS) SUSP injection Inject into the muscle. 07/27/20   Judyann Munson, MD  desonide (DESOWEN) 0.05 % cream Apply topically 2 (two) times daily. 12/22/16   Myra Rude, MD  esomeprazole (NEXIUM) 40 MG capsule TAKE 1 CAPSULE BY MOUTH 2 TIMES DAILY BEFORE A MEAL. 07/30/20   Rachael Fee, MD  famotidine (PEPCID) 20 MG tablet Take 1 tablet (20 mg total) by mouth at bedtime. 11/15/19   Myrlene Broker, MD  fluocinonide gel (LIDEX) 0.05 % fluocinonide 0.05 % topical gel  APPLY 4-5 APPLICATIONS DAILY AS NEEDED    [provider]  HYDROcodone-acetaminophen (NORCO/VICODIN) 5-325 MG tablet Take 1 tablet by mouth twice a day as needed for post op pain. Patient not taking: Reported on 08/01/2020 06/11/20   [provider]  ketoconazole (NIZORAL) 2 % cream Apply 1 application topically daily as needed for irritation.     [provider]  latanoprost (XALATAN) 0.005 % ophthalmic solution latanoprost 0.005 % eye drops  PLACE 1 DROP IN BOTH EYES AT BEDTIME    [provider]  levothyroxine  (SYNTHROID) 50 MCG tablet TAKE 0.5 TABLETS (25 MCG TOTAL) BY MOUTH DAILY. 05/21/20   Myrlene Broker, MD  loratadine (CLARITIN) 10 MG tablet TAKE 1 TABLET EVERY DAY AS NEEDED FOR ALLEGIES 04/18/20   Olive Bass, FNP  meloxicam (MOBIC) 15 MG tablet Take 15 mg by mouth daily.    [provider]  metoprolol tartrate (LOPRESSOR) 50 MG tablet TAKE 1 TABLET BY MOUTH TWICE A DAY 07/27/20   Lewayne Bunting, MD  mometasone (NASONEX) 50 MCG/ACT nasal spray Place 2 sprays  into the nose daily. 04/18/20   Olive Bass, FNP  NON FORMULARY FD Guard    [provider]  PARoxetine (PAXIL) 10 MG tablet Paxil 10 mg tablet  Take 1 tablet every day by oral route.    [provider]  traMADol (ULTRAM) 50 MG tablet Take 50 mg by mouth as needed. 07/16/20   [provider]  triamcinolone cream (KENALOG) 0.1 % Apply 1 application topically 2 (two) times daily as needed (for breakout).     [provider]     Positive ROS: Otherwise negative  All other systems have been reviewed and were otherwise negative with the exception of those mentioned in the HPI and as above.  Physical Exam: Constitutional: Alert, well-appearing, no acute distress. Ears: External ears without lesions or tenderness. Ear canals with minimal wax buildup that was cleaned with curettes.  TMs were otherwise clear bilaterally. Nasal: External nose without lesions. Septum relatively midline.. Clear nasal passages bilaterally. Oral: Lips and gums without lesions. Tongue and palate mucosa without lesions. Posterior oropharynx clear.  Indirect laryngoscopy revealed clear base of tongue vallecula and epiglottis.  Piriform sinuses were clear. Fiberoptic laryngoscopy was performed to the left nostril.  The nasopharynx was clear.  The base of tongue vallecula and epiglottis were normal.  The laryngeal surface of epiglottis was clear.  Vocal cords were clear bilaterally with normal vocal  mobility.  Glottis was clear.  Piriform sinuses were clear. Neck: No palpable adenopathy or masses.  No significant palpable thyroid nodules or thyroid enlargement. Respiratory: Breathing comfortably  Skin: No facial/neck lesions or rash noted.  Laryngoscopy  Date/Time: 08/13/2020 5:14 PM Performed by: Drema Halon, MD Authorized by: Drema Halon, MD   Consent:    Consent obtained:  Verbal   Consent given by:  Patient Procedure details:    Indications: direct visualization of the upper aerodigestive tract     Medication:  Afrin   Instrument: flexible fiberoptic laryngoscope     Scope location: left nare   Sinus:    Left nasopharynx: normal   Mouth:    Oropharynx: normal     Vallecula: normal     Base of tongue: normal     Epiglottis: normal   Throat:    Pyriform sinus: normal     True vocal cords: normal   Comments:     On fiberoptic laryngoscopy the hypopharynx and larynx were normal.  Epiglottis and vocal cords were clear bilaterally.    Assessment: Questionable supraglottic mass noted on recent CT scan of the neck.  On review of this she had mostly closure of her supraglottic area noted on the CT scan. However on exam in the office she has normal hypopharynx and larynx on exam with no supraglottic abnormalities noted.  Plan: I reviewed the CT scan with the patient in the office.  I discussed with her that on clinical examination the supraglottic area is clear with no abnormalities or masses noted. She will follow-up as needed   Narda Bonds, MD   CC:

## 2020-08-15 DIAGNOSIS — Z9889 Other specified postprocedural states: Secondary | ICD-10-CM | POA: Diagnosis not present

## 2020-08-15 DIAGNOSIS — M75121 Complete rotator cuff tear or rupture of right shoulder, not specified as traumatic: Secondary | ICD-10-CM | POA: Diagnosis not present

## 2020-08-23 DIAGNOSIS — N9089 Other specified noninflammatory disorders of vulva and perineum: Secondary | ICD-10-CM | POA: Diagnosis not present

## 2020-10-08 DIAGNOSIS — Z1231 Encounter for screening mammogram for malignant neoplasm of breast: Secondary | ICD-10-CM | POA: Diagnosis not present

## 2020-10-30 DIAGNOSIS — H401131 Primary open-angle glaucoma, bilateral, mild stage: Secondary | ICD-10-CM | POA: Diagnosis not present

## 2020-11-13 DIAGNOSIS — L810 Postinflammatory hyperpigmentation: Secondary | ICD-10-CM | POA: Diagnosis not present

## 2020-11-13 DIAGNOSIS — L719 Rosacea, unspecified: Secondary | ICD-10-CM | POA: Diagnosis not present

## 2020-11-13 DIAGNOSIS — L710 Perioral dermatitis: Secondary | ICD-10-CM | POA: Diagnosis not present

## 2020-11-13 DIAGNOSIS — Z872 Personal history of diseases of the skin and subcutaneous tissue: Secondary | ICD-10-CM | POA: Diagnosis not present

## 2020-12-09 DIAGNOSIS — Z20822 Contact with and (suspected) exposure to covid-19: Secondary | ICD-10-CM | POA: Diagnosis not present

## 2020-12-09 DIAGNOSIS — R509 Fever, unspecified: Secondary | ICD-10-CM | POA: Diagnosis not present

## 2020-12-12 DIAGNOSIS — U071 COVID-19: Secondary | ICD-10-CM | POA: Diagnosis not present

## 2020-12-12 DIAGNOSIS — Z9189 Other specified personal risk factors, not elsewhere classified: Secondary | ICD-10-CM | POA: Diagnosis not present

## 2020-12-13 ENCOUNTER — Telehealth (INDEPENDENT_AMBULATORY_CARE_PROVIDER_SITE_OTHER): Payer: Medicare PPO | Admitting: Family Medicine

## 2020-12-13 ENCOUNTER — Encounter: Payer: Self-pay | Admitting: Family Medicine

## 2020-12-13 DIAGNOSIS — U071 COVID-19: Secondary | ICD-10-CM | POA: Diagnosis not present

## 2020-12-13 MED ORDER — MOLNUPIRAVIR EUA 200MG CAPSULE: 4.0000 | ORAL_CAPSULE | Freq: Two times a day (BID) | ORAL | 0 refills | Status: AC

## 2020-12-13 MED ORDER — BENZONATATE 100 MG PO CAPS: 100.0000 mg | ORAL_CAPSULE | Freq: Three times a day (TID) | ORAL | 0 refills | Status: DC | PRN

## 2020-12-13 NOTE — Progress Notes (Signed)
Virtual Visit via Video Note  I connected with Angela [name]  on 12/13/20 at  1:20 PM EDT by a video enabled telemedicine application and verified that I am speaking with the correct person using two identifiers.  Location patient: home, [place] Location provider:work or home office Persons participating in the virtual visit: patient, provider  I discussed the limitations of evaluation and management by telemedicine and the availability of in person appointments. The patient expressed understanding and agreed to proceed.   HPI:  Acute telemedicine visit for Covid19: -Onset:4-5 days days ago -Symptoms include:  sore throat, cough, mild fever initially - now resolved -she was diagnosed with covid at urgent care the 2nd day she was sick  and was treated with a zpack -other family members whom saw different providers were given antiviral which she is interested in -Denies:fevers, CP, SOB, NVD, inability to eat/drink/get out of bed -Pertinent past medical history: see below -Pertinent medication allergies:  Allergies  Allergen Reactions   Shellfish Allergy Anaphylaxis   Tetracycline Other (See Comments)    GI bleed   Effexor Xr [Venlafaxine Hcl] Other (See Comments)    Nightmare and vivid dreams   Codeine Nausea And Vomiting and Other (See Comments)    GI upset/vomit   Iohexol Hives    Per Dr.Grady patient doesn't need a 13 hour prep, only itching - patient already had a rash due to autoimmune condition //rls    Paroxetine Rash  -COVID-19 vaccine status: vaccinated x2 and 2 boosters  ROS: See pertinent positives and negatives per HPI.  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   Fibromyalgia    Gastroparesis    GERD (gastroesophageal reflux disease) 09/13/2007   Glaucoma    Hiatal hernia 04/25/2005   Hyperlipidemia    Hypertension    Hypothyroidism    IBS (irritable bowel syndrome)    Migraine    Osteoarthritis    Other and unspecified noninfectious gastroenteritis and colitis(558.9)    Sessile colonic polyp 05/30/2009   Vertigo    Vitamin D deficiency     Past Surgical History:  Procedure Laterality Date   BUNIONECTOMY  09/04/2011   left   CESAREAN SECTION     CHOLECYSTECTOMY  1985   COLONOSCOPY     FOOT OSTEOTOMY  2006   Right foot   ROTATOR CUFF REPAIR Right 04/19/2020   TUBAL LIGATION  1983   VAGINAL HYSTERECTOMY  1993     Current Outpatient Medications:    AMBULATORY NON FORMULARY MEDICATION, Medication Name: FDgard samples given #32 lot number [id number]  Exp. 12/2016  gm (Patient taking differently: Take 1 tablet by mouth 2 (two) times daily. Medication Name: FDgard samples given #32 lot number [id number]  Exp. 12/2016  gm), Disp: 32 capsule, Rfl: 0   aspirin 81 MG tablet, Take 81 mg by mouth daily., Disp: , Rfl:    AZITHROMYCIN PO, Take 250 mg by mouth. As directed for 5 days, Disp: , Rfl:    benzonatate (TESSALON PERLES) 100 MG capsule, Take 1 capsule (100 mg total) by mouth 3 (three) times daily as needed., Disp: 20 capsule, Rfl: 0  cholestyramine (QUESTRAN) 4 g packet, TAKE 1 PACKET (4 G TOTAL) BY MOUTH DAILY., Disp: 90 packet, Rfl: 2   COVID-19 mRNA Vac-TriS, Pfizer, (PFIZER-BIONT COVID-19 VAC-TRIS) SUSP injection, Inject into the muscle., Disp: 0.3 mL, Rfl: 0   desonide (DESOWEN) 0.05 % cream, Apply topically 2 (two) times daily., Disp: 30 g, Rfl: 0   esomeprazole (NEXIUM) 40 MG capsule, TAKE 1 CAPSULE BY MOUTH 2 TIMES DAILY BEFORE A MEAL., Disp: 180 capsule, Rfl: 3   famotidine (PEPCID) 20 MG tablet, Take 1 tablet (20 mg total) by mouth at bedtime., Disp: 90 tablet, Rfl: 1   fluocinonide gel (LIDEX) 0.05 %, fluocinonide 0.05 %  topical gel  APPLY 4-5 APPLICATIONS DAILY AS NEEDED, Disp: , Rfl:    ketoconazole (NIZORAL) 2 % cream, Apply 1 application topically daily as needed for irritation. , Disp: , Rfl:    latanoprost (XALATAN) 0.005 % ophthalmic solution, latanoprost 0.005 % eye drops  PLACE 1 DROP IN BOTH EYES AT BEDTIME, Disp: , Rfl:    levothyroxine (SYNTHROID) 50 MCG tablet, TAKE 0.5 TABLETS (25 MCG TOTAL) BY MOUTH DAILY., Disp: 45 tablet, Rfl: 2   loratadine (CLARITIN) 10 MG tablet, TAKE 1 TABLET EVERY DAY AS NEEDED FOR ALLEGIES, Disp: 90 tablet, Rfl: 3   meloxicam (MOBIC) 15 MG tablet, Take 15 mg by mouth daily., Disp: , Rfl:    metoprolol tartrate (LOPRESSOR) 50 MG tablet, TAKE 1 TABLET BY MOUTH TWICE A DAY (Patient taking differently: Take 1 tablet every morning and 0.5 tablet every evening), Disp: 180 tablet, Rfl: 3   molnupiravir EUA 200 mg CAPS, Take 4 capsules (800 mg total) by mouth 2 (two) times daily for 5 days., Disp: 40 capsule, Rfl: 0   mometasone (NASONEX) 50 MCG/ACT nasal spray, Place 2 sprays into the nose daily., Disp: , Rfl:    NON FORMULARY, FD Guard, Disp: , Rfl:    PARoxetine (PAXIL) 10 MG tablet, Paxil 10 mg tablet  Take 1 tablet every day by oral route., Disp: , Rfl:    traMADol (ULTRAM) 50 MG tablet, Take 50 mg by mouth as needed., Disp: , Rfl:    triamcinolone cream (KENALOG) 0.1 %, Apply 1 application topically 2 (two) times daily as needed (for breakout). , Disp: , Rfl:   EXAM:  VITALS per patient if applicable:  GENERAL: alert, oriented, appears well and in no acute distress  HEENT: atraumatic, conjunttiva clear, no obvious abnormalities on inspection of external nose and ears  NECK: normal movements of the head and neck  LUNGS: on inspection no signs of respiratory distress, breathing rate appears normal, no obvious gross SOB, gasping or wheezing  CV: no obvious cyanosis  MS: moves all visible extremities without noticeable abnormality  PSYCH/NEURO: pleasant and cooperative,  no obvious depression or anxiety, speech and thought processing grossly intact  ASSESSMENT AND PLAN:  Discussed the following assessment and plan:  COVID-19   Discussed treatment options (infusions and oral options and risk of drug interactions), ideal treatment window, potential complications, isolation and precautions for COVID-19.  Discussed possibility of rebound with antivirals and the need to reisolate if it should occur for 5 days. Checked for/reviewed any labs done in the last 90 days with GFR listed in HPI if available. After lengthy discussion, the patient opted for treatment with molnupiravir due to being higher risk for complications of covid or severe disease and other factors. Discussed EUA status of this drug and the fact that there is preliminary limited knowledge of risks/interactions/side effects per EUA  document vs possible benefits and precautions. She wanted an antiviral as other family members were given this and she wishes to do what she can to reduce the chance of complications. She opted against paxlovid as has not had recent labs and for other reasons. This information was shared with patient during the visit and also was provided in patient instructions. Also, advised that patient discuss risks/interactions and use with pharmacist/treatment team as well.  The patient did want a prescription for cough, Tessalon Rx sent.  Other symptomatic care measures summarized in patient instructions.  Advised to seek prompt in person care if worsening, new symptoms arise, or if is not improving with treatment. Discussed options for inperson care if PCP office not available. Did let this patient know that I only do telemedicine on Tuesdays and Thursdays for [place]. Advised to schedule follow up visit with PCP or [facility] if any further questions or concerns to avoid delays in care.   I discussed the assessment and treatment plan with the patient. The patient was provided an opportunity to ask  questions and all were answered. The patient agreed with the plan and demonstrated an understanding of the instructions.     Terressa Koyanagi, DO

## 2020-12-13 NOTE — Patient Instructions (Addendum)
HOME CARE TIPS:  -I sent the medication(s) we discussed to your pharmacy: Meds ordered this encounter  Medications   benzonatate (TESSALON PERLES) 100 MG capsule    Sig: Take 1 capsule (100 mg total) by mouth 3 (three) times daily as needed.    Dispense:  20 capsule    Refill:  0   molnupiravir EUA 200 mg CAPS    Sig: Take 4 capsules (800 mg total) by mouth 2 (two) times daily for 5 days.    Dispense:  40 capsule    Refill:  0     -I sent in the [place] treatment or referral you requested per our discussion. Please see the information provided below and discuss further with the pharmacist/treatment team.   -If taking an antiviral, there is a chance of rebound illness after finishing your treatment. If you become sick again please isolate for an additional 5 days.   -can use tylenol for fevers, aches and pains per instructions  -can use nasal saline a few times per day if you have nasal congestion  -stay hydrated, drink plenty of fluids and eat small healthy meals - avoid dairy  -can take 1000 IU (57mg) Vit D3 and 100-500 mg of Vit C daily per instructions  -follow up with your doctor in 2-3 days unless improving and feeling better  -stay home while sick, except to seek medical care. If you have COVID19, ideally it would be best to stay home for a full 10 days since the onset of symptoms PLUS one day of no fever and feeling better. Wear a good mask that fits snugly (such as N95 or KN95) if around others to reduce the risk of transmission.  It was nice to meet you today, and I really hope you are feeling better soon. I help [facility] out with telemedicine visits on Tuesdays and Thursdays and am available for visits on those days. If you have any concerns or questions following this visit please schedule a follow up visit with your Primary Care doctor or seek care at a local urgent care clinic to avoid delays in care.    Seek in person care or schedule a follow up video visit  promptly if your symptoms worsen, new concerns arise or you are not improving with treatment. Call 911 and/or seek emergency care if your symptoms are severe or life threatening.    Fact Sheet for Patients And Caregivers Emergency Use Authorization (EUA) Of LAGEVRIOT (molnupiravir) capsules For Coronavirus Disease 2019 (COVID-19)  What is the most important information I should know about LAGEVRIO? LAGEVRIO may cause serious side effects, including: ? LAGEVRIO may cause harm to your unborn baby. It is not known if LAGEVRIO will harm your baby if you take LAGEVRIO during pregnancy. o LAGEVRIO is not recommended for use in pregnancy. o LAGEVRIO has not been studied in pregnancy. LAGEVRIO was studied in pregnant animals only. When LAGEVRIO was given to pregnant animals, LAGEVRIO caused harm to their unborn babies. o You and your healthcare provider may decide that you should take LAGEVRIO during pregnancy if there are no other COVID-19 treatment options approved or authorized by the FDA that are accessible or clinically appropriate for you. o If you and your healthcare provider decide that you should take LAGEVRIO during pregnancy, you and your healthcare provider should discuss the known and potential benefits and the potential risks of taking LAGEVRIO during pregnancy. For individuals who are able to become pregnant: ? You should use a reliable method of birth  control (contraception) consistently and correctly during treatment with LAGEVRIO and for 4 days after the last dose of LAGEVRIO. Talk to your healthcare provider about reliable birth control methods. ? Before starting treatment with LAGEVRIO your healthcare provider may do a pregnancy test to see if you are pregnant before starting treatment with LAGEVRIO. ? Tell your healthcare provider right away if you become pregnant or think you may be pregnant during treatment with LAGEVRIO. Pregnancy Surveillance Program: ? There is a  pregnancy surveillance program for individuals who take LAGEVRIO during pregnancy. The purpose of this program is to collect information about the health of you and your baby. Talk to your healthcare provider about how to take part in this program. ? If you take LAGEVRIO during pregnancy and you agree to participate in the pregnancy surveillance program and allow your healthcare provider to share your information with [employer], then your healthcare provider will report your use of LAGEVRIO during pregnancy to [employer] by calling [phone] or pregnancyreporting.msd.com. For individuals who are sexually active with partners who are able to become pregnant: ? It is not known if LAGEVRIO can affect sperm. While the risk is regarded as low, animal studies to fully assess the potential for LAGEVRIO to affect the babies of males treated with LAGEVRIO have not been completed. A reliable method of birth control (contraception) should be used consistently and correctly during treatment with LAGEVRIO and for at least 3 months after the last dose. The risk to sperm beyond 3 months is not known. Studies to understand the risk to sperm beyond 3 months are ongoing. Talk to your healthcare provider about reliable birth control methods. Talk to your healthcare provider if you have questions or concerns about how LAGEVRIO may affect sperm. You are being given this fact sheet because your healthcare provider believes it is necessary to provide you with LAGEVRIO for the treatment of adults with mild-to-moderate coronavirus disease 2019 (COVID-19) with positive results of direct SARS-CoV-2 viral testing, and who are at high risk for progression to severe COVID-19 including hospitalization or death, and for whom other COVID-19 treatment options approved or authorized by the FDA are not accessible or clinically appropriate. The U.S. Food and Drug Administration (FDA) has issued an  Emergency Use Authorization (EUA) to make LAGEVRIO available during the COVID-19 pandemic (for more details about an EUA please see "What is an Emergency Use Authorization?" at the end of this document). LAGEVRIO is not an FDA-approved medicine in the [place]. Read this Fact Sheet for information about LAGEVRIO. Talk to your healthcare provider about your options if you have any questions. It is your choice to take LAGEVRIO.  What is COVID-19? COVID-19 is caused by a virus called a coronavirus. You can get COVID-19 through close contact with another person who has the virus. COVID-19 illnesses have ranged from very mild-to-severe, including illness resulting in death. While information so far suggests that most COVID-19 illness is mild, serious illness can happen and may cause some of your other medical conditions to become worse. Older people and people of all ages with severe, long lasting (chronic) medical conditions like heart disease, lung disease and diabetes, for example seem to be at higher risk of being hospitalized for COVID-19.  What is LAGEVRIO? LAGEVRIO is an investigational medicine used to treat mild-to-moderate COVID-19 in adults: ? with positive results of direct SARS-CoV-2 viral testing, and ? who are at high risk for progression to severe COVID-19 including hospitalization or   death, and for whom other COVID-19 treatment options approved or authorized by the FDA are not accessible or clinically appropriate. The FDA has authorized the emergency use of LAGEVRIO for the treatment of mild-tomoderate COVID-19 in adults under an EUA. For more information on EUA, see the "What is an Emergency Use Authorization (EUA)?" section at the end of this Fact Sheet. LAGEVRIO is not authorized: ? for use in people less than 63 years of age. ? for prevention of COVID-19. ? for people needing hospitalization for COVID-19. ? for use for longer than 5 consecutive days.  What should I  tell my healthcare provider before I take LAGEVRIO? Tell your healthcare provider if you: ? Have any allergies ? Are breastfeeding or plan to breastfeed ? Have any serious illnesses ? Are taking any medicines (prescription, over-the-counter, vitamins, or herbal products).  How do I take LAGEVRIO? ? Take LAGEVRIO exactly as your healthcare provider tells you to take it. ? Take 4 capsules of LAGEVRIO every 12 hours (for example, at 8 am and at 8 pm) ? Take LAGEVRIO for 5 days. It is important that you complete the full 5 days of treatment with LAGEVRIO. Do not stop taking LAGEVRIO before you complete the full 5 days of treatment, even if you feel better. ? Take LAGEVRIO with or without food. ? You should stay in isolation for as long as your healthcare provider tells you to. Talk to your healthcare provider if you are not sure about how to properly isolate while you have COVID-19. ? Swallow LAGEVRIO capsules whole. Do not open, break, or crush the capsules. If you cannot swallow capsules whole, tell your healthcare provider. ? What to do if you miss a dose: o If it has been less than 10 hours since the missed dose, take it as soon as you remember o If it has been more than 10 hours since the missed dose, skip the missed dose and take your dose at the next scheduled time. ? Do not double the dose of LAGEVRIO to make up for a missed dose.  What are the important possible side effects of LAGEVRIO? ? See, "What is the most important information I should know about LAGEVRIO?" ? Allergic Reactions. Allergic reactions can happen in people taking LAGEVRIO, even after only 1 dose. Stop taking LAGEVRIO and call your healthcare provider right away if you get any of the following symptoms of an allergic reaction: o hives o rapid heartbeat o trouble swallowing or breathing o swelling of the mouth, lips, or face o throat tightness o hoarseness o skin rash The most common side effects of  LAGEVRIO are: ? diarrhea ? nausea ? dizziness These are not all the possible side effects of LAGEVRIO. Not many people have taken LAGEVRIO. Serious and unexpected side effects may happen. This medicine is still being studied, so it is possible that all of the risks are not known at this time.  What other treatment choices are there?  Veklury (remdesivir) is FDA-approved as an intravenous (IV) infusion for the treatment of mildto-moderate [id number] in certain adults and children. Talk with your doctor to see if Marijean Heath is appropriate for you. Like LAGEVRIO, FDA may also allow for the emergency use of other medicines to treat people with COVID-19. Go to LacrosseProperties.si for more information. It is your choice to be treated or not to be treated with LAGEVRIO. Should you decide not to take it, it will not change your standard medical care.  What if I am breastfeeding?  Breastfeeding is not recommended during treatment with LAGEVRIO and for 4 days after the last dose of LAGEVRIO. If you are breastfeeding or plan to breastfeed, talk to your healthcare provider about your options and specific situation before taking LAGEVRIO.  How do I report side effects with LAGEVRIO? Contact your healthcare provider if you have any side effects that bother you or do not go away. Report side effects to FDA MedWatch at SmoothHits.hu or call 1-800-FDA-1088 (1- 8505376637).  How should I store [place]? ? Store LAGEVRIO capsules at room temperature between 44F to 4F (20C to 25C). ? Keep LAGEVRIO and all medicines out of the reach of children and pets. How can I learn more about COVID-19? ? Ask your healthcare provider. ? Visit SeekRooms.co.uk ? Contact your local or state public health department. ? Call [place] at [phone] (toll free in the [place] ? Visit  www.molnupiravir.com  What Is an Emergency Use Authorization (EUA)? The [place] FDA has made [place] available under an emergency access mechanism called an Emergency Use Authorization (EUA) The EUA is supported by a Presenter, broadcasting Health and Human Service (HHS) declaration that circumstances exist to justify emergency use of drugs and biological products during the COVID-19 pandemic. LAGEVRIO for the treatment of mild-to-moderate COVID-19 in adults with positive results of direct SARS-CoV-2 viral testing, who are at high risk for progression to severe COVID-19, including hospitalization or death, and for whom alternative COVID-19 treatment options approved or authorized by FDA are not accessible or clinically appropriate, has not undergone the same type of review as an FDA-approved product. In issuing an EUA under the JFHLK-56 public health emergency, the FDA has determined, among other things, that based on the total amount of scientific evidence available including data from adequate and well-controlled clinical trials, if available, it is reasonable to believe that the product may be effective for diagnosing, treating, or preventing COVID-19, or a serious or life-threatening disease or condition caused by COVID-19; that the known and potential benefits of the product, when used to diagnose, treat, or prevent such disease or condition, outweigh the known and potential risks of such product; and that there are no adequate, approved, and available alternatives.  All of these criteria must be met to allow for the product to be used in the treatment of patients during the COVID-19 pandemic. The EUA for LAGEVRIO is in effect for the duration of the COVID-19 declaration justifying emergency use of LAGEVRIO, unless terminated or revoked (after which LAGEVRIO may no longer be used under the EUA). For patent information: http://rogers.info/ Copyright  2021-2022 [employer]  [place], [place] and its affiliates. All rights reserved. [id number] Revised: March 2022

## 2020-12-31 ENCOUNTER — Telehealth (INDEPENDENT_AMBULATORY_CARE_PROVIDER_SITE_OTHER): Payer: Medicare PPO | Admitting: Nurse Practitioner

## 2020-12-31 VITALS — BP 115/65 | HR 75 | Temp 97.1°F

## 2020-12-31 DIAGNOSIS — R059 Cough, unspecified: Secondary | ICD-10-CM

## 2020-12-31 DIAGNOSIS — R42 Dizziness and giddiness: Secondary | ICD-10-CM

## 2020-12-31 DIAGNOSIS — R062 Wheezing: Secondary | ICD-10-CM | POA: Diagnosis not present

## 2020-12-31 DIAGNOSIS — R0602 Shortness of breath: Secondary | ICD-10-CM

## 2020-12-31 MED ORDER — ALBUTEROL SULFATE HFA 108 (90 BASE) MCG/ACT IN AERS: 2.0000 | INHALATION_SPRAY | Freq: Four times a day (QID) | RESPIRATORY_TRACT | 0 refills | Status: DC | PRN

## 2020-12-31 NOTE — Progress Notes (Signed)
Patient ID: Angela [name], female    DOB: [birth year], 75 y.o.   MRN: [id number]  Virtual visit completed through [employer], a video enabled telemedicine application. Due to national recommendations of social distancing due to COVID-19, a virtual visit is felt to be most appropriate for this patient at this time. Reviewed limitations, risks, security and privacy concerns of performing a virtual visit and the availability of in person appointments. I also reviewed that there may be a patient responsible charge related to this service. The patient agreed to proceed.   Patient location: home Provider location: [place] at [facility], office Persons participating in this virtual visit: patient, provider   If any vitals were documented, they were collected by patient at home unless specified below.    BP 115/65   Pulse 75 Comment: per patient  Temp (!) 97.1 F (36.2 C)    CC: Post Covid symptoms/ Cough Subjective:   HPI: Angela [name] is a 75 y.o. female presenting on 12/31/2020 for Cough (Better but not resolved. Wheezing present. A little bit lethargic and lightheaded feeling. She is drinking and eating as normal. Some SOB. Had COVID on 12/09/20.)    12/09/2020 tested positive for covid. States that she started feeling better and did an at home covid test that was negative  Past 3-4 days cough has continued nut has improved. She states that there is some wheezing which is new. DOE and lightheadedness that isn't new but more pronounced since covid.    Relevant past medical, surgical, family and social history reviewed and updated as indicated. Interim medical history since our last visit reviewed. Allergies and medications reviewed and updated. Outpatient Medications Prior to Visit  Medication Sig Dispense Refill   AMBULATORY NON FORMULARY MEDICATION Medication Name: FDgard samples given #32 lot number [id number]  Exp. 12/2016  gm (Patient taking differently: Take 1 tablet by mouth 2  (two) times daily. Medication Name: FDgard samples given #32 lot number [id number]  Exp. 12/2016  gm) 32 capsule 0   aspirin 81 MG tablet Take 81 mg by mouth daily.     cholestyramine (QUESTRAN) 4 g packet TAKE 1 PACKET (4 G TOTAL) BY MOUTH DAILY. 90 packet 2   COVID-19 mRNA Vac-TriS, Pfizer, (PFIZER-BIONT COVID-19 VAC-TRIS) SUSP injection Inject into the muscle. 0.3 mL 0   desonide (DESOWEN) 0.05 % cream Apply topically 2 (two) times daily. 30 g 0   esomeprazole (NEXIUM) 40 MG capsule TAKE 1 CAPSULE BY MOUTH 2 TIMES DAILY BEFORE A MEAL. 180 capsule 3   fluocinonide gel (LIDEX) 0.05 % fluocinonide 0.05 % topical gel  APPLY 4-5 APPLICATIONS DAILY AS NEEDED     ketoconazole (NIZORAL) 2 % cream Apply 1 application topically daily as needed for irritation.      latanoprost (XALATAN) 0.005 % ophthalmic solution latanoprost 0.005 % eye drops  PLACE 1 DROP IN BOTH EYES AT BEDTIME     levothyroxine (SYNTHROID) 50 MCG tablet TAKE 0.5 TABLETS (25 MCG TOTAL) BY MOUTH DAILY. 45 tablet 2   loratadine (CLARITIN) 10 MG tablet TAKE 1 TABLET EVERY DAY AS NEEDED FOR ALLEGIES 90 tablet 3   meloxicam (MOBIC) 15 MG tablet Take 15 mg by mouth daily.     metoprolol tartrate (LOPRESSOR) 50 MG tablet TAKE 1 TABLET BY MOUTH TWICE A DAY (Patient taking differently: Take 1 tablet every morning and 0.5 tablet every evening) 180 tablet 3   metroNIDAZOLE (METROCREAM) 0.75 % cream metronidazole 0.75 % topical cream     mometasone (  NASONEX) 50 MCG/ACT nasal spray Place 2 sprays into the nose daily.     NON FORMULARY FD Guard     PARoxetine (PAXIL) 10 MG tablet Paxil 10 mg tablet  Take 1 tablet every day by oral route.     traMADol (ULTRAM) 50 MG tablet Take 50 mg by mouth as needed.     triamcinolone cream (KENALOG) 0.1 % Apply 1 application topically 2 (two) times daily as needed (for breakout).      AZITHROMYCIN PO Take 250 mg by mouth. As directed for 5 days     benzonatate (TESSALON PERLES) 100 MG capsule Take 1 capsule (100  mg total) by mouth 3 (three) times daily as needed. 20 capsule 0   famotidine (PEPCID) 20 MG tablet Take 1 tablet (20 mg total) by mouth at bedtime. 90 tablet 1   No facility-administered medications prior to visit.     Per HPI unless specifically indicated in ROS section below Review of Systems  Constitutional:  Positive for fatigue.  Respiratory:  Positive for cough, shortness of breath (DOE) and wheezing.   Cardiovascular:  Negative for chest pain and palpitations.  Gastrointestinal:  Negative for abdominal pain, diarrhea, nausea and vomiting.  Neurological:  Positive for light-headedness (history of vertigo feels different). Negative for headaches.  Objective:  BP 115/65   Pulse 75 Comment: per patient  Temp (!) 97.1 F (36.2 C)   Wt Readings from Last 3 Encounters:  08/01/20 182 lb (82.6 kg)  08/01/20 182 lb (82.6 kg)  06/28/20 180 lb (81.6 kg)       Physical exam: Gen: alert, NAD, not ill appearing Pulm: speaks in complete sentences without increased work of breathing Psych: normal mood, normal thought content      Results for orders placed or performed in visit on 99/37/16  Basic Metabolic Panel  Result Value Ref Range   Glucose 105 (H) 65 - 99 mg/dL   BUN 13 8 - 27 mg/dL   Creatinine, Ser 0.82 0.57 - 1.00 mg/dL   eGFR 75 >59 mL/min/1.73   BUN/Creatinine Ratio 16 12 - 28   Sodium 142 134 - 144 mmol/L   Potassium 4.0 3.5 - 5.2 mmol/L   Chloride 103 96 - 106 mmol/L   CO2 23 20 - 29 mmol/L   Calcium 9.7 8.7 - 10.3 mg/dL   Assessment & Plan:   Problem List Items Addressed This Visit       Other   Cough    States she has had a cough since COVID has improved slightly but still there.  Coughing up some clear-colored phlegm did inform patient that viral coughs can last 4+ weeks gave option of watchful waiting or chest x-ray patient did mention that her family seems worried we will pursue chest x-ray.  Order placed.  Pending chest x-ray results      Relevant  Orders   DG Chest 2 View   Light-headed feeling   SOB (shortness of breath) on exertion    Patient states she has had diarrhea in the past as well as results COVID.  We will send an albuterol inhaler to help with her wheezing.  Continue to monitor      Wheezing - Primary    Patient states she has not noticed wheezing in the evenings or at night.  We will write her an albuterol inhaler to use as needed.  Did discuss side effects inclusive of tachycardia and tremors.  Patient acknowledged      Relevant Medications  albuterol (VENTOLIN HFA) 108 (90 Base) MCG/ACT inhaler     No orders of the defined types were placed in this encounter.  No orders of the defined types were placed in this encounter.   I discussed the assessment and treatment plan with the patient. The patient was provided an opportunity to ask questions and all were answered. The patient agreed with the plan and demonstrated an understanding of the instructions. The patient was advised to call back or seek an in-person evaluation if the symptoms worsen or if the condition fails to improve as anticipated.  Follow up plan: No follow-ups on file.  Romilda Garret, NP

## 2020-12-31 NOTE — Assessment & Plan Note (Signed)
Patient states she has had diarrhea in the past as well as results COVID.  We will send an albuterol inhaler to help with her wheezing.  Continue to monitor

## 2020-12-31 NOTE — Assessment & Plan Note (Signed)
Patient states she has not noticed wheezing in the evenings or at night.  We will write her an albuterol inhaler to use as needed.  Did discuss side effects inclusive of tachycardia and tremors.  Patient acknowledged

## 2020-12-31 NOTE — Assessment & Plan Note (Signed)
States she has had a cough since COVID has improved slightly but still there.  Coughing up some clear-colored phlegm did inform patient that viral coughs can last 4+ weeks gave option of watchful waiting or chest x-ray patient did mention that her family seems worried we will pursue chest x-ray.  Order placed.  Pending chest x-ray results

## 2021-01-01 ENCOUNTER — Ambulatory Visit
Admission: RE | Admit: 2021-01-01 | Discharge: 2021-01-01 | Disposition: A | Discharge status: Home / Self Care | Payer: Medicare PPO | Source: Ambulatory Visit | Attending: Nurse Practitioner | Admitting: Nurse Practitioner

## 2021-01-01 ENCOUNTER — Other Ambulatory Visit: Payer: Self-pay

## 2021-01-01 DIAGNOSIS — R059 Cough, unspecified: Secondary | ICD-10-CM | POA: Diagnosis not present

## 2021-01-17 DIAGNOSIS — G8929 Other chronic pain: Secondary | ICD-10-CM | POA: Diagnosis not present

## 2021-01-17 DIAGNOSIS — E669 Obesity, unspecified: Secondary | ICD-10-CM | POA: Diagnosis not present

## 2021-01-17 DIAGNOSIS — I773 Arterial fibromuscular dysplasia: Secondary | ICD-10-CM | POA: Diagnosis not present

## 2021-01-17 DIAGNOSIS — E039 Hypothyroidism, unspecified: Secondary | ICD-10-CM | POA: Diagnosis not present

## 2021-01-17 DIAGNOSIS — I672 Cerebral atherosclerosis: Secondary | ICD-10-CM | POA: Diagnosis not present

## 2021-01-17 DIAGNOSIS — F419 Anxiety disorder, unspecified: Secondary | ICD-10-CM | POA: Diagnosis not present

## 2021-01-17 DIAGNOSIS — I10 Essential (primary) hypertension: Secondary | ICD-10-CM | POA: Diagnosis not present

## 2021-01-17 DIAGNOSIS — H409 Unspecified glaucoma: Secondary | ICD-10-CM | POA: Diagnosis not present

## 2021-01-17 DIAGNOSIS — E785 Hyperlipidemia, unspecified: Secondary | ICD-10-CM | POA: Diagnosis not present

## 2021-01-24 ENCOUNTER — Other Ambulatory Visit: Payer: Self-pay | Admitting: Internal Medicine

## 2021-02-13 DIAGNOSIS — L814 Other melanin hyperpigmentation: Secondary | ICD-10-CM | POA: Diagnosis not present

## 2021-02-13 DIAGNOSIS — L578 Other skin changes due to chronic exposure to nonionizing radiation: Secondary | ICD-10-CM | POA: Diagnosis not present

## 2021-02-13 DIAGNOSIS — L439 Lichen planus, unspecified: Secondary | ICD-10-CM | POA: Diagnosis not present

## 2021-02-13 DIAGNOSIS — Z23 Encounter for immunization: Secondary | ICD-10-CM | POA: Diagnosis not present

## 2021-02-13 DIAGNOSIS — L821 Other seborrheic keratosis: Secondary | ICD-10-CM | POA: Diagnosis not present

## 2021-02-13 DIAGNOSIS — L700 Acne vulgaris: Secondary | ICD-10-CM | POA: Diagnosis not present

## 2021-02-18 ENCOUNTER — Ambulatory Visit: Payer: Medicare PPO | Admitting: Internal Medicine

## 2021-02-20 DIAGNOSIS — M25512 Pain in left shoulder: Secondary | ICD-10-CM | POA: Diagnosis not present

## 2021-02-20 DIAGNOSIS — M542 Cervicalgia: Secondary | ICD-10-CM | POA: Diagnosis not present

## 2021-02-21 ENCOUNTER — Ambulatory Visit (INDEPENDENT_AMBULATORY_CARE_PROVIDER_SITE_OTHER): Payer: Medicare PPO | Admitting: Internal Medicine

## 2021-02-21 ENCOUNTER — Encounter: Payer: Self-pay | Admitting: Internal Medicine

## 2021-02-21 ENCOUNTER — Other Ambulatory Visit: Payer: Self-pay

## 2021-02-21 DIAGNOSIS — R0781 Pleurodynia: Secondary | ICD-10-CM | POA: Diagnosis not present

## 2021-02-21 NOTE — Progress Notes (Signed)
   Subjective:   Patient ID: Angela [name], female    DOB: [birth year], 75 y.o.   MRN: [id number]  HPI The patient is a 75 YO female coming in for resolving right flank pain after fall. Fall in March and another fall since that time.   Review of Systems  Constitutional: Negative.   HENT: Negative.    Eyes: Negative.   Respiratory:  Negative for cough, chest tightness and shortness of breath.   Cardiovascular:  Negative for chest pain, palpitations and leg swelling.  Gastrointestinal:  Negative for abdominal distention, abdominal pain, constipation, diarrhea, nausea and vomiting.  Genitourinary:  Positive for flank pain.  Skin: Negative.   Neurological: Negative.   Psychiatric/Behavioral: Negative.     Objective:  Physical Exam Constitutional:      Appearance: She is well-developed.  HENT:     Head: Normocephalic and atraumatic.  Cardiovascular:     Rate and Rhythm: Normal rate and regular rhythm.     Comments: Pain to touch right inferior rib cage Pulmonary:     Effort: Pulmonary effort is normal. No respiratory distress.     Breath sounds: Normal breath sounds. No wheezing or rales.  Abdominal:     General: Bowel sounds are normal. There is no distension.     Palpations: Abdomen is soft.     Tenderness: There is no abdominal tenderness. There is no rebound.  Musculoskeletal:     Cervical back: Normal range of motion.  Skin:    General: Skin is warm and dry.  Neurological:     Mental Status: She is alert and oriented to person, place, and time.     Coordination: Coordination normal.    Vitals:   02/21/21 1050  BP: 126/68  Pulse: 71  Resp: 18  SpO2: 96%  Weight: 173 lb 12.8 oz (78.8 kg)  Height: 5\' 2"  (1.575 m)    This visit occurred during the SARS-CoV-2 public health emergency.  Safety protocols were in place, including screening questions prior to the visit, additional usage of staff PPE, and extensive cleaning of exam room while observing appropriate contact  time as indicated for disinfecting solutions.   Assessment & Plan:  Visit time 18 minutes in face to face communication with patient and coordination of care, additional 6 minutes spent in record review, coordination or care, ordering tests, communicating/referring to other healthcare professionals, documenting in medical records all on the same day of the visit for total time 24 minutes spent on the visit.

## 2021-02-21 NOTE — Patient Instructions (Signed)
You can use the tramadol for the pain in the side.

## 2021-02-22 DIAGNOSIS — R0781 Pleurodynia: Secondary | ICD-10-CM | POA: Insufficient documentation

## 2021-02-22 NOTE — Assessment & Plan Note (Signed)
Suspect bruised rib or muscle strain. There is no evidence for fracture on exam and advised her that x-ray would not affect treatment. She is using otc tylenol for pain so advised to continue using this and heat.

## 2021-02-27 DIAGNOSIS — H5213 Myopia, bilateral: Secondary | ICD-10-CM | POA: Diagnosis not present

## 2021-02-27 DIAGNOSIS — H2513 Age-related nuclear cataract, bilateral: Secondary | ICD-10-CM | POA: Diagnosis not present

## 2021-02-27 DIAGNOSIS — H43813 Vitreous degeneration, bilateral: Secondary | ICD-10-CM | POA: Diagnosis not present

## 2021-02-27 DIAGNOSIS — H401131 Primary open-angle glaucoma, bilateral, mild stage: Secondary | ICD-10-CM | POA: Diagnosis not present

## 2021-03-14 DIAGNOSIS — H2511 Age-related nuclear cataract, right eye: Secondary | ICD-10-CM | POA: Diagnosis not present

## 2021-03-14 DIAGNOSIS — H25811 Combined forms of age-related cataract, right eye: Secondary | ICD-10-CM | POA: Diagnosis not present

## 2021-03-14 DIAGNOSIS — H25011 Cortical age-related cataract, right eye: Secondary | ICD-10-CM | POA: Diagnosis not present

## 2021-03-22 DIAGNOSIS — H02052 Trichiasis without entropian right lower eyelid: Secondary | ICD-10-CM | POA: Diagnosis not present

## 2021-04-08 ENCOUNTER — Other Ambulatory Visit: Payer: Self-pay | Admitting: Internal Medicine

## 2021-04-26 DIAGNOSIS — M7062 Trochanteric bursitis, left hip: Secondary | ICD-10-CM | POA: Diagnosis not present

## 2021-05-09 DIAGNOSIS — H2512 Age-related nuclear cataract, left eye: Secondary | ICD-10-CM | POA: Diagnosis not present

## 2021-05-09 DIAGNOSIS — H25812 Combined forms of age-related cataract, left eye: Secondary | ICD-10-CM | POA: Diagnosis not present

## 2021-05-09 DIAGNOSIS — H25012 Cortical age-related cataract, left eye: Secondary | ICD-10-CM | POA: Diagnosis not present

## 2021-05-23 ENCOUNTER — Telehealth: Payer: Self-pay

## 2021-05-23 NOTE — Telephone Encounter (Signed)
Pt is requesting to be changed from metoprolol tartrate (LOPRESSOR) 50 MG tablet due to the interaction that it has with PARoxetine (PAXIL) 10 MG tablet that she takes for Hot flashes.   Pt states she went on a road trip and didn't take the metoprolol tartrate (LOPRESSOR) 50 MG tablet and she didn't experience any hot flashes.   Pt CB [phone]

## 2021-05-24 NOTE — Telephone Encounter (Signed)
The metoprolol is helping to treat her heart palpitations and blood pressure so I would recommend visit to discuss.

## 2021-05-27 DIAGNOSIS — M7062 Trochanteric bursitis, left hip: Secondary | ICD-10-CM | POA: Diagnosis not present

## 2021-05-31 DIAGNOSIS — M7062 Trochanteric bursitis, left hip: Secondary | ICD-10-CM | POA: Diagnosis not present

## 2021-05-31 DIAGNOSIS — M6281 Muscle weakness (generalized): Secondary | ICD-10-CM | POA: Diagnosis not present

## 2021-06-04 ENCOUNTER — Other Ambulatory Visit: Payer: Self-pay | Admitting: Gastroenterology

## 2021-06-10 ENCOUNTER — Ambulatory Visit: Payer: Medicare PPO | Admitting: Internal Medicine

## 2021-06-10 ENCOUNTER — Other Ambulatory Visit: Payer: Self-pay

## 2021-06-10 ENCOUNTER — Encounter: Payer: Self-pay | Admitting: Internal Medicine

## 2021-06-10 DIAGNOSIS — I773 Arterial fibromuscular dysplasia: Secondary | ICD-10-CM

## 2021-06-10 DIAGNOSIS — Z6833 Body mass index (BMI) 33.0-33.9, adult: Secondary | ICD-10-CM | POA: Diagnosis not present

## 2021-06-10 DIAGNOSIS — G43809 Other migraine, not intractable, without status migrainosus: Secondary | ICD-10-CM

## 2021-06-10 DIAGNOSIS — E6609 Other obesity due to excess calories: Secondary | ICD-10-CM | POA: Diagnosis not present

## 2021-06-10 DIAGNOSIS — N393 Stress incontinence (female) (male): Secondary | ICD-10-CM | POA: Diagnosis not present

## 2021-06-10 DIAGNOSIS — I10 Essential (primary) hypertension: Secondary | ICD-10-CM | POA: Diagnosis not present

## 2021-06-10 DIAGNOSIS — E669 Obesity, unspecified: Secondary | ICD-10-CM | POA: Insufficient documentation

## 2021-06-10 DIAGNOSIS — R232 Flushing: Secondary | ICD-10-CM

## 2021-06-10 MED ORDER — BUPROPION HCL ER (XL) 150 MG PO TB24: 150.0000 mg | ORAL_TABLET | Freq: Every day | ORAL | 1 refills | Status: DC

## 2021-06-10 NOTE — Assessment & Plan Note (Signed)
Reviewed that she should keep metoprolol 50 mg qam and 25 mg qpm as she is at goal. She did previously have bothersome PVCs so beta blocker is a good choice. She did remember those and want to continue. There is interaction with paxil which can increase levels of metoprolol. She will need close monitoring as we are stopping paxil and starting wellbutrin which has similar interaction. Needs repeat BP and HR check with change.

## 2021-06-10 NOTE — Assessment & Plan Note (Signed)
Discussed with her in the past and this is mild overall with minimal leaking. She does reduce fluid intake around traveling to help. Discussed myrbetriq and she does not wish medication at this time. Given kegel exercises which she will try. Slight worsening since several years ago with this problem.

## 2021-06-10 NOTE — Assessment & Plan Note (Signed)
Is on metoprolol 50 mg qam and 25 mg qpm for prevention and no recent migraines. She is concerned about interaction with paxil and we did review and typically this can increase the potency of metoprolol. Since her BP and HR are at goal dosing does not need to be adjusted. We did stop paxil and replace with wellbutrin as documented in other problems with similar monitoring needed.

## 2021-06-10 NOTE — Assessment & Plan Note (Signed)
Overall unable to lose weight and has tried on her own. Would like to try wellbutrin to help with appetite and food cravings. Rx wellbtrin 150 mg daily. Return in 3 months for weight. Counseled about diet.

## 2021-06-10 NOTE — Assessment & Plan Note (Signed)
We will stop paxil 10 mg daily as this is not effective and replace with wellbutrin 150 mg daily which is prescribed. This will help with weight as well.

## 2021-06-10 NOTE — Assessment & Plan Note (Signed)
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 She does take metoprolol for this to maintain BP at <130/80 and is at BP goal today. Continue metoprolol 50 mg qam and 25 mg qpm.

## 2021-06-10 NOTE — Patient Instructions (Signed)
We will have you stop taking the paroxetine (paxil). You can start taking wellbutrin to help with the hot flashes and maybe the appetite as well. Do not take paxil and wellbutrin on the same day.

## 2021-06-10 NOTE — Progress Notes (Signed)
° °  Subjective:   Patient ID: Angela [name], female    DOB: Oct 20, 1945, 76 y.o.   MRN: [id number]  Urinary Frequency  Associated symptoms include frequency. Pertinent negatives include no nausea or vomiting.  The patient is a 76 YO female coming in for discussion about medications.  Review of Systems  Constitutional:  Positive for unexpected weight change.  HENT: Negative.    Eyes: Negative.   Respiratory:  Negative for cough, chest tightness and shortness of breath.   Cardiovascular:  Negative for chest pain, palpitations and leg swelling.  Gastrointestinal:  Negative for abdominal distention, abdominal pain, constipation, diarrhea, nausea and vomiting.  Endocrine: Positive for heat intolerance.  Genitourinary:  Positive for frequency.  Musculoskeletal: Negative.   Skin: Negative.   Neurological: Negative.   Psychiatric/Behavioral: Negative.     Objective:  Physical Exam Constitutional:      Appearance: She is well-developed.     Comments: overweight  HENT:     Head: Normocephalic and atraumatic.  Cardiovascular:     Rate and Rhythm: Normal rate and regular rhythm.  Pulmonary:     Effort: Pulmonary effort is normal. No respiratory distress.     Breath sounds: Normal breath sounds. No wheezing or rales.  Abdominal:     General: Bowel sounds are normal. There is no distension.     Palpations: Abdomen is soft.     Tenderness: There is no abdominal tenderness. There is no rebound.  Musculoskeletal:     Cervical back: Normal range of motion.  Skin:    General: Skin is warm and dry.  Neurological:     Mental Status: She is alert and oriented to person, place, and time.     Coordination: Coordination normal.    Vitals:   06/10/21 0831  BP: 128/74  Pulse: 65  Temp: 98.5 F (36.9 C)  TempSrc: Oral  SpO2: 98%  Weight: 184 lb (83.5 kg)  Height: 5\' 2"  (1.575 m)    This visit occurred during the SARS-CoV-2 public health emergency.  Safety protocols were in place,  including screening questions prior to the visit, additional usage of staff PPE, and extensive cleaning of exam room while observing appropriate contact time as indicated for disinfecting solutions.   Assessment & Plan:

## 2021-06-24 ENCOUNTER — Telehealth: Payer: Self-pay

## 2021-06-24 NOTE — Telephone Encounter (Signed)
Spoke with the pt and she d/c the medication. She stated that she would call back to make a follow up appt.  ?

## 2021-06-24 NOTE — Telephone Encounter (Signed)
Pt is calling stating that she is having a reaction to buPROPion (WELLBUTRIN XL) 150 MG 24 hr tablet. Pt states that she has been having heart palpitations and still sweating. ? ?Pt CB [phone] ? ?Please advise ?

## 2021-06-24 NOTE — Telephone Encounter (Signed)
Hello - though these may be further symptoms of anxiety that the medication is supposed to treat, ok to d/c the medication for now, and f/u with PCP next available appt,   thanks ?

## 2021-07-25 DIAGNOSIS — I10 Essential (primary) hypertension: Secondary | ICD-10-CM | POA: Diagnosis not present

## 2021-07-25 DIAGNOSIS — Z7982 Long term (current) use of aspirin: Secondary | ICD-10-CM | POA: Diagnosis not present

## 2021-07-25 DIAGNOSIS — I6523 Occlusion and stenosis of bilateral carotid arteries: Secondary | ICD-10-CM | POA: Diagnosis not present

## 2021-07-31 DIAGNOSIS — M797 Fibromyalgia: Secondary | ICD-10-CM | POA: Diagnosis not present

## 2021-07-31 DIAGNOSIS — I10 Essential (primary) hypertension: Secondary | ICD-10-CM | POA: Diagnosis not present

## 2021-07-31 DIAGNOSIS — I679 Cerebrovascular disease, unspecified: Secondary | ICD-10-CM | POA: Diagnosis not present

## 2021-07-31 DIAGNOSIS — R42 Dizziness and giddiness: Secondary | ICD-10-CM | POA: Diagnosis not present

## 2021-07-31 DIAGNOSIS — I773 Arterial fibromuscular dysplasia: Secondary | ICD-10-CM | POA: Diagnosis not present

## 2021-10-08 DIAGNOSIS — Z01419 Encounter for gynecological examination (general) (routine) without abnormal findings: Secondary | ICD-10-CM | POA: Diagnosis not present

## 2021-10-09 DIAGNOSIS — Z1231 Encounter for screening mammogram for malignant neoplasm of breast: Secondary | ICD-10-CM | POA: Diagnosis not present

## 2021-10-24 ENCOUNTER — Other Ambulatory Visit: Payer: Self-pay | Admitting: Gastroenterology

## 2021-11-02 DIAGNOSIS — M25571 Pain in right ankle and joints of right foot: Secondary | ICD-10-CM | POA: Diagnosis not present

## 2021-11-02 DIAGNOSIS — M25561 Pain in right knee: Secondary | ICD-10-CM | POA: Diagnosis not present

## 2021-11-04 ENCOUNTER — Encounter: Payer: Self-pay | Admitting: Internal Medicine

## 2021-11-04 ENCOUNTER — Ambulatory Visit: Payer: Medicare PPO | Admitting: Internal Medicine

## 2021-11-04 DIAGNOSIS — J0110 Acute frontal sinusitis, unspecified: Secondary | ICD-10-CM

## 2021-11-04 MED ORDER — AMOXICILLIN-POT CLAVULANATE 875-125 MG PO TABS: 1.0000 | ORAL_TABLET | Freq: Two times a day (BID) | ORAL | 0 refills | Status: AC

## 2021-11-04 MED ORDER — PREDNISONE 20 MG PO TABS: 40.0000 mg | ORAL_TABLET | Freq: Every day | ORAL | 0 refills | Status: DC

## 2021-11-04 NOTE — Progress Notes (Signed)
   Subjective:   Patient ID: Angela [name], female    DOB: 1945-08-28, 76 y.o.   MRN: [id number]  HPI The patient is a 76 YO female coming in for 2-3 weeks of sinus symptoms and cough.  Review of Systems  Constitutional:  Negative for activity change, appetite change, chills, fatigue, fever and unexpected weight change.  HENT:  Positive for congestion, postnasal drip, rhinorrhea and sinus pressure. Negative for ear discharge, ear pain, sinus pain, sneezing, sore throat, tinnitus, trouble swallowing and voice change.   Eyes: Negative.   Respiratory:  Positive for cough. Negative for chest tightness, shortness of breath and wheezing.   Cardiovascular: Negative.   Gastrointestinal: Negative.   Musculoskeletal:  Positive for myalgias.  Neurological: Negative.     Objective:  Physical Exam Constitutional:      Appearance: She is well-developed.  HENT:     Head: Normocephalic and atraumatic.     Comments: Oropharynx with redness and clear drainage, nose with swollen turbinates, TMs normal bilaterally.  Neck:     Thyroid: No thyromegaly.  Cardiovascular:     Rate and Rhythm: Normal rate and regular rhythm.  Pulmonary:     Effort: Pulmonary effort is normal. No respiratory distress.     Breath sounds: Normal breath sounds. No wheezing or rales.  Abdominal:     Palpations: Abdomen is soft.  Musculoskeletal:        General: Tenderness present.     Cervical back: Normal range of motion.  Lymphadenopathy:     Cervical: No cervical adenopathy.  Skin:    General: Skin is warm and dry.  Neurological:     Mental Status: She is alert and oriented to person, place, and time.     Vitals:   11/04/21 1425  BP: 114/70  Pulse: 65  Temp: 98.6 F (37 C)  TempSrc: Oral  SpO2: 91%  Weight: 181 lb (82.1 kg)  Height: 5\' 2"  (1.575 m)    Assessment & Plan:

## 2021-11-04 NOTE — Patient Instructions (Signed)
We have sent in augmentin to take 1 pill twice a day for 1 week.   We have also sent in prednisone to take 2 pills daily for 5 days.    

## 2021-11-07 NOTE — Assessment & Plan Note (Signed)
Rx augmentin 1 week and prednisone 5 days. Suspect she has some inflammation from air quality issues causing this infection.

## 2021-11-11 DIAGNOSIS — Z881 Allergy status to other antibiotic agents status: Secondary | ICD-10-CM | POA: Diagnosis not present

## 2021-11-11 DIAGNOSIS — Z791 Long term (current) use of non-steroidal anti-inflammatories (NSAID): Secondary | ICD-10-CM | POA: Diagnosis not present

## 2021-11-11 DIAGNOSIS — E039 Hypothyroidism, unspecified: Secondary | ICD-10-CM | POA: Diagnosis not present

## 2021-11-11 DIAGNOSIS — Z888 Allergy status to other drugs, medicaments and biological substances status: Secondary | ICD-10-CM | POA: Diagnosis not present

## 2021-11-11 DIAGNOSIS — Z7982 Long term (current) use of aspirin: Secondary | ICD-10-CM | POA: Diagnosis not present

## 2021-11-11 DIAGNOSIS — I10 Essential (primary) hypertension: Secondary | ICD-10-CM | POA: Diagnosis not present

## 2021-11-11 DIAGNOSIS — K219 Gastro-esophageal reflux disease without esophagitis: Secondary | ICD-10-CM | POA: Diagnosis not present

## 2021-11-11 DIAGNOSIS — Z823 Family history of stroke: Secondary | ICD-10-CM | POA: Diagnosis not present

## 2021-11-11 DIAGNOSIS — J309 Allergic rhinitis, unspecified: Secondary | ICD-10-CM | POA: Diagnosis not present

## 2021-11-11 DIAGNOSIS — H409 Unspecified glaucoma: Secondary | ICD-10-CM | POA: Diagnosis not present

## 2021-11-11 DIAGNOSIS — L709 Acne, unspecified: Secondary | ICD-10-CM | POA: Diagnosis not present

## 2021-11-11 DIAGNOSIS — Z6833 Body mass index (BMI) 33.0-33.9, adult: Secondary | ICD-10-CM | POA: Diagnosis not present

## 2021-11-11 DIAGNOSIS — G8929 Other chronic pain: Secondary | ICD-10-CM | POA: Diagnosis not present

## 2021-11-11 DIAGNOSIS — E669 Obesity, unspecified: Secondary | ICD-10-CM | POA: Diagnosis not present

## 2021-11-11 DIAGNOSIS — Z885 Allergy status to narcotic agent status: Secondary | ICD-10-CM | POA: Diagnosis not present

## 2021-11-11 DIAGNOSIS — K589 Irritable bowel syndrome without diarrhea: Secondary | ICD-10-CM | POA: Diagnosis not present

## 2021-11-11 DIAGNOSIS — Z8249 Family history of ischemic heart disease and other diseases of the circulatory system: Secondary | ICD-10-CM | POA: Diagnosis not present

## 2021-11-11 DIAGNOSIS — Z825 Family history of asthma and other chronic lower respiratory diseases: Secondary | ICD-10-CM | POA: Diagnosis not present

## 2021-11-27 DIAGNOSIS — S86812A Strain of other muscle(s) and tendon(s) at lower leg level, left leg, initial encounter: Secondary | ICD-10-CM | POA: Diagnosis not present

## 2021-12-02 DIAGNOSIS — M79662 Pain in left lower leg: Secondary | ICD-10-CM | POA: Diagnosis not present

## 2021-12-03 ENCOUNTER — Other Ambulatory Visit (INDEPENDENT_AMBULATORY_CARE_PROVIDER_SITE_OTHER): Payer: Medicare PPO

## 2021-12-03 ENCOUNTER — Encounter: Payer: Self-pay | Admitting: Nurse Practitioner

## 2021-12-03 ENCOUNTER — Ambulatory Visit (INDEPENDENT_AMBULATORY_CARE_PROVIDER_SITE_OTHER): Payer: Medicare PPO | Admitting: Nurse Practitioner

## 2021-12-03 VITALS — BP 110/60 | HR 76 | Ht 62.0 in | Wt 185.8 lb

## 2021-12-03 DIAGNOSIS — R1032 Left lower quadrant pain: Secondary | ICD-10-CM

## 2021-12-03 DIAGNOSIS — R1012 Left upper quadrant pain: Secondary | ICD-10-CM | POA: Diagnosis not present

## 2021-12-03 LAB — CBC WITH DIFFERENTIAL/PLATELET
Basophils Absolute: 0.1 10*3/uL (ref 0.0–0.1)
Basophils Relative: 0.9 % (ref 0.0–3.0)
Eosinophils Absolute: 0.2 10*3/uL (ref 0.0–0.7)
Eosinophils Relative: 3.3 % (ref 0.0–5.0)
HCT: 38.4 % (ref 36.0–46.0)
Hemoglobin: 13.1 g/dL (ref 12.0–15.0)
Lymphocytes Relative: 46.7 % — ABNORMAL HIGH (ref 12.0–46.0)
Lymphs Abs: 2.9 10*3/uL (ref 0.7–4.0)
MCHC: 34.2 g/dL (ref 30.0–36.0)
MCV: 94.6 fl (ref 78.0–100.0)
Monocytes Absolute: 0.7 10*3/uL (ref 0.1–1.0)
Monocytes Relative: 11 % (ref 3.0–12.0)
Neutro Abs: 2.4 10*3/uL (ref 1.4–7.7)
Neutrophils Relative %: 38.1 % — ABNORMAL LOW (ref 43.0–77.0)
Platelets: 290 10*3/uL (ref 150.0–400.0)
RBC: 4.05 Mil/uL (ref 3.87–5.11)
RDW: 14.1 % (ref 11.5–15.5)
WBC: 6.2 10*3/uL (ref 4.0–10.5)

## 2021-12-03 LAB — LIPASE: Lipase: 49 U/L (ref 11.0–59.0)

## 2021-12-03 MED ORDER — FAMOTIDINE 20 MG PO TABS: 20.0000 mg | ORAL_TABLET | Freq: Every day | ORAL | 2 refills | Status: DC

## 2021-12-03 NOTE — Progress Notes (Signed)
Attending Physician's Attestation   I have reviewed the chart.   I agree with the Advanced Practitioner's note, impression, and recommendations with any updates as below. Further management with additional PPI and H2 RA blocker seems reasonable.  Agree with FODMAP diet as well as Gas-X trial.  SIBO breath testing and EPI evaluation with fecal elastase testing can also be considered if patient continues to have symptoms.  Endoscopic reevaluation could be considered if necessary.   Corliss Parish, MD [facility] Office # [phone]

## 2021-12-03 NOTE — Patient Instructions (Signed)
1) TAKE GAS X ONE TAB TWICE DAILY   2) FAMOTIDINE 20MG ONE TAB AT BED TIME, A PRESCRIPTION WAS SENT TO YOUR PHARMACY   3) CONTINUE PANTOPRAZOLE 40MG ONE CAPSULE TWICE DAILY   Low-FODMAP Eating Plan  FODMAP stands for fermentable oligosaccharides, disaccharides, monosaccharides, and polyols. These are sugars that are hard for some people to digest. A low-FODMAP eating plan may help some people who have irritable bowel syndrome (IBS) and certain other bowel (intestinal) diseases to manage their symptoms. This meal plan can be complicated to follow. Work with a diet and nutrition specialist (dietitian) to make a low-FODMAP eating plan that is right for you. A dietitian can help make sure that you get enough nutrition from this diet. What are tips for following this plan? Reading food labels Check labels for hidden FODMAPs such as: High-fructose syrup. Honey. Agave. Natural fruit flavors. Onion or garlic powder. Choose low-FODMAP foods that contain 3-4 grams of fiber per serving. Check food labels for serving sizes. Eat only one serving at a time to make sure FODMAP levels stay low. Shopping Shop with a list of foods that are recommended on this diet and make a meal plan. Meal planning Follow a low-FODMAP eating plan for up to 6 weeks, or as told by your health care provider or dietitian. To follow the eating plan: Eliminate high-FODMAP foods from your diet completely. Choose only low-FODMAP foods to eat. You will do this for 2-6 weeks. Gradually reintroduce high-FODMAP foods into your diet one at a time. Most people should wait a few days before introducing the next new high-FODMAP food into their meal plan. Your dietitian can recommend how quickly you may reintroduce foods. Keep a daily record of what and how much you eat and drink. Make note of any symptoms that you have after eating. Review your daily record with a dietitian regularly to identify which foods you can eat and which foods  you should avoid. General tips Drink enough fluid each day to keep your urine pale yellow. Avoid processed foods. These often have added sugar and may be high in FODMAPs. Avoid most dairy products, whole grains, and sweeteners. Work with a dietitian to make sure you get enough fiber in your diet. Avoid high FODMAP foods at meals to manage symptoms. Recommended foods Fruits Bananas, oranges, tangerines, lemons, limes, blueberries, raspberries, strawberries, grapes, cantaloupe, honeydew melon, kiwi, papaya, passion fruit, and pineapple. Limited amounts of dried cranberries, banana chips, and shredded coconut. Vegetables Eggplant, zucchini, cucumber, peppers, green beans, bean sprouts, lettuce, arugula, kale, Swiss chard, spinach, collard greens, bok choy, summer squash, potato, and tomato. Limited amounts of corn, carrot, and sweet potato. Green parts of scallions. Grains Gluten-free grains, such as rice, oats, buckwheat, quinoa, corn, polenta, and millet. Gluten-free pasta, bread, or cereal. Rice noodles. Corn tortillas. Meats and other proteins Unseasoned beef, pork, poultry, or fish. Eggs. Berniece Salines. Tofu (firm) and tempeh. Limited amounts of nuts and seeds, such as almonds, walnuts, [place] nuts, pecans, peanuts, nut butters, pumpkin seeds, chia seeds, and sunflower seeds. Dairy Lactose-free milk, yogurt, and kefir. Lactose-free cottage cheese and ice cream. Non-dairy milks, such as almond, coconut, hemp, and rice milk. Non-dairy yogurt. Limited amounts of goat cheese, brie, mozzarella, parmesan, swiss, and other hard cheeses. Fats and oils Butter-free spreads. Vegetable oils, such as olive, canola, and sunflower oil. Seasoning and other foods Artificial sweeteners with names that do not end in "ol," such as aspartame, saccharine, and stevia. Maple syrup, white table sugar, raw sugar, Pieczynski sugar,  and molasses. Mayonnaise, soy sauce, and tamari. Fresh basil, coriander, parsley, rosemary, and  thyme. Beverages Water and mineral water. Sugar-sweetened soft drinks. Small amounts of orange juice or cranberry juice. Black and green tea. Most dry wines. Coffee. The items listed above may not be a complete list of foods and beverages you can eat. Contact a dietitian for more information. Foods to avoid Fruits Fresh, dried, and juiced forms of apple, pear, watermelon, peach, plum, cherries, apricots, blackberries, boysenberries, figs, nectarines, and mango. Avocado. Vegetables Chicory root, artichoke, asparagus, cabbage, snow peas, Brussels sprouts, broccoli, sugar snap peas, mushrooms, celery, and cauliflower. Onions, garlic, leeks, and the white part of scallions. Grains Wheat, including kamut, durum, and semolina. Barley and bulgur. Couscous. Wheat-based cereals. Wheat noodles, bread, crackers, and pastries. Meats and other proteins Fried or fatty meat. Sausage. Cashews and pistachios. Soybeans, baked beans, black beans, chickpeas, kidney beans, fava beans, navy beans, lentils, black-eyed peas, and split peas. Dairy Milk, yogurt, ice cream, and soft cheese. Cream and sour cream. Milk-based sauces. Custard. Buttermilk. Soy milk. Seasoning and other foods Any sugar-free gum or candy. Foods that contain artificial sweeteners such as sorbitol, mannitol, isomalt, or xylitol. Foods that contain honey, high-fructose corn syrup, or agave. Bouillon, vegetable stock, beef stock, and chicken stock. Garlic and onion powder. Condiments made with onion, such as hummus, chutney, pickles, relish, salad dressing, and salsa. Tomato paste. Beverages Chicory-based drinks. Coffee substitutes. Chamomile tea. Fennel tea. Sweet or fortified wines such as port or sherry. Diet soft drinks made with isomalt, mannitol, maltitol, sorbitol, or xylitol. Apple, pear, and mango juice. Juices with high-fructose corn syrup. The items listed above may not be a complete list of foods and beverages you should avoid. Contact a  dietitian for more information. Summary FODMAP stands for fermentable oligosaccharides, disaccharides, monosaccharides, and polyols. These are sugars that are hard for some people to digest. A low-FODMAP eating plan is a short-term diet that helps to ease symptoms of certain bowel diseases. The eating plan usually lasts up to 6 weeks. After that, high-FODMAP foods are reintroduced gradually and one at a time. This can help you find out which foods may be causing symptoms. A low-FODMAP eating plan can be complicated. It is best to work with a dietitian who has experience with this type of plan. This information is not intended to replace advice given to you by your health care provider. Make sure you discuss any questions you have with your health care provider. Document Revised: 08/18/2019 Document Reviewed: 08/18/2019 Elsevier Patient Education  [place].    If you are age 75 or older, your body mass index should be between 23-30. Your Body mass index is 33.98 kg/m. If this is out of the aforementioned range listed, please consider follow up with your Primary Care Provider. ________________________________________________________  The [facility] providers would like to encourage you to use [facility] to communicate with providers for non-urgent requests or questions.  Due to long hold times on the telephone, sending your provider a message by [facility] may be a faster and more efficient way to get a response.  Please allow 48 business hours for a response.  Please remember that this is for non-urgent requests.  _______________________________________________________  Dennis Bast have been scheduled for a CT scan of the abdomen and pelvis at [facility], 1st floor Radiology. You are scheduled on 12/12/2021 at 10:30 am. You should arrive 15 minutes prior to your appointment time for registration.  We are giving you 2 bottles of  contrast today that you will need to drink before arriving for the  exam. The solution may taste better if refrigerated so put them in the refrigerator when you get home, but do NOT add ice or any other liquid to this solution as that would dilute it. Shake well before drinking.   Please follow the written instructions below on the day of your exam:   1) Do not eat anything after 6:30 am (4 hours prior to your test)   2) Drink 1 bottle of contrast @ 8:30 am (2 hours prior to your exam)  Remember to shake well before drinking and do NOT pour over ice.     Drink 1 bottle of contrast @ 9:30 am (1 hour prior to your exam)   You may take any medications as prescribed with a small amount of water, if necessary. If you take any of the following medications: METFORMIN, GLUCOPHAGE, GLUCOVANCE, AVANDAMET, RIOMET, FORTAMET, [place] MET, JANUMET, GLUMETZA or METAGLIP, you MAY be asked to HOLD this medication 48 hours AFTER the exam.   The purpose of you drinking the oral contrast is to aid in the visualization of your intestinal tract. The contrast solution may cause some diarrhea. Depending on your individual set of symptoms, you may also receive an intravenous injection of x-ray contrast/dye. Plan on being at [facility] for 45 minutes or longer, depending on the type of exam you are having performed.   If you have any questions regarding your exam or if you need to reschedule, you may call [facility] at [phone] between the hours of 8:00 am and 5:00 pm, Monday-Friday.   Your provider has requested that you go to the basement level for lab work before leaving today. Press "B" on the elevator. The lab is located at the first door on the left as you exit the elevator.

## 2021-12-03 NOTE — Progress Notes (Signed)
12/03/2021 Angela [name] [id number] 1945/10/01   Chief Complaint: Upper abdominal pain, bloat  History of Present Illness: Angela [name] a past medical history of  fibromyalgia, migraine headache, carotid artery hyperplasia, GERD, esophageal stricture, gastroparesis, hiatal hernia, diverticulitis 05/2015, IBS and colon polyps.  Past cholecystectomy C-section, tubal ligation and hysterectomy. She is followed by Dr. Ardis Hughs.  She presents today with complaints of upper abdominal discomfort which started 2 months ago.  She describes having abdominal gas bloat, a noisy rumbling stomach and upper abdominal discomfort which is more pronounced to the LUQ area.  She describes feeling as if gas gets stuck in the esophagus and she has difficulty burping.  She drinks ginger ale which induces burping with symptom relief.  No dysphagia or heartburn.  No nausea or vomiting.  However, she described 1 episode of liquid regurgitation a few days ago.  She sometimes feels a knot to her epigastric area.  She is taking Esomeprazole 40 mg twice daily.  She is lactose intolerant therefore she avoids dairy products.  Her most recent EGD was 02/15/2020 which showed gastritis without evidence of H. pylori.  She is passing a normal formed [place] bowel movement as long as she stays on cholestyramine.  She has a small amount of bright red blood on the toilet tissue if she passes loose stools which she attributes to having external hemorrhoids.  Her most recent colonoscopy was 02/15/2020 which showed diverticulosis in the entire colon and small external hemorrhoids.  No fevers.  She reports having hot flashes for the past 6 to 8 years after HRT was discontinued.  She has intermittent night sweats for the past year.  No weight loss.  She strained her left calf muscle and is currently wearing an orthopedic boot.  She takes ASA 81 mg daily.  She also takes meloxicam 15 mg daily for arthritis pain.  He was prescribed a course of  prednisone and amoxicillin for a sinus infection by her PCP 11/04/2021.  Labs 11/24/2019: WBC 6.2.  Hemoglobin 12.5.  Hematocrit 37.3.  Platelet 286.  Glucose 118.  BUN 12.  Creatinine 0.9.  Total bili 0.4.  Alk phos 74.  AST 30.  ALT 30.  PAST GI PROCEDURES:  EGD 02/15/2020: Moderate, non-specific distal gastritis. Biopsied to check for H. pylori. - The examination was otherwise normal. ANTRAL AND OXYNTIC MUCOSA WITH SLIGHT CHRONIC INFLAMMATION AND REACTIVE CHANGES. - WARTHIN-STARRY NEGATIVE FOR HELICOBACTER PYLORI. - NO INTESTINAL METAPLASIA, DYSPLASIA OR CARCINOMA.  Colonoscopy 02/15/2020: - Diverticulosis in the entire examined colon. - Small external hemorrhoids. - The examination was otherwise normal on direct and retroflexion views. - No polyps or cancers.- -No further colon cancer screening  Colonoscopy 10/27/2011: -Diverticulosis throughout the colon -Excellent bowel prep -Recall colonoscopy 10 years   EGD 10/27/2011: -Mild gastritis in the antrum -Hiatal hernia -Normal duodenal folds -Normal esophagus   Current Outpatient Medications on File Prior to Visit  Medication Sig Dispense Refill   albuterol (VENTOLIN HFA) 108 (90 Base) MCG/ACT inhaler Inhale 2 puffs into the lungs every 6 (six) hours as needed for wheezing or shortness of breath. 8 g 0   aspirin 81 MG tablet Take 81 mg by mouth daily.     cholestyramine (QUESTRAN) 4 g packet TAKE 1 PACKET BY MOUTH DAILY. 90 packet 2   desonide (DESOWEN) 0.05 % cream Apply topically 2 (two) times daily. 30 g 0   esomeprazole (NEXIUM) 40 MG capsule TAKE 1 CAPSULE BY MOUTH 2 TIMES DAILY BEFORE A  MEAL. 180 capsule 1   fluocinonide gel (LIDEX) 0.05 % fluocinonide 0.05 % topical gel  APPLY 4-5 APPLICATIONS DAILY AS NEEDED     ketoconazole (NIZORAL) 2 % cream Apply 1 application topically daily as needed for irritation.      latanoprost (XALATAN) 0.005 % ophthalmic solution latanoprost 0.005 % eye drops  PLACE 1 DROP IN BOTH EYES AT  BEDTIME     levothyroxine (SYNTHROID) 50 MCG tablet TAKE 0.5 TABLETS (25 MCG TOTAL) BY MOUTH DAILY. 45 tablet 2   loratadine (CLARITIN) 10 MG tablet TAKE 1 TABLET EVERY DAY AS NEEDED FOR ALLEGIES 90 tablet 3   meloxicam (MOBIC) 15 MG tablet Take 15 mg by mouth daily.     metoprolol tartrate (LOPRESSOR) 50 MG tablet TAKE 1 TABLET BY MOUTH TWICE A DAY (Patient taking differently: Take 1 tablet every morning and 0.5 tablet every evening) 180 tablet 3   metroNIDAZOLE (METROCREAM) 0.75 % cream metronidazole 0.75 % topical cream     mometasone (NASONEX) 50 MCG/ACT nasal spray Place 2 sprays into the nose daily.     PARoxetine (PAXIL) 10 MG tablet Paxil 10 mg tablet  Take 1 tablet every day by oral route.     traMADol (ULTRAM) 50 MG tablet Take 50 mg by mouth as needed.     triamcinolone cream (KENALOG) 0.1 % Apply 1 application topically 2 (two) times daily as needed (for breakout).      No current facility-administered medications on file prior to visit.   Allergies  Allergen Reactions   Shellfish Allergy Anaphylaxis   Tetracycline Other (See Comments)    GI bleed   Effexor Xr [Venlafaxine Hcl] Other (See Comments)    Nightmare and vivid dreams   Codeine Nausea And Vomiting and Other (See Comments)    GI upset/vomit   Iohexol Hives    Per Dr.Grady patient doesn't need a 13 hour prep, only itching - patient already had a rash due to autoimmune condition //rls    Paroxetine Rash and Other (See Comments)   Current Medications, Allergies, Past Medical History, Past Surgical History, Family History and Social History were reviewed in [employer] record.  Review of Systems:   Constitutional: See HPI. Respiratory: Negative for shortness of breath.   Cardiovascular: Negative for chest pain, palpitations and leg swelling.  Gastrointestinal: See HPI.  Musculoskeletal: + Calf pain.  Neurological: Negative for dizziness, headaches or paresthesias.   Physical Exam: BP  110/60   Pulse 76   Ht _0  (1.575 m)   Wt 185 lb 12.8 oz (84.3 kg)   BMI 33.98 kg/m  Wt Readings from Last 3 Encounters:  12/03/21 185 lb 12.8 oz (84.3 kg)  11/04/21 181 lb (82.1 kg)  06/10/21 184 lb (83.5 kg)    General: 76 year old female in no acute distress. Head: Normocephalic and atraumatic. Eyes: No scleral icterus. Conjunctiva pink . Ears: Normal auditory acuity. Mouth: Dentition intact. No ulcers or lesions.  Lungs: Clear throughout to auscultation. Heart: Regular rate and rhythm, no murmur. Abdomen: Soft, nondistended. LUQ and LLQ tenderness without rebound or guarding. No masses or hepatomegaly. Normal bowel sounds x 4 quadrants.  Rectal: Deferred. Musculoskeletal: Left foot in an orthopedic boot. Extremities: No edema. Neurological: Alert oriented x 4. No focal deficits.  Psychological: Alert and cooperative. Normal mood and affect  Assessment and Recommendations:  65) 76 year old female with a history of GERD with increased burping and LUQ pain.  Sometimes feels a knot protruding from the epigastric area.  One  episode of regurgitation. EGD 02/2020 showed gastritis otherwise was normal. + Chronic NSAID use.  -Continue Esomeprazole 40 mg p.o. twice daily -Add Famotidine 20 mg nightly -Gas-X 1 tab p.o. twice daily -CBC, CMP and lipase level -CTAP with oral contrasts (allergic to IV contrast) -Consider repeat EGD if symptoms persist and CTAP unrevealing  2) Abdominal bloat.  She took a course of Augmentin for sinus infection and of July 2023.  Lactose intolerant.  Past hysterectomy. -FODMAP diet discussed, handout provided -Consider SIBO breath test if symptoms persist  3) Diverticulosis with LLQ tenderness on exam -CTAP as as ordered above, rule out diverticulitis  4) Night sweats, intermittent, x 1 year.  No fevers or weight loss. -Labs and CTAP as ordered above   5) Colon cancer screening.  Colonoscopy 02/2020 showed diverticulosis throughout the colon and  external hemorrhoids -No further colon cancer screening  6) Infrequent rectal bleeding which occurs after passing loose stools which she attributes to having external hemorrhoids. -Continue cholestyramine daily as tolerated to prevent loose stools -Patient to contact office if rectal bleeding recurs

## 2021-12-04 LAB — COMPLETE METABOLIC PANEL WITH GFR
AG Ratio: 1.8 (calc) (ref 1.0–2.5)
ALT: 33 U/L — ABNORMAL HIGH (ref 6–29)
AST: 47 U/L — ABNORMAL HIGH (ref 10–35)
Albumin: 4.3 g/dL (ref 3.6–5.1)
Alkaline phosphatase (APISO): 68 U/L (ref 37–153)
BUN: 12 mg/dL (ref 7–25)
CO2: 23 mmol/L (ref 20–32)
Calcium: 10 mg/dL (ref 8.6–10.4)
Chloride: 103 mmol/L (ref 98–110)
Creat: 0.93 mg/dL (ref 0.60–1.00)
Globulin: 2.4 g/dL (calc) (ref 1.9–3.7)
Glucose, Bld: 136 mg/dL — ABNORMAL HIGH (ref 65–99)
Potassium: 3.8 mmol/L (ref 3.5–5.3)
Sodium: 141 mmol/L (ref 135–146)
Total Bilirubin: 0.3 mg/dL (ref 0.2–1.2)
Total Protein: 6.7 g/dL (ref 6.1–8.1)
eGFR: 64 mL/min/{1.73_m2} (ref >=60)

## 2021-12-05 ENCOUNTER — Other Ambulatory Visit: Payer: Self-pay

## 2021-12-05 DIAGNOSIS — R7989 Other specified abnormal findings of blood chemistry: Secondary | ICD-10-CM

## 2021-12-12 ENCOUNTER — Ambulatory Visit (HOSPITAL_COMMUNITY)
Admission: RE | Admit: 2021-12-12 | Discharge: 2021-12-12 | Disposition: A | Discharge status: Home / Self Care | Payer: Medicare PPO | Source: Ambulatory Visit | Attending: Nurse Practitioner | Admitting: Nurse Practitioner

## 2021-12-12 DIAGNOSIS — R109 Unspecified abdominal pain: Secondary | ICD-10-CM | POA: Diagnosis not present

## 2021-12-12 DIAGNOSIS — R1032 Left lower quadrant pain: Secondary | ICD-10-CM | POA: Diagnosis not present

## 2021-12-18 DIAGNOSIS — S86912D Strain of unspecified muscle(s) and tendon(s) at lower leg level, left leg, subsequent encounter: Secondary | ICD-10-CM | POA: Diagnosis not present

## 2021-12-20 ENCOUNTER — Other Ambulatory Visit (INDEPENDENT_AMBULATORY_CARE_PROVIDER_SITE_OTHER): Payer: Medicare PPO

## 2021-12-20 ENCOUNTER — Telehealth: Payer: Self-pay

## 2021-12-20 DIAGNOSIS — R7989 Other specified abnormal findings of blood chemistry: Secondary | ICD-10-CM | POA: Diagnosis not present

## 2021-12-20 LAB — HEPATIC FUNCTION PANEL
ALT: 31 U/L (ref 0–35)
AST: 43 U/L — ABNORMAL HIGH (ref 0–37)
Albumin: 4.1 g/dL (ref 3.5–5.2)
Alkaline Phosphatase: 69 U/L (ref 39–117)
Bilirubin, Direct: 0.1 mg/dL (ref 0.0–0.3)
Total Bilirubin: 0.4 mg/dL (ref 0.2–1.2)
Total Protein: 7.1 g/dL (ref 6.0–8.3)

## 2021-12-20 NOTE — Telephone Encounter (Signed)
-----   Message from Chrystie Nose, RN sent at 12/05/2021  8:22 AM EDT ----- Regarding: labs Pt needs lfts, order in epic.

## 2021-12-20 NOTE — Telephone Encounter (Signed)
Received message that pt is needing labs: Chart reviewed.  Pt notified that  Alcide Evener NP  is requesting labs be done: Pt stated that she will try and come by our lab today and have the labs drawn: Pt verbalized understanding with all questions answered.   Pt is questioning the results of her recent CT Scan  Please advise

## 2021-12-23 ENCOUNTER — Other Ambulatory Visit: Payer: Self-pay

## 2021-12-23 DIAGNOSIS — R7989 Other specified abnormal findings of blood chemistry: Secondary | ICD-10-CM

## 2021-12-24 DIAGNOSIS — H401131 Primary open-angle glaucoma, bilateral, mild stage: Secondary | ICD-10-CM | POA: Diagnosis not present

## 2021-12-25 ENCOUNTER — Other Ambulatory Visit: Payer: Self-pay

## 2021-12-25 ENCOUNTER — Telehealth: Payer: Self-pay | Admitting: Internal Medicine

## 2021-12-25 DIAGNOSIS — K529 Noninfective gastroenteritis and colitis, unspecified: Secondary | ICD-10-CM

## 2021-12-25 NOTE — Telephone Encounter (Signed)
PT calls today in regards to recent GI visit. PT was informed that there was mild thickening in the small of her lower intestine, they are afraid that this might develop into fatty liver. They are suggesting PT lose at least 10 lbs of weight to help prevent this and even things out. PT is interested in trying to get on Wegovy, Wal[place], or Ozempic to assist with this weight loss, but PT is wanting to speak with Dr.Crawford prior before choosing this route. She is also open to other suggestions for weight loss as well.   FYI I had let PT know that Dr.Crawford might want to see her in office before going through with any of these options and PT said that would be fine.  CB: [phone]

## 2021-12-26 NOTE — Telephone Encounter (Signed)
FYI

## 2021-12-27 NOTE — Telephone Encounter (Signed)
I am happy to talk with her about weight loss options.

## 2021-12-30 NOTE — Telephone Encounter (Signed)
I am happy to have in person or virtual per her preference.

## 2021-12-30 NOTE — Telephone Encounter (Signed)
Pt scheduled on 01/06/22 with Dr. Sharlet Salina

## 2021-12-30 NOTE — Telephone Encounter (Signed)
Pt agreed to weight loss options and wanted to know if needed appt. To come in the office . Please advise

## 2022-01-02 ENCOUNTER — Other Ambulatory Visit: Payer: Self-pay | Admitting: Cardiology

## 2022-01-02 ENCOUNTER — Other Ambulatory Visit: Payer: Self-pay | Admitting: Internal Medicine

## 2022-01-06 ENCOUNTER — Encounter: Payer: Self-pay | Admitting: Internal Medicine

## 2022-01-06 ENCOUNTER — Ambulatory Visit: Payer: Medicare PPO | Admitting: Internal Medicine

## 2022-01-06 ENCOUNTER — Other Ambulatory Visit (INDEPENDENT_AMBULATORY_CARE_PROVIDER_SITE_OTHER): Payer: Medicare PPO

## 2022-01-06 DIAGNOSIS — Z6833 Body mass index (BMI) 33.0-33.9, adult: Secondary | ICD-10-CM | POA: Diagnosis not present

## 2022-01-06 DIAGNOSIS — E119 Type 2 diabetes mellitus without complications: Secondary | ICD-10-CM | POA: Insufficient documentation

## 2022-01-06 DIAGNOSIS — K529 Noninfective gastroenteritis and colitis, unspecified: Secondary | ICD-10-CM

## 2022-01-06 DIAGNOSIS — E118 Type 2 diabetes mellitus with unspecified complications: Secondary | ICD-10-CM | POA: Insufficient documentation

## 2022-01-06 DIAGNOSIS — E6609 Other obesity due to excess calories: Secondary | ICD-10-CM

## 2022-01-06 DIAGNOSIS — R7301 Impaired fasting glucose: Secondary | ICD-10-CM

## 2022-01-06 LAB — C-REACTIVE PROTEIN: CRP: 1.5 mg/dL (ref 0.5–20.0)

## 2022-01-06 LAB — SEDIMENTATION RATE: Sed Rate: 8 mm/hr (ref 0–30)

## 2022-01-06 MED ORDER — METFORMIN HCL ER 500 MG PO TB24
500.0000 mg | ORAL_TABLET | Freq: Every day | ORAL | 1 refills | Status: DC
Start: 2022-01-06 — End: 2022-07-09

## 2022-01-06 NOTE — Assessment & Plan Note (Signed)
Due for HgA1c with next labs. Previous in pre-diabetes range and counseled today. Rx metformin to help with this as well as with weight loss.

## 2022-01-06 NOTE — Assessment & Plan Note (Signed)
Due to concurrent pre-diabetes will rx metformin 500 mg daily and follow up in a few months to assess benefit.

## 2022-01-06 NOTE — Progress Notes (Signed)
   Subjective:   Patient ID: Angela [name], female    DOB: [birth year], 76 y.o.   MRN: [id number]  HPI The patient is a 76 YO female coming in for weight loss options.   Review of Systems  Constitutional: Negative.   HENT: Negative.    Eyes: Negative.   Respiratory:  Negative for cough, chest tightness and shortness of breath.   Cardiovascular:  Negative for chest pain, palpitations and leg swelling.  Gastrointestinal:  Negative for abdominal distention, abdominal pain, constipation, diarrhea, nausea and vomiting.  Musculoskeletal:  Positive for arthralgias.  Skin: Negative.   Neurological: Negative.   Psychiatric/Behavioral: Negative.      Objective:  Physical Exam Constitutional:      Appearance: She is well-developed.  HENT:     Head: Normocephalic and atraumatic.  Cardiovascular:     Rate and Rhythm: Normal rate and regular rhythm.  Pulmonary:     Effort: Pulmonary effort is normal. No respiratory distress.     Breath sounds: Normal breath sounds. No wheezing or rales.  Abdominal:     General: Bowel sounds are normal. There is no distension.     Palpations: Abdomen is soft.     Tenderness: There is no abdominal tenderness. There is no rebound.  Musculoskeletal:        General: Tenderness present.     Cervical back: Normal range of motion.  Skin:    General: Skin is warm and dry.  Neurological:     Mental Status: She is alert and oriented to person, place, and time.     Coordination: Coordination normal.     Vitals:   01/06/22 1039  BP: 132/76  Pulse: 68  SpO2: 95%  Weight: 182 lb (82.6 kg)  Height: 5\' 2"  (1.575 m)    Assessment & Plan:

## 2022-01-06 NOTE — Patient Instructions (Signed)
Check on wegovy coverage.  We will send in metformin to take 1 pill daily to help.

## 2022-01-14 ENCOUNTER — Telehealth: Payer: Self-pay | Admitting: Nurse Practitioner

## 2022-01-14 NOTE — Telephone Encounter (Signed)
Inbound call from patient stating that she needs to get another stool kit and wants to know when she can come by and get it. Please advise.

## 2022-01-14 NOTE — Telephone Encounter (Signed)
Pt stated that she messed up her original stool kit and needed another one: Pt was notified to come by our lab and obtain another stool specimen kit. Pt verbalized understanding with all questions answered.

## 2022-01-15 ENCOUNTER — Other Ambulatory Visit: Payer: Self-pay | Admitting: Nurse Practitioner

## 2022-01-21 ENCOUNTER — Other Ambulatory Visit: Payer: Medicare PPO

## 2022-01-21 ENCOUNTER — Other Ambulatory Visit (HOSPITAL_BASED_OUTPATIENT_CLINIC_OR_DEPARTMENT_OTHER): Payer: Self-pay

## 2022-01-22 DIAGNOSIS — M79662 Pain in left lower leg: Secondary | ICD-10-CM | POA: Diagnosis not present

## 2022-01-23 ENCOUNTER — Other Ambulatory Visit: Payer: Medicare PPO

## 2022-01-23 DIAGNOSIS — K529 Noninfective gastroenteritis and colitis, unspecified: Secondary | ICD-10-CM

## 2022-01-26 ENCOUNTER — Other Ambulatory Visit: Payer: Self-pay | Admitting: Cardiology

## 2022-01-27 ENCOUNTER — Ambulatory Visit (INDEPENDENT_AMBULATORY_CARE_PROVIDER_SITE_OTHER): Payer: Medicare PPO

## 2022-01-27 VITALS — Ht 62.0 in | Wt 179.0 lb

## 2022-01-27 DIAGNOSIS — Z0000 Encounter for general adult medical examination without abnormal findings: Secondary | ICD-10-CM | POA: Diagnosis not present

## 2022-01-27 NOTE — Progress Notes (Addendum)
Virtual Visit via Telephone Note  I connected with  Angela [name] on 01/27/22 at 11:30 AM EDT by telephone and verified that I am speaking with the correct person using two identifiers.  Location: Patient: Home Provider: [place] Persons participating in the virtual visit: [place]   I discussed the limitations, risks, security and privacy concerns of performing an evaluation and management service by telephone and the availability of in person appointments. The patient expressed understanding and agreed to proceed.  Interactive audio and video telecommunications were attempted between this nurse and patient, however failed, due to patient having technical difficulties OR patient did not have access to video capability.  We continued and completed visit with audio only.  Some vital signs may be absent or patient reported.   Sheral Flow, LPN  Subjective:   Angela [name] is a 76 y.o. female who presents for Medicare Annual (Subsequent) preventive examination.  Review of Systems     Cardiac Risk Factors include: advanced age (>23men, >39 women);dyslipidemia;family history of premature cardiovascular disease;hypertension     Objective:    Today's Vitals   01/27/22 1132  Weight: 179 lb (81.2 kg)  Height: 5\' 2"  (1.575 m)  PainSc: 5    Body mass index is 32.74 kg/m.     01/27/2022   11:35 AM 08/01/2020   11:38 AM 08/01/2019   11:49 AM 07/30/2018   10:39 AM 07/23/2017   11:42 AM 11/10/2016    1:04 AM 11/22/2015   11:08 AM  Advanced Directives  Does Patient Have a Medical Advance Directive? No No No No No No No  Would patient like information on creating a medical advance directive? No - Patient declined No - Patient declined Yes (ED - Information included in AVS) No - Patient declined No - Patient declined Yes (Inpatient - patient requests chaplain consult to create a medical advance directive) Yes - Educational materials given    Current  Medications (verified) Outpatient Encounter Medications as of 01/27/2022  Medication Sig   albuterol (VENTOLIN HFA) 108 (90 Base) MCG/ACT inhaler Inhale 2 puffs into the lungs every 6 (six) hours as needed for wheezing or shortness of breath.   aspirin 81 MG tablet Take 81 mg by mouth daily.   cholestyramine (QUESTRAN) 4 g packet TAKE 1 PACKET BY MOUTH DAILY.   desonide (DESOWEN) 0.05 % cream Apply topically 2 (two) times daily.   esomeprazole (NEXIUM) 40 MG capsule TAKE 1 CAPSULE BY MOUTH 2 TIMES DAILY BEFORE A MEAL.   famotidine (PEPCID) 20 MG tablet TAKE 1 TABLET BY MOUTH EVERYDAY AT BEDTIME   fluocinonide gel (LIDEX) 0.05 % fluocinonide 0.05 % topical gel  APPLY 4-5 APPLICATIONS DAILY AS NEEDED   ketoconazole (NIZORAL) 2 % cream Apply 1 application topically daily as needed for irritation.    latanoprost (XALATAN) 0.005 % ophthalmic solution latanoprost 0.005 % eye drops  PLACE 1 DROP IN BOTH EYES AT BEDTIME   levothyroxine (SYNTHROID) 50 MCG tablet TAKE 0.5 TABLETS (25 MCG TOTAL) BY MOUTH DAILY.   loratadine (CLARITIN) 10 MG tablet TAKE 1 TABLET EVERY DAY AS NEEDED FOR ALLEGIES   meloxicam (MOBIC) 15 MG tablet Take 15 mg by mouth daily.   metFORMIN (GLUCOPHAGE-XR) 500 MG 24 hr tablet Take 1 tablet (500 mg total) by mouth daily with breakfast.   metoprolol tartrate (LOPRESSOR) 50 MG tablet TAKE 1 TABLET BY MOUTH TWICE A DAY   metroNIDAZOLE (METROCREAM) 0.75 % cream metronidazole 0.75 % topical cream   mometasone (NASONEX)  50 MCG/ACT nasal spray Place 2 sprays into the nose daily.   PARoxetine (PAXIL) 10 MG tablet Paxil 10 mg tablet  Take 1 tablet every day by oral route.   traMADol (ULTRAM) 50 MG tablet Take 50 mg by mouth as needed.   triamcinolone cream (KENALOG) 0.1 % Apply 1 application topically 2 (two) times daily as needed (for breakout).    No facility-administered encounter medications on file as of 01/27/2022.    Allergies (verified) Shellfish allergy, Tetracycline,  Effexor xr [venlafaxine hcl], Codeine, Iohexol, and Paroxetine   History: 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   Fibromyalgia    Gastroparesis    GERD (gastroesophageal reflux disease) 09/13/2007   Glaucoma    Hiatal hernia 04/25/2005   Hyperlipidemia    Hypertension    Hypothyroidism    IBS (irritable bowel syndrome)    Migraine    Osteoarthritis    Other and unspecified noninfectious gastroenteritis and colitis(558.9)    Sessile colonic polyp 05/30/2009   Vertigo    Vitamin D deficiency    Past Surgical History:  Procedure Laterality Date   BUNIONECTOMY  09/04/2011   left   CESAREAN SECTION     CHOLECYSTECTOMY  1985   COLONOSCOPY     FOOT OSTEOTOMY  2006   Right foot   ROTATOR CUFF REPAIR Right 04/19/2020   TUBAL LIGATION  1983   VAGINAL HYSTERECTOMY  1993   Family History  Problem Relation Age of Onset   Mitral valve prolapse Mother    Aneurysm Mother        cerebral   Hypertension Father    Colon polyps Father    Stroke Father    Hypertension Sister        x2   Diabetes Sister    Colon cancer Maternal Aunt    Stomach cancer Maternal Aunt    Colon polyps Sister    Pancreatic cancer Neg Hx    Migraines Neg Hx    Social History   Socioeconomic History   Marital status: Married    Spouse name: Not on file   Number of children: 2   Years of education: Not on file   Highest education level: Not on file  Occupational History   Occupation: Retired  Tobacco Use   Smoking status: Never   Smokeless tobacco: Never  Vaping Use   Vaping Use: Never used  Substance and Sexual Activity   Alcohol use: No   Drug use: No   Sexual  activity: Yes  Other Topics Concern   Not on file  Social History Narrative   Lives at home with husband   Right handed   Caffeine: 2 cups/day, sweet tea   Social Determinants of Health   Financial Resource Strain: Low Risk  (01/27/2022)   Overall Financial Resource Strain (CARDIA)    Difficulty of Paying Living Expenses: Not hard at all  Food Insecurity: No Food Insecurity (01/27/2022)   Hunger Vital Sign    Worried About Estate manager/land agent of Food in the Last Year: Never true    Ran Out of Food in the Last Year: Never true  Transportation Needs: No Transportation Needs (01/27/2022)   PRAPARE - Hydrologist (Medical): No    Lack of Transportation (Non-Medical): No  Physical Activity: Insufficiently Active (01/27/2022)   Exercise Vital Sign    Days of Exercise per Week: 7 days    Minutes of Exercise per Session: 20 min  Stress: No Stress Concern Present (01/27/2022)   [place]    Feeling of Stress : Not at all  Social Connections: [place] (01/27/2022)   Social Connection and Isolation Panel [NHANES]    Frequency of Communication with Friends and Family: More than three times a week    Frequency of Social Gatherings with Friends and Family: More than three times a week    Attends Religious Services: More than 4 times per year    Active Member of [employer] or Organizations: Yes    Attends Music therapist: More than 4 times per year    Marital Status: Married    Tobacco Counseling Counseling given: Not Answered   Clinical Intake:  Pre-visit preparation completed: Yes  Pain : 0-10 Pain Score: 5  Pain Type: Chronic pain Pain Location: Generalized (Left Hip Sciatica)     BMI - recorded: 32.74 Nutritional Status: BMI > 30  Obese Nutritional Risks: None Diabetes: No  How often do you need to have someone help you when you read instructions, pamphlets, or  other written materials from your doctor or pharmacy?: 1 - Never What is the last grade level you completed in school?: HS[employer]; Master's Degree  Diabetic? prediabetes  Interpreter Needed?: No  Information entered by :: Lisette Abu, LPN.   Activities of Daily Living    01/27/2022   11:45 AM  In your present state of health, do you have any difficulty performing the following activities:  Hearing? 0  Vision? 0  Difficulty concentrating or making decisions? 0  Walking or climbing stairs? 0  Dressing or bathing? 0  Doing errands, shopping? 0  Preparing Food and eating ? N  Using the Toilet? N  In the past six months, have you accidently leaked urine? N  Do you have problems with loss of bowel control? N  Managing your Medications? N  Managing your Finances? N  Housekeeping or managing your Housekeeping? N    Patient Care Team: Hoyt Koch, MD as PCP - General (Internal Medicine) Stanford Breed Denice Bors, MD as PCP - Cardiology (Cardiology) Stanford Breed Denice Bors, MD as Consulting Physician (Cardiology) Milus Banister, MD as Attending Physician (Gastroenterology) Melrose Nakayama, MD as Consulting Physician (Orthopedic Surgery) Marygrace Drought, MD as Consulting Physician (Ophthalmology) Sanjuana Mae, MD as Referring Physician (Vascular Surgery) Olga Millers, MD as Consulting Physician (Obstetrics and Gynecology)  Indicate any recent Medical Services you may have received from other than [facility] providers in the past year (date may be approximate).     Assessment:   This is a routine wellness examination for Khalil.  Hearing/Vision screen Hearing Screening - Comments:: Denies hearing difficulties   Vision Screening - Comments:: Wears rx glasses - up to date with routine eye exams with Marygrace Drought, MD.   Dietary issues and exercise activities discussed: Current Exercise Habits: Home exercise routine, Time (Minutes): 20, Frequency (Times/Week): 7, Weekly  Exercise (Minutes/Week): 140, Intensity: Moderate, Exercise limited by: orthopedic condition(s);Other - see comments (fibromyalgia)   Goals Addressed             This Visit's Progress  My goal is to get back into [employer] Program this winter, continue to watch my diet and stay independent.        Depression Screen    01/27/2022   11:40 AM 11/04/2021    2:33 PM 06/10/2021    8:34 AM 08/01/2020   10:51 AM 08/01/2019   11:50 AM 07/30/2018   10:39 AM 07/23/2017   11:43 AM  PHQ 2/9 Scores  PHQ - 2 Score 0 0 0 0 0 0 0  PHQ- 9 Score       2    Fall Risk    01/27/2022   11:38 AM 11/04/2021    2:33 PM 06/10/2021    8:34 AM 08/01/2020   10:52 AM 08/01/2019   11:50 AM  Fall Risk   Falls in the past year? 0 1 0 1 0  Number falls in past yr: 0 1 0 1 0  Injury with Fall? 0 1 0 1 0  Risk for fall due to : No Fall Risks History of fall(s)   No Fall Risks  Follow up Falls prevention discussed Falls evaluation completed   Falls evaluation completed;Education provided;Falls prevention discussed    FALL RISK PREVENTION PERTAINING TO THE HOME:  Any stairs in or around the home? Yes  If so, are there any without handrails? No  Home free of loose throw rugs in walkways, pet beds, electrical cords, etc? Yes  Adequate lighting in your home to reduce risk of falls? Yes   ASSISTIVE DEVICES UTILIZED TO PREVENT FALLS:  Life alert? No  Use of a cane, walker or w/c? No  Grab bars in the bathroom? Yes  Shower chair or bench in shower? No  Elevated toilet seat or a handicapped toilet? No   TIMED UP AND GO:  Was the test performed? No . Phone Visit  Cognitive Function:        01/27/2022   11:54 AM 08/01/2019   11:51 AM  6CIT Screen  What Year? 0 points 0 points  What month? 0 points 0 points  What time? 0 points 0 points  Count back from 20 0 points 0 points  Months in reverse 0 points 0 points  Repeat phrase 0 points 0 points  Total Score 0 points 0 points     Immunizations Immunization History  Administered Date(s) Administered   Fluad Quad(high Dose 65+) 12/30/2021   Influenza Split 02/18/2011, 02/16/2012   Influenza Whole 02/12/2009, 05/21/2010   Influenza, High Dose Seasonal PF 04/04/2015, 02/07/2016, 12/19/2016, 02/07/2021   Influenza, Seasonal, Injecte, Preservative Fre 03/29/2014   Influenza,inj,Quad PF,6+ Mos 01/27/2013   Influenza-Unspecified 07/14/2011, 01/12/2014, 01/12/2018, 12/09/2018   PFIZER Comirnaty(Gray Top)Covid-19 Tri-Sucrose Vaccine 07/27/2020, 12/30/2021   PFIZER(Purple Top)SARS-COV-2 Vaccination 05/04/2019, 05/23/2019, 12/20/2019   Pneumococcal Conjugate-13 05/31/2015   Pneumococcal Polysaccharide-23 03/29/2014   Tdap 09/14/2012    TDAP status: Up to date  Flu Vaccine status: Up to date  Pneumococcal vaccine status: Up to date  Covid-19 vaccine status: Completed vaccines  Qualifies for Shingles Vaccine? Yes   Zostavax completed No   Shingrix Completed?: No.    Education has been provided regarding the importance of this vaccine. Patient has been advised to call insurance company to determine out of pocket expense if they have not yet received this vaccine. Advised may also receive vaccine at local pharmacy or Health Dept. Verbalized acceptance and understanding.  Screening Tests Health Maintenance  Topic Date Due   Hepatitis C Screening  Never done   Zoster Vaccines- Shingrix (  1 of 2) Never done   COVID-19 Vaccine (6 - Pfizer risk series) 02/24/2022   TETANUS/TDAP  09/15/2022   COLONOSCOPY (Pts 45-65yrs Insurance coverage will need to be confirmed)  02/14/2030   Pneumonia Vaccine 64+ Years old  Completed   INFLUENZA VACCINE  Completed   DEXA SCAN  Completed   HPV VACCINES  Aged Out    Health Maintenance  Health Maintenance Due  Topic Date Due   Hepatitis C Screening  Never done   Zoster Vaccines- Shingrix (1 of 2) Never done    Colorectal cancer screening: Type of screening: Colonoscopy.  Completed 02/15/2020. Repeat every 10 years  Mammogram status: Completed 09/2021. Repeat every year  Bone Density status: no record since 2004  Lung Cancer Screening: (Low Dose CT Chest recommended if Age 61-80 years, 30 pack-year currently smoking OR have quit w/in 15years.) does not qualify.   Lung Cancer Screening Referral: no  Additional Screening:  Hepatitis C Screening: does qualify; Completed no  Vision Screening: Recommended annual ophthalmology exams for early detection of glaucoma and other disorders of the eye. Is the patient up to date with their annual eye exam?  Yes  Who is the provider or what is the name of the office in which the patient attends annual eye exams? Marygrace Drought, MD. If pt is not established with a provider, would they like to be referred to a provider to establish care? No .   Dental Screening: Recommended annual dental exams for proper oral hygiene  Community Resource Referral / Chronic Care Management: CRR required this visit?  No   CCM required this visit?  No      Plan:     I have personally reviewed and noted the following in the patient's chart:   Medical and social history Use of alcohol, tobacco or illicit drugs  Current medications and supplements including opioid prescriptions. Patient is not currently taking opioid prescriptions. Functional ability and status Nutritional status Physical activity Advanced directives List of other physicians Hospitalizations, surgeries, and ER visits in previous 12 months Vitals Screenings to include cognitive, depression, and falls Referrals and appointments  In addition, I have reviewed and discussed with patient certain preventive protocols, quality metrics, and best practice recommendations. A written personalized care plan for preventive services as well as general preventive health recommendations were provided to patient.     Sheral Flow, LPN   25/08/3974   Nurse Notes:  N/A   Medical screening examination/treatment/procedure(s) were performed by non-physician practitioner and as supervising physician I was immediately available for consultation/collaboration.  I agree with above. Lew Dawes, MD

## 2022-01-27 NOTE — Patient Instructions (Signed)
[name] , Thank you for taking time to come for your Medicare Wellness Visit. I appreciate your ongoing commitment to your health goals. Please review the following plan we discussed and let me know if I can assist you in the future.   These are the goals we discussed:  Goals      My goal is to get back into [employer] [facility] this winter, continue to watch my diet and stay independent.        This is a list of the screening recommended for you and due dates:  Health Maintenance  Topic Date Due   Hepatitis C Screening: USPSTF Recommendation to screen - Ages 18-79 yo.  Never done   Zoster (Shingles) Vaccine (1 of 2) Never done   COVID-19 Vaccine (6 - Pfizer risk series) 02/24/2022   Tetanus Vaccine  09/15/2022   Colon Cancer Screening  02/14/2030   Pneumonia Vaccine  Completed   Flu Shot  Completed   DEXA scan (bone density measurement)  Completed   HPV Vaccine  Aged Out    Advanced directives: No  Conditions/risks identified: Yes; Get back into [employer] [facility] this winter.  Next appointment: Follow up in one year for your annual wellness visit on 01/29/2023 at 11:15 a.m. telephone visit with Angela [name], Nurse Health Advisor.  If you need to reschedule or cancel, please call [phone].   Preventive Care 15 Years and Older, Female Preventive care refers to lifestyle choices and visits with your health care provider that can promote health and wellness. What does preventive care include? A yearly physical exam. This is also called an annual well check. Dental exams once or twice a year. Routine eye exams. Ask your health care provider how often you should have your eyes checked. Personal lifestyle choices, including: Daily care of your teeth and gums. Regular physical activity. Eating a healthy diet. Avoiding tobacco and drug use. Limiting alcohol use. Practicing safe sex. Taking low-dose aspirin every day. Taking vitamin and mineral supplements as recommended  by your health care provider. What happens during an annual well check? The services and screenings done by your health care provider during your annual well check will depend on your age, overall health, lifestyle risk factors, and family history of disease. Counseling  Your health care provider may ask you questions about your: Alcohol use. Tobacco use. Drug use. Emotional well-being. Home and relationship well-being. Sexual activity. Eating habits. History of falls. Memory and ability to understand (cognition). Work and work Statistician. Reproductive health. Screening  You may have the following tests or measurements: Height, weight, and BMI. Blood pressure. Lipid and cholesterol levels. These may be checked every 5 years, or more frequently if you are over 22 years old. Skin check. Lung cancer screening. You may have this screening every year starting at age 57 if you have a 30-pack-year history of smoking and currently smoke or have quit within the past 15 years. Fecal occult blood test (FOBT) of the stool. You may have this test every year starting at age 48. Flexible sigmoidoscopy or colonoscopy. You may have a sigmoidoscopy every 5 years or a colonoscopy every 10 years starting at age 4. Hepatitis C blood test. Hepatitis B blood test. Sexually transmitted disease (STD) testing. Diabetes screening. This is done by checking your blood sugar (glucose) after you have not eaten for a while (fasting). You may have this done every 1-3 years. Bone density scan. This is done to screen for osteoporosis. You may have this done  starting at age 9. Mammogram. This may be done every 1-2 years. Talk to your health care provider about how often you should have regular mammograms. Talk with your health care provider about your test results, treatment options, and if necessary, the need for more tests. Vaccines  Your health care provider may recommend certain vaccines, such as: Influenza  vaccine. This is recommended every year. Tetanus, diphtheria, and acellular pertussis (Tdap, Td) vaccine. You may need a Td booster every 10 years. Zoster vaccine. You may need this after age 51. Pneumococcal 13-valent conjugate (PCV13) vaccine. One dose is recommended after age 62. Pneumococcal polysaccharide (PPSV23) vaccine. One dose is recommended after age 80. Talk to your health care provider about which screenings and vaccines you need and how often you need them. This information is not intended to replace advice given to you by your health care provider. Make sure you discuss any questions you have with your health care provider. Document Released: 04/27/2015 Document Revised: 12/19/2015 Document Reviewed: 01/30/2015 Elsevier Interactive Patient Education  2017 [place] Prevention in the Home Falls can cause injuries. They can happen to people of all ages. There are many things you can do to make your home safe and to help prevent falls. What can I do on the outside of my home? Regularly fix the edges of walkways and driveways and fix any cracks. Remove anything that might make you trip as you walk through a door, such as a raised step or threshold. Trim any bushes or trees on the path to your home. Use bright outdoor lighting. Clear any walking paths of anything that might make someone trip, such as rocks or tools. Regularly check to see if handrails are loose or broken. Make sure that both sides of any steps have handrails. Any raised decks and porches should have guardrails on the edges. Have any leaves, snow, or ice cleared regularly. Use sand or salt on walking paths during winter. Clean up any spills in your garage right away. This includes oil or grease spills. What can I do in the bathroom? Use night lights. Install grab bars by the toilet and in the tub and shower. Do not use towel bars as grab bars. Use non-skid mats or decals in the tub or shower. If you  need to sit down in the shower, use a plastic, non-slip stool. Keep the floor dry. Clean up any water that spills on the floor as soon as it happens. Remove soap buildup in the tub or shower regularly. Attach bath mats securely with double-sided non-slip rug tape. Do not have throw rugs and other things on the floor that can make you trip. What can I do in the bedroom? Use night lights. Make sure that you have a light by your bed that is easy to reach. Do not use any sheets or blankets that are too big for your bed. They should not hang down onto the floor. Have a firm chair that has side arms. You can use this for support while you get dressed. Do not have throw rugs and other things on the floor that can make you trip. What can I do in the kitchen? Clean up any spills right away. Avoid walking on wet floors. Keep items that you use a lot in easy-to-reach places. If you need to reach something above you, use a strong step stool that has a grab bar. Keep electrical cords out of the way. Do not use floor polish or wax that  makes floors slippery. If you must use wax, use non-skid floor wax. Do not have throw rugs and other things on the floor that can make you trip. What can I do with my stairs? Do not leave any items on the stairs. Make sure that there are handrails on both sides of the stairs and use them. Fix handrails that are broken or loose. Make sure that handrails are as long as the stairways. Check any carpeting to make sure that it is firmly attached to the stairs. Fix any carpet that is loose or worn. Avoid having throw rugs at the top or bottom of the stairs. If you do have throw rugs, attach them to the floor with carpet tape. Make sure that you have a light switch at the top of the stairs and the bottom of the stairs. If you do not have them, ask someone to add them for you. What else can I do to help prevent falls? Wear shoes that: Do not have high heels. Have rubber  bottoms. Are comfortable and fit you well. Are closed at the toe. Do not wear sandals. If you use a stepladder: Make sure that it is fully opened. Do not climb a closed stepladder. Make sure that both sides of the stepladder are locked into place. Ask someone to hold it for you, if possible. Clearly mark and make sure that you can see: Any grab bars or handrails. First and last steps. Where the edge of each step is. Use tools that help you move around (mobility aids) if they are needed. These include: Canes. Walkers. Scooters. Crutches. Turn on the lights when you go into a dark area. Replace any light bulbs as soon as they burn out. Set up your furniture so you have a clear path. Avoid moving your furniture around. If any of your floors are uneven, fix them. If there are any pets around you, be aware of where they are. Review your medicines with your doctor. Some medicines can make you feel dizzy. This can increase your chance of falling. Ask your doctor what other things that you can do to help prevent falls. This information is not intended to replace advice given to you by your health care provider. Make sure you discuss any questions you have with your health care provider. Document Released: 01/25/2009 Document Revised: 09/06/2015 Document Reviewed: 05/05/2014 Elsevier Interactive Patient Education  2017 [employer].

## 2022-01-29 ENCOUNTER — Other Ambulatory Visit: Payer: Self-pay

## 2022-01-29 DIAGNOSIS — R062 Wheezing: Secondary | ICD-10-CM

## 2022-01-29 LAB — CALPROTECTIN, FECAL: Calprotectin, Fecal: 154 ug/g — ABNORMAL HIGH (ref 0–120)

## 2022-01-29 MED ORDER — ALBUTEROL SULFATE HFA 108 (90 BASE) MCG/ACT IN AERS: 2.0000 | INHALATION_SPRAY | Freq: Four times a day (QID) | RESPIRATORY_TRACT | 0 refills | Status: DC | PRN

## 2022-01-30 ENCOUNTER — Other Ambulatory Visit: Payer: Self-pay

## 2022-01-30 DIAGNOSIS — R109 Unspecified abdominal pain: Secondary | ICD-10-CM

## 2022-01-30 DIAGNOSIS — K529 Noninfective gastroenteritis and colitis, unspecified: Secondary | ICD-10-CM

## 2022-02-07 ENCOUNTER — Encounter (HOSPITAL_COMMUNITY): Payer: Self-pay

## 2022-02-07 ENCOUNTER — Other Ambulatory Visit (HOSPITAL_COMMUNITY): Payer: Self-pay | Admitting: Physician Assistant

## 2022-02-07 ENCOUNTER — Ambulatory Visit (HOSPITAL_COMMUNITY)
Admission: RE | Admit: 2022-02-07 | Discharge: 2022-02-07 | Disposition: A | Discharge status: Home / Self Care | Payer: Medicare PPO | Source: Ambulatory Visit | Attending: Gastroenterology | Admitting: Gastroenterology

## 2022-02-07 DIAGNOSIS — K529 Noninfective gastroenteritis and colitis, unspecified: Secondary | ICD-10-CM | POA: Insufficient documentation

## 2022-02-07 DIAGNOSIS — R109 Unspecified abdominal pain: Secondary | ICD-10-CM

## 2022-02-07 MED ORDER — PREDNISONE 50 MG PO TABS: ORAL_TABLET | ORAL | 0 refills | Status: DC

## 2022-02-10 ENCOUNTER — Other Ambulatory Visit (HOSPITAL_COMMUNITY): Payer: Medicare PPO

## 2022-02-10 ENCOUNTER — Ambulatory Visit (HOSPITAL_COMMUNITY)
Admission: RE | Admit: 2022-02-10 | Discharge: 2022-02-10 | Disposition: A | Discharge status: Home / Self Care | Payer: Medicare PPO | Source: Ambulatory Visit | Attending: Gastroenterology | Admitting: Gastroenterology

## 2022-02-10 DIAGNOSIS — K760 Fatty (change of) liver, not elsewhere classified: Secondary | ICD-10-CM | POA: Diagnosis not present

## 2022-02-10 DIAGNOSIS — R109 Unspecified abdominal pain: Secondary | ICD-10-CM | POA: Diagnosis not present

## 2022-02-10 DIAGNOSIS — K529 Noninfective gastroenteritis and colitis, unspecified: Secondary | ICD-10-CM | POA: Diagnosis not present

## 2022-02-10 DIAGNOSIS — I700 Atherosclerosis of aorta: Secondary | ICD-10-CM | POA: Diagnosis not present

## 2022-02-10 LAB — POCT I-STAT CREATININE: Creatinine, Ser: 0.9 mg/dL (ref 0.44–1.00)

## 2022-02-10 MED ORDER — BARIUM SULFATE 0.1 % PO SUSP
ORAL | Status: AC
  Filled 2022-02-10: qty 3

## 2022-02-10 MED ORDER — IOHEXOL 300 MG/ML  SOLN
100.0000 mL | Freq: Once | INTRAMUSCULAR | Status: AC | PRN
  Administered 2022-02-10: 100 mL via INTRAVENOUS

## 2022-02-11 ENCOUNTER — Encounter (HOSPITAL_BASED_OUTPATIENT_CLINIC_OR_DEPARTMENT_OTHER): Payer: Self-pay | Admitting: Emergency Medicine

## 2022-02-11 ENCOUNTER — Emergency Department (HOSPITAL_BASED_OUTPATIENT_CLINIC_OR_DEPARTMENT_OTHER)
Admission: EM | Admit: 2022-02-11 | Discharge: 2022-02-11 | Disposition: A | Discharge status: Home / Self Care | Payer: Medicare PPO | Attending: Emergency Medicine | Admitting: Emergency Medicine

## 2022-02-11 ENCOUNTER — Other Ambulatory Visit: Payer: Self-pay

## 2022-02-11 ENCOUNTER — Emergency Department (HOSPITAL_BASED_OUTPATIENT_CLINIC_OR_DEPARTMENT_OTHER): Payer: Medicare PPO

## 2022-02-11 DIAGNOSIS — S60142A Contusion of left ring finger with damage to nail, initial encounter: Secondary | ICD-10-CM | POA: Diagnosis not present

## 2022-02-11 DIAGNOSIS — S60152A Contusion of left little finger with damage to nail, initial encounter: Secondary | ICD-10-CM | POA: Insufficient documentation

## 2022-02-11 DIAGNOSIS — W231XXA Caught, crushed, jammed, or pinched between stationary objects, initial encounter: Secondary | ICD-10-CM | POA: Diagnosis not present

## 2022-02-11 DIAGNOSIS — S6991XA Unspecified injury of right wrist, hand and finger(s), initial encounter: Secondary | ICD-10-CM | POA: Diagnosis not present

## 2022-02-11 DIAGNOSIS — S6010XA Contusion of unspecified finger with damage to nail, initial encounter: Secondary | ICD-10-CM

## 2022-02-11 DIAGNOSIS — S60141A Contusion of right ring finger with damage to nail, initial encounter: Secondary | ICD-10-CM | POA: Diagnosis not present

## 2022-02-11 DIAGNOSIS — Z7982 Long term (current) use of aspirin: Secondary | ICD-10-CM | POA: Diagnosis not present

## 2022-02-11 DIAGNOSIS — S60131A Contusion of right middle finger with damage to nail, initial encounter: Secondary | ICD-10-CM | POA: Insufficient documentation

## 2022-02-11 DIAGNOSIS — S6000XA Contusion of unspecified finger without damage to nail, initial encounter: Secondary | ICD-10-CM

## 2022-02-11 DIAGNOSIS — S60121A Contusion of right index finger with damage to nail, initial encounter: Secondary | ICD-10-CM | POA: Diagnosis not present

## 2022-02-11 DIAGNOSIS — S6992XA Unspecified injury of left wrist, hand and finger(s), initial encounter: Secondary | ICD-10-CM | POA: Diagnosis not present

## 2022-02-11 DIAGNOSIS — S60132A Contusion of left middle finger with damage to nail, initial encounter: Secondary | ICD-10-CM | POA: Insufficient documentation

## 2022-02-11 MED ORDER — ACETAMINOPHEN 500 MG PO TABS
1000.0000 mg | ORAL_TABLET | Freq: Once | ORAL | Status: AC
  Administered 2022-02-11: 1000 mg via ORAL
  Filled 2022-02-11: qty 2

## 2022-02-11 NOTE — ED Notes (Signed)
Discharge instructions reviewed with patient. Patient verbalizes understanding, no further questions at this time. Medications and follow up information provided. No acute distress noted at time of departure.  

## 2022-02-11 NOTE — Discharge Instructions (Signed)
Please take Tylenol 1000 mg every 6 hours for pain

## 2022-02-11 NOTE — ED Triage Notes (Signed)
Bilateral hand injury , garage door fell and closed on bilateral hands/ all fingers tips  , obvious distress. No visible deformity .

## 2022-02-11 NOTE — ED Provider Notes (Signed)
[facility] EMERGENCY DEPARTMENT Provider Note   CSN: [id number] Arrival date & time: 02/11/22  1245     History  Chief Complaint  Patient presents with   Hand Injury    Bilateral    NAIOMY WATTERS is a 76 y.o. female.   Hand Injury  Patient is a 76 year old female presented emergency room today after she crushed all of her fingertips in the process of closing a garage door she states that her fingertips were squeezed between 2 pieces of metal.  She states she has some bruising under her fingernails.  She states her right fourth finger is most uncomfortable.  She denies any other injuries as a result of this incident.  She has not had any bleeding.  No fall afterwards.      Home Medications Prior to Admission medications   Medication Sig Start Date End Date Taking? Authorizing Provider  albuterol (VENTOLIN HFA) 108 (90 Base) MCG/ACT inhaler Inhale 2 puffs into the lungs every 6 (six) hours as needed for wheezing or shortness of breath. 01/29/22   Myrlene Broker, MD  aspirin 81 MG tablet Take 81 mg by mouth daily.    [provider]  cholestyramine (QUESTRAN) 4 g packet TAKE 1 PACKET BY MOUTH DAILY. 06/04/21   Rachael Fee, MD  desonide (DESOWEN) 0.05 % cream Apply topically 2 (two) times daily. 12/22/16   Myra Rude, MD  esomeprazole (NEXIUM) 40 MG capsule TAKE 1 CAPSULE BY MOUTH 2 TIMES DAILY BEFORE A MEAL. 10/24/21   Rachael Fee, MD  famotidine (PEPCID) 20 MG tablet TAKE 1 TABLET BY MOUTH EVERYDAY AT BEDTIME 01/15/22   Mansouraty, Netty Starring., MD  fluocinonide gel (LIDEX) 0.05 % fluocinonide 0.05 % topical gel  APPLY 4-5 APPLICATIONS DAILY AS NEEDED    [provider]  ketoconazole (NIZORAL) 2 % cream Apply 1 application topically daily as needed for irritation.     [provider]  latanoprost (XALATAN) 0.005 % ophthalmic solution latanoprost 0.005 % eye drops  PLACE 1 DROP IN BOTH EYES AT BEDTIME    [provider]  levothyroxine (SYNTHROID) 50 MCG tablet TAKE 0.5 TABLETS (25 MCG TOTAL) BY MOUTH DAILY. 01/15/22   Myrlene Broker, MD  loratadine (CLARITIN) 10 MG tablet TAKE 1 TABLET EVERY DAY AS NEEDED FOR ALLEGIES 04/18/20   Olive Bass, FNP  meloxicam (MOBIC) 15 MG tablet Take 15 mg by mouth daily.    [provider]  metFORMIN (GLUCOPHAGE-XR) 500 MG 24 hr tablet Take 1 tablet (500 mg total) by mouth daily with breakfast. 01/06/22   Myrlene Broker, MD  metoprolol tartrate (LOPRESSOR) 50 MG tablet TAKE 1 TABLET BY MOUTH TWICE A DAY 01/27/22   Lewayne Bunting, MD  metroNIDAZOLE (METROCREAM) 0.75 % cream metronidazole 0.75 % topical cream    [provider]  mometasone (NASONEX) 50 MCG/ACT nasal spray Place 2 sprays into the nose daily.    [provider]  PARoxetine (PAXIL) 10 MG tablet Paxil 10 mg tablet  Take 1 tablet every day by oral route.    [provider]  predniSONE (DELTASONE) 50 MG tablet Start taking evening prior to imaging.  Take 1 tablet at 10pm, 1 tablet at 4am, and again 1 tablet at 10am.  With 10am dosing, also take 50mg  Benadryl.  Do have a driver to and from appointment. 02/07/22   Boisseau, Hayley, PA  traMADol (ULTRAM) 50 MG tablet Take 50 mg by mouth as needed. 07/16/20  [provider]  triamcinolone cream (KENALOG) 0.1 % Apply 1 application topically 2 (two) times daily as needed (for breakout).     [provider]      Allergies    Shellfish allergy, Tetracycline, Effexor xr [venlafaxine hcl], Codeine, Iohexol, and Paroxetine    Review of Systems   Review of Systems  Physical Exam Updated Vital Signs BP 136/65 (BP Location: Left Arm)   Pulse 67   Temp 98.3 F (36.8 C) (Oral)   Resp 18   Ht 5\' 1"  (1.549 m)   Wt 81.2 kg   SpO2 95%   BMI 33.82 kg/m  Physical Exam Vitals and nursing note reviewed.  Constitutional:      General: She is not in acute distress.    Appearance: Normal  appearance. She is not ill-appearing.  HENT:     Head: Normocephalic and atraumatic.  Eyes:     General: No scleral icterus.       Right eye: No discharge.        Left eye: No discharge.     Conjunctiva/sclera: Conjunctivae normal.  Pulmonary:     Effort: Pulmonary effort is normal.     Breath sounds: No stridor.  Skin:    General: Skin is warm and dry.     Comments: Small approximately 20% subungual hematoma of left fingers 3-5 and RIGHT fingers 2, 3. >50% goal hematoma of right fourth finger  Neurological:     Mental Status: She is alert and oriented to person, place, and time. Mental status is at baseline.     ED Results / Procedures / Treatments   Labs (all labs ordered are listed, but only abnormal results are displayed) Labs Reviewed - No data to display  EKG None  Radiology DG Hand Complete Left  Result Date: 02/11/2022 CLINICAL DATA:  Tried to pull down a garage door fingers caught in EXAM: LEFT HAND - COMPLETE 3+ VIEW; RIGHT HAND - COMPLETE 3+ VIEW COMPARISON:  None Available. FINDINGS: Right: There is no acute fracture or dislocation. Bony alignment is normal. The joint spaces are preserved. There is no erosive change. There is no soft tissue gas or radiopaque foreign body. Left: There is no acute fracture or dislocation. Bony alignment is normal. The joint spaces are preserved. There is no erosive change. There is no radiopaque foreign body or soft tissue gas. IMPRESSION: No evidence of acute injury in either hand. Electronically Signed   By: 02/13/2022 M.D.   On: 02/11/2022 13:28   DG Hand Complete Right  Result Date: 02/11/2022 CLINICAL DATA:  Tried to pull down a garage door fingers caught in EXAM: LEFT HAND - COMPLETE 3+ VIEW; RIGHT HAND - COMPLETE 3+ VIEW COMPARISON:  None Available. FINDINGS: Right: There is no acute fracture or dislocation. Bony alignment is normal. The joint spaces are preserved. There is no erosive change. There is no soft tissue gas or  radiopaque foreign body. Left: There is no acute fracture or dislocation. Bony alignment is normal. The joint spaces are preserved. There is no erosive change. There is no radiopaque foreign body or soft tissue gas. IMPRESSION: No evidence of acute injury in either hand. Electronically Signed   By: 02/13/2022 M.D.   On: 02/11/2022 13:28    Procedures Procedures    Medications Ordered in ED Medications  acetaminophen (TYLENOL) tablet 1,000 mg (has no administration in time range)    ED Course/ Medical Decision Making/ A&P  Medical Decision Making Amount and/or Complexity of Data Reviewed Radiology: ordered.   Patient is a 76 year old female presented emergency room today with pain and bruising to most of her fingertips after they were crushed in garage door earlier today.  She has cap refill less than 2 seconds in all fingertips sensation intact and has full range of motion of all of her fingers.  X-rays of bilateral hands show no fractures of the fingertips.  She does have a significant subungual hematoma greater than 50% of right fourth finger.  This was trephinated by me.  Patient tolerated procedure well.  Given 1 dose of Tylenol for the pain and tingling and discomfort in her fingertips.  She will follow-up with her PCP.  Return precautions discussed.   Final Clinical Impression(s) / ED Diagnoses Final diagnoses:  Contusion of fingertip, initial encounter  Subungual hematoma of fingernail, initial encounter    Rx / DC Orders ED Discharge Orders     None         Tedd Sias, [place] 02/11/22 1359    Blanchie Dessert, MD 02/11/22 1441

## 2022-02-28 DIAGNOSIS — M25552 Pain in left hip: Secondary | ICD-10-CM | POA: Diagnosis not present

## 2022-02-28 DIAGNOSIS — M5451 Vertebrogenic low back pain: Secondary | ICD-10-CM | POA: Diagnosis not present

## 2022-02-28 DIAGNOSIS — M25551 Pain in right hip: Secondary | ICD-10-CM | POA: Diagnosis not present

## 2022-03-02 ENCOUNTER — Other Ambulatory Visit: Payer: Self-pay | Admitting: Internal Medicine

## 2022-03-02 DIAGNOSIS — R062 Wheezing: Secondary | ICD-10-CM

## 2022-03-03 DIAGNOSIS — L719 Rosacea, unspecified: Secondary | ICD-10-CM | POA: Diagnosis not present

## 2022-03-03 DIAGNOSIS — L814 Other melanin hyperpigmentation: Secondary | ICD-10-CM | POA: Diagnosis not present

## 2022-03-03 DIAGNOSIS — L821 Other seborrheic keratosis: Secondary | ICD-10-CM | POA: Diagnosis not present

## 2022-03-03 DIAGNOSIS — L578 Other skin changes due to chronic exposure to nonionizing radiation: Secondary | ICD-10-CM | POA: Diagnosis not present

## 2022-03-03 DIAGNOSIS — L439 Lichen planus, unspecified: Secondary | ICD-10-CM | POA: Diagnosis not present

## 2022-03-12 ENCOUNTER — Other Ambulatory Visit: Payer: Self-pay | Admitting: Cardiology

## 2022-03-25 DIAGNOSIS — M4807 Spinal stenosis, lumbosacral region: Secondary | ICD-10-CM | POA: Diagnosis not present

## 2022-03-25 DIAGNOSIS — M6281 Muscle weakness (generalized): Secondary | ICD-10-CM | POA: Diagnosis not present

## 2022-03-25 DIAGNOSIS — M7060 Trochanteric bursitis, unspecified hip: Secondary | ICD-10-CM | POA: Diagnosis not present

## 2022-04-04 DIAGNOSIS — M4807 Spinal stenosis, lumbosacral region: Secondary | ICD-10-CM | POA: Diagnosis not present

## 2022-04-04 DIAGNOSIS — M6281 Muscle weakness (generalized): Secondary | ICD-10-CM | POA: Diagnosis not present

## 2022-04-04 DIAGNOSIS — M7060 Trochanteric bursitis, unspecified hip: Secondary | ICD-10-CM | POA: Diagnosis not present

## 2022-04-04 DIAGNOSIS — M25551 Pain in right hip: Secondary | ICD-10-CM | POA: Diagnosis not present

## 2022-04-15 ENCOUNTER — Ambulatory Visit
Admission: EM | Admit: 2022-04-15 | Discharge: 2022-04-15 | Disposition: A | Discharge status: Home / Self Care | Payer: Medicare PPO | Attending: Family Medicine | Admitting: Family Medicine

## 2022-04-15 DIAGNOSIS — J0100 Acute maxillary sinusitis, unspecified: Secondary | ICD-10-CM | POA: Diagnosis not present

## 2022-04-15 DIAGNOSIS — J4521 Mild intermittent asthma with (acute) exacerbation: Secondary | ICD-10-CM

## 2022-04-15 MED ORDER — CEFDINIR 300 MG PO CAPS: 600.0000 mg | ORAL_CAPSULE | Freq: Every day | ORAL | 0 refills | Status: AC

## 2022-04-15 MED ORDER — PREDNISONE 20 MG PO TABS: 40.0000 mg | ORAL_TABLET | Freq: Every day | ORAL | 0 refills | Status: AC

## 2022-04-15 NOTE — ED Provider Notes (Signed)
[facility]    CSN: [id number] Arrival date & time: 04/15/22  8938      History   Chief Complaint Chief Complaint  Patient presents with   Cough    HPI Angela [name] is a 77 y.o. female.    Cough  For cough and congestion and wheezing.  Symptoms began on about December 24.  She had sore throat and nasal congestion and drainage at first.  The nasal congestion has continued and now she is having some pressure in her sinuses.  No fever at any point and no nausea or vomiting.  She has an albuterol inhaler at home and it has been helpful when she is used it.  She has not ever been formally diagnosed with asthma, but back in the fall her primary care had prescribed her an albuterol inhaler for what was going on at that time.  She has prediabetes, not diabetes.  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   Fibromyalgia    Gastroparesis    GERD (gastroesophageal reflux disease) 09/13/2007   Glaucoma    Hiatal hernia 04/25/2005   Hyperlipidemia    Hypertension    Hypothyroidism    IBS (irritable bowel syndrome)    Migraine    Osteoarthritis    Other and unspecified noninfectious gastroenteritis and colitis(558.9)    Sessile colonic polyp 05/30/2009   Vertigo    Vitamin D deficiency     Patient Active Problem List   Diagnosis Date Noted   Impaired fasting blood sugar 01/06/2022   Obesity 06/10/2021   Hot flashes 06/10/2021   Wheezing 12/31/2020   SOB (shortness of breath) on exertion 08/03/2020   Disequilibrium 07/25/2020   Cough 04/12/2019   Stress incontinence 10/23/2017   Bacterial vaginosis 09/21/2017   Atypical squamous  cells of undetermined significance (ASC-US) on cervical Pap smear 09/21/2017   Osteoarthritis    IBS (irritable bowel syndrome)    Hypertension    Glaucoma    Gastroparesis    Colon polyps    Bile salt-induced diarrhea    Anxiety    Acne rosacea    Routine general medical examination at a health care facility 03/03/2017   Abnormal CT of the head 11/10/2016   Vertigo 11/09/2016   Allergic rhinitis 01/28/5101   Lichen of skin 58/52/7782   Hemorrhoid 02/13/2015   Numbness and tingling in hands 05/15/2014   Cerebrovascular disease 10/18/2012   Arteriopathy, intracranial, fibromuscular dysplasia, chronic ([place]) 10/18/2012   Fibromuscular hyperplasia of artery (HCC)    Migraine headache 09/24/2011   GERD (gastroesophageal reflux disease) 08/16/2010   Hyperlipidemia 12/20/2009   Hypothyroidism 07/20/2009   Vitamin D deficiency 07/20/2009   Acute sinusitis 12/11/2008   Esophageal stricture 09/13/2007   Essential hypertension 05/14/2007   ACNE ROSACEA 05/14/2007   OSTEOARTHRITIS 05/14/2007   CAROTID BRUIT 05/14/2007   Fibromyalgia 03/30/2007   Hiatal hernia 04/25/2005   External hemorrhoids 05/10/2004    Past Surgical History:  Procedure Laterality Date   BUNIONECTOMY  09/04/2011   left   CESAREAN SECTION     CHOLECYSTECTOMY  1985   COLONOSCOPY     FOOT  OSTEOTOMY  2006   Right foot   ROTATOR CUFF REPAIR Right 04/19/2020   TUBAL LIGATION  1983   VAGINAL HYSTERECTOMY  1993    OB History   No obstetric history on file.      Home Medications    Prior to Admission medications   Medication Sig Start Date End Date Taking? Authorizing Provider  cefdinir (OMNICEF) 300 MG capsule Take 2 capsules (600 mg total) by mouth daily for 7 days. 04/15/22 04/22/22 Yes Kerston Landeck, Gwenlyn Perking, MD  predniSONE (DELTASONE) 20 MG tablet Take 2 tablets (40 mg total) by mouth daily with breakfast for 5 days. 04/15/22 04/20/22 Yes Barrett Henle, MD  albuterol (VENTOLIN HFA) 108 (90 Base) MCG/ACT  inhaler TAKE 2 PUFFS BY MOUTH EVERY 6 HOURS AS NEEDED FOR WHEEZE OR SHORTNESS OF BREATH 03/04/22   Hoyt Koch, MD  aspirin 81 MG tablet Take 81 mg by mouth daily.    [provider]  cholestyramine (QUESTRAN) 4 g packet TAKE 1 PACKET BY MOUTH DAILY. 06/04/21   Milus Janicia Monterrosa, MD  desonide (DESOWEN) 0.05 % cream Apply topically 2 (two) times daily. 12/22/16   Rosemarie Ax, MD  esomeprazole (NEXIUM) 40 MG capsule TAKE 1 CAPSULE BY MOUTH 2 TIMES DAILY BEFORE A MEAL. 10/24/21   Milus Abb Gobert, MD  famotidine (PEPCID) 20 MG tablet TAKE 1 TABLET BY MOUTH EVERYDAY AT BEDTIME 01/15/22   Mansouraty, Telford Nab., MD  fluocinonide gel (LIDEX) 0.05 % fluocinonide 0.05 % topical gel  APPLY 4-5 APPLICATIONS DAILY AS NEEDED    [provider]  ketoconazole (NIZORAL) 2 % cream Apply 1 application topically daily as needed for irritation.     [provider]  latanoprost (XALATAN) 0.005 % ophthalmic solution latanoprost 0.005 % eye drops  PLACE 1 DROP IN BOTH EYES AT BEDTIME    [provider]  levothyroxine (SYNTHROID) 50 MCG tablet TAKE 0.5 TABLETS (25 MCG TOTAL) BY MOUTH DAILY. 01/15/22   Hoyt Koch, MD  loratadine (CLARITIN) 10 MG tablet TAKE 1 TABLET EVERY DAY AS NEEDED FOR ALLEGIES 04/18/20   Marrian Salvage, FNP  meloxicam (MOBIC) 15 MG tablet Take 15 mg by mouth daily.    [provider]  metFORMIN (GLUCOPHAGE-XR) 500 MG 24 hr tablet Take 1 tablet (500 mg total) by mouth daily with breakfast. 01/06/22   Hoyt Koch, MD  metoprolol tartrate (LOPRESSOR) 50 MG tablet TAKE 1 TABLET BY MOUTH TWICE A DAY 03/13/22   Lelon Perla, MD  metroNIDAZOLE (METROCREAM) 0.75 % cream metronidazole 0.75 % topical cream    [provider]  mometasone (NASONEX) 50 MCG/ACT nasal spray Place 2 sprays into the nose daily.    [provider]  PARoxetine (PAXIL) 10 MG tablet Paxil 10 mg tablet  Take 1 tablet every day by oral  route.    [provider]  traMADol (ULTRAM) 50 MG tablet Take 50 mg by mouth as needed. 07/16/20   [provider]  triamcinolone cream (KENALOG) 0.1 % Apply 1 application topically 2 (two) times daily as needed (for breakout).     [provider]    Family History Family History  Problem Relation Age of Onset   Mitral valve prolapse Mother    Aneurysm Mother        cerebral   Hypertension Father    Colon polyps Father    Stroke Father    Hypertension Sister        x2  Diabetes Sister    Colon cancer Maternal Aunt    Stomach cancer Maternal Aunt    Colon polyps Sister    Pancreatic cancer Neg Hx    Migraines Neg Hx     Social History Social History   Tobacco Use   Smoking status: Never   Smokeless tobacco: Never  Vaping Use   Vaping Use: Never used  Substance Use Topics   Alcohol use: No   Drug use: No     Allergies   Shellfish allergy, Tetracycline, Effexor xr [venlafaxine hcl], Codeine, Iohexol, and Paroxetine   Review of Systems Review of Systems  Respiratory:  Positive for cough.      Physical Exam Triage Vital Signs ED Triage Vitals  Enc Vitals Group     BP 04/15/22 1004 (!) 148/74     Pulse Rate 04/15/22 1004 72     Resp 04/15/22 1004 16     Temp 04/15/22 1004 98.6 F (37 C)     Temp Source 04/15/22 1004 Oral     SpO2 04/15/22 1004 96 %     Weight --      Height --      Head Circumference --      Peak Flow --      Pain Score 04/15/22 1010 0     Pain Loc --      Pain Edu? --      Excl. in [place]? --    No data found.  Updated Vital Signs BP (!) 148/74 (BP Location: Right Arm)   Pulse 72   Temp 98.6 F (37 C) (Oral)   Resp 16   SpO2 96%   Visual Acuity Right Eye Distance:   Left Eye Distance:   Bilateral Distance:    Right Eye Near:   Left Eye Near:    Bilateral Near:     Physical Exam Vitals reviewed.  Constitutional:      General: She is not in acute distress.    Appearance: She is not  toxic-appearing.  HENT:     Right Ear: Tympanic membrane and ear canal normal.     Left Ear: Tympanic membrane and ear canal normal.     Nose: Nose normal.     Mouth/Throat:     Mouth: Mucous membranes are moist.     Comments: There is white and clear mucus draining Eyes:     Extraocular Movements: Extraocular movements intact.     Conjunctiva/sclera: Conjunctivae normal.     Pupils: Pupils are equal, round, and reactive to light.  Cardiovascular:     Rate and Rhythm: Normal rate and regular rhythm.     Heart sounds: No murmur heard. Pulmonary:     Effort: No respiratory distress.     Breath sounds: No stridor. No wheezing, rhonchi or rales.     Comments: No wheezes at the time of exam, but breath sounds are coarse. Musculoskeletal:     Cervical back: Neck supple.  Lymphadenopathy:     Cervical: No cervical adenopathy.  Skin:    Capillary Refill: Capillary refill takes less than 2 seconds.     Coloration: Skin is not jaundiced or pale.  Neurological:     General: No focal deficit present.     Mental Status: She is alert and oriented to person, place, and time.  Psychiatric:        Behavior: Behavior normal.      UC Treatments / Results  Labs (all labs ordered are listed, but only abnormal  results are displayed) Labs Reviewed - No data to display  EKG   Radiology No results found.  Procedures Procedures (including critical care time)  Medications Ordered in UC Medications - No data to display  Initial Impression / Assessment and Plan / UC Course  I have reviewed the triage vital signs and the nursing notes.  Pertinent labs & imaging results that were available during my care of the patient were reviewed by me and considered in my medical decision making (see chart for details).        I am going to treat for asthma exacerbation, and for sinusitis with the length of her symptoms and the double sickening and sinus pressure now.  I have asked her to follow-up  with her primary care, in case she needs evaluation and testing for asthma Final Clinical Impressions(s) / UC Diagnoses   Final diagnoses:  Acute maxillary sinusitis, recurrence not specified  Mild intermittent asthma with acute exacerbation     Discharge Instructions      Take prednisone 20 mg--2 daily for 5 days; this is for inflammation in your lungs that is causing you to wheeze  Continue using your albuterol inhaler at home as needed  Take cefdinir 300 mg--2 capsules together daily for 7 days; this is the antibiotic for possible sinus infection  Follow-up with your primary care, in case this does not completely resolve.  You may need further evaluation for asthma      ED Prescriptions     Medication Sig Dispense Auth. Provider   cefdinir (OMNICEF) 300 MG capsule Take 2 capsules (600 mg total) by mouth daily for 7 days. 14 capsule Barrett Henle, MD   predniSONE (DELTASONE) 20 MG tablet Take 2 tablets (40 mg total) by mouth daily with breakfast for 5 days. 10 tablet Windy Carina Gwenlyn Perking, MD      PDMP not reviewed this encounter.   Barrett Henle, MD 04/15/22 1048

## 2022-04-15 NOTE — Discharge Instructions (Signed)
Take prednisone 20 mg--2 daily for 5 days; this is for inflammation in your lungs that is causing you to wheeze  Continue using your albuterol inhaler at home as needed  Take cefdinir 300 mg--2 capsules together daily for 7 days; this is the antibiotic for possible sinus infection  Follow-up with your primary care, in case this does not completely resolve.  You may need further evaluation for asthma

## 2022-04-15 NOTE — ED Triage Notes (Signed)
Pt c/o cough, wheezing, nasal drainage x 9 days-NAD-steady gait

## 2022-04-30 DIAGNOSIS — M4807 Spinal stenosis, lumbosacral region: Secondary | ICD-10-CM | POA: Diagnosis not present

## 2022-04-30 DIAGNOSIS — M6281 Muscle weakness (generalized): Secondary | ICD-10-CM | POA: Diagnosis not present

## 2022-04-30 DIAGNOSIS — M7060 Trochanteric bursitis, unspecified hip: Secondary | ICD-10-CM | POA: Diagnosis not present

## 2022-05-12 NOTE — Progress Notes (Signed)
HPI: FU palpitations. Echo Sept 2011 showed normal LV function. A CardioNet was performed and revealed sinus rhythm with PVCs. Palpitations treated with beta blocker. Stress echocardiogram April 2015 normal. 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 Stenosis not changed compared to previous. Carotid Dopplers at [facility] April 2023 showed 60 to 79% right internal carotid artery stenosis and 40 to 59% left compatible with fibromuscular dysplasia.  Since I last saw her she occasionally has some dyspnea that improves with inhalers.  She denies increased dyspnea on exertion, orthopnea, PND, chest pain or syncope.  Palpitations are well-controlled.  Current Outpatient Medications  Medication Sig Dispense Refill   albuterol (VENTOLIN HFA) 108 (90 Base) MCG/ACT inhaler TAKE 2 PUFFS BY MOUTH EVERY 6 HOURS AS NEEDED FOR WHEEZE OR SHORTNESS OF BREATH 8.5 each 1   aspirin 81 MG tablet Take 81 mg by mouth daily.     cholestyramine (QUESTRAN) 4 g packet TAKE 1 PACKET BY MOUTH DAILY. 90 packet 2   desonide (DESOWEN) 0.05 % cream Apply topically 2 (two) times daily. 30 g 0   esomeprazole (NEXIUM) 40 MG capsule TAKE 1 CAPSULE BY MOUTH 2 TIMES DAILY BEFORE A MEAL. 180 capsule 1   famotidine (PEPCID) 20 MG tablet TAKE 1 TABLET BY MOUTH EVERYDAY AT BEDTIME 90 tablet 1   fluocinonide gel (LIDEX) 0.05 % fluocinonide 0.05 % topical gel  APPLY 4-5 APPLICATIONS DAILY AS NEEDED     ketoconazole (NIZORAL) 2 % cream Apply 1 application topically daily as needed for irritation.      latanoprost (XALATAN) 0.005 % ophthalmic solution latanoprost 0.005 % eye drops  PLACE 1 DROP IN BOTH EYES AT BEDTIME     levothyroxine (SYNTHROID) 50 MCG tablet TAKE 0.5 TABLETS (25 MCG TOTAL) BY MOUTH DAILY. 45 tablet 2   loratadine (CLARITIN) 10 MG tablet TAKE 1 TABLET EVERY DAY AS NEEDED FOR ALLEGIES 90 tablet 3   meloxicam (MOBIC) 15 MG tablet Take 15 mg by mouth daily.     metFORMIN (GLUCOPHAGE-XR) 500 MG 24 hr  tablet Take 1 tablet (500 mg total) by mouth daily with breakfast. 90 tablet 1   metoprolol tartrate (LOPRESSOR) 50 MG tablet TAKE 1 TABLET BY MOUTH TWICE A DAY 180 tablet 0   metroNIDAZOLE (METROCREAM) 0.75 % cream metronidazole 0.75 % topical cream     mometasone (NASONEX) 50 MCG/ACT nasal spray Place 2 sprays into the nose daily.     PARoxetine (PAXIL) 10 MG tablet Paxil 10 mg tablet  Take 1 tablet every day by oral route.     traMADol (ULTRAM) 50 MG tablet Take 50 mg by mouth as needed.     triamcinolone cream (KENALOG) 0.1 % Apply 1 application topically 2 (two) times daily as needed (for breakout).      No current facility-administered medications for this visit.     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   Fibromyalgia    Gastroparesis    GERD (gastroesophageal reflux disease) 09/13/2007   Glaucoma    Hiatal hernia 04/25/2005   Hyperlipidemia    Hypertension    Hypothyroidism  IBS (irritable bowel syndrome)    Migraine    Osteoarthritis    Other and unspecified noninfectious gastroenteritis and colitis(558.9)    Sessile colonic polyp 05/30/2009   Vertigo    Vitamin D deficiency     Past Surgical History:  Procedure Laterality Date   BUNIONECTOMY  09/04/2011   left   [place]   COLONOSCOPY     FOOT OSTEOTOMY  2006   Right foot   ROTATOR CUFF REPAIR Right 04/19/2020   TUBAL LIGATION  1983   VAGINAL HYSTERECTOMY  1993    Social History   Socioeconomic History   Marital status: Married    Spouse name: Not on file   Number of children: 2   Years of education: Not on file   Highest education  level: Not on file  Occupational History   Occupation: Retired  Tobacco Use   Smoking status: Never   Smokeless tobacco: Never  Vaping Use   Vaping Use: Never used  Substance and Sexual Activity   Alcohol use: No   Drug use: No   Sexual activity: Not on file  Other Topics Concern   Not on file  Social History Narrative   Lives at home with husband   Right handed   Caffeine: 2 cups/day, sweet tea   Social Determinants of Health   Financial Resource Strain: Low Risk  (01/27/2022)   Overall Financial Resource Strain (CARDIA)    Difficulty of Paying Living Expenses: Not hard at all  Food Insecurity: No Food Insecurity (01/27/2022)   Hunger Vital Sign    Worried About Running Out of Food in the Last Year: Never true    [place] in the Last Year: Never true  Transportation Needs: No Transportation Needs (01/27/2022)   PRAPARE - Hydrologist (Medical): No    Lack of Transportation (Non-Medical): No  Physical Activity: Insufficiently Active (01/27/2022)   Exercise Vital Sign    Days of Exercise per Week: 7 days    Minutes of Exercise per Session: 20 min  Stress: No Stress Concern Present (01/27/2022)   [place]    Feeling of Stress : Not at all  Social Connections: [place] (01/27/2022)   Social Connection and Isolation Panel [NHANES]    Frequency of Communication with Friends and Family: More than three times a week    Frequency of Social Gatherings with Friends and Family: More than three times a week    Attends Religious Services: More than 4 times per year    Active Member of [employer] or Organizations: Yes    Attends Music therapist: More than 4 times per year    Marital Status: Married  Human resources officer Violence: Not At Risk (01/27/2022)   Humiliation, Afraid, Rape, and Kick questionnaire    Fear of Current or Ex-Partner: No    Emotionally Abused:  No    Physically Abused: No    Sexually Abused: No    Family History  Problem Relation Age of Onset   Mitral valve prolapse Mother    Aneurysm Mother        cerebral   Hypertension Father    Colon polyps Father    Stroke Father    Hypertension Sister        x2   Diabetes Sister    Colon cancer Maternal Aunt    Stomach cancer Maternal Aunt  Colon polyps Sister    Pancreatic cancer Neg Hx    Migraines Neg Hx     ROS: no fevers or chills, productive cough, hemoptysis, dysphasia, odynophagia, melena, hematochezia, dysuria, hematuria, rash, seizure activity, orthopnea, PND, pedal edema, claudication. Remaining systems are negative.  Physical Exam: Well-developed well-nourished in no acute distress.  Skin is warm and dry.  HEENT is normal.  Neck is supple.  Right carotid bruit noted Chest is clear to auscultation with normal expansion.  Cardiovascular exam is regular rate and rhythm.  Abdominal exam nontender or distended. No masses palpated. Extremities show no edema. neuro grossly intact  ECG-sinus bradycardia at a rate of 50, no ST changes.  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  Continue present medications.  2 hypertension-blood pressure controlled.  Continue present medical regimen.  3 palpitations-continue beta-blocker.  Kirk Ruths, MD

## 2022-05-14 DIAGNOSIS — M6281 Muscle weakness (generalized): Secondary | ICD-10-CM | POA: Diagnosis not present

## 2022-05-14 DIAGNOSIS — M7060 Trochanteric bursitis, unspecified hip: Secondary | ICD-10-CM | POA: Diagnosis not present

## 2022-05-14 DIAGNOSIS — M4807 Spinal stenosis, lumbosacral region: Secondary | ICD-10-CM | POA: Diagnosis not present

## 2022-05-15 ENCOUNTER — Other Ambulatory Visit: Payer: Self-pay | Admitting: Gastroenterology

## 2022-05-23 ENCOUNTER — Ambulatory Visit: Payer: Medicare PPO | Attending: Cardiology | Admitting: Cardiology

## 2022-05-23 ENCOUNTER — Encounter: Payer: Self-pay | Admitting: Cardiology

## 2022-05-23 VITALS — BP 126/72 | HR 50 | Ht 62.0 in | Wt 179.0 lb

## 2022-05-23 DIAGNOSIS — I10 Essential (primary) hypertension: Secondary | ICD-10-CM | POA: Diagnosis not present

## 2022-05-23 DIAGNOSIS — I773 Arterial fibromuscular dysplasia: Secondary | ICD-10-CM | POA: Diagnosis not present

## 2022-05-23 NOTE — Patient Instructions (Signed)
    Follow-Up: At [facility], you and your health needs are our priority.  As part of our continuing mission to provide you with exceptional heart care, we have created designated Provider Care Teams.  These Care Teams include your primary Cardiologist (physician) and Advanced Practice Providers (APPs -  Physician Assistants and Nurse Practitioners) who all work together to provide you with the care you need, when you need it.  We recommend signing up for the patient portal called "MyChart".  Sign up information is provided on this After Visit Summary.  MyChart is used to connect with patients for Virtual Visits (Telemedicine).  Patients are able to view lab/test results, encounter notes, upcoming appointments, etc.  Non-urgent messages can be sent to your provider as well.   To learn more about what you can do with MyChart, go to [email or web address].    Your next appointment:   12 month(s)  Provider:   Brian Crenshaw, MD     

## 2022-05-26 DIAGNOSIS — M6281 Muscle weakness (generalized): Secondary | ICD-10-CM | POA: Diagnosis not present

## 2022-05-26 DIAGNOSIS — M7060 Trochanteric bursitis, unspecified hip: Secondary | ICD-10-CM | POA: Diagnosis not present

## 2022-05-26 DIAGNOSIS — M4807 Spinal stenosis, lumbosacral region: Secondary | ICD-10-CM | POA: Diagnosis not present

## 2022-06-05 ENCOUNTER — Ambulatory Visit: Payer: Medicare PPO | Admitting: Nurse Practitioner

## 2022-06-25 DIAGNOSIS — H26493 Other secondary cataract, bilateral: Secondary | ICD-10-CM | POA: Diagnosis not present

## 2022-06-25 DIAGNOSIS — H401131 Primary open-angle glaucoma, bilateral, mild stage: Secondary | ICD-10-CM | POA: Diagnosis not present

## 2022-06-25 DIAGNOSIS — H04123 Dry eye syndrome of bilateral lacrimal glands: Secondary | ICD-10-CM | POA: Diagnosis not present

## 2022-06-25 DIAGNOSIS — H524 Presbyopia: Secondary | ICD-10-CM | POA: Diagnosis not present

## 2022-06-25 DIAGNOSIS — H52203 Unspecified astigmatism, bilateral: Secondary | ICD-10-CM | POA: Diagnosis not present

## 2022-06-25 DIAGNOSIS — H43813 Vitreous degeneration, bilateral: Secondary | ICD-10-CM | POA: Diagnosis not present

## 2022-06-25 LAB — OPHTHALMOLOGY REPORT-SCANNED

## 2022-06-30 ENCOUNTER — Telehealth: Payer: Self-pay | Admitting: Nurse Practitioner

## 2022-06-30 ENCOUNTER — Ambulatory Visit: Payer: Medicare PPO | Admitting: Nurse Practitioner

## 2022-06-30 NOTE — Progress Notes (Deleted)
06/30/2022 Angela [name] [id number]:[id number] 1945/08/01   Chief Complaint:  History of Present Illness: Angela [name]. Reposa a past medical history of  fibromyalgia, migraine headache, carotid artery hyperplasia, GERD, esophageal stricture, gastroparesis, hiatal hernia, diverticulitis 05/2015, IBS and colon polyps.  Past cholecystectomy C-section, tubal ligation and hysterectomy.      Latest Ref Rng & Units 12/03/2021    2:44 PM 11/24/2019    3:03 PM 08/22/2019    5:05 PM  CBC  WBC 4.0 - 10.5 K/uL 6.2  6.2  7.2   Hemoglobin 12.0 - 15.0 g/dL 13.1  12.5  12.6   Hematocrit 36.0 - 46.0 % 38.4  37.3  36.0   Platelets 150.0 - 400.0 K/uL 290.0  286  286        Latest Ref Rng & Units 02/10/2022   10:47 AM 12/20/2021    1:24 PM 12/03/2021    2:44 PM  CMP  Glucose 65 - 99 mg/dL   136   BUN 7 - 25 mg/dL   12   Creatinine 0.44 - 1.00 mg/dL 0.90   0.93   Sodium 135 - 146 mmol/L   141   Potassium 3.5 - 5.3 mmol/L   3.8   Chloride 98 - 110 mmol/L   103   CO2 20 - 32 mmol/L   23   Calcium 8.6 - 10.4 mg/dL   10.0   Total Protein 6.0 - 8.3 g/dL  7.1  6.7   Total Bilirubin 0.2 - 1.2 mg/dL  0.4  0.3   Alkaline Phos 39 - 117 U/L  69    AST 0 - 37 U/L  43  47   ALT 0 - 35 U/L  31  33     Small bowel enterography 02/10/2022: FINDINGS: Lower chest: No acute findings. Heart is at the upper limits of normal in size to mildly enlarged. No pericardial or pleural effusion. Distal esophagus is unremarkable.   Hepatobiliary: Liver is decreased in attenuation diffusely. Cholecystectomy. No unexpected biliary ductal dilatation.   Pancreas: Negative.   Spleen: Negative.   Adrenals/Urinary Tract: Adrenal glands are unremarkable. Subcentimeter low-attenuation lesions in the kidneys, too small to characterize. No specific follow-up necessary. Ureters are decompressed. Bladder is relatively low in volume.   Stomach/Bowel: Stomach, small bowel, appendix and colon are unremarkable. Hyperattenuation in the  region of the rectum (3/190 8-206), which is collapsed.   Vascular/Lymphatic: Atherosclerotic calcification of the aorta. No pathologically enlarged lymph nodes.   Reproductive: Hysterectomy.  No adnexal mass.   Other: No free fluid. Mesenteries and peritoneum are unremarkable. Small bilateral inguinal hernias contain fat.   Musculoskeletal: Degenerative changes in the spine.   IMPRESSION: 1. No findings to explain the patient's pain. 2. Hyperattenuation in the region of the rectum is nonspecific and may be due to hemorrhoids. Rectal carcinoma cannot be excluded. 3. Hepatic steatosis. 4.  Aortic atherosclerosis     CTAP 12/12/2021:  FINDINGS: Lower chest: No acute abnormality.   Hepatobiliary: Diffuse hepatic steatosis. No focal liver abnormality. Status post cholecystectomy. The common increase caliber of the CBD measures 9 mm. This is unchanged compared with previous imaging.   Pancreas: Unremarkable. No pancreatic ductal dilatation or surrounding inflammatory changes.   Spleen: Normal in size without focal abnormality.   Adrenals/Urinary Tract: Normal adrenal glands. No nephrolithiasis or hydronephrosis. No suspicious kidney mass. Tiny fat attenuating lesion within the inferior pole of the right kidney measures 4 mm and is compatible with a benign angiomyolipoma. Urinary bladder  is unremarkable.   Stomach/Bowel: Stomach appears normal. The appendix is visualized and appears normal. There is diffuse colonic diverticulosis. No signs of acute diverticulitis. No pathologic dilatation of the large or small bowel loops. There is mild wall thickening involving the small bowel loops without surrounding inflammatory fat stranding. Cannot exclude mild nonspecific enteritis.   Vascular/Lymphatic: No significant vascular findings are present. No enlarged abdominal or pelvic lymph nodes.   Reproductive: Status post hysterectomy. No adnexal masses.   Other: No free fluid or  fluid collections. No signs of pneumoperitoneum.   Musculoskeletal: No acute or significant osseous findings.   IMPRESSION: 1. There is mild wall thickening involving the small bowel loops without surrounding inflammatory fat stranding. Cannot exclude mild nonspecific enteritis. 2. Colonic diverticulosis without signs of acute diverticulitis. 3. No free fluid or fluid collections. No signs of bowel obstruction. 4. Hepatic steatosis.    PAST GI PROCEDURES:   EGD 02/15/2020: Moderate, non-specific distal gastritis. Biopsied to check for H. pylori. - The examination was otherwise normal. ANTRAL AND OXYNTIC MUCOSA WITH SLIGHT CHRONIC INFLAMMATION AND REACTIVE CHANGES. - WARTHIN-STARRY NEGATIVE FOR HELICOBACTER PYLORI. - NO INTESTINAL METAPLASIA, DYSPLASIA OR CARCINOMA.   Colonoscopy 02/15/2020: - Diverticulosis in the entire examined colon. - Small external hemorrhoids. - The examination was otherwise normal on direct and retroflexion views. - No polyps or cancers.- -No further colon cancer screening   Colonoscopy 10/27/2011: -Diverticulosis throughout the colon -Excellent bowel prep -Recall colonoscopy 10 years   EGD 10/27/2011: -Mild gastritis in the antrum -Hiatal hernia -Normal duodenal folds -Normal esophagus      Current Medications, Allergies, Past Medical History, Past Surgical History, Family History and Social History were reviewed in [employer] record.   Review of Systems:   Constitutional: Negative for fever, sweats, chills or weight loss.  Respiratory: Negative for shortness of breath.   Cardiovascular: Negative for chest pain, palpitations and leg swelling.  Gastrointestinal: See HPI.  Musculoskeletal: Negative for back pain or muscle aches.  Neurological: Negative for dizziness, headaches or paresthesias.    Physical Exam: There were no vitals taken for this visit. General: in no acute distress. Head: Normocephalic and  atraumatic. Eyes: No scleral icterus. Conjunctiva pink . Ears: Normal auditory acuity. Mouth: Dentition intact. No ulcers or lesions.  Lungs: Clear throughout to auscultation. Heart: Regular rate and rhythm, no murmur. Abdomen: Soft, nontender and nondistended. No masses or hepatomegaly. Normal bowel sounds x 4 quadrants.  Rectal: *** Musculoskeletal: Symmetrical with no gross deformities. Extremities: No edema. Neurological: Alert oriented x 4. No focal deficits.  Psychological: Alert and cooperative. Normal mood and affect  Assessment and Recommendations:  Hepatic steatosis per CT 01/2022

## 2022-06-30 NOTE — Telephone Encounter (Signed)
PT has an appointment at 11am and just called to confirm the time. I advised her she has to be here by 1115 and she said she would.

## 2022-07-03 ENCOUNTER — Other Ambulatory Visit (INDEPENDENT_AMBULATORY_CARE_PROVIDER_SITE_OTHER): Payer: Medicare PPO

## 2022-07-03 ENCOUNTER — Encounter: Payer: Self-pay | Admitting: Nurse Practitioner

## 2022-07-03 ENCOUNTER — Ambulatory Visit: Payer: Medicare PPO | Admitting: Nurse Practitioner

## 2022-07-03 VITALS — BP 106/76 | HR 82 | Ht 61.0 in | Wt 180.0 lb

## 2022-07-03 DIAGNOSIS — K219 Gastro-esophageal reflux disease without esophagitis: Secondary | ICD-10-CM

## 2022-07-03 DIAGNOSIS — K649 Unspecified hemorrhoids: Secondary | ICD-10-CM

## 2022-07-03 DIAGNOSIS — K625 Hemorrhage of anus and rectum: Secondary | ICD-10-CM

## 2022-07-03 LAB — BASIC METABOLIC PANEL
BUN: 11 mg/dL (ref 6–23)
CO2: 30 mEq/L (ref 19–32)
Calcium: 9.6 mg/dL (ref 8.4–10.5)
Chloride: 104 mEq/L (ref 96–112)
Creatinine, Ser: 0.89 mg/dL (ref 0.40–1.20)
GFR: 63.19 mL/min (ref >60.00)
Glucose, Bld: 140 mg/dL — ABNORMAL HIGH (ref 70–99)
Potassium: 3.7 mEq/L (ref 3.5–5.1)
Sodium: 141 mEq/L (ref 135–145)

## 2022-07-03 LAB — CBC WITH DIFFERENTIAL/PLATELET
Basophils Absolute: 0.1 10*3/uL (ref 0.0–0.1)
Basophils Relative: 1 % (ref 0.0–3.0)
Eosinophils Absolute: 0.2 10*3/uL (ref 0.0–0.7)
Eosinophils Relative: 2.6 % (ref 0.0–5.0)
HCT: 38.7 % (ref 36.0–46.0)
Hemoglobin: 13.2 g/dL (ref 12.0–15.0)
Lymphocytes Relative: 44.1 % (ref 12.0–46.0)
Lymphs Abs: 2.7 10*3/uL (ref 0.7–4.0)
MCHC: 34.1 g/dL (ref 30.0–36.0)
MCV: 94.6 fl (ref 78.0–100.0)
Monocytes Absolute: 0.7 10*3/uL (ref 0.1–1.0)
Monocytes Relative: 11.5 % (ref 3.0–12.0)
Neutro Abs: 2.5 10*3/uL (ref 1.4–7.7)
Neutrophils Relative %: 40.8 % — ABNORMAL LOW (ref 43.0–77.0)
Platelets: 288 10*3/uL (ref 150.0–400.0)
RBC: 4.09 Mil/uL (ref 3.87–5.11)
RDW: 13.6 % (ref 11.5–15.5)
WBC: 6.2 10*3/uL (ref 4.0–10.5)

## 2022-07-03 LAB — HEPATIC FUNCTION PANEL
ALT: 28 U/L (ref 0–35)
AST: 37 U/L (ref 0–37)
Albumin: 4.2 g/dL (ref 3.5–5.2)
Alkaline Phosphatase: 71 U/L (ref 39–117)
Bilirubin, Direct: 0.1 mg/dL (ref 0.0–0.3)
Total Bilirubin: 0.4 mg/dL (ref 0.2–1.2)
Total Protein: 7.2 g/dL (ref 6.0–8.3)

## 2022-07-03 NOTE — Progress Notes (Signed)
07/03/2022 Angela [name] [id number] 02-16-1946   Chief Complaint: Discuss CT results  History of Present Illness: Angela Foppe. [name] a past medical history of  fibromyalgia, migraine headache, carotid artery hyperplasia, GERD, esophageal stricture, gastroparesis, hiatal hernia, diverticulitis 05/2015, IBS and colon polyps. Past cholecystectomy C-section, tubal ligation and hysterectomy.    I last saw Angela [name] in office on 12/03/2021 due to having upper abdominal discomfort with bloat and increased burping.  At that time, she was instructed to continue Esomeprazole 40 mg twice daily and to add Famotidine 20 mg nightly and Gas-X twice daily. Due to complaints of LUQ pain and LLQ tenderness on exam, an abdominal/pelvic CT was ordered and was completed 12/12/2019. The CT identified mild wall thickening to the small bowel suggestive of nonspecific enteritis, diverticulosis without diverticulitis and diffuse hepatic steatosis.  Fecal calprotectin level was elevated at 154. A small bowel enterography was done 02/10/2022 which showed a normal small bowel with hyperattenuation in the rectum which was nonspecific likely due to the rectum not being distended verse possibly hemorrhoids but rectal carcinoma could not be excluded. Dr. Rush Landmark discussed performing a flexible sigmoidoscopy to rule out rectal cancer but he felt the likelihood of cancer was quite low. Her most recent colonoscopy was 02/15/2020 showed diverticulosis in the entire colon and small external hemorrhoids.   She is having less abdominal bloat. She has intermittent burping.  No significant heartburn. She typically passes a normal formed [place] bowel movement most days.  If she passes several loose stools she develops bright red blood on the toilet tissue which she attributes to having hemorrhoids.  Her hemorrhoids sometimes prolapse and she tries to push the hemorrhoids back inside the rectum.  She is not currently exercising on a  regular basis but she intends to start walking as tolerated.  She drinks sweet tea every other day and she plans on cutting this out of her diet.      Latest Ref Rng & Units 12/03/2021    2:44 PM 11/24/2019    3:03 PM 08/22/2019    5:05 PM  CBC  WBC 4.0 - 10.5 K/uL 6.2  6.2  7.2   Hemoglobin 12.0 - 15.0 g/dL 13.1  12.5  12.6   Hematocrit 36.0 - 46.0 % 38.4  37.3  36.0   Platelets 150.0 - 400.0 K/uL 290.0  286  286        Latest Ref Rng & Units 02/10/2022   10:47 AM 12/20/2021    1:24 PM 12/03/2021    2:44 PM  CMP  Glucose 65 - 99 mg/dL   136   BUN 7 - 25 mg/dL   12   Creatinine 0.44 - 1.00 mg/dL 0.90   0.93   Sodium 135 - 146 mmol/L   141   Potassium 3.5 - 5.3 mmol/L   3.8   Chloride 98 - 110 mmol/L   103   CO2 20 - 32 mmol/L   23   Calcium 8.6 - 10.4 mg/dL   10.0   Total Protein 6.0 - 8.3 g/dL  7.1  6.7   Total Bilirubin 0.2 - 1.2 mg/dL  0.4  0.3   Alkaline Phos 39 - 117 U/L  69    AST 0 - 37 U/L  43  47   ALT 0 - 35 U/L  31  33      Small bowel CT 02/10/2022: FINDINGS: Lower chest: No acute findings. Heart is at the upper limits of normal in  size to mildly enlarged. No pericardial or pleural effusion. Distal esophagus is unremarkable.   Hepatobiliary: Liver is decreased in attenuation diffusely. Cholecystectomy. No unexpected biliary ductal dilatation.   Pancreas: Negative.   Spleen: Negative.   Adrenals/Urinary Tract: Adrenal glands are unremarkable. Subcentimeter low-attenuation lesions in the kidneys, too small to characterize. No specific follow-up necessary. Ureters are decompressed. Bladder is relatively low in volume.   Stomach/Bowel: Stomach, small bowel, appendix and colon are unremarkable. Hyperattenuation in the region of the rectum (3/190 8-206), which is collapsed.   Vascular/Lymphatic: Atherosclerotic calcification of the aorta. No pathologically enlarged lymph nodes.   Reproductive: Hysterectomy.  No adnexal mass.   Other: No free fluid.  Mesenteries and peritoneum are unremarkable. Small bilateral inguinal hernias contain fat.   Musculoskeletal: Degenerative changes in the spine.   IMPRESSION: 1. No findings to explain the patient's pain. 2. Hyperattenuation in the region of the rectum is nonspecific and may be due to hemorrhoids. Rectal carcinoma cannot be excluded. 3. Hepatic steatosis. 4.  Aortic atherosclerosis   CTAP 12/12/2021: FINDINGS: Lower chest: No acute abnormality.   Hepatobiliary: Diffuse hepatic steatosis. No focal liver abnormality. Status post cholecystectomy. The common increase caliber of the CBD measures 9 mm. This is unchanged compared with previous imaging.   Pancreas: Unremarkable. No pancreatic ductal dilatation or surrounding inflammatory changes.   Spleen: Normal in size without focal abnormality.   Adrenals/Urinary Tract: Normal adrenal glands. No nephrolithiasis or hydronephrosis. No suspicious kidney mass. Tiny fat attenuating lesion within the inferior pole of the right kidney measures 4 mm and is compatible with a benign angiomyolipoma. Urinary bladder is unremarkable.   Stomach/Bowel: Stomach appears normal. The appendix is visualized and appears normal. There is diffuse colonic diverticulosis. No signs of acute diverticulitis. No pathologic dilatation of the large or small bowel loops. There is mild wall thickening involving the small bowel loops without surrounding inflammatory fat stranding. Cannot exclude mild nonspecific enteritis.   Vascular/Lymphatic: No significant vascular findings are present. No enlarged abdominal or pelvic lymph nodes.   Reproductive: Status post hysterectomy. No adnexal masses.   Other: No free fluid or fluid collections. No signs of pneumoperitoneum.   Musculoskeletal: No acute or significant osseous findings.   IMPRESSION: 1. There is mild wall thickening involving the small bowel loops without surrounding inflammatory fat stranding.  Cannot exclude mild nonspecific enteritis. 2. Colonic diverticulosis without signs of acute diverticulitis. 3. No free fluid or fluid collections. No signs of bowel obstruction. 4. Hepatic steatosis.    PAST GI PROCEDURES:   EGD 02/15/2020: Moderate, non-specific distal gastritis. Biopsied to check for H. pylori. - The examination was otherwise normal. ANTRAL AND OXYNTIC MUCOSA WITH SLIGHT CHRONIC INFLAMMATION AND REACTIVE CHANGES. - WARTHIN-STARRY NEGATIVE FOR HELICOBACTER PYLORI. - NO INTESTINAL METAPLASIA, DYSPLASIA OR CARCINOMA.   Colonoscopy 02/15/2020: - Diverticulosis in the entire examined colon. - Small external hemorrhoids. - The examination was otherwise normal on direct and retroflexion views. - No polyps or cancers.- -No further colon cancer screening   Colonoscopy 10/27/2011: -Diverticulosis throughout the colon -Excellent bowel prep -Recall colonoscopy 10 years   EGD 10/27/2011: -Mild gastritis in the antrum -Hiatal hernia -Normal duodenal folds -Normal esophagus  Current Outpatient Medications on File Prior to Visit  Medication Sig Dispense Refill   albuterol (VENTOLIN HFA) 108 (90 Base) MCG/ACT inhaler TAKE 2 PUFFS BY MOUTH EVERY 6 HOURS AS NEEDED FOR WHEEZE OR SHORTNESS OF BREATH 8.5 each 1   aspirin 81 MG tablet Take 81 mg by mouth daily.  cholestyramine (QUESTRAN) 4 g packet TAKE 1 PACKET BY MOUTH DAILY. 90 packet 2   desonide (DESOWEN) 0.05 % cream Apply topically 2 (two) times daily. 30 g 0   esomeprazole (NEXIUM) 40 MG capsule TAKE 1 CAPSULE BY MOUTH 2 TIMES DAILY BEFORE A MEAL. 180 capsule 1   famotidine (PEPCID) 20 MG tablet TAKE 1 TABLET BY MOUTH EVERYDAY AT BEDTIME 90 tablet 1   fluocinonide gel (LIDEX) 0.05 % fluocinonide 0.05 % topical gel  APPLY 4-5 APPLICATIONS DAILY AS NEEDED     ketoconazole (NIZORAL) 2 % cream Apply 1 application topically daily as needed for irritation.      latanoprost (XALATAN) 0.005 % ophthalmic solution latanoprost  0.005 % eye drops  PLACE 1 DROP IN BOTH EYES AT BEDTIME     levothyroxine (SYNTHROID) 50 MCG tablet TAKE 0.5 TABLETS (25 MCG TOTAL) BY MOUTH DAILY. 45 tablet 2   loratadine (CLARITIN) 10 MG tablet TAKE 1 TABLET EVERY DAY AS NEEDED FOR ALLEGIES 90 tablet 3   meloxicam (MOBIC) 15 MG tablet Take 15 mg by mouth daily.     metoprolol tartrate (LOPRESSOR) 50 MG tablet TAKE 1 TABLET BY MOUTH TWICE A DAY 180 tablet 0   metroNIDAZOLE (METROCREAM) 0.75 % cream metronidazole 0.75 % topical cream     mometasone (NASONEX) 50 MCG/ACT nasal spray Place 2 sprays into the nose daily.     PARoxetine (PAXIL) 10 MG tablet Paxil 10 mg tablet  Take 1 tablet every day by oral route.     traMADol (ULTRAM) 50 MG tablet Take 50 mg by mouth as needed.     triamcinolone cream (KENALOG) 0.1 % Apply 1 application topically 2 (two) times daily as needed (for breakout).      metFORMIN (GLUCOPHAGE-XR) 500 MG 24 hr tablet Take 1 tablet (500 mg total) by mouth daily with breakfast. (Patient not taking: Reported on 07/03/2022) 90 tablet 1   No current facility-administered medications on file prior to visit.   Allergies  Allergen Reactions   Shellfish Allergy Anaphylaxis   Tetracycline Other (See Comments)    GI bleed   Effexor Xr [Venlafaxine Hcl] Other (See Comments)    Nightmare and vivid dreams   Codeine Nausea And Vomiting and Other (See Comments)    GI upset/vomit   Iohexol Hives    Per Dr.Grady patient doesn't need a 13 hour prep, only itching - patient already had a rash due to autoimmune condition //rls    Paroxetine Rash and Other (See Comments)    Current Medications, Allergies, Past Medical History, Past Surgical History, Family History and Social History were reviewed in [employer] record.  Review of Systems:   Constitutional: Negative for fever, sweats, chills or weight loss.  Respiratory: Negative for shortness of breath.   Cardiovascular: Negative for chest pain, palpitations and  leg swelling.  Gastrointestinal: See HPI.  Musculoskeletal: Negative for back pain or muscle aches.  Neurological: Negative for dizziness, headaches or paresthesias.   Physical Exam: BP 106/76   Pulse 82   Ht 5\' 1"  (1.549 m)   Wt 180 lb (81.6 kg)   BMI 34.01 kg/m   General: 77 year old female in no acute distress. Head: Normocephalic and atraumatic. Eyes: No scleral icterus. Conjunctiva pink . Ears: Normal auditory acuity. Mouth: Dentition intact. No ulcers or lesions.  Lungs: Clear throughout to auscultation. Heart: Regular rate and rhythm, no murmur. Abdomen: Soft, nontender and nondistended. No masses or hepatomegaly. Normal bowel sounds x 4 quadrants.  Rectal: Deferred. Musculoskeletal:  Symmetrical with no gross deformities. Extremities: No edema. Neurological: Alert oriented x 4. No focal deficits.  Psychological: Alert and cooperative. Normal mood and affect  Assessment and Recommendations:  77 year old female who previously had LUQ and LLQ pain. CTAP 11/2021 showed questionable enteritis. Elevated fecal calprotectin level. Small bowel enterography 02/10/2022 showed a normal small bowel with hyperattenuation in the rectum which was nonspecific likely due to the rectum not being distended verse possibly hemorrhoids but rectal carcinoma could not be excluded. Dr. Rush Landmark discussed performing a flexible sigmoidoscopy to rule out rectal cancer but he felt the likelihood of cancer was quite low. Her most recent colonoscopy 02/15/2020 showed diverticulosis in the entire colon and small external hemorrhoids.  -Patient elects to proceed with a flexible sigmoidoscopy benefits and risks discussed including risk with sedation, risk of bleeding, perforation and infection  -CBC, BMP -Further recommendations to be determined after the above evaluation completed   Hepatic steatosis  -Reduce carbohydrate intake ie: reduce bread/pasta/potato/rice and sweets, exercise as tolerated and lose  weight to reduce the risk of developing fatty liver disease -Hepatic panel  History of colon polyps in 2011. Colonoscopy 02/15/2020 showed diverticulosis throughout the colon, no polyps.  -No further colon polyps surveillance colonoscopies recommended due to age  GERD, burping  -Continue Esomeprazole 40mg  po bid for now -Add Famotidine 20mg  Q HS

## 2022-07-03 NOTE — Progress Notes (Signed)
Attending Physician's Attestation   I have reviewed the chart.   I agree with the Advanced Practitioner's note, impression, and recommendations with any updates as below.    Penelope Fittro Mansouraty, MD [facility] Office # [phone]  

## 2022-07-03 NOTE — Patient Instructions (Addendum)
Take Famotidine 20mg  one tab at bed time.  Continue Esomeprazole 40mg  once daily to be taken 30 minutes before breakfast   Apply a small amount of Desitin inside the anal opening and to the external anal area three times daily as needed for anal or hemorrhoidal irritation/bleeding.   Reduce the carbohydrates in your diet as we discussed, reduce bread/pasta/rice/potato and sweets, exercise as tolerated and loose weight to reduce your risk of developing fatty liver disease   You have been scheduled for a flexible sigmoidoscopy. Please follow the written instructions given to you at your visit today. If you use inhalers (even only as needed), please bring them with you on the day of your procedure.   Due to recent changes in healthcare laws, you may see the results of your imaging and laboratory studies on MyChart before your provider has had a chance to review them.  We understand that in some cases there may be results that are confusing or concerning to you. Not all laboratory results come back in the same time frame and the provider may be waiting for multiple results in order to interpret others.  Please give [place] 48 hours in order for your provider to thoroughly review all the results before contacting the office for clarification of your results.

## 2022-07-09 ENCOUNTER — Ambulatory Visit: Payer: Medicare PPO | Admitting: Internal Medicine

## 2022-07-09 ENCOUNTER — Encounter: Payer: Self-pay | Admitting: Internal Medicine

## 2022-07-09 ENCOUNTER — Telehealth: Payer: Self-pay

## 2022-07-09 VITALS — BP 122/68 | HR 64 | Temp 98.5°F | Ht 61.0 in | Wt 179.0 lb

## 2022-07-09 DIAGNOSIS — E039 Hypothyroidism, unspecified: Secondary | ICD-10-CM

## 2022-07-09 DIAGNOSIS — E785 Hyperlipidemia, unspecified: Secondary | ICD-10-CM

## 2022-07-09 DIAGNOSIS — K9089 Other intestinal malabsorption: Secondary | ICD-10-CM

## 2022-07-09 DIAGNOSIS — K7581 Nonalcoholic steatohepatitis (NASH): Secondary | ICD-10-CM | POA: Diagnosis not present

## 2022-07-09 DIAGNOSIS — R7301 Impaired fasting glucose: Secondary | ICD-10-CM

## 2022-07-09 LAB — LIPID PANEL
Cholesterol: 219 mg/dL — ABNORMAL HIGH (ref 0–200)
HDL: 56.8 mg/dL (ref >39.00)
LDL Cholesterol: 131 mg/dL — ABNORMAL HIGH (ref 0–99)
NonHDL: 161.94
Total CHOL/HDL Ratio: 4
Triglycerides: 156 mg/dL — ABNORMAL HIGH (ref 0.0–149.0)
VLDL: 31.2 mg/dL (ref 0.0–40.0)

## 2022-07-09 LAB — TSH: TSH: 1.1 u[IU]/mL (ref 0.35–5.50)

## 2022-07-09 LAB — HEMOGLOBIN A1C: Hgb A1c MFr Bld: 6.7 % — ABNORMAL HIGH (ref 4.6–6.5)

## 2022-07-09 NOTE — Telephone Encounter (Signed)
Contacted pt and pt is aware of lab results. Pt also states she is going to see her PCP today.

## 2022-07-09 NOTE — Telephone Encounter (Signed)
Inbound call from patient returning call. Please advise.  Thank you  

## 2022-07-09 NOTE — Progress Notes (Signed)
   Subjective:   Patient ID: Angela [name], female    DOB: [birth year], 77 y.o.   MRN: [id number]  HPI The patient is a 77 YO female coming in for concerns about recent healthcare events and questions.   Review of Systems  Constitutional:  Positive for appetite change.  HENT: Negative.    Eyes: Negative.   Respiratory:  Negative for cough, chest tightness and shortness of breath.   Cardiovascular:  Negative for chest pain, palpitations and leg swelling.  Gastrointestinal:  Positive for abdominal distention, blood in stool and diarrhea. Negative for abdominal pain, constipation, nausea and vomiting.  Musculoskeletal: Negative.   Skin: Negative.   Neurological: Negative.   Psychiatric/Behavioral: Negative.      Objective:  Physical Exam Constitutional:      Appearance: She is well-developed.  HENT:     Head: Normocephalic and atraumatic.  Cardiovascular:     Rate and Rhythm: Normal rate and regular rhythm.  Pulmonary:     Effort: Pulmonary effort is normal. No respiratory distress.     Breath sounds: Normal breath sounds. No wheezing or rales.  Abdominal:     General: Bowel sounds are normal. There is no distension.     Palpations: Abdomen is soft.     Tenderness: There is abdominal tenderness. There is no rebound.  Musculoskeletal:     Cervical back: Normal range of motion.  Skin:    General: Skin is warm and dry.  Neurological:     Mental Status: She is alert and oriented to person, place, and time.     Coordination: Coordination normal.     Vitals:   07/09/22 1015  BP: 122/68  Pulse: 64  Temp: 98.5 F (36.9 C)  TempSrc: Oral  SpO2: 99%  Weight: 179 lb (81.2 kg)  Height: 5\' 1"  (1.549 m)    Assessment & Plan:  Visit time 25 minutes in face to face communication with patient and coordination of care, additional 10 minutes spent in record review, coordination or care, ordering tests, communicating/referring to other healthcare professionals, documenting in  medical records all on the same day of the visit for total time 35 minutes spent on the visit.

## 2022-07-09 NOTE — Patient Instructions (Signed)
We will check the labs today. If needed we can send in topiramate to help with weight.

## 2022-07-09 NOTE — Telephone Encounter (Signed)
Contacted pt and lvm to call back regarding lab results

## 2022-07-10 DIAGNOSIS — K7581 Nonalcoholic steatohepatitis (NASH): Secondary | ICD-10-CM | POA: Insufficient documentation

## 2022-07-10 NOTE — Assessment & Plan Note (Signed)
She recently followed up with GI and was unable to tolerate even 1 dose of metformin due to diarrhea. She had thickening in the rectal area on CT scan and will be getting flex sig in the near future. She is needing to change GI doctors due to hers being out and has a recommendation she may want to see.

## 2022-07-10 NOTE — Assessment & Plan Note (Signed)
Explained the etiology and treatment of this. She is working on dietary changes and weight loss.

## 2022-07-10 NOTE — Assessment & Plan Note (Addendum)
Blood sugar elevated on non-fasting labs through GI. Previously has been pre-diabetes. Checking HgA1c to assess. We did try metformin to see if this could help with metabolic changes and pre-diabetes but she was unable to tolerate this.

## 2022-07-10 NOTE — Assessment & Plan Note (Signed)
Checking lipid panel and adjust as needed. Not taking statin. 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

## 2022-07-10 NOTE — Assessment & Plan Note (Signed)
Checking TSH and adjust levothyroxine 50 mcg daily as needed. No clinical symptoms of over or under replacement.

## 2022-07-18 ENCOUNTER — Other Ambulatory Visit: Payer: Self-pay | Admitting: Gastroenterology

## 2022-07-18 ENCOUNTER — Other Ambulatory Visit: Payer: Self-pay | Admitting: Internal Medicine

## 2022-07-18 MED ORDER — TOPIRAMATE 50 MG PO TABS: 50.0000 mg | ORAL_TABLET | Freq: Every day | ORAL | 3 refills | Status: DC

## 2022-07-18 MED ORDER — ROSUVASTATIN CALCIUM 20 MG PO TABS: 20.0000 mg | ORAL_TABLET | Freq: Every day | ORAL | 3 refills | Status: DC

## 2022-07-23 DIAGNOSIS — I6523 Occlusion and stenosis of bilateral carotid arteries: Secondary | ICD-10-CM | POA: Diagnosis not present

## 2022-07-23 DIAGNOSIS — I679 Cerebrovascular disease, unspecified: Secondary | ICD-10-CM | POA: Diagnosis not present

## 2022-07-23 DIAGNOSIS — I10 Essential (primary) hypertension: Secondary | ICD-10-CM | POA: Diagnosis not present

## 2022-07-30 DIAGNOSIS — I679 Cerebrovascular disease, unspecified: Secondary | ICD-10-CM | POA: Diagnosis not present

## 2022-07-30 DIAGNOSIS — M797 Fibromyalgia: Secondary | ICD-10-CM | POA: Diagnosis not present

## 2022-07-30 DIAGNOSIS — I10 Essential (primary) hypertension: Secondary | ICD-10-CM | POA: Diagnosis not present

## 2022-07-30 DIAGNOSIS — E559 Vitamin D deficiency, unspecified: Secondary | ICD-10-CM | POA: Diagnosis not present

## 2022-07-30 DIAGNOSIS — R42 Dizziness and giddiness: Secondary | ICD-10-CM | POA: Diagnosis not present

## 2022-07-30 DIAGNOSIS — R232 Flushing: Secondary | ICD-10-CM | POA: Diagnosis not present

## 2022-07-30 DIAGNOSIS — I773 Arterial fibromuscular dysplasia: Secondary | ICD-10-CM | POA: Diagnosis not present

## 2022-08-07 ENCOUNTER — Ambulatory Visit: Payer: Medicare PPO | Admitting: Nurse Practitioner

## 2022-08-18 ENCOUNTER — Other Ambulatory Visit: Payer: Self-pay | Admitting: Internal Medicine

## 2022-08-21 ENCOUNTER — Encounter: Payer: Self-pay | Admitting: Certified Registered Nurse Anesthetist

## 2022-08-25 ENCOUNTER — Telehealth: Payer: Self-pay | Admitting: Nurse Practitioner

## 2022-08-25 NOTE — Telephone Encounter (Signed)
Pt was given the number to my chart help [phone])  to assist with locating her letter for her prep instructions: Pt notified to call back if she was not able to locate them. Pt verbalized understanding with all questions answered.

## 2022-08-25 NOTE — Telephone Encounter (Signed)
Angela [name], please advise.

## 2022-08-25 NOTE — Telephone Encounter (Signed)
Inbound call from patient, states she was not given instructions for her procedure on 5/15 during her last visit. Patient is not able to maneuver MyChart for her instructions, she would like a nurse to call her and go over instructions for the procedure.

## 2022-08-27 ENCOUNTER — Encounter: Payer: Self-pay | Admitting: Gastroenterology

## 2022-08-27 ENCOUNTER — Ambulatory Visit (AMBULATORY_SURGERY_CENTER): Payer: Medicare PPO | Admitting: Gastroenterology

## 2022-08-27 VITALS — BP 127/59 | HR 66 | Temp 98.0°F | Resp 21 | Ht 61.0 in | Wt 180.0 lb

## 2022-08-27 DIAGNOSIS — K921 Melena: Secondary | ICD-10-CM

## 2022-08-27 DIAGNOSIS — K625 Hemorrhage of anus and rectum: Secondary | ICD-10-CM

## 2022-08-27 DIAGNOSIS — K649 Unspecified hemorrhoids: Secondary | ICD-10-CM | POA: Diagnosis not present

## 2022-08-27 DIAGNOSIS — M797 Fibromyalgia: Secondary | ICD-10-CM | POA: Diagnosis not present

## 2022-08-27 DIAGNOSIS — K623 Rectal prolapse: Secondary | ICD-10-CM | POA: Diagnosis not present

## 2022-08-27 DIAGNOSIS — I10 Essential (primary) hypertension: Secondary | ICD-10-CM | POA: Diagnosis not present

## 2022-08-27 DIAGNOSIS — E039 Hypothyroidism, unspecified: Secondary | ICD-10-CM | POA: Diagnosis not present

## 2022-08-27 DIAGNOSIS — F419 Anxiety disorder, unspecified: Secondary | ICD-10-CM | POA: Diagnosis not present

## 2022-08-27 MED ORDER — HYDROCORTISONE ACETATE 25 MG RE SUPP: 25.0000 mg | Freq: Every evening | RECTAL | 1 refills | Status: DC

## 2022-08-27 MED ORDER — SODIUM CHLORIDE 0.9 % IV SOLN: 500.0000 mL | Freq: Once | INTRAVENOUS | Status: DC

## 2022-08-27 NOTE — Progress Notes (Signed)
Called to room to assist during endoscopic procedure.  Patient ID and intended procedure confirmed with present staff. Received instructions for my participation in the procedure from the performing physician.  

## 2022-08-27 NOTE — Patient Instructions (Addendum)
Use FiberCon 1-2 tablets by mouth dialy.  Await pathology results.  Use Anusol suppositories therapy 3 nights in a row, a prescription was sent to your pharmacy.  High fiber diet.  Handouts on hemorrhoids, banding and high fiber diet provided.  YOU HAD AN ENDOSCOPIC PROCEDURE TODAY AT THE [facility]:   Refer to the procedure report that was given to you for any specific questions about what was found during the examination.  If the procedure report does not answer your questions, please call your gastroenterologist to clarify.  If you requested that your care partner not be given the details of your procedure findings, then the procedure report has been included in a sealed envelope for you to review at your convenience later.  YOU SHOULD EXPECT: Some feelings of bloating in the abdomen. Passage of more gas than usual.  Walking can help get rid of the air that was put into your GI tract during the procedure and reduce the bloating. If you had a lower endoscopy (such as a colonoscopy or flexible sigmoidoscopy) you may notice spotting of blood in your stool or on the toilet paper. If you underwent a bowel prep for your procedure, you may not have a normal bowel movement for a few days.  Please Note:  You might notice some irritation and congestion in your nose or some drainage.  This is from the oxygen used during your procedure.  There is no need for concern and it should clear up in a day or so.  SYMPTOMS TO REPORT IMMEDIATELY:  Following lower endoscopy (colonoscopy or flexible sigmoidoscopy):  Excessive amounts of blood in the stool  Significant tenderness or worsening of abdominal pains  Swelling of the abdomen that is new, acute  Fever of 100F or higher   For urgent or emergent issues, a gastroenterologist can be reached at any hour by calling [phone]. Do not use MyChart messaging for urgent concerns.    DIET:  We do recommend a small meal at first, but then you  may proceed to your regular diet.  Drink plenty of fluids but you should avoid alcoholic beverages for 24 hours.  ACTIVITY:  You should plan to take it easy for the rest of today and you should NOT DRIVE or use heavy machinery until tomorrow (because of the sedation medicines used during the test).    FOLLOW UP: Our staff will call the number listed on your records the next business day following your procedure.  We will call around 7:15- 8:00 am to check on you and address any questions or concerns that you may have regarding the information given to you following your procedure. If we do not reach you, we will leave a message.     If any biopsies were taken you will be contacted by phone or by letter within the next 1-3 weeks.  Please call us at [phone] if you have not heard about the biopsies in 3 weeks.    SIGNATURES/CONFIDENTIALITY: You and/or your care partner have signed paperwork which will be entered into your electronic medical record.  These signatures attest to the fact that that the information above on your After Visit Summary has been reviewed and is understood.  Full responsibility of the confidentiality of this discharge information lies with you and/or your care-partner.

## 2022-08-27 NOTE — Op Note (Signed)
[facility] Patient Name: Angela [name] Procedure Date: 08/27/2022 11:55 AM MRN: [id number] Endoscopist: Corliss Parish , MD, [id number] Age: 77 Referring MD:  Date of Birth: 1945-12-02 Gender: Female Account #: [id number] Procedure:                Flexible Sigmoidoscopy Indications:              Hematochezia, Abnormal CT of the GI tract Medicines:                Monitored Anesthesia Care Procedure:                Pre-Anesthesia Assessment:                           - Prior to the procedure, a History and Physical                            was performed, and patient medications and                            allergies were reviewed. The patient's tolerance of                            previous anesthesia was also reviewed. The risks                            and benefits of the procedure and the sedation                            options and risks were discussed with the patient.                            All questions were answered, and informed consent                            was obtained. Prior Anticoagulants: The patient has                            taken no anticoagulant or antiplatelet agents                            except for aspirin. ASA Grade Assessment: II - A                            patient with mild systemic disease. After reviewing                            the risks and benefits, the patient was deemed in                            satisfactory condition to undergo the procedure.                           After obtaining informed consent, the scope was  passed under direct vision. The [id number]-[id number] Slim SN                            [id number] was introduced through the anus and                            advanced to the the left transverse colon. The                            flexible sigmoidoscopy was accomplished without                            difficulty. The patient tolerated the procedure.                             The quality of the bowel preparation was adequate. Scope In: Scope Out: Findings:                 The digital rectal exam findings include                            hemorrhoids. Pertinent negatives include no                            palpable rectal lesions.                           Multiple small-mouthed diverticula were found in                            the recto-sigmoid colon, sigmoid colon and                            descending colon.                           Normal mucosa was found in the entire visualized                            colon.                           Normal mucosa was found in the rectum. Biopsies                            were taken with a cold forceps for histology to                            rule out proctitis.                           Non-bleeding non-thrombosed external and internal                            hemorrhoids were found during retroflexion, during  perianal exam and during digital exam. The                            hemorrhoids were Grade II (internal hemorrhoids                            that prolapse but reduce spontaneously). Complications:            No immediate complications. Estimated Blood Loss:     Estimated blood loss was minimal. Impression:               - Hemorrhoids found on digital rectal exam.                           - Diverticulosis in the recto-sigmoid colon, in the                            sigmoid colon and in the descending colon.                           - Normal mucosa in the entire visualized colon.                           - Normal mucosa in the rectum. Biopsied.                           - Non-bleeding non-thrombosed external and internal                            hemorrhoids. Recommendation:           - The patient will be observed post-procedure,                            until all discharge criteria are met.                           - Discharge patient to home.                            - Patient has a contact number available for                            emergencies. The signs and symptoms of potential                            delayed complications were discussed with the                            patient. Return to normal activities tomorrow.                            Written discharge instructions were provided to the                            patient.                           -  High fiber diet.                           - Use FiberCon 1-2 tablets PO daily.                           - Await pathology results.                           - If rectal bleeding continues to be an issue, can                            consider Anusol suppositories therapy versus                            over-the-counter Calmol-4 suppository use as needed.                           - If continued rectal bleeding through conservative                            measures, consider internal hemorrhoidal banding                            with one of our partners.                           - If evidence of proctitis will need to consider                            additional therapies.                           - The findings and recommendations were discussed                            with the patient.                           - The findings and recommendations were discussed                            with the patient's family. Corliss Parish, MD 08/27/2022 12:16:41 PM

## 2022-08-27 NOTE — Progress Notes (Unsigned)
GASTROENTEROLOGY PROCEDURE H&P NOTE   Primary Care Physician: Myrlene Broker, MD  HPI: Angela [name] is a 77 y.o. female who presents for Flexible sigmoidoscopy to evaluate rectal bleeding and abnormal imaging of the rectum in 2023.  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   Fibromyalgia    Gastroparesis    GERD (gastroesophageal reflux disease) 09/13/2007   Glaucoma    Hiatal hernia 04/25/2005   Hyperlipidemia    Hypertension    Hypothyroidism    IBS (irritable bowel syndrome)    Migraine    Osteoarthritis    Other and unspecified noninfectious gastroenteritis and colitis(558.9)    Sessile colonic polyp 05/30/2009   Vertigo    Vitamin D deficiency    Past Surgical History:  Procedure Laterality Date   BUNIONECTOMY  09/04/2011   left   CESAREAN SECTION     CHOLECYSTECTOMY  1985   COLONOSCOPY     FOOT OSTEOTOMY  2006   Right foot   ROTATOR CUFF REPAIR Right 04/19/2020   TUBAL LIGATION  1983   VAGINAL HYSTERECTOMY  1993   Current Outpatient Medications  Medication Sig Dispense Refill   rosuvastatin (CRESTOR) 20 MG tablet Take 1 tablet (20 mg total) by mouth daily. 90 tablet 3   topiramate (TOPAMAX) 50 MG tablet Take 1 tablet (50 mg total) by mouth daily. 30 tablet 3   albuterol (VENTOLIN HFA) 108 (90 Base) MCG/ACT inhaler TAKE 2 PUFFS BY MOUTH EVERY 6 HOURS AS NEEDED FOR WHEEZE OR SHORTNESS OF BREATH 8.5 each 1   aspirin 81 MG tablet Take 81 mg by mouth daily.     cholestyramine (QUESTRAN) 4 g packet TAKE 1 PACKET DAILY BY MOUTH 90 packet 0   ergocalciferol (DRISDOL) 1.25 MG (50000 UT) capsule Take 50,000 Units by mouth daily at  2 am. (Patient not taking: Reported on 07/09/2022)     esomeprazole (NEXIUM) 40 MG capsule TAKE 1 CAPSULE BY MOUTH 2 TIMES DAILY BEFORE A MEAL. 180 capsule 1   famotidine (PEPCID) 20 MG tablet TAKE 1 TABLET BY MOUTH EVERYDAY AT BEDTIME 90 tablet 1   fluocinonide gel (LIDEX) 0.05 % fluocinonide 0.05 % topical gel  APPLY 4-5 APPLICATIONS DAILY AS NEEDED (Patient not taking: Reported on 07/09/2022)     hydrocortisone 2.5 % cream Apply 1 Application topically as needed.     ketoconazole (NIZORAL) 2 % cream Apply 1 application topically daily as needed for irritation.      latanoprost (XALATAN) 0.005 % ophthalmic solution latanoprost 0.005 % eye drops  PLACE 1 DROP IN BOTH EYES AT BEDTIME     levothyroxine (SYNTHROID) 50 MCG tablet TAKE 0.5 TABLETS (25 MCG TOTAL) BY MOUTH DAILY. 45 tablet 2   meloxicam (MOBIC) 15 MG tablet Take 15 mg by mouth daily.     metoprolol tartrate (LOPRESSOR) 50 MG tablet TAKE 1 TABLET BY MOUTH TWICE A DAY 180 tablet 0   metroNIDAZOLE (METROCREAM) 0.75 % cream metronidazole 0.75 % topical cream     mometasone (NASONEX) 50 MCG/ACT nasal spray Place 2 sprays into the nose daily.     PARoxetine (PAXIL) 10 MG tablet  Paxil 10 mg tablet  Take 1 tablet every day by oral route.     traMADol (ULTRAM) 50 MG tablet Take 50 mg by mouth as needed.     triamcinolone cream (KENALOG) 0.1 % Apply 1 application topically 2 (two) times daily as needed (for breakout).      Current Facility-Administered Medications  Medication Dose Route Frequency Provider Last Rate Last Admin   0.9 %  sodium chloride infusion  500 mL Intravenous Once Mansouraty, Netty Starring., MD        Current Outpatient Medications:    rosuvastatin (CRESTOR) 20 MG tablet, Take 1 tablet (20 mg total) by mouth daily., Disp: 90 tablet, Rfl: 3   topiramate (TOPAMAX) 50 MG tablet, Take 1 tablet (50 mg total) by mouth daily., Disp: 30 tablet, Rfl: 3   albuterol (VENTOLIN HFA) 108 (90 Base) MCG/ACT inhaler, TAKE 2 PUFFS BY MOUTH  EVERY 6 HOURS AS NEEDED FOR WHEEZE OR SHORTNESS OF BREATH, Disp: 8.5 each, Rfl: 1   aspirin 81 MG tablet, Take 81 mg by mouth daily., Disp: , Rfl:    cholestyramine (QUESTRAN) 4 g packet, TAKE 1 PACKET DAILY BY MOUTH, Disp: 90 packet, Rfl: 0   ergocalciferol (DRISDOL) 1.25 MG (50000 UT) capsule, Take 50,000 Units by mouth daily at 2 am. (Patient not taking: Reported on 07/09/2022), Disp: , Rfl:    esomeprazole (NEXIUM) 40 MG capsule, TAKE 1 CAPSULE BY MOUTH 2 TIMES DAILY BEFORE A MEAL., Disp: 180 capsule, Rfl: 1   famotidine (PEPCID) 20 MG tablet, TAKE 1 TABLET BY MOUTH EVERYDAY AT BEDTIME, Disp: 90 tablet, Rfl: 1   fluocinonide gel (LIDEX) 0.05 %, fluocinonide 0.05 % topical gel  APPLY 4-5 APPLICATIONS DAILY AS NEEDED (Patient not taking: Reported on 07/09/2022), Disp: , Rfl:    hydrocortisone 2.5 % cream, Apply 1 Application topically as needed., Disp: , Rfl:    ketoconazole (NIZORAL) 2 % cream, Apply 1 application topically daily as needed for irritation. , Disp: , Rfl:    latanoprost (XALATAN) 0.005 % ophthalmic solution, latanoprost 0.005 % eye drops  PLACE 1 DROP IN BOTH EYES AT BEDTIME, Disp: , Rfl:    levothyroxine (SYNTHROID) 50 MCG tablet, TAKE 0.5 TABLETS (25 MCG TOTAL) BY MOUTH DAILY., Disp: 45 tablet, Rfl: 2   meloxicam (MOBIC) 15 MG tablet, Take 15 mg by mouth daily., Disp: , Rfl:    metoprolol tartrate (LOPRESSOR) 50 MG tablet, TAKE 1 TABLET BY MOUTH TWICE A DAY, Disp: 180 tablet, Rfl: 0   metroNIDAZOLE (METROCREAM) 0.75 % cream, metronidazole 0.75 % topical cream, Disp: , Rfl:    mometasone (NASONEX) 50 MCG/ACT nasal spray, Place 2 sprays into the nose daily., Disp: , Rfl:    PARoxetine (PAXIL) 10 MG tablet, Paxil 10 mg tablet  Take 1 tablet every day by oral route., Disp: , Rfl:    traMADol (ULTRAM) 50 MG tablet, Take 50 mg by mouth as needed., Disp: , Rfl:    triamcinolone cream (KENALOG) 0.1 %, Apply 1 application topically 2 (two) times daily as needed (for breakout). , Disp: ,  Rfl:   Current Facility-Administered Medications:    0.9 %  sodium chloride infusion, 500 mL, Intravenous, Once, Mansouraty, Netty Starring., MD Allergies  Allergen Reactions   Shellfish Allergy Anaphylaxis   Tetracycline Other (See Comments)    GI bleed   Effexor Xr [Venlafaxine Hcl] Other (See Comments)    Nightmare and vivid dreams   Codeine Nausea And Vomiting and Other (See Comments)    GI upset/vomit  Iohexol Hives    Per Dr.Grady patient doesn't need a 13 hour prep, only itching - patient already had a rash due to autoimmune condition //rls    Paroxetine Rash and Other (See Comments)   Family History  Problem Relation Age of Onset   Mitral valve prolapse Mother    Aneurysm Mother        cerebral   Hypertension Father    Colon polyps Father    Stroke Father    Hypertension Sister        x2   Diabetes Sister    Colon cancer Maternal Aunt    Stomach cancer Maternal Aunt    Colon polyps Sister    Pancreatic cancer Neg Hx    Migraines Neg Hx    Social History   Socioeconomic History   Marital status: Married    Spouse name: Not on file   Number of children: 2   Years of education: Not on file   Highest education level: Not on file  Occupational History   Occupation: Retired  Tobacco Use   Smoking status: Never   Smokeless tobacco: Never  Vaping Use   Vaping Use: Never used  Substance and Sexual Activity   Alcohol use: No   Drug use: No   Sexual activity: Not on file  Other Topics Concern   Not on file  Social History Narrative   Lives at home with husband   Right handed   Caffeine: 2 cups/day, sweet tea   Social Determinants of Health   Financial Resource Strain: Low Risk  (01/27/2022)   Overall Financial Resource Strain (CARDIA)    Difficulty of Paying Living Expenses: Not hard at all  Food Insecurity: No Food Insecurity (01/27/2022)   Hunger Vital Sign    Worried About Running Out of Food in the Last Year: Never true    Ran Out of Food in the Last  Year: Never true  Transportation Needs: No Transportation Needs (01/27/2022)   PRAPARE - Administrator, Civil Service (Medical): No    Lack of Transportation (Non-Medical): No  Physical Activity: Insufficiently Active (01/27/2022)   Exercise Vital Sign    Days of Exercise per Week: 7 days    Minutes of Exercise per Session: 20 min  Stress: No Stress Concern Present (01/27/2022)   [employer] of Occupational Health - Occupational Stress Questionnaire    Feeling of Stress : Not at all  Social Connections: Socially Integrated (01/27/2022)   Social Connection and Isolation Panel [NHANES]    Frequency of Communication with Friends and Family: More than three times a week    Frequency of Social Gatherings with Friends and Family: More than three times a week    Attends Religious Services: More than 4 times per year    Active Member of [employer] or Organizations: Yes    Attends Banker Meetings: More than 4 times per year    Marital Status: Married  Catering manager Violence: Not At Risk (01/27/2022)   Humiliation, Afraid, Rape, and Kick questionnaire    Fear of Current or Ex-Partner: No    Emotionally Abused: No    Physically Abused: No    Sexually Abused: No    Physical Exam: Today's Vitals   08/27/22 1106  BP: 117/65  Pulse: 70  Temp: 98 F (36.7 C)  TempSrc: Skin  SpO2: 95%  Weight: 180 lb (81.6 kg)  Height: 5\' 1"  (1.549 m)   Body mass index is 34.01 kg/m. GEN:  NAD EYE: Sclerae anicteric ENT: MMM CV: Non-tachycardic GI: Soft, NT/ND NEURO:  Alert & Oriented x 3  Lab Results: No results for input(s): "WBC", "HGB", "HCT", "PLT" in the last 72 hours. BMET No results for input(s): "NA", "K", "CL", "CO2", "GLUCOSE", "BUN", "CREATININE", "CALCIUM" in the last 72 hours. LFT No results for input(s): "PROT", "ALBUMIN", "AST", "ALT", "ALKPHOS", "BILITOT", "BILIDIR", "IBILI" in the last 72 hours. PT/INR No results for input(s): "LABPROT", "INR" in  the last 72 hours.   Impression / Plan: This is a 77 y.o.female who presents for Flexible sigmoidoscopy to evaluate rectal bleeding and abnormal imaging of the rectum in 2023.  The risks and benefits of endoscopic evaluation/treatment were discussed with the patient and/or family; these include but are not limited to the risk of perforation, infection, bleeding, missed lesions, lack of diagnosis, severe illness requiring hospitalization, as well as anesthesia and sedation related illnesses.  The patient's history has been reviewed, patient examined, no change in status, and deemed stable for procedure.  The patient and/or family is agreeable to proceed.    Corliss Parish, MD  Gastroenterology Advanced Endoscopy Office # [phone]

## 2022-08-27 NOTE — Progress Notes (Unsigned)
Report given to PACU, vss 

## 2022-08-27 NOTE — Progress Notes (Signed)
Pt's states no medical or surgical changes since previsit or office visit. 

## 2022-08-28 ENCOUNTER — Telehealth: Payer: Self-pay

## 2022-08-28 NOTE — Telephone Encounter (Signed)
Attempted follow up phone call post flex sig. No answer; left VM.

## 2022-09-01 ENCOUNTER — Encounter: Payer: Self-pay | Admitting: Gastroenterology

## 2022-09-10 DIAGNOSIS — M542 Cervicalgia: Secondary | ICD-10-CM | POA: Diagnosis not present

## 2022-09-10 DIAGNOSIS — M25512 Pain in left shoulder: Secondary | ICD-10-CM | POA: Diagnosis not present

## 2022-09-18 DIAGNOSIS — S161XXD Strain of muscle, fascia and tendon at neck level, subsequent encounter: Secondary | ICD-10-CM | POA: Diagnosis not present

## 2022-09-18 DIAGNOSIS — M25612 Stiffness of left shoulder, not elsewhere classified: Secondary | ICD-10-CM | POA: Diagnosis not present

## 2022-09-18 DIAGNOSIS — M6281 Muscle weakness (generalized): Secondary | ICD-10-CM | POA: Diagnosis not present

## 2022-09-23 ENCOUNTER — Other Ambulatory Visit: Payer: Self-pay | Admitting: Gastroenterology

## 2022-09-24 DIAGNOSIS — S161XXD Strain of muscle, fascia and tendon at neck level, subsequent encounter: Secondary | ICD-10-CM | POA: Diagnosis not present

## 2022-09-24 DIAGNOSIS — M25612 Stiffness of left shoulder, not elsewhere classified: Secondary | ICD-10-CM | POA: Diagnosis not present

## 2022-09-24 DIAGNOSIS — M6281 Muscle weakness (generalized): Secondary | ICD-10-CM | POA: Diagnosis not present

## 2022-09-26 ENCOUNTER — Other Ambulatory Visit: Payer: Self-pay | Admitting: Internal Medicine

## 2022-09-26 DIAGNOSIS — S161XXD Strain of muscle, fascia and tendon at neck level, subsequent encounter: Secondary | ICD-10-CM | POA: Diagnosis not present

## 2022-09-26 DIAGNOSIS — M25612 Stiffness of left shoulder, not elsewhere classified: Secondary | ICD-10-CM | POA: Diagnosis not present

## 2022-09-26 DIAGNOSIS — M6281 Muscle weakness (generalized): Secondary | ICD-10-CM | POA: Diagnosis not present

## 2022-09-30 DIAGNOSIS — L710 Perioral dermatitis: Secondary | ICD-10-CM | POA: Diagnosis not present

## 2022-09-30 DIAGNOSIS — E785 Hyperlipidemia, unspecified: Secondary | ICD-10-CM | POA: Diagnosis not present

## 2022-09-30 DIAGNOSIS — I129 Hypertensive chronic kidney disease with stage 1 through stage 4 chronic kidney disease, or unspecified chronic kidney disease: Secondary | ICD-10-CM | POA: Diagnosis not present

## 2022-09-30 DIAGNOSIS — I773 Arterial fibromuscular dysplasia: Secondary | ICD-10-CM | POA: Diagnosis not present

## 2022-09-30 DIAGNOSIS — Z881 Allergy status to other antibiotic agents status: Secondary | ICD-10-CM | POA: Diagnosis not present

## 2022-09-30 DIAGNOSIS — M1990 Unspecified osteoarthritis, unspecified site: Secondary | ICD-10-CM | POA: Diagnosis not present

## 2022-09-30 DIAGNOSIS — I499 Cardiac arrhythmia, unspecified: Secondary | ICD-10-CM | POA: Diagnosis not present

## 2022-09-30 DIAGNOSIS — Z833 Family history of diabetes mellitus: Secondary | ICD-10-CM | POA: Diagnosis not present

## 2022-09-30 DIAGNOSIS — E1122 Type 2 diabetes mellitus with diabetic chronic kidney disease: Secondary | ICD-10-CM | POA: Diagnosis not present

## 2022-09-30 DIAGNOSIS — F419 Anxiety disorder, unspecified: Secondary | ICD-10-CM | POA: Diagnosis not present

## 2022-09-30 DIAGNOSIS — J4520 Mild intermittent asthma, uncomplicated: Secondary | ICD-10-CM | POA: Diagnosis not present

## 2022-09-30 DIAGNOSIS — Z7982 Long term (current) use of aspirin: Secondary | ICD-10-CM | POA: Diagnosis not present

## 2022-09-30 DIAGNOSIS — K219 Gastro-esophageal reflux disease without esophagitis: Secondary | ICD-10-CM | POA: Diagnosis not present

## 2022-09-30 DIAGNOSIS — E669 Obesity, unspecified: Secondary | ICD-10-CM | POA: Diagnosis not present

## 2022-09-30 DIAGNOSIS — Z825 Family history of asthma and other chronic lower respiratory diseases: Secondary | ICD-10-CM | POA: Diagnosis not present

## 2022-09-30 DIAGNOSIS — H409 Unspecified glaucoma: Secondary | ICD-10-CM | POA: Diagnosis not present

## 2022-09-30 DIAGNOSIS — E039 Hypothyroidism, unspecified: Secondary | ICD-10-CM | POA: Diagnosis not present

## 2022-09-30 DIAGNOSIS — Z6833 Body mass index (BMI) 33.0-33.9, adult: Secondary | ICD-10-CM | POA: Diagnosis not present

## 2022-10-01 DIAGNOSIS — M6281 Muscle weakness (generalized): Secondary | ICD-10-CM | POA: Diagnosis not present

## 2022-10-01 DIAGNOSIS — M25612 Stiffness of left shoulder, not elsewhere classified: Secondary | ICD-10-CM | POA: Diagnosis not present

## 2022-10-01 DIAGNOSIS — S161XXD Strain of muscle, fascia and tendon at neck level, subsequent encounter: Secondary | ICD-10-CM | POA: Diagnosis not present

## 2022-10-03 DIAGNOSIS — S161XXD Strain of muscle, fascia and tendon at neck level, subsequent encounter: Secondary | ICD-10-CM | POA: Diagnosis not present

## 2022-10-03 DIAGNOSIS — M25612 Stiffness of left shoulder, not elsewhere classified: Secondary | ICD-10-CM | POA: Diagnosis not present

## 2022-10-03 DIAGNOSIS — M6281 Muscle weakness (generalized): Secondary | ICD-10-CM | POA: Diagnosis not present

## 2022-10-06 DIAGNOSIS — M545 Low back pain, unspecified: Secondary | ICD-10-CM | POA: Diagnosis not present

## 2022-10-08 DIAGNOSIS — S161XXD Strain of muscle, fascia and tendon at neck level, subsequent encounter: Secondary | ICD-10-CM | POA: Diagnosis not present

## 2022-10-08 DIAGNOSIS — M6281 Muscle weakness (generalized): Secondary | ICD-10-CM | POA: Diagnosis not present

## 2022-10-08 DIAGNOSIS — M25612 Stiffness of left shoulder, not elsewhere classified: Secondary | ICD-10-CM | POA: Diagnosis not present

## 2022-10-09 ENCOUNTER — Other Ambulatory Visit: Payer: Self-pay | Admitting: Internal Medicine

## 2022-10-13 DIAGNOSIS — Z1231 Encounter for screening mammogram for malignant neoplasm of breast: Secondary | ICD-10-CM | POA: Diagnosis not present

## 2022-10-13 DIAGNOSIS — M6281 Muscle weakness (generalized): Secondary | ICD-10-CM | POA: Diagnosis not present

## 2022-10-13 DIAGNOSIS — M25612 Stiffness of left shoulder, not elsewhere classified: Secondary | ICD-10-CM | POA: Diagnosis not present

## 2022-10-13 DIAGNOSIS — S161XXD Strain of muscle, fascia and tendon at neck level, subsequent encounter: Secondary | ICD-10-CM | POA: Diagnosis not present

## 2022-10-31 ENCOUNTER — Encounter: Payer: Self-pay | Admitting: Gastroenterology

## 2022-10-31 ENCOUNTER — Ambulatory Visit (INDEPENDENT_AMBULATORY_CARE_PROVIDER_SITE_OTHER): Payer: Medicare PPO | Admitting: Gastroenterology

## 2022-10-31 VITALS — BP 122/70 | HR 75 | Ht 61.0 in | Wt 177.0 lb

## 2022-10-31 DIAGNOSIS — K219 Gastro-esophageal reflux disease without esophagitis: Secondary | ICD-10-CM

## 2022-10-31 DIAGNOSIS — R140 Abdominal distension (gaseous): Secondary | ICD-10-CM

## 2022-10-31 DIAGNOSIS — R1319 Other dysphagia: Secondary | ICD-10-CM

## 2022-10-31 DIAGNOSIS — R1310 Dysphagia, unspecified: Secondary | ICD-10-CM

## 2022-10-31 NOTE — Progress Notes (Unsigned)
GASTROENTEROLOGY OUTPATIENT CLINIC VISIT   Primary Care Provider Myrlene Broker, MD [address] [place] [address] [phone]  Referring Provider Myrlene Broker, MD [address] [place],  [place] [address] [phone]  Patient Profile: Angela [name] is a 77 y.o. female with a pmh significant for  The patient presents to the [facility] for an evaluation and management of problem(s) noted below:  Problem List No diagnosis found.  History of Present Illness    The patient does/does not take NSAIDs or BC/Goody Powder. Patient has/has not had an EGD. Patient has/has not had a Colonoscopy.  GI Review of Systems Positive as above Negative for  Pyrosis; Reflux; Regurgitation; Dysphagia; Odynophagia; Globus; Post-prandial cough; Nocturnal cough; Nasal regurgitation; Epigastric pain; Nausea; Vomiting; Hematemesis; Jaundice; Change in Appetite; Early satiety; Abdominal pain; Abdominal bloating; Eructation; Flatulence; Change in BM Frequency; Change in BM Consistency; Constipation; Diarrhea; Incontinence; Urgency; Tenesmus; Hematochezia; Melena  Review of Systems General: Denies fevers/chills/weight loss/night sweats HEENT: Denies oral lesions/sore throat/headaches/visual changes Cardiovascular: Denies chest pain/palpitations Pulmonary: Denies shortness of breath/cough Gastroenterological: See HPI Genitourinary: Denies darkened urine or hematuria Hematological: Denies easy bruising/bleeding Endocrine: Denies temperature intolerance Dermatological: Denies skin changes Psychological: Mood is stable Allergy & Immunology: Denies severe allergic reactions Musculoskeletal: Denies new arthralgias   Medications Current Outpatient Medications  Medication Sig Dispense Refill   albuterol (VENTOLIN HFA) 108 (90 Base) MCG/ACT inhaler TAKE 2 PUFFS BY MOUTH EVERY 6 HOURS AS NEEDED FOR WHEEZE OR SHORTNESS OF BREATH 8.5 each 1   aspirin  81 MG tablet Take 81 mg by mouth daily.     cholestyramine (QUESTRAN) 4 g packet TAKE 1 PACKET BY MOUTH DAILY 60 packet 1   ergocalciferol (DRISDOL) 1.25 MG (50000 UT) capsule Take 50,000 Units by mouth daily at 2 am.     esomeprazole (NEXIUM) 40 MG capsule TAKE 1 CAPSULE BY MOUTH 2 TIMES DAILY BEFORE A MEAL. 180 capsule 1   famotidine (PEPCID) 20 MG tablet TAKE 1 TABLET BY MOUTH EVERYDAY AT BEDTIME 90 tablet 1   fluocinonide gel (LIDEX) 0.05 %      hydrocortisone (ANUSOL-HC) 25 MG suppository Place 1 suppository (25 mg total) rectally at bedtime. Use nightly for 3 nights in a row 12 suppository 1   hydrocortisone 2.5 % cream Apply 1 Application topically as needed.     ketoconazole (NIZORAL) 2 % cream Apply 1 application  topically daily as needed for irritation.     latanoprost (XALATAN) 0.005 % ophthalmic solution latanoprost 0.005 % eye drops  PLACE 1 DROP IN BOTH EYES AT BEDTIME     levothyroxine (SYNTHROID) 50 MCG tablet TAKE 0.5 TABLETS (25 MCG TOTAL) BY MOUTH DAILY. 45 tablet 2   meloxicam (MOBIC) 15 MG tablet Take 15 mg by mouth daily.     metFORMIN (GLUCOPHAGE) 500 MG tablet Take by mouth 2 (two) times daily with a meal.     metoprolol tartrate (LOPRESSOR) 50 MG tablet TAKE 1 TABLET BY MOUTH TWICE A DAY 180 tablet 0   metroNIDAZOLE (METROCREAM) 0.75 % cream      mometasone (NASONEX) 50 MCG/ACT nasal spray Place 2 sprays into the nose daily.     PARoxetine (PAXIL) 10 MG tablet Paxil 10 mg tablet  Take 1 tablet every day by oral route.     rosuvastatin (CRESTOR) 20 MG tablet Take 1 tablet (20 mg total) by mouth daily. 90 tablet 3   topiramate (TOPAMAX) 50 MG tablet TAKE 1 TABLET BY MOUTH EVERY DAY  90 tablet 1   traMADol (ULTRAM) 50 MG tablet Take 50 mg by mouth as needed.     triamcinolone cream (KENALOG) 0.1 % Apply 1 application  topically 2 (two) times daily as needed (for breakout).     No current facility-administered medications for this visit.    Allergies Allergies   Allergen Reactions   Shellfish Allergy Anaphylaxis   Tetracycline Other (See Comments)    GI bleed   Effexor Xr [Venlafaxine Hcl] Other (See Comments)    Nightmare and vivid dreams   Codeine Nausea And Vomiting and Other (See Comments)    GI upset/vomit   Iohexol Hives    Per Dr.Grady patient doesn't need a 13 hour prep, only itching - patient already had a rash due to autoimmune condition //rls     Histories Past Medical History:  Diagnosis Date   Acne rosacea    Allergy    Bile salt-induced diarrhea    Cataract    Colon polyps    Degenerative disc disease    Diverticulitis 05/16/2015   Diverticulosis 05/10/2004   Duodenitis 04/25/2005   Esophageal stricture 09/13/2007   Esophagitis 04/25/2005   External hemorrhoids 05/10/2004   Fibromuscular hyperplasia of artery (HCC)    of carotid artery   Fibromyalgia    Gastroparesis    GERD (gastroesophageal reflux disease) 09/13/2007   Glaucoma    Heart murmur    Hiatal hernia 04/25/2005   Hyperlipidemia    Hypertension    Hypothyroidism    IBS (irritable bowel syndrome)    Migraine    Osteoarthritis    Osteoporosis    Other and unspecified noninfectious gastroenteritis and colitis(558.9)    Sessile colonic polyp 05/30/2009   Vertigo    Vitamin D deficiency    Past Surgical History:  Procedure Laterality Date   BUNIONECTOMY  09/04/2011   left   CESAREAN SECTION     CHOLECYSTECTOMY  1985   COLONOSCOPY     FOOT OSTEOTOMY  2006   Right foot   ROTATOR CUFF REPAIR Right 04/19/2020   TUBAL LIGATION  1983   VAGINAL HYSTERECTOMY  1993   Social History   Socioeconomic History   Marital status: Married    Spouse name: Not on file   Number of children: 2   Years of education: Not on file   Highest education level: Not on file  Occupational History   Occupation: Retired  Tobacco Use   Smoking status: Never   Smokeless tobacco: Never  Vaping Use   Vaping status: Never Used  Substance and Sexual Activity    Alcohol use: No   Drug use: No   Sexual activity: Not on file  Other Topics Concern   Not on file  Social History Narrative   Lives at home with husband   Right handed   Caffeine: 2 cups/day, sweet tea   Social Determinants of Health   Financial Resource Strain: Low Risk  (01/27/2022)   Overall Financial Resource Strain (CARDIA)    Difficulty of Paying Living Expenses: Not hard at all  Food Insecurity: No Food Insecurity (01/27/2022)   Hunger Vital Sign    Worried About Radiation protection practitioner of Food in the Last Year: Never true    Ran Out of Food in the Last Year: Never true  Transportation Needs: No Transportation Needs (01/27/2022)   PRAPARE - Administrator, Civil Service (Medical): No    Lack of Transportation (Non-Medical): No  Physical Activity: Insufficiently Active (01/27/2022)   Exercise  Vital Sign    Days of Exercise per Week: 7 days    Minutes of Exercise per Session: 20 min  Stress: No Stress Concern Present (01/27/2022)   [employer] of Occupational Health - Occupational Stress Questionnaire    Feeling of Stress : Not at all  Social Connections: Socially Integrated (01/27/2022)   Social Connection and Isolation Panel [NHANES]    Frequency of Communication with Friends and Family: More than three times a week    Frequency of Social Gatherings with Friends and Family: More than three times a week    Attends Religious Services: More than 4 times per year    Active Member of [employer] or Organizations: Yes    Attends Engineer, structural: More than 4 times per year    Marital Status: Married  Catering manager Violence: Not At Risk (01/27/2022)   Humiliation, Afraid, Rape, and Kick questionnaire    Fear of Current or Ex-Partner: No    Emotionally Abused: No    Physically Abused: No    Sexually Abused: No   Family History  Problem Relation Age of Onset   Mitral valve prolapse Mother    Aneurysm Mother        cerebral   Hypertension Father    Colon  polyps Father    Stroke Father    Hypertension Sister        x2   Diabetes Sister    Colon cancer Maternal Aunt    Stomach cancer Maternal Aunt    Colon polyps Sister    Pancreatic cancer Neg Hx    Migraines Neg Hx    I have reviewed her medical, social, and family history in detail and updated the electronic medical record as necessary.    PHYSICAL EXAMINATION  BP 122/70   Pulse 75   Ht 5\' 1"  (1.549 m)   Wt 177 lb (80.3 kg)   SpO2 97%   BMI 33.44 kg/m  Wt Readings from Last 3 Encounters:  10/31/22 177 lb (80.3 kg)  08/27/22 180 lb (81.6 kg)  07/09/22 179 lb (81.2 kg)   GEN: NAD, appears stated age, doesn't appear chronically ill PSYCH: Cooperative, without pressured speech EYE: Conjunctivae pink, sclerae anicteric ENT: MMM, without oral ulcers, no erythema or exudates noted NECK: Supple CV: RR without R/Gs  RESP: CTAB posteriorly, without wheezing GI: NABS, soft, NT/ND, without rebound or guarding, no HSM appreciated GU: DRE shows MSK/EXT: _ edema, no palmar erythema SKIN: No jaundice, no spider angiomata, no concerning rashes NEURO:  Alert & Oriented x 3, no focal deficits, no evidence of asterixis   REVIEW OF DATA  I reviewed the following data at the time of this encounter:  GI Procedures and Studies  5/24 Flex Sigmoidoscopy - Hemorrhoids found on digital rectal exam. - Diverticulosis in the recto-sigmoid colon, in the sigmoid colon and in the descending colon. - Normal mucosa in the entire visualized colon. - Normal mucosa in the rectum. Biopsied. - Non-bleeding non-thrombosed external and internal hemorrhoids.    Laboratory Studies  ***  Imaging Studies  ***   ASSESSMENT  Ms. Tilson is a 77 y.o. female with a pmh significant for The patient is seen today for evaluation and management of:  No diagnosis found.  ***   PLAN  There are no diagnoses linked to this encounter.   No orders of the defined types were placed in this encounter.   New  Prescriptions   No medications on file   Modified Medications  No medications on file    Planned Follow Up No follow-ups on file.   Total Time in Face-to-Face and in Coordination of Care for patient including independent/personal interpretation/review of prior testing, medical history, examination, medication adjustment, communicating results with the patient directly, and documentation within the EHR is ***.   Corliss Parish, MD [facility] Office # [phone]

## 2022-10-31 NOTE — Patient Instructions (Addendum)
You have been given a testing kit to check for small intestine bacterial overgrowth (SIBO) which is completed by a company named [facility]. Make sure to return your test in the mail using the return mailing label given to you along with the kit. The test order, your demographic and insurance information have all already been sent to the company. [facility] will collect an upfront charge of $99.74 for commercial insurance plans and $209.74 is you are paying cash. Make sure to discuss with [facility] PRIOR to having the test to see if they have gotten information from your insurance company as to how much your testing will cost out of pocket, if any. Please contact [facility] at phone number [phone] to get instructions regarding how to perform the test as our office is unable to give specific testing instructions.  You have been scheduled for an endoscopy. Please follow written instructions given to you at your visit today.  If you use inhalers (even only as needed), please bring them with you on the day of your procedure.  If you take any of the following medications, they will need to be adjusted prior to your procedure:   DO NOT TAKE 7 DAYS PRIOR TO TEST- Trulicity (dulaglutide) Ozempic, Wegovy (semaglutide) Mounjaro (tirzepatide) Bydureon Bcise (exanatide extended release)  DO NOT TAKE 1 DAY PRIOR TO YOUR TEST Rybelsus (semaglutide) Adlyxin (lixisenatide) Victoza (liraglutide) Byetta (exanatide) ___________________________________________________________________________   Call office or send my chart message in 10 days with updates of symptoms. If symptoms are no better, then Dr Meridee Score will consider increasing your PPI to 40 mg twice daily.   _______________________________________________________  If your blood pressure at your visit was 140/90 or greater, please contact your primary care physician to follow up on  this.  _______________________________________________________  If you are age 76 or older, your body mass index should be between 23-30. Your Body mass index is 33.44 kg/m. If this is out of the aforementioned range listed, please consider follow up with your Primary Care Provider.  If you are age 76 or older, your body mass index should be between 19-25. Your Body mass index is 33.44 kg/m. If this is out of the aformentioned range listed, please consider follow up with your Primary Care Provider.   ________________________________________________________  The [facility] providers would like to encourage you to use [facility] to communicate with providers for non-urgent requests or questions.  Due to long hold times on the telephone, sending your provider a message by [facility] may be a faster and more efficient way to get a response.  Please allow 48 business hours for a response.  Please remember that this is for non-urgent requests.  _______________________________________________________  Thank you for choosing me and [facility].  Dr. Meridee Score

## 2022-11-02 ENCOUNTER — Encounter: Payer: Self-pay | Admitting: Gastroenterology

## 2022-11-02 DIAGNOSIS — R140 Abdominal distension (gaseous): Secondary | ICD-10-CM | POA: Insufficient documentation

## 2022-11-02 DIAGNOSIS — R1319 Other dysphagia: Secondary | ICD-10-CM | POA: Insufficient documentation

## 2022-11-05 DIAGNOSIS — M545 Low back pain, unspecified: Secondary | ICD-10-CM | POA: Diagnosis not present

## 2022-11-07 ENCOUNTER — Telehealth: Payer: Self-pay | Admitting: Gastroenterology

## 2022-11-07 NOTE — Telephone Encounter (Signed)
Inbound call from patient requesting to speak with as nurse in regards to preparation for a test. Please advise.

## 2022-11-07 NOTE — Telephone Encounter (Signed)
Left message on machine to call back  

## 2022-11-07 NOTE — Telephone Encounter (Signed)
The pt states she has SIBO to complete and has some questions about completing.  I have asked her to call the company and ask her questions so that she can get accurate answers.  The pt has been advised of the information and verbalized understanding.

## 2022-11-08 DIAGNOSIS — R140 Abdominal distension (gaseous): Secondary | ICD-10-CM | POA: Diagnosis not present

## 2022-11-21 ENCOUNTER — Ambulatory Visit (INDEPENDENT_AMBULATORY_CARE_PROVIDER_SITE_OTHER): Payer: Medicare PPO

## 2022-11-21 ENCOUNTER — Ambulatory Visit
Admission: EM | Admit: 2022-11-21 | Discharge: 2022-11-21 | Disposition: A | Discharge status: Home / Self Care | Payer: Medicare PPO | Source: Home / Self Care

## 2022-11-21 DIAGNOSIS — R051 Acute cough: Secondary | ICD-10-CM

## 2022-11-21 DIAGNOSIS — R509 Fever, unspecified: Secondary | ICD-10-CM | POA: Diagnosis not present

## 2022-11-21 DIAGNOSIS — U071 COVID-19: Secondary | ICD-10-CM | POA: Diagnosis not present

## 2022-11-21 DIAGNOSIS — R059 Cough, unspecified: Secondary | ICD-10-CM | POA: Diagnosis not present

## 2022-11-21 DIAGNOSIS — B349 Viral infection, unspecified: Secondary | ICD-10-CM

## 2022-11-21 LAB — POCT INFLUENZA A/B
Influenza A, POC: NEGATIVE
Influenza B, POC: NEGATIVE

## 2022-11-21 MED ORDER — PROMETHAZINE-DM 6.25-15 MG/5ML PO SYRP
2.5000 mL | ORAL_SOLUTION | Freq: Four times a day (QID) | ORAL | 0 refills | Status: AC | PRN
Start: 2022-11-21 — End: ?

## 2022-11-21 MED ORDER — ACETAMINOPHEN 325 MG PO TABS
650.0000 mg | ORAL_TABLET | Freq: Once | ORAL | Status: AC
  Administered 2022-11-21: 650 mg via ORAL

## 2022-11-21 NOTE — Discharge Instructions (Signed)
Clinic will contact you with results of the COVID test if it is positive.  Your flu testing was negative in the clinic.  You may take for medicine team as needed for cough.  Please have this medication make you drowsy.  Do not drink alcohol or drive while on this medication.  Lots of rest and fluids.  Please follow-up with your PCP if your symptoms are not improving.  Please go to the emergency room if you develop any worsening symptoms.  I hope you feel better soon!

## 2022-11-21 NOTE — ED Provider Notes (Signed)
[facility]    CSN: [id number] Arrival date & time: 11/21/22  3875      History   Chief Complaint Chief Complaint  Patient presents with   Cough    HPI Angela [name] is a 77 y.o. female  presents for evaluation of URI symptoms for 2 days. Patient reports associated symptoms of cough, congestion, body aches. Denies N/V/D, fevers, sore throat, ear pain, shortness of breath. Patient does not have a hx of asthma or smoking.  Patient reports she feels her bodyaches may be attributed to her fibromyalgia.  Has been similar symptoms.  Pt has taken Claritin OTC for symptoms. Pt has no other concerns at this time.    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   Fibromyalgia    Gastroparesis    GERD (gastroesophageal reflux disease) 09/13/2007   Glaucoma    Heart murmur    Hiatal hernia 04/25/2005   Hyperlipidemia    Hypertension    Hypothyroidism    IBS (irritable bowel syndrome)    Migraine    Osteoarthritis    Osteoporosis    Other and unspecified noninfectious gastroenteritis and colitis(558.9)    Sessile colonic polyp 05/30/2009   Vertigo    Vitamin D deficiency     Patient Active Problem List   Diagnosis Date Noted   Esophageal dysphagia 11/02/2022   Bloating 11/02/2022   NASH (nonalcoholic steatohepatitis) 07/10/2022   Impaired fasting blood sugar 01/06/2022   Obesity 06/10/2021   Hot flashes 06/10/2021   Wheezing 12/31/2020   SOB (shortness of breath) on exertion 08/03/2020   Disequilibrium 07/25/2020   Cough 04/12/2019   Stress incontinence 10/23/2017    Bacterial vaginosis 09/21/2017   Atypical squamous cells of undetermined significance (ASC-US) on cervical Pap smear 09/21/2017   Osteoarthritis    IBS (irritable bowel syndrome)    Hypertension    Glaucoma    Gastroparesis    Colon polyps    Bile salt-induced diarrhea    Anxiety    Acne rosacea    Routine general medical examination at a health care facility 03/03/2017   Abnormal CT of the head 11/10/2016   Vertigo 11/09/2016   Allergic rhinitis 02/07/2016   Lichen of skin 09/05/2015   Hemorrhoid 02/13/2015   Numbness and tingling in hands 05/15/2014   Cerebrovascular disease 10/18/2012   Arteriopathy, intracranial, fibromuscular dysplasia, chronic (HCC) 10/18/2012   Fibromuscular hyperplasia of artery (HCC)    Migraine headache 09/24/2011   GERD (gastroesophageal reflux disease) 08/16/2010   Hyperlipidemia 12/20/2009   Hypothyroidism 07/20/2009   Vitamin D deficiency 07/20/2009   Acute sinusitis 12/11/2008   Esophageal stricture 09/13/2007   Essential hypertension 05/14/2007   ACNE ROSACEA 05/14/2007   OSTEOARTHRITIS 05/14/2007   CAROTID BRUIT 05/14/2007   Fibromyalgia 03/30/2007   Hiatal hernia 04/25/2005   External hemorrhoids 05/10/2004    Past Surgical History:  Procedure Laterality Date   BUNIONECTOMY  09/04/2011   left   CESAREAN SECTION     CHOLECYSTECTOMY  1985   COLONOSCOPY     FOOT OSTEOTOMY  2006   Right foot   ROTATOR CUFF REPAIR Right 04/19/2020   TUBAL LIGATION  1983   VAGINAL HYSTERECTOMY  1993    OB History   No obstetric history on file.      Home Medications    Prior to Admission medications   Medication Sig Start Date End Date Taking? Authorizing Provider  promethazine-dextromethorphan (PROMETHAZINE-DM) 6.25-15 MG/5ML syrup Take 2.5 mLs by mouth 4 (four) times daily as needed for cough. 11/21/22  Yes Radford Pax, NP  albuterol (VENTOLIN HFA) 108 (90 Base) MCG/ACT inhaler TAKE 2 PUFFS BY MOUTH EVERY 6 HOURS AS NEEDED FOR WHEEZE OR  SHORTNESS OF BREATH 03/04/22   Myrlene Broker, MD  aspirin 81 MG tablet Take 81 mg by mouth daily.    [provider]  cholestyramine (QUESTRAN) 4 g packet TAKE 1 PACKET BY MOUTH DAILY 09/23/22   Mansouraty, Netty Starring., MD  ergocalciferol (DRISDOL) 1.25 MG (50000 UT) capsule Take 50,000 Units by mouth daily at 2 am. 09/27/12   [provider]  esomeprazole (NEXIUM) 40 MG capsule TAKE 1 CAPSULE BY MOUTH 2 TIMES DAILY BEFORE A MEAL. 05/15/22   Mansouraty, Netty Starring., MD  famotidine (PEPCID) 20 MG tablet TAKE 1 TABLET BY MOUTH EVERYDAY AT BEDTIME 08/18/22   Myrlene Broker, MD  fluocinonide gel (LIDEX) 0.05 %     [provider]  hydrocortisone (ANUSOL-HC) 25 MG suppository Place 1 suppository (25 mg total) rectally at bedtime. Use nightly for 3 nights in a row 08/27/22   Mansouraty, Netty Starring., MD  hydrocortisone 2.5 % cream Apply 1 Application topically as needed. 12/12/15   [provider]  ketoconazole (NIZORAL) 2 % cream Apply 1 application  topically daily as needed for irritation.    [provider]  latanoprost (XALATAN) 0.005 % ophthalmic solution latanoprost 0.005 % eye drops  PLACE 1 DROP IN BOTH EYES AT BEDTIME    [provider]  levothyroxine (SYNTHROID) 50 MCG tablet TAKE 0.5 TABLETS (25 MCG TOTAL) BY MOUTH DAILY. 01/15/22   Myrlene Broker, MD  meloxicam (MOBIC) 15 MG tablet Take 15 mg by mouth daily.    [provider]  metFORMIN (GLUCOPHAGE) 500 MG tablet Take by mouth 2 (two) times daily with a meal.    [provider]  metoprolol tartrate (LOPRESSOR) 50 MG tablet TAKE 1 TABLET BY MOUTH TWICE A DAY 09/26/22   Myrlene Broker, MD  metroNIDAZOLE (METROCREAM) 0.75 % cream     [provider]  mometasone (NASONEX) 50 MCG/ACT nasal spray Place 2 sprays into the nose daily.    [provider]  PARoxetine (PAXIL) 10 MG tablet Paxil 10 mg tablet  Take 1 tablet every day by oral  route.    [provider]  rosuvastatin (CRESTOR) 20 MG tablet Take 1 tablet (20 mg total) by mouth daily. 07/18/22   Myrlene Broker, MD  topiramate (TOPAMAX) 50 MG tablet TAKE 1 TABLET BY MOUTH EVERY DAY 10/09/22   Myrlene Broker, MD  traMADol (ULTRAM) 50 MG tablet Take 50 mg by mouth as needed. 07/16/20   [provider]  triamcinolone cream (KENALOG) 0.1 % Apply 1 application  topically 2 (two) times daily as needed (for breakout).    [provider]    Family History Family History  Problem Relation Age of Onset   Mitral valve prolapse Mother    Aneurysm Mother        cerebral   Hypertension Father  Colon polyps Father    Stroke Father    Hypertension Sister        x2   Diabetes Sister    Colon polyps Sister    Colon cancer Maternal Aunt    Stomach cancer Maternal Aunt    Pancreatic cancer Neg Hx    Migraines Neg Hx    Esophageal cancer Neg Hx    Inflammatory bowel disease Neg Hx    Liver disease Neg Hx    Rectal cancer Neg Hx     Social History Social History   Tobacco Use   Smoking status: Never   Smokeless tobacco: Never  Vaping Use   Vaping status: Never Used  Substance Use Topics   Alcohol use: No   Drug use: No     Allergies   Shellfish allergy, Tetracycline, Effexor xr [venlafaxine hcl], Codeine, and Iohexol   Review of Systems Review of Systems  HENT:  Positive for congestion.   Respiratory:  Positive for cough.   Musculoskeletal:  Positive for myalgias.     Physical Exam Triage Vital Signs ED Triage Vitals  Encounter Vitals Group     BP 11/21/22 1007 120/73     Systolic BP Percentile --      Diastolic BP Percentile --      Pulse Rate 11/21/22 1007 (!) 103     Resp 11/21/22 1007 17     Temp 11/21/22 1007 (!) 100.7 F (38.2 C)     Temp Source 11/21/22 1007 Oral     SpO2 11/21/22 1007 96 %     Weight --      Height --      Head Circumference --      Peak Flow --      Pain Score 11/21/22 1006 5      Pain Loc --      Pain Education --      Exclude from Growth Chart --    No data found.  Updated Vital Signs BP 120/73 (BP Location: Right Arm)   Pulse (!) 103   Temp (!) 100.7 F (38.2 C) (Oral)   Resp 17   SpO2 96%   Visual Acuity Right Eye Distance:   Left Eye Distance:   Bilateral Distance:    Right Eye Near:   Left Eye Near:    Bilateral Near:     Physical Exam Vitals and nursing note reviewed.  Constitutional:      General: She is not in acute distress.    Appearance: She is well-developed. She is not ill-appearing.  HENT:     Head: Normocephalic and atraumatic.     Right Ear: Tympanic membrane and ear canal normal.     Left Ear: Tympanic membrane and ear canal normal.     Nose: Congestion present.     Mouth/Throat:     Mouth: Mucous membranes are moist.     Pharynx: Oropharynx is clear. Uvula midline. No oropharyngeal exudate or posterior oropharyngeal erythema.     Tonsils: No tonsillar exudate or tonsillar abscesses.  Eyes:     Conjunctiva/sclera: Conjunctivae normal.     Pupils: Pupils are equal, round, and reactive to light.  Cardiovascular:     Rate and Rhythm: Normal rate and regular rhythm.     Heart sounds: Normal heart sounds.  Pulmonary:     Effort: Pulmonary effort is normal.     Breath sounds: Normal breath sounds.  Musculoskeletal:     Cervical back: Normal range of motion and neck  supple.  Lymphadenopathy:     Cervical: No cervical adenopathy.  Skin:    General: Skin is warm and dry.  Neurological:     General: No focal deficit present.     Mental Status: She is alert and oriented to person, place, and time.  Psychiatric:        Mood and Affect: Mood normal.        Behavior: Behavior normal.      UC Treatments / Results  Labs (all labs ordered are listed, but only abnormal results are displayed) Labs Reviewed  SARS CORONAVIRUS 2 (TAT 6-24 HRS)  POCT INFLUENZA A/B    EKG   Radiology DG Chest 2 View  Result Date:  11/21/2022 CLINICAL DATA:  Cough, fever EXAM: CHEST - 2 VIEW COMPARISON:  Chest radiograph 01/01/2021 FINDINGS: The cardiomediastinal silhouette is normal There is no focal consolidation or pulmonary edema. There is no pleural effusion or pneumothorax There is no acute osseous abnormality. Cholecystectomy clips are noted. IMPRESSION: No radiographic evidence of acute cardiopulmonary process. Electronically Signed   By: Lesia Hausen M.D.   On: 11/21/2022 11:37    Procedures Procedures (including critical care time)  Medications Ordered in UC Medications  acetaminophen (TYLENOL) tablet 650 mg (650 mg Oral Given 11/21/22 1010)    Initial Impression / Assessment and Plan / UC Course  I have reviewed the triage vital signs and the nursing notes.  Pertinent labs & imaging results that were available during my care of the patient were reviewed by me and considered in my medical decision making (see chart for details).     Reviewed exam and symptoms with patient.  No red flags.  Negative rapid flu.  COVID PCR and will contact if positive.  Chest x-ray negative for pneumonia.  Patient was given Tylenol in clinic for fever.  Discussed viral illness and symptomatic treatment.  Promethazine DM as needed.  Side effect profile reviewed.  Rest and fluids.  PCP follow-up as symptoms do not improve.  ER precautions reviewed and patient verbalized understanding. Final Clinical Impressions(s) / UC Diagnoses   Final diagnoses:  Acute cough  Viral illness     Discharge Instructions      Clinic will contact you with results of the COVID test if it is positive.  Your flu testing was negative in the clinic.  You may take for medicine team as needed for cough.  Please have this medication make you drowsy.  Do not drink alcohol or drive while on this medication.  Lots of rest and fluids.  Please follow-up with your PCP if your symptoms are not improving.  Please go to the emergency room if you develop any worsening  symptoms.  I hope you feel better soon!     ED Prescriptions     Medication Sig Dispense Auth. Provider   promethazine-dextromethorphan (PROMETHAZINE-DM) 6.25-15 MG/5ML syrup Take 2.5 mLs by mouth 4 (four) times daily as needed for cough. 118 mL Radford Pax, NP      PDMP not reviewed this encounter.   Radford Pax, NP 11/21/22 1151

## 2022-11-21 NOTE — ED Triage Notes (Signed)
Pt presents with a cough and congestion X 2 days.  States she has had rt shoulder aching and nausea.   Reports she has taken Claritin.

## 2022-11-22 ENCOUNTER — Telehealth: Payer: Self-pay | Admitting: Nurse Practitioner

## 2022-11-22 ENCOUNTER — Telehealth: Payer: Self-pay

## 2022-11-22 DIAGNOSIS — U071 COVID-19: Secondary | ICD-10-CM

## 2022-11-22 LAB — SARS CORONAVIRUS 2 (TAT 6-24 HRS): SARS Coronavirus 2: POSITIVE — AB

## 2022-11-22 MED ORDER — PAXLOVID (300/100) 20 X 150 MG & 10 X 100MG PO TBPK
3.0000 | ORAL_TABLET | Freq: Two times a day (BID) | ORAL | 0 refills | Status: AC
Start: 2022-11-22 — End: 2022-11-27

## 2022-11-22 NOTE — Telephone Encounter (Signed)
Pt called requesting to have paxlovid prescribed. Pt states her covid results came back positive. Medical Provider in office today notified.

## 2022-11-22 NOTE — Telephone Encounter (Signed)
Patient seen in clinic on 8/9 for COVID symptoms.  COVID PCR positive.  Today is day 3 of symptoms.  Patient requesting Paxlovid.  Reviewed labs from March 2024 showing GFR 63 and normal LFTs.  Reviewed medications/interactions with Paxlovid.  Patient is to not take any tramadol while on the Paxlovid and she is to cut her Crestor dose in half for 10 days.  Rx was sent to pharmacy.  Patient is to follow-up with PCP in 2 days for recheck.  ER precautions reviewed.  Nursing staff to inform patient of the above information.

## 2022-11-24 ENCOUNTER — Ambulatory Visit: Payer: Medicare PPO | Admitting: Internal Medicine

## 2022-11-29 ENCOUNTER — Encounter: Payer: Self-pay | Admitting: Gastroenterology

## 2022-11-29 NOTE — Progress Notes (Signed)
[facility] SIBO breath testing results  Hydrogen 22 bpm Methane to PPM Increase in hydrogen and methane 24 ppm Bacterial overgrowth is suspected  We will update the patient with results and scanned them into the chart and send a copy to her. Patient is recommended Xifaxan 550 mg 3 times daily x 14 days, and effort of trying to treat her SIBO.  Corliss Parish, MD [facility] Office # [phone]

## 2022-11-30 ENCOUNTER — Other Ambulatory Visit: Payer: Self-pay | Admitting: Internal Medicine

## 2022-12-01 ENCOUNTER — Other Ambulatory Visit: Payer: Self-pay

## 2022-12-01 MED ORDER — RIFAXIMIN 550 MG PO TABS: 550.0000 mg | ORAL_TABLET | Freq: Three times a day (TID) | ORAL | 0 refills | Status: AC

## 2022-12-01 NOTE — Progress Notes (Signed)
The pt has been advised of the results and agrees to the xifaxan - she is aware that this will need a prior auth and will get a response as soon as able. She will let us know if she has not heard anything in a few days.

## 2022-12-01 NOTE — Progress Notes (Signed)
Prescription has been sent to the pharmacy  Left message on machine to call back

## 2022-12-02 ENCOUNTER — Telehealth: Payer: Self-pay

## 2022-12-02 NOTE — Telephone Encounter (Signed)
*  Gastro  Pharmacy Patient Advocate Encounter   Received notification from CoverMyMeds that prior authorization for Xifaxan 550MG  tablets  is required/requested.   Insurance verification completed.   The patient is insured through [employer] .   Per test claim: PA required; PA submitted to [facility] via CoverMyMeds Key/confirmation #/EOC [id number] Status is pending

## 2022-12-08 DIAGNOSIS — M545 Low back pain, unspecified: Secondary | ICD-10-CM | POA: Diagnosis not present

## 2022-12-09 ENCOUNTER — Other Ambulatory Visit (HOSPITAL_COMMUNITY): Payer: Self-pay

## 2022-12-09 ENCOUNTER — Ambulatory Visit: Payer: Medicare PPO | Admitting: Internal Medicine

## 2022-12-09 NOTE — Telephone Encounter (Signed)
The pt has been advised of the insurance approval for xifaxan

## 2022-12-09 NOTE — Telephone Encounter (Signed)
Pharmacy Patient Advocate Encounter  Received notification from [facility] that Prior Authorization for Xifaxan 550MG  tablets has been APPROVED from 12-02-2022 to 04-14-2023. Ran test claim, Copay is $100.00 Quantity 42 tablets per 14 days. This test claim was processed through [facility]- copay amounts may vary at other pharmacies due to pharmacy/plan contracts, or as the patient moves through the different stages of their insurance plan.   PA #/Case ID/Reference #: [id number]

## 2022-12-12 ENCOUNTER — Encounter: Payer: Self-pay | Admitting: Gastroenterology

## 2022-12-12 ENCOUNTER — Other Ambulatory Visit: Payer: Self-pay | Admitting: Internal Medicine

## 2022-12-12 NOTE — Telephone Encounter (Signed)
PT is calling because the Xifaxin still has not been sent to [facility] [address]

## 2022-12-12 NOTE — Telephone Encounter (Signed)
Noted  

## 2022-12-12 NOTE — Telephone Encounter (Signed)
Patient stated pharmacy advised her the medication is out of stock and will have more in hopefully the next 2 days.

## 2022-12-23 ENCOUNTER — Ambulatory Visit: Payer: Medicare PPO | Admitting: Internal Medicine

## 2022-12-23 VITALS — BP 130/80 | HR 68 | Temp 98.7°F | Ht 61.0 in | Wt 174.0 lb

## 2022-12-23 DIAGNOSIS — M797 Fibromyalgia: Secondary | ICD-10-CM

## 2022-12-23 MED ORDER — TRAMADOL HCL 50 MG PO TABS: 50.0000 mg | ORAL_TABLET | Freq: Two times a day (BID) | ORAL | 3 refills | Status: AC | PRN

## 2022-12-23 MED ORDER — LORATADINE 10 MG PO TABS: 10.0000 mg | ORAL_TABLET | Freq: Every day | ORAL | 3 refills | Status: DC

## 2022-12-23 NOTE — Progress Notes (Signed)
Subjective:   Patient ID: Angela [name], female    DOB: [birth year], 77 y.o.   MRN: [id number]  HPI The patient is a 77 YO female coming in for arthritis and body aches related to her fibromyalgia. She is struggling to move and when she is not able to move she hurts more.   Review of Systems  Constitutional: Negative.   HENT: Negative.    Eyes: Negative.   Respiratory:  Negative for cough, chest tightness and shortness of breath.   Cardiovascular:  Negative for chest pain, palpitations and leg swelling.  Gastrointestinal:  Negative for abdominal distention, abdominal pain, constipation, diarrhea, nausea and vomiting.  Musculoskeletal:  Positive for arthralgias, back pain and myalgias.  Skin: Negative.   Neurological: Negative.   Psychiatric/Behavioral: Negative.      Objective:  Physical Exam Constitutional:      Appearance: She is well-developed.  HENT:     Head: Normocephalic and atraumatic.  Cardiovascular:     Rate and Rhythm: Normal rate and regular rhythm.  Pulmonary:     Effort: Pulmonary effort is normal. No respiratory distress.     Breath sounds: Normal breath sounds. No wheezing or rales.  Abdominal:     General: Bowel sounds are normal. There is no distension.     Palpations: Abdomen is soft.     Tenderness: There is no abdominal tenderness. There is no rebound.  Musculoskeletal:        General: Tenderness present.     Cervical back: Normal range of motion.  Skin:    General: Skin is warm and dry.  Neurological:     Mental Status: She is alert and oriented to person, place, and time.     Coordination: Coordination normal.     Vitals:   12/23/22 0851  BP: 130/80  Pulse: 68  Temp: 98.7 F (37.1 C)  TempSrc: Oral  SpO2: 98%  Weight: 174 lb (78.9 kg)  Height: 5\' 1"  (1.549 m)    Assessment & Plan:

## 2022-12-23 NOTE — Patient Instructions (Addendum)
We will have you stop taking metformin.  We have sent in the tramadol to use up to twice a day.

## 2022-12-26 ENCOUNTER — Encounter: Payer: Self-pay | Admitting: Internal Medicine

## 2022-12-26 NOTE — Assessment & Plan Note (Signed)
With worsening flare lately. Will try tramadol 50 mg up to BID and rx done today. If effective can be long term. She wishes to be more active to have better QOL. Discussed risk and benefit and reviewed [employer] database.

## 2022-12-30 DIAGNOSIS — H401131 Primary open-angle glaucoma, bilateral, mild stage: Secondary | ICD-10-CM | POA: Diagnosis not present

## 2022-12-30 DIAGNOSIS — H26493 Other secondary cataract, bilateral: Secondary | ICD-10-CM | POA: Diagnosis not present

## 2022-12-30 LAB — OPHTHALMOLOGY REPORT-SCANNED

## 2023-01-06 ENCOUNTER — Encounter: Payer: Self-pay | Admitting: Gastroenterology

## 2023-01-06 ENCOUNTER — Ambulatory Visit: Payer: Medicare PPO | Admitting: Gastroenterology

## 2023-01-06 VITALS — BP 112/60 | HR 67 | Temp 97.4°F | Resp 18 | Ht 61.0 in | Wt 174.0 lb

## 2023-01-06 DIAGNOSIS — K222 Esophageal obstruction: Secondary | ICD-10-CM | POA: Diagnosis not present

## 2023-01-06 DIAGNOSIS — R1310 Dysphagia, unspecified: Secondary | ICD-10-CM | POA: Diagnosis not present

## 2023-01-06 DIAGNOSIS — K2950 Unspecified chronic gastritis without bleeding: Secondary | ICD-10-CM | POA: Diagnosis not present

## 2023-01-06 DIAGNOSIS — K219 Gastro-esophageal reflux disease without esophagitis: Secondary | ICD-10-CM | POA: Diagnosis not present

## 2023-01-06 DIAGNOSIS — K229 Disease of esophagus, unspecified: Secondary | ICD-10-CM | POA: Diagnosis not present

## 2023-01-06 DIAGNOSIS — I10 Essential (primary) hypertension: Secondary | ICD-10-CM | POA: Diagnosis not present

## 2023-01-06 MED ORDER — SODIUM CHLORIDE 0.9 % IV SOLN: 500.0000 mL | Freq: Once | INTRAVENOUS | Status: DC

## 2023-01-06 MED ORDER — SUCRALFATE 1 G PO TABS: 1.0000 g | ORAL_TABLET | Freq: Two times a day (BID) | ORAL | 0 refills | Status: DC

## 2023-01-06 NOTE — Progress Notes (Signed)
Called to room to assist during endoscopic procedure.  Patient ID and intended procedure confirmed with present staff. Received instructions for my participation in the procedure from the performing physician.  

## 2023-01-06 NOTE — Op Note (Signed)
[facility] Patient Name: Angela [name] Procedure Date: 01/06/2023 9:43 AM MRN: [id number] Endoscopist: Corliss Parish , MD, [id number] Age: 77 Referring MD:  Date of Birth: April 20, 1945 Gender: Female Account #: [id number] Procedure:                Upper GI endoscopy Indications:              Epigastric abdominal pain, Dysphagia, Exclusion of                            hiatal hernia, Abdominal bloating Medicines:                Monitored Anesthesia Care Procedure:                Pre-Anesthesia Assessment:                           - Prior to the procedure, a History and Physical                            was performed, and patient medications and                            allergies were reviewed. The patient's tolerance of                            previous anesthesia was also reviewed. The risks                            and benefits of the procedure and the sedation                            options and risks were discussed with the patient.                            All questions were answered, and informed consent                            was obtained. Prior Anticoagulants: The patient has                            taken no anticoagulant or antiplatelet agents                            except for aspirin and has taken no anticoagulant                            or antiplatelet agents except for NSAID medication.                            ASA Grade Assessment: III - A patient with severe                            systemic disease. After reviewing the risks and  benefits, the patient was deemed in satisfactory                            condition to undergo the procedure.                           After obtaining informed consent, the endoscope was                            passed under direct vision. Throughout the                            procedure, the patient's blood pressure, pulse, and                            oxygen  saturations were monitored continuously. The                            GIF [id number] #[id number] was introduced through the                            mouth, and advanced to the second part of duodenum.                            The upper GI endoscopy was accomplished without                            difficulty. The patient tolerated the procedure. Scope In: Scope Out: Findings:                 No gross lesions were noted in the entire                            esophagus. Biopsies were taken with a cold forceps                            for histology to rule out EOE/LOE. After the rest                            of the EGD was completed, a guidewire was placed                            and the scope was withdrawn. Dilation was performed                            with a Savary dilator with no resistance at 18 mm.                            The dilation site was examined following endoscope                            reinsertion and showed no change.  A non-obstructing Schatzki ring was found at the                            gastroesophageal junction. Disruption biopsies were                            taken with a cold forceps circumferentially.                           A 2 cm hiatal hernia was present.                           Patchy mildly erythematous mucosa without bleeding                            was found in the entire examined stomach. Biopsies                            were taken with a cold forceps for histology and                            Helicobacter pylori testing.                           No gross lesions were noted in the duodenal bulb,                            in the first portion of the duodenum and in the                            second portion of the duodenum. Biopsies were taken                            with a cold forceps for histology.                           The major papilla was normal. Complications:            No  immediate complications. Estimated Blood Loss:     Estimated blood loss was minimal. Impression:               - No gross lesions in the entire esophagus.                            Biopsied. Dilated with 18 mm savory.                           - Non-obstructing Schatzki ring. Disrupted.                           - 2 cm hiatal hernia.                           - Erythematous mucosa in the stomach. Biopsied.                           -  No gross lesions in the duodenal bulb, in the                            first portion of the duodenum and in the second                            portion of the duodenum. Biopsied.                           - Normal major papilla. Recommendation:           - The patient will be observed post-procedure,                            until all discharge criteria are met.                           - Discharge patient to home.                           - Patient has a contact number available for                            emergencies. The signs and symptoms of potential                            delayed complications were discussed with the                            patient. Return to normal activities tomorrow.                            Written discharge instructions were provided to the                            patient.                           - Dilation diet as per protocol.                           - Please use Cepacol or Halls Lozenges +/-                            Chloraseptic spray for next 72-96 hours to aid in                            sore thoat should you experience this.                           - Continue present medications.                           - Carafate liquid or slurry BID for 2-weeks.                           -  Continue present medications.                           - Await pathology results.                           - Repeat upper endoscopy PRN for retreatment if                            dysphagia symptoms improve and  then recur in future.                           - The findings and recommendations were discussed                            with the patient.                           - The findings and recommendations were discussed                            with the patient's family. Corliss Parish, MD 01/06/2023 10:09:27 AM

## 2023-01-06 NOTE — Patient Instructions (Addendum)
Resume previous diet tomorrow, today follow post dilation diet instructions Continue present medications, begin taking Carafate(Sucralfate) twice daily before meals for 2 weeks Await pathology results Use Cepacol or halls lozenges or chloroseptic spray for sore throat over the next few days if necessary  Handouts/information given for hiatal hernia, Schatzki Ring, gastritis, esophageal dilation, and GERD  YOU HAD AN ENDOSCOPIC PROCEDURE TODAY AT THE [facility]:   Refer to the procedure report that was given to you for any specific questions about what was found during the examination.  If the procedure report does not answer your questions, please call your gastroenterologist to clarify.  If you requested that your care partner not be given the details of your procedure findings, then the procedure report has been included in a sealed envelope for you to review at your convenience later.  YOU SHOULD EXPECT: Some feelings of bloating in the abdomen. Passage of more gas than usual.  Walking can help get rid of the air that was put into your GI tract during the procedure and reduce the bloating. If you had a lower endoscopy (such as a colonoscopy or flexible sigmoidoscopy) you may notice spotting of blood in your stool or on the toilet paper. If you underwent a bowel prep for your procedure, you may not have a normal bowel movement for a few days.  Please Note:  You might notice some irritation and congestion in your nose or some drainage.  This is from the oxygen used during your procedure.  There is no need for concern and it should clear up in a day or so.  SYMPTOMS TO REPORT IMMEDIATELY:  Following upper endoscopy (EGD)  Vomiting of blood or coffee ground material  New chest pain or pain under the shoulder blades  Painful or persistently difficult swallowing  New shortness of breath  Fever of 100F or higher  Black, tarry-looking stools  For urgent or emergent issues, a  gastroenterologist can be reached at any hour by calling [phone]. Do not use MyChart messaging for urgent concerns.   DIET:  see post dilation diet handout, nothing by mouth for one hour, then clear liquids for one hour, then soft diet the rest of today,  but then tomorrow you may proceed to your regular diet.  Drink plenty of fluids but you should avoid alcoholic beverages for 24 hours.  ACTIVITY:  You should plan to take it easy for the rest of today and you should NOT DRIVE or use heavy machinery until tomorrow (because of the sedation medicines used during the test).    FOLLOW UP: Our staff will call the number listed on your records the next business day following your procedure.  We will call around 7:15- 8:00 am to check on you and address any questions or concerns that you may have regarding the information given to you following your procedure. If we do not reach you, we will leave a message.     If any biopsies were taken you will be contacted by phone or by letter within the next 1-3 weeks.  Please call us at [phone] if you have not heard about the biopsies in 3 weeks.   SIGNATURES/CONFIDENTIALITY: You and/or your care partner have signed paperwork which will be entered into your electronic medical record.  These signatures attest to the fact that that the information above on your After Visit Summary has been reviewed and is understood.  Full responsibility of the confidentiality of this discharge information lies with  you and/or your care-partner.

## 2023-01-06 NOTE — Progress Notes (Signed)
Pt's states no medical or surgical changes since previsit or office visit. 

## 2023-01-06 NOTE — Progress Notes (Signed)
Report to PACU, RN, vss, BBS= Clear.  

## 2023-01-06 NOTE — Progress Notes (Signed)
GASTROENTEROLOGY PROCEDURE H&P NOTE   Primary Care Physician: Myrlene Broker, MD  HPI: Angela [name] is a 77 y.o. female who presents for EGD for further evaluation of bloating and GERD and dysphagia.  Past Medical History:  Diagnosis Date   Acne rosacea    Allergy    Bile salt-induced diarrhea    Cataract    Colon polyps    Degenerative disc disease    Diverticulitis 05/16/2015   Diverticulosis 05/10/2004   Duodenitis 04/25/2005   Esophageal stricture 09/13/2007   Esophagitis 04/25/2005   External hemorrhoids 05/10/2004   Fibromuscular hyperplasia of artery (HCC)    of carotid artery   Fibromyalgia    Gastroparesis    GERD (gastroesophageal reflux disease) 09/13/2007   Glaucoma    Heart murmur    Hiatal hernia 04/25/2005   Hyperlipidemia    Hypertension    Hypothyroidism    IBS (irritable bowel syndrome)    Migraine    Osteoarthritis    Osteoporosis    Other and unspecified noninfectious gastroenteritis and colitis(558.9)    Sessile colonic polyp 05/30/2009   Vertigo    Vitamin D deficiency    Past Surgical History:  Procedure Laterality Date   BUNIONECTOMY  09/04/2011   left   CESAREAN SECTION     CHOLECYSTECTOMY  1985   COLONOSCOPY     FOOT OSTEOTOMY  2006   Right foot   ROTATOR CUFF REPAIR Right 04/19/2020   TUBAL LIGATION  1983   VAGINAL HYSTERECTOMY  1993   Current Outpatient Medications  Medication Sig Dispense Refill   albuterol (VENTOLIN HFA) 108 (90 Base) MCG/ACT inhaler TAKE 2 PUFFS BY MOUTH EVERY 6 HOURS AS NEEDED FOR WHEEZE OR SHORTNESS OF BREATH 8.5 each 1   aspirin 81 MG tablet Take 81 mg by mouth daily.     cholestyramine (QUESTRAN) 4 g packet TAKE 1 PACKET BY MOUTH DAILY 60 packet 1   ergocalciferol (DRISDOL) 1.25 MG (50000 UT) capsule Take 50,000 Units by mouth daily at 2 am.     esomeprazole (NEXIUM) 40 MG capsule TAKE 1 CAPSULE BY MOUTH 2 TIMES DAILY BEFORE A MEAL. 180 capsule 1   famotidine (PEPCID) 20 MG tablet TAKE 1  TABLET BY MOUTH EVERYDAY AT BEDTIME 90 tablet 1   fluocinonide gel (LIDEX) 0.05 %      hydrocortisone (ANUSOL-HC) 25 MG suppository Place 1 suppository (25 mg total) rectally at bedtime. Use nightly for 3 nights in a row 12 suppository 1   hydrocortisone 2.5 % cream Apply 1 Application topically as needed.     ketoconazole (NIZORAL) 2 % cream Apply 1 application  topically daily as needed for irritation.     latanoprost (XALATAN) 0.005 % ophthalmic solution latanoprost 0.005 % eye drops  PLACE 1 DROP IN BOTH EYES AT BEDTIME     levothyroxine (SYNTHROID) 50 MCG tablet TAKE 0.5 TABLETS (25 MCG TOTAL) BY MOUTH DAILY. 45 tablet 2   loratadine (CLARITIN) 10 MG tablet Take 1 tablet (10 mg total) by mouth daily. 90 tablet 3   meloxicam (MOBIC) 15 MG tablet Take 15 mg by mouth daily.     metoprolol tartrate (LOPRESSOR) 50 MG tablet TAKE 1 TABLET BY MOUTH TWICE A DAY 180 tablet 0   metroNIDAZOLE (METROCREAM) 0.75 % cream      mometasone (NASONEX) 50 MCG/ACT nasal spray Place 2 sprays into the nose daily.     PARoxetine (PAXIL) 10 MG tablet Paxil 10 mg tablet  Take 1 tablet every day  by oral route.     promethazine-dextromethorphan (PROMETHAZINE-DM) 6.25-15 MG/5ML syrup Take 2.5 mLs by mouth 4 (four) times daily as needed for cough. 118 mL 0   rosuvastatin (CRESTOR) 20 MG tablet Take 1 tablet (20 mg total) by mouth daily. 90 tablet 3   topiramate (TOPAMAX) 50 MG tablet TAKE 1 TABLET BY MOUTH EVERY DAY 90 tablet 1   traMADol (ULTRAM) 50 MG tablet Take 1 tablet (50 mg total) by mouth every 12 (twelve) hours as needed. 60 tablet 3   triamcinolone cream (KENALOG) 0.1 % Apply 1 application  topically 2 (two) times daily as needed (for breakout).     No current facility-administered medications for this visit.    Current Outpatient Medications:    albuterol (VENTOLIN HFA) 108 (90 Base) MCG/ACT inhaler, TAKE 2 PUFFS BY MOUTH EVERY 6 HOURS AS NEEDED FOR WHEEZE OR SHORTNESS OF BREATH, Disp: 8.5 each, Rfl: 1    aspirin 81 MG tablet, Take 81 mg by mouth daily., Disp: , Rfl:    cholestyramine (QUESTRAN) 4 g packet, TAKE 1 PACKET BY MOUTH DAILY, Disp: 60 packet, Rfl: 1   ergocalciferol (DRISDOL) 1.25 MG (50000 UT) capsule, Take 50,000 Units by mouth daily at 2 am., Disp: , Rfl:    esomeprazole (NEXIUM) 40 MG capsule, TAKE 1 CAPSULE BY MOUTH 2 TIMES DAILY BEFORE A MEAL., Disp: 180 capsule, Rfl: 1   famotidine (PEPCID) 20 MG tablet, TAKE 1 TABLET BY MOUTH EVERYDAY AT BEDTIME, Disp: 90 tablet, Rfl: 1   fluocinonide gel (LIDEX) 0.05 %, , Disp: , Rfl:    hydrocortisone (ANUSOL-HC) 25 MG suppository, Place 1 suppository (25 mg total) rectally at bedtime. Use nightly for 3 nights in a row, Disp: 12 suppository, Rfl: 1   hydrocortisone 2.5 % cream, Apply 1 Application topically as needed., Disp: , Rfl:    ketoconazole (NIZORAL) 2 % cream, Apply 1 application  topically daily as needed for irritation., Disp: , Rfl:    latanoprost (XALATAN) 0.005 % ophthalmic solution, latanoprost 0.005 % eye drops  PLACE 1 DROP IN BOTH EYES AT BEDTIME, Disp: , Rfl:    levothyroxine (SYNTHROID) 50 MCG tablet, TAKE 0.5 TABLETS (25 MCG TOTAL) BY MOUTH DAILY., Disp: 45 tablet, Rfl: 2   loratadine (CLARITIN) 10 MG tablet, Take 1 tablet (10 mg total) by mouth daily., Disp: 90 tablet, Rfl: 3   meloxicam (MOBIC) 15 MG tablet, Take 15 mg by mouth daily., Disp: , Rfl:    metoprolol tartrate (LOPRESSOR) 50 MG tablet, TAKE 1 TABLET BY MOUTH TWICE A DAY, Disp: 180 tablet, Rfl: 0   metroNIDAZOLE (METROCREAM) 0.75 % cream, , Disp: , Rfl:    mometasone (NASONEX) 50 MCG/ACT nasal spray, Place 2 sprays into the nose daily., Disp: , Rfl:    PARoxetine (PAXIL) 10 MG tablet, Paxil 10 mg tablet  Take 1 tablet every day by oral route., Disp: , Rfl:    promethazine-dextromethorphan (PROMETHAZINE-DM) 6.25-15 MG/5ML syrup, Take 2.5 mLs by mouth 4 (four) times daily as needed for cough., Disp: 118 mL, Rfl: 0   rosuvastatin (CRESTOR) 20 MG tablet, Take 1 tablet  (20 mg total) by mouth daily., Disp: 90 tablet, Rfl: 3   topiramate (TOPAMAX) 50 MG tablet, TAKE 1 TABLET BY MOUTH EVERY DAY, Disp: 90 tablet, Rfl: 1   traMADol (ULTRAM) 50 MG tablet, Take 1 tablet (50 mg total) by mouth every 12 (twelve) hours as needed., Disp: 60 tablet, Rfl: 3   triamcinolone cream (KENALOG) 0.1 %, Apply 1 application  topically  2 (two) times daily as needed (for breakout)., Disp: , Rfl:  Allergies  Allergen Reactions   Shellfish Allergy Anaphylaxis   Tetracycline Other (See Comments)    GI bleed   Effexor Xr [Venlafaxine Hcl] Other (See Comments)    Nightmare and vivid dreams   Codeine Nausea And Vomiting and Other (See Comments)    GI upset/vomit   Iohexol Hives    Per Dr.Grady patient doesn't need a 13 hour prep, only itching - patient already had a rash due to autoimmune condition //rls    Family History  Problem Relation Age of Onset   Mitral valve prolapse Mother    Aneurysm Mother        cerebral   Hypertension Father    Colon polyps Father    Stroke Father    Hypertension Sister        x2   Diabetes Sister    Colon polyps Sister    Colon cancer Maternal Aunt    Stomach cancer Maternal Aunt    Pancreatic cancer Neg Hx    Migraines Neg Hx    Esophageal cancer Neg Hx    Inflammatory bowel disease Neg Hx    Liver disease Neg Hx    Rectal cancer Neg Hx    Social History   Socioeconomic History   Marital status: Married    Spouse name: Not on file   Number of children: 2   Years of education: Not on file   Highest education level: Not on file  Occupational History   Occupation: Retired  Tobacco Use   Smoking status: Never   Smokeless tobacco: Never  Vaping Use   Vaping status: Never Used  Substance and Sexual Activity   Alcohol use: No   Drug use: No   Sexual activity: Not on file  Other Topics Concern   Not on file  Social History Narrative   Lives at home with husband   Right handed   Caffeine: 2 cups/day, sweet tea   Social  Determinants of Health   Financial Resource Strain: Low Risk  (01/27/2022)   Overall Financial Resource Strain (CARDIA)    Difficulty of Paying Living Expenses: Not hard at all  Food Insecurity: No Food Insecurity (01/27/2022)   Hunger Vital Sign    Worried About Running Out of Food in the Last Year: Never true    Ran Out of Food in the Last Year: Never true  Transportation Needs: No Transportation Needs (01/27/2022)   PRAPARE - Administrator, Civil Service (Medical): No    Lack of Transportation (Non-Medical): No  Physical Activity: Insufficiently Active (01/27/2022)   Exercise Vital Sign    Days of Exercise per Week: 7 days    Minutes of Exercise per Session: 20 min  Stress: No Stress Concern Present (01/27/2022)   [employer] of Occupational Health - Occupational Stress Questionnaire    Feeling of Stress : Not at all  Social Connections: Socially Integrated (01/27/2022)   Social Connection and Isolation Panel [NHANES]    Frequency of Communication with Friends and Family: More than three times a week    Frequency of Social Gatherings with Friends and Family: More than three times a week    Attends Religious Services: More than 4 times per year    Active Member of [employer] or Organizations: Yes    Attends Engineer, structural: More than 4 times per year    Marital Status: Married  Catering manager Violence: Not At Risk (  01/27/2022)   Humiliation, Afraid, Rape, and Kick questionnaire    Fear of Current or Ex-Partner: No    Emotionally Abused: No    Physically Abused: No    Sexually Abused: No    Physical Exam: There were no vitals filed for this visit. There is no height or weight on file to calculate BMI. GEN: NAD EYE: Sclerae anicteric ENT: MMM CV: Non-tachycardic GI: Soft, NT/ND NEURO:  Alert & Oriented x 3  Lab Results: No results for input(s): "WBC", "HGB", "HCT", "PLT" in the last 72 hours. BMET No results for input(s): "NA", "K", "CL",  "CO2", "GLUCOSE", "BUN", "CREATININE", "CALCIUM" in the last 72 hours. LFT No results for input(s): "PROT", "ALBUMIN", "AST", "ALT", "ALKPHOS", "BILITOT", "BILIDIR", "IBILI" in the last 72 hours. PT/INR No results for input(s): "LABPROT", "INR" in the last 72 hours.   Impression / Plan: This is a 77 y.o.female who presents for EGD for further evaluation of bloating and GERD and dysphagia.  The risks and benefits of endoscopic evaluation/treatment were discussed with the patient and/or family; these include but are not limited to the risk of perforation, infection, bleeding, missed lesions, lack of diagnosis, severe illness requiring hospitalization, as well as anesthesia and sedation related illnesses.  The patient's history has been reviewed, patient examined, no change in status, and deemed stable for procedure.  The patient and/or family is agreeable to proceed.    Corliss Parish, MD [facility]oscopy Office # [phone]

## 2023-01-07 ENCOUNTER — Telehealth: Payer: Self-pay

## 2023-01-07 DIAGNOSIS — M79672 Pain in left foot: Secondary | ICD-10-CM | POA: Diagnosis not present

## 2023-01-07 NOTE — Telephone Encounter (Signed)
  Follow up Call-     01/06/2023    9:20 AM 08/27/2022   11:13 AM  Call back number  Post procedure Call Back phone  # [phone]  Permission to leave phone message Yes Yes     Patient questions:  Do you have a fever, pain , or abdominal swelling? No. Pain Score  0 *  Have you tolerated food without any problems? Yes.    Have you been able to return to your normal activities? Yes.    Do you have any questions about your discharge instructions: Diet   No. Medications  No. Follow up visit  No.  Do you have questions or concerns about your Care? No.  Actions: * If pain score is 4 or above: No action needed, pain <4.

## 2023-01-08 LAB — SURGICAL PATHOLOGY

## 2023-01-09 ENCOUNTER — Encounter: Payer: Self-pay | Admitting: Gastroenterology

## 2023-01-29 ENCOUNTER — Ambulatory Visit: Payer: Medicare PPO

## 2023-01-29 VITALS — Ht 61.0 in | Wt 168.0 lb

## 2023-01-29 DIAGNOSIS — Z0000 Encounter for general adult medical examination without abnormal findings: Secondary | ICD-10-CM | POA: Diagnosis not present

## 2023-01-29 NOTE — Progress Notes (Signed)
Subjective:   Angela [name] is a 77 y.o. female who presents for Medicare Annual (Subsequent) preventive examination.  Visit Complete: Virtual I connected with  Angela [name] on 01/29/23 by a audio enabled telemedicine application and verified that I am speaking with the correct person using two identifiers.  Patient Location: Home  Provider Location: Office/Clinic  I discussed the limitations of evaluation and management by telemedicine. The patient expressed understanding and agreed to proceed.  Vital Signs: Because this visit was a virtual/telehealth visit, some criteria may be missing or patient reported. Any vitals not documented were not able to be obtained and vitals that have been documented are patient reported.  Cardiac Risk Factors include: advanced age (>85men, >72 women);dyslipidemia;family history of premature cardiovascular disease;hypertension;obesity (BMI >30kg/m2)     Objective:    Today's Vitals   01/29/23 1132  Weight: 168 lb (76.2 kg)  Height: 5\' 1"  (1.549 m)  PainSc: 2   PainLoc: Generalized   Body mass index is 31.74 kg/m.     01/29/2023   11:34 AM 02/11/2022   12:55 PM 01/27/2022   11:35 AM 08/01/2020   11:38 AM 08/01/2019   11:49 AM 07/30/2018   10:39 AM 07/23/2017   11:42 AM  Advanced Directives  Does Patient Have a Medical Advance Directive? No No No No No No No  Would patient like information on creating a medical advance directive? No - Patient declined  No - Patient declined No - Patient declined Yes (ED - Information included in AVS) No - Patient declined No - Patient declined    Current Medications (verified) Outpatient Encounter Medications as of 01/29/2023  Medication Sig   albuterol (VENTOLIN HFA) 108 (90 Base) MCG/ACT inhaler TAKE 2 PUFFS BY MOUTH EVERY 6 HOURS AS NEEDED FOR WHEEZE OR SHORTNESS OF BREATH   aspirin 81 MG tablet Take 81 mg by mouth daily.   cholestyramine (QUESTRAN) 4 g packet TAKE 1 PACKET BY MOUTH DAILY    ergocalciferol (DRISDOL) 1.25 MG (50000 UT) capsule Take 50,000 Units by mouth daily at 2 am.   esomeprazole (NEXIUM) 40 MG capsule TAKE 1 CAPSULE BY MOUTH 2 TIMES DAILY BEFORE A MEAL.   famotidine (PEPCID) 20 MG tablet TAKE 1 TABLET BY MOUTH EVERYDAY AT BEDTIME   fluocinonide gel (LIDEX) 0.05 %  (Patient not taking: Reported on 01/06/2023)   ketoconazole (NIZORAL) 2 % cream Apply 1 application  topically daily as needed for irritation.   latanoprost (XALATAN) 0.005 % ophthalmic solution latanoprost 0.005 % eye drops  PLACE 1 DROP IN BOTH EYES AT BEDTIME   levothyroxine (SYNTHROID) 50 MCG tablet TAKE 0.5 TABLETS (25 MCG TOTAL) BY MOUTH DAILY.   loratadine (CLARITIN) 10 MG tablet Take 1 tablet (10 mg total) by mouth daily.   meloxicam (MOBIC) 15 MG tablet Take 15 mg by mouth daily.   metoprolol tartrate (LOPRESSOR) 50 MG tablet TAKE 1 TABLET BY MOUTH TWICE A DAY   metroNIDAZOLE (METROCREAM) 0.75 % cream  (Patient not taking: Reported on 01/06/2023)   mometasone (NASONEX) 50 MCG/ACT nasal spray Place 2 sprays into the nose daily.   PARoxetine (PAXIL) 10 MG tablet Paxil 10 mg tablet  Take 1 tablet every day by oral route.   promethazine-dextromethorphan (PROMETHAZINE-DM) 6.25-15 MG/5ML syrup Take 2.5 mLs by mouth 4 (four) times daily as needed for cough.   rosuvastatin (CRESTOR) 20 MG tablet Take 1 tablet (20 mg total) by mouth daily.   sucralfate (CARAFATE) 1 g tablet Take 1 tablet (1 g total)  by mouth 2 (two) times daily for 14 days. Crush and mix in 30 ml of water to make a slurry   topiramate (TOPAMAX) 50 MG tablet TAKE 1 TABLET BY MOUTH EVERY DAY (Patient not taking: Reported on 01/06/2023)   traMADol (ULTRAM) 50 MG tablet Take 1 tablet (50 mg total) by mouth every 12 (twelve) hours as needed.   triamcinolone cream (KENALOG) 0.1 % Apply 1 application  topically 2 (two) times daily as needed (for breakout).   No facility-administered encounter medications on file as of 01/29/2023.    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   Fibromyalgia    Gastroparesis    GERD (gastroesophageal reflux disease) 09/13/2007   Glaucoma    Heart murmur    Hiatal hernia 04/25/2005   Hyperlipidemia    Hypertension    Hypothyroidism    IBS (irritable bowel syndrome)    Migraine    Osteoarthritis    Osteoporosis    Other and unspecified noninfectious gastroenteritis and colitis(558.9)    Sessile colonic polyp 05/30/2009   Vertigo    Vitamin D deficiency    Past Surgical History:  Procedure Laterality Date   BUNIONECTOMY  09/04/2011   left   CESAREAN SECTION     CHOLECYSTECTOMY  1985   COLONOSCOPY     FOOT OSTEOTOMY  2006   Right foot   ROTATOR CUFF REPAIR Right 04/19/2020   TUBAL LIGATION  1983   VAGINAL HYSTERECTOMY  1993   Family History  Problem Relation Age of Onset   Mitral valve prolapse Mother    Aneurysm Mother        cerebral   Hypertension Father    Colon polyps Father    Stroke Father    Hypertension Sister        x2   Diabetes Sister    Colon polyps Sister    Colon cancer Maternal Aunt    Stomach cancer Maternal Aunt    Pancreatic cancer Neg Hx    Migraines Neg Hx    Esophageal cancer Neg Hx    Inflammatory bowel disease Neg Hx    Liver disease Neg Hx    Rectal cancer Neg Hx    Social History   Socioeconomic History   Marital status: Married    Spouse name: Not on file   Number of children: 2   Years of education: Not on file   Highest education level: Not on file  Occupational  History   Occupation: Retired  Tobacco Use   Smoking status: Never   Smokeless tobacco: Never  Vaping Use   Vaping status: Never Used  Substance and Sexual Activity   Alcohol use: No   Drug use: No   Sexual activity: Not on file  Other Topics Concern   Not on file  Social History Narrative   Lives at home with husband   Right handed   Caffeine: 2 cups/day, sweet tea   Social Determinants of Health   Financial Resource Strain: Low Risk  (01/29/2023)  Overall Financial Resource Strain (CARDIA)    Difficulty of Paying Living Expenses: Not hard at all  Food Insecurity: No Food Insecurity (01/29/2023)   Hunger Vital Sign    Worried About Running Out of Food in the Last Year: Never true    Ran Out of Food in the Last Year: Never true  Transportation Needs: No Transportation Needs (01/29/2023)   PRAPARE - Administrator, Civil Service (Medical): No    Lack of Transportation (Non-Medical): No  Physical Activity: Sufficiently Active (01/29/2023)   Exercise Vital Sign    Days of Exercise per Week: 5 days    Minutes of Exercise per Session: 30 min  Stress: No Stress Concern Present (01/29/2023)   [employer] of Occupational Health - Occupational Stress Questionnaire    Feeling of Stress : Not at all  Social Connections: Socially Integrated (01/29/2023)   Social Connection and Isolation Panel [NHANES]    Frequency of Communication with Friends and Family: More than three times a week    Frequency of Social Gatherings with Friends and Family: More than three times a week    Attends Religious Services: More than 4 times per year    Active Member of [employer] or Organizations: Yes    Attends Engineer, structural: More than 4 times per year    Marital Status: Married    Tobacco Counseling Counseling given: Not Answered   Clinical Intake:  Pre-visit preparation completed: Yes  Pain : No/denies pain Pain Score: 2  (FIBROMYALGIA)     BMI - recorded:  31.74 Nutritional Status: BMI > 30  Obese Nutritional Risks: None Diabetes: No  How often do you need to have someone help you when you read instructions, pamphlets, or other written materials from your doctor or pharmacy?: 1 - Never What is the last grade level you completed in school?: MASTER'S DEGREE  Interpreter Needed?: No  Information entered by :: Dejanique Ruehl N. Nalina Yeatman, LPN.   Activities of Daily Living    01/29/2023   11:37 AM  In your present state of health, do you have any difficulty performing the following activities:  Hearing? 0  Vision? 0  Difficulty concentrating or making decisions? 1  Comment SOME REMEBERING ISSUES  Walking or climbing stairs? 0  Dressing or bathing? 0  Doing errands, shopping? 0  Preparing Food and eating ? N  Using the Toilet? N  In the past six months, have you accidently leaked urine? N  Do you have problems with loss of bowel control? N  Managing your Medications? N  Managing your Finances? N  Housekeeping or managing your Housekeeping? N    Patient Care Team: Myrlene Broker, MD as PCP - General (Internal Medicine) Jens Som Madolyn Frieze, MD as PCP - Cardiology (Cardiology) Jens Som Madolyn Frieze, MD as Consulting Physician (Cardiology) Rachael Fee, MD as Attending Physician (Gastroenterology) Marcene Corning, MD as Consulting Physician (Orthopedic Surgery) Janet Berlin, MD as Consulting Physician (Ophthalmology) Cari Caraway, MD as Referring Physician (Vascular Surgery) Levi Aland, MD as Consulting Physician (Obstetrics and Gynecology)  Indicate any recent Medical Services you may have received from other than [facility] providers in the past year (date may be approximate).     Assessment:   This is a routine wellness examination for Ramon.  Hearing/Vision screen Hearing Screening - Comments:: Patient denied any hearing difficulty.   No hearing aids.  Vision Screening - Comments:: Patient does wear corrective  lenses/contacts.  Annual eye exam done by: Janet Berlin,  MD.    Goals Addressed   None   Depression Screen    01/29/2023   11:36 AM 07/09/2022   10:27 AM 01/27/2022   11:40 AM 11/04/2021    2:33 PM 06/10/2021    8:34 AM 08/01/2020   10:51 AM 08/01/2019   11:50 AM  PHQ 2/9 Scores  PHQ - 2 Score 0 0 0 0 0 0 0  PHQ- 9 Score 0          Fall Risk    01/29/2023   11:35 AM 12/23/2022    8:57 AM 07/09/2022   10:27 AM 01/27/2022   11:38 AM 11/04/2021    2:33 PM  Fall Risk   Falls in the past year? 0 0 0 0 1  Number falls in past yr: 0 0 0 0 1  Injury with Fall? 0 0 0 0 1  Risk for fall due to : No Fall Risks  No Fall Risks No Fall Risks History of fall(s)  Follow up Falls prevention discussed Falls evaluation completed Falls evaluation completed Falls prevention discussed Falls evaluation completed    MEDICARE RISK AT HOME: Medicare Risk at Home Any stairs in or around the home?: Yes If so, are there any without handrails?: No Home free of loose throw rugs in walkways, pet beds, electrical cords, etc?: Yes Adequate lighting in your home to reduce risk of falls?: Yes Life alert?: No Use of a cane, walker or w/c?: No Grab bars in the bathroom?: Yes Shower chair or bench in shower?: No Elevated toilet seat or a handicapped toilet?: No  TIMED UP AND GO:  Was the test performed?  No    Cognitive Function: Normal cognitive status assessed by direct observation via telephone conversation by this Nurse Health Advisor. No abnormalities found.    01/29/2023   11:36 AM  MMSE - Mini Mental State Exam  Not completed: Unable to complete        01/29/2023   11:36 AM 01/27/2022   11:54 AM 08/01/2019   11:51 AM  6CIT Screen  What Year? 0 points 0 points 0 points  What month? 0 points 0 points 0 points  What time? 0 points 0 points 0 points  Count back from 20 0 points 0 points 0 points  Months in reverse 0 points 0 points 0 points  Repeat phrase 0 points 0 points 0 points   Total Score 0 points 0 points 0 points    Immunizations Immunization History  Administered Date(s) Administered   Fluad Quad(high Dose 65+) 12/30/2021   Influenza Split 02/12/2009, 05/21/2010, 02/18/2011, 02/16/2012   Influenza Whole 02/12/2009, 05/21/2010   Influenza, High Dose Seasonal PF 04/04/2015, 02/07/2016, 12/19/2016, 02/07/2021, 12/31/2022   Influenza, Seasonal, Injecte, Preservative Fre 03/29/2014   Influenza,inj,Quad PF,6+ Mos 01/27/2013   Influenza-Unspecified 07/14/2011, 01/12/2014, 01/12/2018, 12/09/2018   PFIZER Comirnaty(Gray Top)Covid-19 Tri-Sucrose Vaccine 07/27/2020, 12/30/2021   PFIZER(Purple Top)SARS-COV-2 Vaccination 05/04/2019, 05/23/2019, 12/20/2019   PNEUMOCOCCAL CONJUGATE-20 12/31/2022   Pneumococcal Conjugate-13 05/31/2015   Pneumococcal Polysaccharide-23 03/29/2014   Tdap 09/14/2012    TDAP status: Due, Education has been provided regarding the importance of this vaccine. Advised may receive this vaccine at local pharmacy or Health Dept. Aware to provide a copy of the vaccination record if obtained from local pharmacy or Health Dept. Verbalized acceptance and understanding.  Flu Vaccine status: Up to date  Pneumococcal vaccine status: Up to date  Covid-19 vaccine status: Completed vaccines  Qualifies for Shingles Vaccine? Yes   Zostavax completed No  Shingrix Completed?: No.    Education has been provided regarding the importance of this vaccine. Patient has been advised to call insurance company to determine out of pocket expense if they have not yet received this vaccine. Advised may also receive vaccine at local pharmacy or Health Dept. Verbalized acceptance and understanding.  Screening Tests Health Maintenance  Topic Date Due   Hepatitis C Screening  Never done   Zoster Vaccines- Shingrix (1 of 2) Never done   DTaP/Tdap/Td (2 - Td or Tdap) 09/15/2022   COVID-19 Vaccine (6 - 2023-24 season) 12/14/2022   Medicare Annual Wellness (AWV)   01/29/2024   Pneumonia Vaccine 75+ Years old  Completed   INFLUENZA VACCINE  Completed   DEXA SCAN  Completed   HPV VACCINES  Aged Out   Colonoscopy  Discontinued    Health Maintenance  Health Maintenance Due  Topic Date Due   Hepatitis C Screening  Never done   Zoster Vaccines- Shingrix (1 of 2) Never done   DTaP/Tdap/Td (2 - Td or Tdap) 09/15/2022   COVID-19 Vaccine (6 - 2023-24 season) 12/14/2022    Colorectal cancer screening: No longer required.   Mammogram status: Completed 09/2022. Repeat every year-PER PATIENT  Bone Density scan: No longer required.  Lung Cancer Screening: (Low Dose CT Chest recommended if Age 20-80 years, 20 pack-year currently smoking OR have quit w/in 15years.) does not qualify.   Lung Cancer Screening Referral: no  Additional Screening:  Hepatitis C Screening: does qualify; Completed: no  Vision Screening: Recommended annual ophthalmology exams for early detection of glaucoma and other disorders of the eye. Is the patient up to date with their annual eye exam?  Yes  Who is the provider or what is the name of the office in which the patient attends annual eye exams? Janet Berlin, MD. If pt is not established with a provider, would they like to be referred to a provider to establish care? No .   Dental Screening: Recommended annual dental exams for proper oral hygiene  Diabetic Foot Exam: N/A  Community Resource Referral / Chronic Care Management: CRR required this visit?  No   CCM required this visit?  No     Plan:     I have personally reviewed and noted the following in the patient's chart:   Medical and social history Use of alcohol, tobacco or illicit drugs  Current medications and supplements including opioid prescriptions. Patient is not currently taking opioid prescriptions. Functional ability and status Nutritional status Physical activity Advanced directives List of other physicians Hospitalizations, surgeries, and ER  visits in previous 12 months Vitals Screenings to include cognitive, depression, and falls Referrals and appointments  In addition, I have reviewed and discussed with patient certain preventive protocols, quality metrics, and best practice recommendations. A written personalized care plan for preventive services as well as general preventive health recommendations were provided to patient.     Mickeal Needy, LPN   [id number]   After Visit Summary: (MyChart) Due to this being a telephonic visit, the after visit summary with patients personalized plan was offered to patient via MyChart   Nurse Notes: n/a

## 2023-01-29 NOTE — Patient Instructions (Addendum)
Ms. Beel , Thank you for taking time to come for your Medicare Wellness Visit. I appreciate your ongoing commitment to your health goals. Please review the following plan we discussed and let me know if I can assist you in the future.   Referrals/Orders/Follow-Ups/Clinician Recommendations: No  This is a list of the screening recommended for you and due dates:  Health Maintenance  Topic Date Due   Hepatitis C Screening  Never done   Zoster (Shingles) Vaccine (1 of 2) Never done   DTaP/Tdap/Td vaccine (2 - Td or Tdap) 09/15/2022   COVID-19 Vaccine (6 - 2023-24 season) 12/14/2022   Medicare Annual Wellness Visit  01/29/2024   Pneumonia Vaccine  Completed   Flu Shot  Completed   DEXA scan (bone density measurement)  Completed   HPV Vaccine  Aged Out   Colon Cancer Screening  Discontinued   Conditions/risks identified: Yes; Reviewed health maintenance screenings with patient today and relevant education, vaccines, and/or referrals were provided. Please continue to do your personal lifestyle choices by: daily care of teeth and gums, regular physical activity (goal should be 5 days a week for 30 minutes), eat a healthy diet, avoid tobacco and drug use, limiting any alcohol intake, taking a low-dose aspirin (if not allergic or have been advised by your provider otherwise) and taking vitamins and minerals as recommended by your provider. Continue doing brain stimulating activities (puzzles, reading, adult coloring books, staying active) to keep memory sharp. Continue to eat heart healthy diet (full of fruits, vegetables, whole grains, lean protein, water--limit salt, fat, and sugar intake) and increase physical activity as tolerated.   Advanced directives: (Declined) Advance directive discussed with you today. Even though you declined this today, please call our office should you change your mind, and we can give you the proper paperwork for you to fill out.  Next Medicare Annual Wellness Visit  scheduled for next year: Yes

## 2023-02-15 ENCOUNTER — Other Ambulatory Visit: Payer: Self-pay | Admitting: Internal Medicine

## 2023-02-16 ENCOUNTER — Other Ambulatory Visit: Payer: Self-pay | Admitting: Internal Medicine

## 2023-02-27 ENCOUNTER — Encounter: Payer: Self-pay | Admitting: Gastroenterology

## 2023-03-13 ENCOUNTER — Other Ambulatory Visit: Payer: Self-pay | Admitting: Internal Medicine

## 2023-03-26 ENCOUNTER — Ambulatory Visit: Payer: Medicare PPO | Admitting: Physician Assistant

## 2023-03-26 ENCOUNTER — Other Ambulatory Visit: Payer: Medicare PPO

## 2023-03-26 ENCOUNTER — Encounter: Payer: Self-pay | Admitting: Physician Assistant

## 2023-03-26 VITALS — BP 110/64 | HR 73 | Ht 61.0 in | Wt 174.0 lb

## 2023-03-26 DIAGNOSIS — K219 Gastro-esophageal reflux disease without esophagitis: Secondary | ICD-10-CM | POA: Diagnosis not present

## 2023-03-26 DIAGNOSIS — K760 Fatty (change of) liver, not elsewhere classified: Secondary | ICD-10-CM

## 2023-03-26 DIAGNOSIS — K588 Other irritable bowel syndrome: Secondary | ICD-10-CM

## 2023-03-26 DIAGNOSIS — K766 Portal hypertension: Secondary | ICD-10-CM

## 2023-03-26 DIAGNOSIS — K638219 Small intestinal bacterial overgrowth, unspecified: Secondary | ICD-10-CM

## 2023-03-26 LAB — HEPATIC FUNCTION PANEL
ALT: 25 U/L (ref 0–35)
AST: 37 U/L (ref 0–37)
Albumin: 4.3 g/dL (ref 3.5–5.2)
Alkaline Phosphatase: 73 U/L (ref 39–117)
Bilirubin, Direct: 0.1 mg/dL (ref 0.0–0.3)
Total Bilirubin: 0.6 mg/dL (ref 0.2–1.2)
Total Protein: 7.1 g/dL (ref 6.0–8.3)

## 2023-03-26 MED ORDER — CHOLESTYRAMINE 4 G PO PACK: 4.0000 g | PACK | Freq: Every day | ORAL | 11 refills | Status: AC

## 2023-03-26 MED ORDER — ESOMEPRAZOLE MAGNESIUM 40 MG PO CPDR: DELAYED_RELEASE_CAPSULE | ORAL | 3 refills | Status: DC

## 2023-03-26 MED ORDER — RIFAXIMIN 550 MG PO TABS: 550.0000 mg | ORAL_TABLET | Freq: Three times a day (TID) | ORAL | 0 refills | Status: AC

## 2023-03-26 NOTE — Patient Instructions (Addendum)
We have sent the following medications to your pharmacy for you to pick up at your convenience: Magdalene Molly  Nexium    STOP Famotidine   Your provider has requested that you go to the basement level for lab work before leaving today. Press "B" on the elevator. The lab is located at the first door on the left as you exit the elevator.   You will be contacted by [facility] Sched[facility] in the next 2 days to arrange a RUQ Ultrasound.  The number on your caller ID will be [phone], please answer when they call.  If you have not heard from them in 2 days please call [phone] to schedule.     Due to recent changes in healthcare laws, you may see the results of your imaging and laboratory studies on MyChart before your provider has had a chance to review them.  We understand that in some cases there may be results that are confusing or concerning to you. Not all laboratory results come back in the same time frame and the provider may be waiting for multiple results in order to interpret others.  Please give [place] 48 hours in order for your provider to thoroughly review all the results before contacting the office for clarification of your results.    Follow up in 6 months with Dr Meridee Score   I appreciate the  opportunity to care for you  Thank You   Amy Myrtie Hawk

## 2023-03-26 NOTE — Progress Notes (Signed)
Subjective:    Patient ID: Angela [name], female    DOB: Mar 10, 1946, 77 y.o.   MRN: [id number]  HPI [name] is a pleasant 77 year old female, established with Dr. Meridee Score, who comes in today for follow-up. She was last seen in July 2024 at that time with complaints of bloating gassiness GERD and dysphagia. 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 She also underwent EGD in September 2024 which showed no evidence of esophageal stricture but she did have an empiric dilation to 18 mm, she was noted to have a nonobstructing Schatzki's ring, 2 cm hiatal hernia and mild patchy gastric erythema. Biopsies of the stomach showed mild chronic gastritis no H. pylori no intestinal metaplasia.  And esophageal biopsies showed reactive squamous mucosa. She is concerned today because she continues to feel very gassy and bloated and continues to have problems with diarrhea which has been intermittent. She does feel that Xifaxan made a big difference, but then her symptoms returned over the past month or so.  She also admits that she has not been taking Questran on a regular basis which she had been prescribed for potential bile salt induced diarrhea.  She says she is busy and has a hard time remembering to take it in the middle of the day. She is also having some intermittent regurgitation or reflux kind of symptoms after eating.  She is not having any heartburn or indigestion but says she will burp up fluid and those symptoms have been worse over the past couple of weeks.  She is on Nexium, usually only taking it once a day frequently forgets to take the evening dose and also has a prescription for famotidine but gets confused about which of these 2 she supposed to use.  Review of Systems Pertinent positive and negative review of systems were noted in the above HPI section.  All other review of systems was otherwise negative.   Outpatient  Encounter Medications as of 03/26/2023  Medication Sig   albuterol (VENTOLIN HFA) 108 (90 Base) MCG/ACT inhaler TAKE 2 PUFFS BY MOUTH EVERY 6 HOURS AS NEEDED FOR WHEEZE OR SHORTNESS OF BREATH   aspirin 81 MG tablet Take 81 mg by mouth daily.   ergocalciferol (DRISDOL) 1.25 MG (50000 UT) capsule Take 50,000 Units by mouth daily at 2 am.   famotidine (PEPCID) 20 MG tablet TAKE 1 TABLET BY MOUTH EVERYDAY AT BEDTIME   fluocinonide gel (LIDEX) 0.05 %    ketoconazole (NIZORAL) 2 % cream Apply 1 application  topically daily as needed for irritation.   latanoprost (XALATAN) 0.005 % ophthalmic solution latanoprost 0.005 % eye drops  PLACE 1 DROP IN BOTH EYES AT BEDTIME   levothyroxine (SYNTHROID) 50 MCG tablet TAKE 0.5 TABLETS (25 MCG TOTAL) BY MOUTH DAILY.   loratadine (CLARITIN) 10 MG tablet Take 1 tablet (10 mg total) by mouth daily.   meloxicam (MOBIC) 15 MG tablet Take 15 mg by mouth daily.   metoprolol tartrate (LOPRESSOR) 50 MG tablet TAKE 1 TABLET BY MOUTH TWICE A DAY   metroNIDAZOLE (METROCREAM) 0.75 % cream    mometasone (NASONEX) 50 MCG/ACT nasal spray Place 2 sprays into the nose daily.   PARoxetine (PAXIL) 10 MG tablet Paxil 10 mg tablet  Take 1 tablet every day by oral route.   promethazine-dextromethorphan (PROMETHAZINE-DM) 6.25-15 MG/5ML syrup Take 2.5 mLs by mouth 4 (four) times daily as needed for cough.   rifaximin (XIFAXAN) 550 MG TABS  tablet Take 1 tablet (550 mg total) by mouth 3 (three) times daily.   rosuvastatin (CRESTOR) 20 MG tablet Take 1 tablet (20 mg total) by mouth daily.   topiramate (TOPAMAX) 50 MG tablet TAKE 1 TABLET BY MOUTH EVERY DAY   traMADol (ULTRAM) 50 MG tablet Take 1 tablet (50 mg total) by mouth every 12 (twelve) hours as needed.   triamcinolone cream (KENALOG) 0.1 % Apply 1 application  topically 2 (two) times daily as needed (for breakout).   [DISCONTINUED] cholestyramine (QUESTRAN) 4 g packet TAKE 1 PACKET BY MOUTH DAILY   [DISCONTINUED] esomeprazole  (NEXIUM) 40 MG capsule TAKE 1 CAPSULE BY MOUTH 2 TIMES DAILY BEFORE A MEAL.   cholestyramine (QUESTRAN) 4 g packet Take 1 packet (4 g total) by mouth daily. At least 2 hours away from other meds   esomeprazole (NEXIUM) 40 MG capsule TAKE 1 CAPSULE BY MOUTH 2 TIMES DAILY BEFORE A MEAL.   sucralfate (CARAFATE) 1 g tablet Take 1 tablet (1 g total) by mouth 2 (two) times daily for 14 days. Crush and mix in 30 ml of water to make a slurry   No facility-administered encounter medications on file as of 03/26/2023.   Allergies  Allergen Reactions   Shellfish Allergy Anaphylaxis   Tetracycline Other (See Comments)    GI bleed   Effexor Xr [Venlafaxine Hcl] Other (See Comments)    Nightmare and vivid dreams   Codeine Nausea And Vomiting and Other (See Comments)    GI upset/vomit   Iohexol Hives    Per Dr.Grady patient doesn't need a 13 hour prep, only itching - patient already had a rash due to autoimmune condition //rls    Patient Active Problem List   Diagnosis Date Noted   Esophageal dysphagia 11/02/2022   Bloating 11/02/2022   NASH (nonalcoholic steatohepatitis) 07/10/2022   Impaired fasting blood sugar 01/06/2022   Obesity 06/10/2021   Hot flashes 06/10/2021   Wheezing 12/31/2020   SOB (shortness of breath) on exertion 08/03/2020   Disequilibrium 07/25/2020   Cough 04/12/2019   Stress incontinence 10/23/2017   Bacterial vaginosis 09/21/2017   Atypical squamous cells of undetermined significance (ASC-US) on cervical Pap smear 09/21/2017   Osteoarthritis    IBS (irritable bowel syndrome)    Hypertension    Glaucoma    Gastroparesis    Colon polyps    Bile salt-induced diarrhea    Anxiety    Acne rosacea    Routine general medical examination at a health care facility 03/03/2017   Abnormal CT of the head 11/10/2016   Vertigo 11/09/2016   Allergic rhinitis 02/07/2016   Lichen of skin 09/05/2015   Hemorrhoid 02/13/2015   Numbness and tingling in both hands 05/15/2014    Cerebrovascular disease 10/18/2012   Arteriopathy, intracranial, fibromuscular dysplasia, chronic (HCC) 10/18/2012   Fibromuscular hyperplasia of artery (HCC)    Migraine headache 09/24/2011   GERD (gastroesophageal reflux disease) 08/16/2010   Hyperlipidemia 12/20/2009   Hypothyroidism 07/20/2009   Vitamin D deficiency 07/20/2009   Acute sinusitis 12/11/2008   Esophageal stricture 09/13/2007   Essential hypertension 05/14/2007   ACNE ROSACEA 05/14/2007   OSTEOARTHRITIS 05/14/2007   CAROTID BRUIT 05/14/2007   Fibromyalgia 03/30/2007   Hiatal hernia 04/25/2005   External hemorrhoids 05/10/2004   Social History   Socioeconomic History   Marital status: Married    Spouse name: Not on file   Number of children: 2   Years of education: Not on file   Highest education level: Not on  file  Occupational History   Occupation: Retired  Tobacco Use   Smoking status: Never   Smokeless tobacco: Never  Vaping Use   Vaping status: Never Used  Substance and Sexual Activity   Alcohol use: No   Drug use: No   Sexual activity: Not on file  Other Topics Concern   Not on file  Social History Narrative   Lives at home with husband   Right handed   Caffeine: 2 cups/day, sweet tea   Social Drivers of Corporate investment banker Strain: Low Risk  (01/29/2023)   Overall Financial Resource Strain (CARDIA)    Difficulty of Paying Living Expenses: Not hard at all  Food Insecurity: No Food Insecurity (01/29/2023)   Hunger Vital Sign    Worried About Running Out of Food in the Last Year: Never true    Ran Out of Food in the Last Year: Never true  Transportation Needs: No Transportation Needs (01/29/2023)   PRAPARE - Administrator, Civil Service (Medical): No    Lack of Transportation (Non-Medical): No  Physical Activity: Sufficiently Active (01/29/2023)   Exercise Vital Sign    Days of Exercise per Week: 5 days    Minutes of Exercise per Session: 30 min  Stress: No Stress  Concern Present (01/29/2023)   [employer] of Occupational Health - Occupational Stress Questionnaire    Feeling of Stress : Not at all  Social Connections: Socially Integrated (01/29/2023)   Social Connection and Isolation Panel [NHANES]    Frequency of Communication with Friends and Family: More than three times a week    Frequency of Social Gatherings with Friends and Family: More than three times a week    Attends Religious Services: More than 4 times per year    Active Member of [employer] or Organizations: Yes    Attends Banker Meetings: More than 4 times per year    Marital Status: Married  Catering manager Violence: Not At Risk (01/29/2023)   Humiliation, Afraid, Rape, and Kick questionnaire    Fear of Current or Ex-Partner: No    Emotionally Abused: No    Physically Abused: No    Sexually Abused: No    Ms. Summerville's family history includes Aneurysm in her mother; Colon cancer in her maternal aunt; Colon polyps in her father and sister; Diabetes in her sister; Hypertension in her father and sister; Mitral valve prolapse in her mother; Stomach cancer in her maternal aunt; Stroke in her father.      Objective:    Vitals:   03/26/23 1115  BP: 110/64  Pulse: 73    Physical Exam Well-developed well-nourished older African-American female in no acute distress pleasant.  Height, Weight, 174 BMI 32.8  HEENT; nontraumatic normocephalic, EOMI, PE R LA, sclera anicteric. Oropharynx; not examined today Neck; supple, no JVD Cardiovascular; regular rate and rhythm with S1-S2, no murmur rub or gallop Pulmonary; Clear bilaterally Abdomen; soft, nontender, nondistended, no palpable mass or hepatosplenomegaly, bowel sounds are active Rectal; not done today Skin; benign exam, no jaundice rash or appreciable lesions Extremities; no clubbing cyanosis or edema skin warm and dry Neuro/Psych; alert and oriented x4, grossly nonfocal mood and affect appropriate         Assessment & Plan:   #46 77 year old female, with complaints of gassiness, bloating and intermittent diarrhea. She has previously confirmed SIBO and had good response to Xifaxan which had been given in August 2024. Suspect she may have had resurgence of the SIBO,  and also likely has an underlying component of bile salt induced diarrhea and has not been compliant with Questran.  #2 chronic GERD-it sounds as if her acid symptoms are actually controlled but she continues to have some sour brash type symptoms postprandially. She has also had some confusion about her medications.  #3 dysphagia/resolved post empiric esophageal dilation with EGD September 2024 #4 hypertension #5.  Fibromuscular hyperplasia/arteriopathy #6 diverticulosis #7.  Status post cholecystectomy #8.  Fibromyalgia #9 NASH/fatty liver  Plan; we discussed medication management carefully. Will give her another course of Xifaxan 550 mg p.o. 3 times daily x 14 days For simplicity will increase Nexium to 40 mg p.o. twice daily AC.  Advised that if she forgets to take the second dose prior to dinner, okay to take when she remembers just get 2 doses in daily. Strict antireflux regimen, we discussed smaller meals, and n.p.o. for 3 hours prior to bedtime to decrease sour brash type symptoms She will retry Questran 4 g daily, taken at least 2 hours away from any other medications For follow-up of fatty liver disease will check hepatic panel today and schedule for upper abdominal ultrasound. Will plan follow-up with Dr. Meridee Score or myself in 6 months.  She was also advised to call and speak to my nurse if she does not have improvement in her symptoms with the above regimen.    Braxen Dobek Oswald Hillock PA-C 03/26/2023   Cc: Myrlene Broker, *

## 2023-03-26 NOTE — Progress Notes (Signed)
Attending Physician's Attestation   I have reviewed the chart.   I agree with the Advanced Practitioner's note, impression, and recommendations with any updates as below.    Emersyn Wyss Mansouraty, MD [facility] Office # [phone]  

## 2023-04-23 ENCOUNTER — Telehealth: Payer: Self-pay | Admitting: Gastroenterology

## 2023-04-23 DIAGNOSIS — R197 Diarrhea, unspecified: Secondary | ICD-10-CM | POA: Insufficient documentation

## 2023-04-23 DIAGNOSIS — R1114 Bilious vomiting: Secondary | ICD-10-CM | POA: Insufficient documentation

## 2023-04-23 MED ORDER — ONDANSETRON 8 MG PO TBDP
8.0000 mg | ORAL_TABLET | Freq: Three times a day (TID) | ORAL | 0 refills | Status: AC | PRN
Start: 2023-04-23 — End: ?

## 2023-04-23 MED ORDER — PROCHLORPERAZINE 25 MG RE SUPP
25.0000 mg | Freq: Two times a day (BID) | RECTAL | 0 refills | Status: AC | PRN
Start: 2023-04-23 — End: ?

## 2023-04-23 NOTE — Telephone Encounter (Signed)
 Patient called overnight. She is experiencing significant nausea and vomiting and abdominal discomfort with diarrhea. Her husband was sick earlier this week and has improved after 36 hours. Now she is having significant symptoms and concerned about infection. Patient is wondering if there is any other therapies that she can do over-the-counter or if she needs to be evaluated further. She is not having any upper respiratory symptoms. She describes some chest discomfort from the significant nausea and vomiting that she has been experiencing but she is not having any exertional chest pain or dyspnea on exertion. I recommend that we send in an antiemetic oral dissolving Zofran  after trying to help her symptoms. I am also going to send in Compazine  suppositories if the Zofran  is not helpful enough or she cannot tolerate it. She is going to initiate Imodium tonight (she can use 4 mg as her first dose). Have asked her to also get Pepto-Bismol and use that as needed if the nausea and diarrhea is not slowing down with Imodium.  I will have my nurse check in on her tomorrow. If she still experiencing significant symptoms after the call, she should come in for a stat CBC/CMP/amylase/lipase and pick up a stool kit for C. difficile/ova and parasite/stool culture and bring that back (if possible).  She is appreciative for the call back and agrees with this plan of action.  Aloha Finner, MD [facility] Office # [phone] LB right there thank you

## 2023-04-24 NOTE — Telephone Encounter (Signed)
 Spoke with the pt and she tells me that she has greatly improved. She still has some diarrhea but it has lessened a great deal. She will call if she does not continue to improve.

## 2023-04-24 NOTE — Telephone Encounter (Signed)
 Left message on machine to call back

## 2023-05-14 ENCOUNTER — Other Ambulatory Visit: Payer: Self-pay | Admitting: Internal Medicine

## 2023-05-25 ENCOUNTER — Other Ambulatory Visit: Payer: Self-pay | Admitting: Gastroenterology

## 2023-06-10 DIAGNOSIS — M25512 Pain in left shoulder: Secondary | ICD-10-CM | POA: Diagnosis not present

## 2023-06-10 DIAGNOSIS — M25552 Pain in left hip: Secondary | ICD-10-CM | POA: Diagnosis not present

## 2023-06-21 ENCOUNTER — Other Ambulatory Visit: Payer: Self-pay | Admitting: Internal Medicine

## 2023-06-25 DIAGNOSIS — N951 Menopausal and female climacteric states: Secondary | ICD-10-CM | POA: Diagnosis not present

## 2023-06-25 DIAGNOSIS — N898 Other specified noninflammatory disorders of vagina: Secondary | ICD-10-CM | POA: Diagnosis not present

## 2023-07-22 DIAGNOSIS — I6523 Occlusion and stenosis of bilateral carotid arteries: Secondary | ICD-10-CM | POA: Diagnosis not present

## 2023-07-29 DIAGNOSIS — I679 Cerebrovascular disease, unspecified: Secondary | ICD-10-CM | POA: Diagnosis not present

## 2023-07-29 DIAGNOSIS — E559 Vitamin D deficiency, unspecified: Secondary | ICD-10-CM | POA: Diagnosis not present

## 2023-07-29 DIAGNOSIS — I773 Arterial fibromuscular dysplasia: Secondary | ICD-10-CM | POA: Diagnosis not present

## 2023-07-29 DIAGNOSIS — I10 Essential (primary) hypertension: Secondary | ICD-10-CM | POA: Diagnosis not present

## 2023-07-29 DIAGNOSIS — M797 Fibromyalgia: Secondary | ICD-10-CM | POA: Diagnosis not present

## 2023-08-07 ENCOUNTER — Ambulatory Visit: Payer: Self-pay

## 2023-08-07 NOTE — Telephone Encounter (Signed)
 Copied from CRM [id number]. Topic: Clinical - Red Word Triage >> Aug 07, 2023  9:59 AM [place] A wrote: [facility] that prompted transfer to Nurse Triage: Patient has been lightheaded and dizzy since she has stopped taking her Metformin . Has pain but not sure if it is from her Fibromyalgia  Chief Complaint: lightheaded, back pain Symptoms: lightheadedness,  dizziness, titling at times,, feel faint at times, back pain 6/10 upper back Frequency: x 2 weeks Pertinent Negatives: Patient denies chest pain, SOB, n/v Disposition: [] ED /[] Urgent Care (no appt availability in office) / [x] Appointment(In office/virtual)/ []  [facility]/ [] Home Care/ [] Refused Recommended Disposition /[]  Mobile Bus/ []  Follow-up with PCP Additional Notes: pt reported she stopped taking metformin  a couple of months ago & was prescribed TOPAMAX  & never started the medication.  Pt states after topping metformin  she has been has episodes of lightheaded, dizziness, faint.  In office appt made with PCP. Reason for Disposition  Back pain present > 2 weeks  [1] MILD dizziness (e.g., walking normally) AND [2] has been evaluated by doctor (or NP/PA) for this  Answer Assessment - Initial Assessment Questions 1. DESCRIPTION: "Describe your dizziness."     Lightheaded, dizziness 2. LIGHTHEADED: "Do you feel lightheaded?" (e.g., somewhat faint, woozy, weak upon standing), possible tinting as wll    Felt like you were about to fall 3. VERTIGO: "Do you feel like either you or the room is spinning or tilting?" (i.e. vertigo)     titling 4. SEVERITY: "How bad is it?"  "Do you feel like you are going to faint?" "Can you stand and walk?"   - MILD: Feels slightly dizzy, but walking normally.   - MODERATE: Feels unsteady when walking, but not falling; interferes with normal activities (e.g., school, work).   - SEVERE: Unable to walk without falling, or requires assistance to walk without falling; feels like passing out  now.      mild 5. ONSET:  "When did the dizziness begin?"     X 2 weeks ago 6. AGGRAVATING FACTORS: "Does anything make it worse?" (e.g., standing, change in head position)     no 7. HEART RATE: "Can you tell me your heart rate?" "How many beats in 15 seconds?"  (Note: not all patients can do this)       no 8. CAUSE: "What do you think is causing the dizziness?"     unknown 9. RECURRENT SYMPTOM: "Have you had dizziness before?" If Yes, ask: "When was the last time?" "What happened that time?"     no 10. OTHER SYMPTOMS: "Do you have any other symptoms?" (e.g., fever, chest pain, vomiting, diarrhea, bleeding)       no 11. PREGNANCY: "Is there any chance you are pregnant?" "When was your last menstrual period?"       Na/  Answer Assessment - Initial Assessment Questions 1. ONSET: "When did the pain begin?"      X 1 week 2. LOCATION: "Where does it hurt?" (upper, mid or lower back) Upper  Back pain 3. SEVERITY: "How bad is the pain?"  (e.g., Scale 1-10; mild, moderate, or severe)   - MILD (1-3): Doesn't interfere with normal activities.    - MODERATE (4-7): Interferes with normal activities or awakens from sleep.    - SEVERE (8-10): Excruciating pain, unable to do any normal activities.      6/10 4. PATTERN: "Is the pain constant?" (e.g., yes, no; constant, intermittent)      Comes and goes 5. RADIATION: "  Does the pain shoot into your legs or somewhere else?"     no 6. CAUSE:  "What do you think is causing the back pain?"      Medical history 7. BACK OVERUSE:  "Any recent lifting of heavy objects, strenuous work or exercise?"     no 8. MEDICINES: "What have you taken so far for the pain?" (e.g., nothing, acetaminophen , NSAIDS)     tramadol  9. NEUROLOGIC SYMPTOMS: "Do you have any weakness, numbness, or problems with bowel/bladder control?"     Numbness and tingling 10. OTHER SYMPTOMS: "Do you have any other symptoms?" (e.g., fever, abdomen pain, burning with urination, blood in  urine)       no 11. PREGNANCY: "Is there any chance you are pregnant?" "When was your last menstrual period?"       N/a  Protocols used: Dizziness - Lightheadedness-A-AH, Back Pain-A-AH

## 2023-08-17 ENCOUNTER — Ambulatory Visit: Admitting: Internal Medicine

## 2023-08-17 ENCOUNTER — Encounter: Payer: Self-pay | Admitting: Internal Medicine

## 2023-08-17 VITALS — BP 116/84 | HR 79 | Temp 98.3°F | Ht 61.0 in | Wt 176.0 lb

## 2023-08-17 DIAGNOSIS — R7303 Prediabetes: Secondary | ICD-10-CM

## 2023-08-17 DIAGNOSIS — E785 Hyperlipidemia, unspecified: Secondary | ICD-10-CM | POA: Diagnosis not present

## 2023-08-17 DIAGNOSIS — E119 Type 2 diabetes mellitus without complications: Secondary | ICD-10-CM

## 2023-08-17 DIAGNOSIS — E1169 Type 2 diabetes mellitus with other specified complication: Secondary | ICD-10-CM

## 2023-08-17 DIAGNOSIS — H9193 Unspecified hearing loss, bilateral: Secondary | ICD-10-CM

## 2023-08-17 DIAGNOSIS — Z0000 Encounter for general adult medical examination without abnormal findings: Secondary | ICD-10-CM

## 2023-08-17 DIAGNOSIS — I773 Arterial fibromuscular dysplasia: Secondary | ICD-10-CM

## 2023-08-17 DIAGNOSIS — M797 Fibromyalgia: Secondary | ICD-10-CM

## 2023-08-17 DIAGNOSIS — I10 Essential (primary) hypertension: Secondary | ICD-10-CM

## 2023-08-17 DIAGNOSIS — R062 Wheezing: Secondary | ICD-10-CM | POA: Diagnosis not present

## 2023-08-17 DIAGNOSIS — E039 Hypothyroidism, unspecified: Secondary | ICD-10-CM

## 2023-08-17 DIAGNOSIS — K7581 Nonalcoholic steatohepatitis (NASH): Secondary | ICD-10-CM

## 2023-08-17 LAB — CBC
HCT: 38.2 % (ref 36.0–46.0)
Hemoglobin: 13.1 g/dL (ref 12.0–15.0)
MCHC: 34.3 g/dL (ref 30.0–36.0)
MCV: 94.5 fl (ref 78.0–100.0)
Platelets: 283 10*3/uL (ref 150.0–400.0)
RBC: 4.04 Mil/uL (ref 3.87–5.11)
RDW: 13.9 % (ref 11.5–15.5)
WBC: 5.6 10*3/uL (ref 4.0–10.5)

## 2023-08-17 LAB — LIPID PANEL
Cholesterol: 139 mg/dL (ref 0–200)
HDL: 51.5 mg/dL (ref >39.00)
LDL Cholesterol: 60 mg/dL (ref 0–99)
NonHDL: 87.97
Total CHOL/HDL Ratio: 3
Triglycerides: 142 mg/dL (ref 0.0–149.0)
VLDL: 28.4 mg/dL (ref 0.0–40.0)

## 2023-08-17 LAB — COMPREHENSIVE METABOLIC PANEL WITH GFR
ALT: 19 U/L (ref 0–35)
AST: 26 U/L (ref 0–37)
Albumin: 4.3 g/dL (ref 3.5–5.2)
Alkaline Phosphatase: 65 U/L (ref 39–117)
BUN: 12 mg/dL (ref 6–23)
CO2: 30 meq/L (ref 19–32)
Calcium: 9.5 mg/dL (ref 8.4–10.5)
Chloride: 102 meq/L (ref 96–112)
Creatinine, Ser: 0.91 mg/dL (ref 0.40–1.20)
GFR: 61.05 mL/min (ref >60.00)
Glucose, Bld: 278 mg/dL — ABNORMAL HIGH (ref 70–99)
Potassium: 3.9 meq/L (ref 3.5–5.1)
Sodium: 141 meq/L (ref 135–145)
Total Bilirubin: 0.4 mg/dL (ref 0.2–1.2)
Total Protein: 7.1 g/dL (ref 6.0–8.3)

## 2023-08-17 LAB — HEMOGLOBIN A1C: Hgb A1c MFr Bld: 7.1 % — ABNORMAL HIGH (ref 4.6–6.5)

## 2023-08-17 LAB — MICROALBUMIN / CREATININE URINE RATIO
Creatinine,U: 184.3 mg/dL
Microalb Creat Ratio: 7.6 mg/g (ref 0.0–30.0)
Microalb, Ur: 1.4 mg/dL (ref 0.0–1.9)

## 2023-08-17 MED ORDER — ALBUTEROL SULFATE HFA 108 (90 BASE) MCG/ACT IN AERS: 1.0000 | INHALATION_SPRAY | Freq: Four times a day (QID) | RESPIRATORY_TRACT | 1 refills | Status: DC | PRN

## 2023-08-17 NOTE — Progress Notes (Unsigned)
   Subjective:   Patient ID: Angela [name], female    DOB: [birth year], 78 y.o.   MRN: [id number]  HPI The patient is here for physical.  PMH, [facility], social history reviewed and updated  Review of Systems  Objective:  Physical Exam  Vitals:   08/17/23 0900  BP: 116/84  Pulse: 79  Temp: 98.3 F (36.8 C)  TempSrc: Oral  SpO2: 97%  Weight: 176 lb (79.8 kg)  Height: 5\' 1"  (1.549 m)    Assessment & Plan:

## 2023-08-17 NOTE — Patient Instructions (Signed)
 Try the meloxicam again that will help with the arthritis pain.

## 2023-08-18 ENCOUNTER — Encounter: Payer: Self-pay | Admitting: Internal Medicine

## 2023-08-18 NOTE — Assessment & Plan Note (Signed)
Checking TSH and adjust levothyroxine 25 mcg daily as needed.

## 2023-08-18 NOTE — Assessment & Plan Note (Signed)
 Checking lipid panel and adjust crestor as needed.

## 2023-08-18 NOTE — Assessment & Plan Note (Signed)
 Stable and BP goal <130/80.

## 2023-08-18 NOTE — Assessment & Plan Note (Signed)
Refill albuterol which she uses prn.

## 2023-08-18 NOTE — Assessment & Plan Note (Signed)
 Previous HgA1c in diabetic range and recheck on labs also in diabetic range. Did check lipid and UACR and foot exam and CMP. She is not on meds. Is on statin already.

## 2023-08-18 NOTE — Assessment & Plan Note (Signed)
 Flu shot yarly. Pneumonia complete. Shingrix due at pharmacy. Tetanus due at pharmacy. Colonoscopy up to date. Mammogram up to date, pap smear up to date and dexa up to date. Counseled about sun safety and mole surveillance. Counseled about the dangers of distracted driving. Given 10 year screening recommendations.

## 2023-08-18 NOTE — Assessment & Plan Note (Signed)
 Checking CMP and adjust as needed.

## 2023-08-18 NOTE — Assessment & Plan Note (Signed)
 BP at goal on metoprolol  and if needed ACE-I/ARB would be next choice to add. Checking CMP and adjust as needed.

## 2023-08-18 NOTE — Assessment & Plan Note (Signed)
 Still having pain with this and advised to resume meloxicam as she had stopped.

## 2023-08-26 ENCOUNTER — Ambulatory Visit: Payer: Self-pay

## 2023-09-04 ENCOUNTER — Other Ambulatory Visit: Payer: Self-pay | Admitting: Internal Medicine

## 2023-09-04 DIAGNOSIS — R062 Wheezing: Secondary | ICD-10-CM

## 2023-09-14 ENCOUNTER — Ambulatory Visit: Attending: Internal Medicine | Admitting: Audiologist

## 2023-09-14 DIAGNOSIS — H903 Sensorineural hearing loss, bilateral: Secondary | ICD-10-CM | POA: Diagnosis not present

## 2023-09-14 NOTE — Procedures (Signed)
  Outpatient Audiology and [facility] [address] [place], [place]  [address] [phone]  AUDIOLOGICAL  EVALUATION  NAME: Angela [name]     DOB:   04-07-1946      MRN: [id number]                                                                                     DATE: 09/14/2023     REFERENT: Adelia Homestead, MD STATUS: Outpatient DIAGNOSIS: Sensorineural Hearing Loss, Mild   History: Coda was seen for an audiological evaluation for a baseline hearing test. It can be hard for her to hear in noise.  Carmell denies pain, pressure, or tinnitus today. She has sinus issues and has intermittent pressure in her ears. She has history of vertigo.  Lilith no history of hazardous noise exposure.  Medical history shows no additional risk for hearing loss.    Evaluation:  Otoscopy showed a clear view of the tympanic membranes, bilaterally Tympanometry results were consistent with normal middle ear function, bilaterally   Audiometric testing was completed using Conventional Audiometry techniques with insert earphones and supraural headphones. Test results are consistent with normal hearing 250-4kHz sloping to mild sensorineural  loss at 6-8kHz only bilaterally. Speech Recognition Thresholds were obtained at  16dB HL in the right ear and at 10  dB HL in the left ear. Word Recognition Testing was completed at  40dB SL and Dailynn scored 100% in each ear.    Results:  The test results were reviewed with Anitra Ket. She has essentially normal hearing. At the very highest pitches, 6-8kHz she has a mild hearing loss. This will not impact speech understanding. Audiogram printed and provided to Gore.    Recommendations: No further testing is recommended at this time. If she feels her hearing change, further audiological testing is recommended.           21 minutes spent testing and counseling on results.   If you have any questions please feel free to contact me at [phone]  [phone].  Raynald Calkins Stalnaker Au.D.  Audiologist   09/14/2023  9:48 AM  Cc: Adelia Homestead, MD

## 2023-09-22 DIAGNOSIS — L738 Other specified follicular disorders: Secondary | ICD-10-CM | POA: Diagnosis not present

## 2023-09-22 DIAGNOSIS — D1801 Hemangioma of skin and subcutaneous tissue: Secondary | ICD-10-CM | POA: Diagnosis not present

## 2023-09-22 DIAGNOSIS — L821 Other seborrheic keratosis: Secondary | ICD-10-CM | POA: Diagnosis not present

## 2023-09-22 DIAGNOSIS — L814 Other melanin hyperpigmentation: Secondary | ICD-10-CM | POA: Diagnosis not present

## 2023-09-22 DIAGNOSIS — L700 Acne vulgaris: Secondary | ICD-10-CM | POA: Diagnosis not present

## 2023-09-22 DIAGNOSIS — L719 Rosacea, unspecified: Secondary | ICD-10-CM | POA: Diagnosis not present

## 2023-09-22 DIAGNOSIS — L439 Lichen planus, unspecified: Secondary | ICD-10-CM | POA: Diagnosis not present

## 2023-09-22 DIAGNOSIS — L578 Other skin changes due to chronic exposure to nonionizing radiation: Secondary | ICD-10-CM | POA: Diagnosis not present

## 2023-09-22 DIAGNOSIS — L304 Erythema intertrigo: Secondary | ICD-10-CM | POA: Diagnosis not present

## 2023-09-28 ENCOUNTER — Telehealth: Payer: Self-pay | Admitting: Internal Medicine

## 2023-09-28 NOTE — Telephone Encounter (Signed)
 Copied from CRM [id number]. Topic: Clinical - Medication Question >> Sep 28, 2023 11:09 AM Angela [name] wrote: Reason for CRM: Patient would like Dr. Nicolette Barrio or nurse to give a call regarding medication Metformin , patient is unsure if she needs to get back on. Please call [phone] to confirm.

## 2023-10-12 DIAGNOSIS — R350 Frequency of micturition: Secondary | ICD-10-CM | POA: Diagnosis not present

## 2023-10-12 DIAGNOSIS — Z01411 Encounter for gynecological examination (general) (routine) with abnormal findings: Secondary | ICD-10-CM | POA: Diagnosis not present

## 2023-10-12 DIAGNOSIS — Z780 Asymptomatic menopausal state: Secondary | ICD-10-CM | POA: Diagnosis not present

## 2023-10-12 DIAGNOSIS — N905 Atrophy of vulva: Secondary | ICD-10-CM | POA: Diagnosis not present

## 2023-10-19 DIAGNOSIS — Z1231 Encounter for screening mammogram for malignant neoplasm of breast: Secondary | ICD-10-CM | POA: Diagnosis not present

## 2023-10-21 DIAGNOSIS — M79642 Pain in left hand: Secondary | ICD-10-CM | POA: Diagnosis not present

## 2023-10-21 DIAGNOSIS — M79671 Pain in right foot: Secondary | ICD-10-CM | POA: Diagnosis not present

## 2023-10-21 DIAGNOSIS — M79641 Pain in right hand: Secondary | ICD-10-CM | POA: Diagnosis not present

## 2023-11-11 ENCOUNTER — Other Ambulatory Visit: Payer: Self-pay | Admitting: Internal Medicine

## 2023-11-13 ENCOUNTER — Other Ambulatory Visit: Payer: Self-pay | Admitting: Internal Medicine

## 2023-11-13 NOTE — Telephone Encounter (Signed)
 Copied from CRM #[id number]. Topic: Clinical - Medication Refill >> Nov 13, 2023  9:29 AM Suzen RAMAN wrote: Medication: topiramate  (TOPAMAX ) 50 MG tablet   Has the patient contacted their pharmacy? Yes   This is the patient's preferred pharmacy:  [facility] [address] [place] - [address] [place] [address] Phone: [phone] Fax: [phone]  Is this the correct pharmacy for this prescription? Yes If no, delete pharmacy and type the correct one.   Has the prescription been filled recently? No  Is the patient out of the medication? Yes  Has the patient been seen for an appointment in the last year OR does the patient have an upcoming appointment? Yes  Can we respond through MyChart? Yes  Agent: Please be advised that Rx refills may take up to 3 business days. We ask that you follow-up with your pharmacy.

## 2023-12-07 ENCOUNTER — Ambulatory Visit: Admitting: Internal Medicine

## 2023-12-07 ENCOUNTER — Telehealth: Payer: Self-pay

## 2023-12-07 ENCOUNTER — Encounter: Payer: Self-pay | Admitting: Internal Medicine

## 2023-12-07 VITALS — BP 128/84 | HR 65 | Temp 98.6°F | Ht 61.0 in | Wt 178.6 lb

## 2023-12-07 DIAGNOSIS — E118 Type 2 diabetes mellitus with unspecified complications: Secondary | ICD-10-CM

## 2023-12-07 DIAGNOSIS — E559 Vitamin D deficiency, unspecified: Secondary | ICD-10-CM

## 2023-12-07 DIAGNOSIS — R202 Paresthesia of skin: Secondary | ICD-10-CM

## 2023-12-07 DIAGNOSIS — E1159 Type 2 diabetes mellitus with other circulatory complications: Secondary | ICD-10-CM | POA: Diagnosis not present

## 2023-12-07 DIAGNOSIS — R200 Anesthesia of skin: Secondary | ICD-10-CM | POA: Diagnosis not present

## 2023-12-07 LAB — COMPREHENSIVE METABOLIC PANEL WITH GFR
ALT: 23 U/L (ref 0–35)
AST: 28 U/L (ref 0–37)
Albumin: 4.4 g/dL (ref 3.5–5.2)
Alkaline Phosphatase: 64 U/L (ref 39–117)
BUN: 13 mg/dL (ref 6–23)
CO2: 27 meq/L (ref 19–32)
Calcium: 9.5 mg/dL (ref 8.4–10.5)
Chloride: 107 meq/L (ref 96–112)
Creatinine, Ser: 0.88 mg/dL (ref 0.40–1.20)
GFR: 63.41 mL/min (ref >60.00)
Glucose, Bld: 86 mg/dL (ref 70–99)
Potassium: 3.8 meq/L (ref 3.5–5.1)
Sodium: 143 meq/L (ref 135–145)
Total Bilirubin: 0.4 mg/dL (ref 0.2–1.2)
Total Protein: 7.1 g/dL (ref 6.0–8.3)

## 2023-12-07 LAB — HEMOGLOBIN A1C: Hgb A1c MFr Bld: 7.3 % — ABNORMAL HIGH (ref 4.6–6.5)

## 2023-12-07 LAB — TSH: TSH: 1.75 u[IU]/mL (ref 0.35–5.50)

## 2023-12-07 LAB — CBC
HCT: 36.3 % (ref 36.0–46.0)
Hemoglobin: 12.6 g/dL (ref 12.0–15.0)
MCHC: 34.6 g/dL (ref 30.0–36.0)
MCV: 94.3 fl (ref 78.0–100.0)
Platelets: 267 K/uL (ref 150.0–400.0)
RBC: 3.85 Mil/uL — ABNORMAL LOW (ref 3.87–5.11)
RDW: 14 % (ref 11.5–15.5)
WBC: 6.5 K/uL (ref 4.0–10.5)

## 2023-12-07 LAB — VITAMIN D 25 HYDROXY (VIT D DEFICIENCY, FRACTURES): VITD: 42.42 ng/mL (ref 30.00–100.00)

## 2023-12-07 LAB — VITAMIN B12: Vitamin B-12: 587 pg/mL (ref 211–911)

## 2023-12-07 NOTE — Telephone Encounter (Signed)
 Patient seen in office today.  Copied from CRM [id number]. Topic: General - Running Late >> Dec 07, 2023  2:23 PM Sasha M wrote: Patient/patient representative is calling because they are running late for an appointment.

## 2023-12-07 NOTE — Patient Instructions (Addendum)
 We will check the labs today. We can get you in with the neurologist if there is not a cause on the blood work.

## 2023-12-07 NOTE — Progress Notes (Unsigned)
   Subjective:   Patient ID: Angela [name], female    DOB: 12-Oct-1945, 78 y.o.   MRN: [id number]  Discussed the use of AI scribe software for clinical note transcription with the patient, who gave verbal consent to proceed.  History of Present Illness Angela [name] is a 78 year old female who presents with hand and foot tingling and numbness.  She has experienced tingling in her hands for several years, primarily when sitting idle, such as when reading or watching television. The tingling is more noticeable at night and has been occurring more frequently over time.  She also describes numbness in her feet, particularly in her toes, which she feels every day. There is a 'little painful' sensation when putting on socks or hose, affecting both feet. This symptom is relatively new compared to the hand tingling.  She has a history of seeing a neurologist who discovered a crooked carotid artery, and she currently sees a vascular doctor for follow up. She recalls seeing Dr. Ines for dizziness three to four years ago, during which time her symptoms were re-evaluated.  Her current medications include tramadol  and meloxicam, both taken as needed. She has stopped taking metformin .   Review of Systems  Constitutional: Negative.   HENT: Negative.    Eyes: Negative.   Respiratory:  Negative for cough, chest tightness and shortness of breath.   Cardiovascular:  Negative for chest pain, palpitations and leg swelling.  Gastrointestinal:  Negative for abdominal distention, abdominal pain, constipation, diarrhea, nausea and vomiting.  Musculoskeletal: Negative.   Skin: Negative.   Neurological:  Positive for numbness.  Psychiatric/Behavioral: Negative.      Objective:  Physical Exam Constitutional:      Appearance: She is well-developed.  HENT:     Head: Normocephalic and atraumatic.  Cardiovascular:     Rate and Rhythm: Normal rate and regular rhythm.  Pulmonary:     Effort: Pulmonary  effort is normal. No respiratory distress.     Breath sounds: Normal breath sounds. No wheezing or rales.  Abdominal:     General: Bowel sounds are normal. There is no distension.     Palpations: Abdomen is soft.     Tenderness: There is no abdominal tenderness. There is no rebound.  Musculoskeletal:     Cervical back: Normal range of motion.  Skin:    General: Skin is warm and dry.  Neurological:     Mental Status: She is alert and oriented to person, place, and time.     Sensory: Sensory deficit present.     Coordination: Coordination normal.     Comments: Decreased sensation midfoot     Vitals:   12/07/23 1449  BP: 128/84  Pulse: 65  Temp: 98.6 F (37 C)  TempSrc: Oral  SpO2: 96%  Weight: 178 lb 9.6 oz (81 kg)  Height: 5' 1 (1.549 m)    Assessment and Plan Assessment & Plan Peripheral neuropathy with numbness and tingling in hands and feet   Chronic neuropathy presents with increased numbness and tingling, especially when sedentary. Differential diagnosis includes age-related changes, vitamin deficiencies, thyroid  issues, and possible low back arthritis. Diabetes is not likely cause out due to well-controlled blood sugar levels. A neurologist referral is considered but deferred pending blood work results. Order blood tests for thyroid  function, vitamin D , and vitamin B12 levels. Continue tramadol  and meloxicam as needed. Consider neurologist referral if blood work does not reveal a treatable cause or if symptoms worsen.

## 2023-12-08 ENCOUNTER — Encounter (HOSPITAL_BASED_OUTPATIENT_CLINIC_OR_DEPARTMENT_OTHER): Payer: Self-pay

## 2023-12-08 ENCOUNTER — Emergency Department (HOSPITAL_BASED_OUTPATIENT_CLINIC_OR_DEPARTMENT_OTHER)
Admission: EM | Admit: 2023-12-08 | Discharge: 2023-12-08 | Disposition: A | Discharge status: Home / Self Care | Attending: Emergency Medicine | Admitting: Emergency Medicine

## 2023-12-08 ENCOUNTER — Emergency Department (HOSPITAL_BASED_OUTPATIENT_CLINIC_OR_DEPARTMENT_OTHER)

## 2023-12-08 DIAGNOSIS — I10 Essential (primary) hypertension: Secondary | ICD-10-CM | POA: Insufficient documentation

## 2023-12-08 DIAGNOSIS — Z79899 Other long term (current) drug therapy: Secondary | ICD-10-CM | POA: Insufficient documentation

## 2023-12-08 DIAGNOSIS — S0083XA Contusion of other part of head, initial encounter: Secondary | ICD-10-CM | POA: Diagnosis not present

## 2023-12-08 DIAGNOSIS — E039 Hypothyroidism, unspecified: Secondary | ICD-10-CM | POA: Insufficient documentation

## 2023-12-08 DIAGNOSIS — M542 Cervicalgia: Secondary | ICD-10-CM | POA: Insufficient documentation

## 2023-12-08 DIAGNOSIS — W2201XA Walked into wall, initial encounter: Secondary | ICD-10-CM | POA: Insufficient documentation

## 2023-12-08 DIAGNOSIS — Y9302 Activity, running: Secondary | ICD-10-CM | POA: Insufficient documentation

## 2023-12-08 DIAGNOSIS — M47812 Spondylosis without myelopathy or radiculopathy, cervical region: Secondary | ICD-10-CM | POA: Diagnosis not present

## 2023-12-08 DIAGNOSIS — R220 Localized swelling, mass and lump, head: Secondary | ICD-10-CM | POA: Diagnosis not present

## 2023-12-08 DIAGNOSIS — Z7982 Long term (current) use of aspirin: Secondary | ICD-10-CM | POA: Insufficient documentation

## 2023-12-08 DIAGNOSIS — S0990XA Unspecified injury of head, initial encounter: Secondary | ICD-10-CM | POA: Diagnosis not present

## 2023-12-08 DIAGNOSIS — M4802 Spinal stenosis, cervical region: Secondary | ICD-10-CM | POA: Diagnosis not present

## 2023-12-08 DIAGNOSIS — S199XXA Unspecified injury of neck, initial encounter: Secondary | ICD-10-CM | POA: Diagnosis not present

## 2023-12-08 DIAGNOSIS — M47813 Spondylosis without myelopathy or radiculopathy, cervicothoracic region: Secondary | ICD-10-CM | POA: Diagnosis not present

## 2023-12-08 NOTE — ED Triage Notes (Signed)
 Yesterday was helping daughter move and was carrying cleaning supplies and ran into a glass wall. Denies fall, but hit her head on the wall. Hematoma to frontal head. Denies blood thinners, takes 81mg  ASA.

## 2023-12-08 NOTE — Assessment & Plan Note (Signed)
 Prior vitamin D  deficiency and with worsening neuropathy will recheck vitamin D  and adjust as needed.

## 2023-12-08 NOTE — Assessment & Plan Note (Signed)
 Chronic neuropathy presents with increased numbness and tingling, especially when sedentary. Differential diagnosis includes age-related changes, vitamin deficiencies, thyroid  issues, and possible low back arthritis. Diabetes is not likely cause out due to well-controlled blood sugar levels. A neurologist referral is considered but deferred pending blood work results. Order blood tests for thyroid  function, vitamin D , and vitamin B12 levels. Continue tramadol  and meloxicam as needed. Consider neurologist referral if blood work does not reveal a treatable cause or if symptoms worsen.

## 2023-12-08 NOTE — ED Provider Notes (Signed)
 [facility] Provider Note   CSN: [id number] Arrival date & time: 12/08/23  1109     Patient presents with: Head Injury   Angela [name] is a 78 y.o. female.    Head Injury   78 year old female presents to the emergency department with complaints of head injury.  Was helping her daughter move in and actually ran into a glass wall that she felt was not there.  Hit the center of her forehead on it.  States he takes a daily aspirin  but denies any blood thinner use.  Denies any subsequent falling, trauma/injury elsewhere.  Denies any visual disturbance, gait abnormality, slurred speech, facial droop, weakness/sensory deficits in upper lower extremities from baseline.  States that she woke up this morning and had worsening swelling to her forehead talk to the family members and presents to the emergency department.  Requesting imaging.  The past medical history is significant for hypertension, hyperlipidemia, hypothyroidism, GERD, IBS, fibromyalgia, esophagitis/duodenitis, fibromuscular hyperplasia of artery, diverticulitis, osteoporosis  Prior to Admission medications   Medication Sig Start Date End Date Taking? Authorizing Provider  albuterol  (VENTOLIN  HFA) 108 (90 Base) MCG/ACT inhaler TAKE 2 PUFFS BY MOUTH EVERY 6 HOURS AS NEEDED FOR WHEEZE OR SHORTNESS OF BREATH 09/04/23   Rollene Almarie LABOR, MD  aspirin  81 MG tablet Take 81 mg by mouth daily.    [provider]  cholestyramine  (QUESTRAN ) 4 g packet Take 1 packet (4 g total) by mouth daily. At least 2 hours away from other meds 03/26/23   Esterwood, Amy S, PA-C  ergocalciferol  (DRISDOL ) 1.25 MG (50000 UT) capsule Take 50,000 Units by mouth daily at 2 am. 09/27/12   [provider]  esomeprazole  (NEXIUM ) 40 MG capsule TAKE 1 CAPSULE BY MOUTH 2 TIMES DAILY BEFORE A MEAL. 05/25/23   Mansouraty, Aloha Raddle., MD  famotidine  (PEPCID ) 20 MG tablet TAKE 1 TABLET BY MOUTH EVERYDAY AT  BEDTIME 12/12/22   Rollene Almarie LABOR, MD  fluocinonide gel (LIDEX) 0.05 %     [provider]  ketoconazole (NIZORAL) 2 % cream Apply 1 application  topically daily as needed for irritation.    [provider]  latanoprost  (XALATAN ) 0.005 % ophthalmic solution latanoprost  0.005 % eye drops  PLACE 1 DROP IN BOTH EYES AT BEDTIME    [provider]  levothyroxine  (SYNTHROID ) 50 MCG tablet TAKE 0.5 TABLETS (25 MCG TOTAL) BY MOUTH DAILY. 02/16/23   Rollene Almarie LABOR, MD  loratadine  (CLARITIN ) 10 MG tablet Take 1 tablet (10 mg total) by mouth daily. Patient not taking: Reported on 12/07/2023 12/23/22   Rollene Almarie LABOR, MD  meloxicam (MOBIC) 15 MG tablet Take 15 mg by mouth daily.    [provider]  metoprolol  tartrate (LOPRESSOR ) 50 MG tablet TAKE 1 TABLET BY MOUTH TWICE A DAY 11/12/23   Rollene Almarie LABOR, MD  metroNIDAZOLE  (METROCREAM ) 0.75 % cream     [provider]  mometasone  (NASONEX ) 50 MCG/ACT nasal spray Place 2 sprays into the nose daily. Patient not taking: Reported on 12/07/2023    [provider]  ondansetron  (ZOFRAN -ODT) 8 MG disintegrating tablet Take 1 tablet (8 mg total) by mouth every 8 (eight) hours as needed for nausea or vomiting. Patient not taking: Reported on 12/07/2023 04/23/23   Mansouraty, Gabriel Jr., MD  PARoxetine (PAXIL) 10 MG tablet Paxil 10 mg tablet  Take 1 tablet every day by oral route.    [provider]  prochlorperazine  (COMPAZINE ) 25 MG  suppository Place 1 suppository (25 mg total) rectally every 12 (twelve) hours as needed for nausea or vomiting. Patient not taking: Reported on 12/07/2023 04/23/23   Mansouraty, Aloha Raddle., MD  promethazine -dextromethorphan (PROMETHAZINE -DM) 6.25-15 MG/5ML syrup Take 2.5 mLs by mouth 4 (four) times daily as needed for cough. Patient not taking: Reported on 12/07/2023 11/21/22   Mayer, Jodi R, NP  rifaximin  (XIFAXAN ) 550 MG TABS tablet Take 1 tablet (550 mg  total) by mouth 3 (three) times daily. 03/26/23   Esterwood, Amy S, PA-C  rosuvastatin  (CRESTOR ) 20 MG tablet TAKE 1 TABLET BY MOUTH EVERY DAY 11/12/23   Rollene Almarie LABOR, MD  topiramate  (TOPAMAX ) 50 MG tablet TAKE 1 TABLET BY MOUTH EVERY DAY 03/16/23   Rollene Almarie LABOR, MD  traMADol  (ULTRAM ) 50 MG tablet Take 1 tablet (50 mg total) by mouth every 12 (twelve) hours as needed. 12/23/22   Rollene Almarie LABOR, MD  triamcinolone cream (KENALOG) 0.1 % Apply 1 application  topically 2 (two) times daily as needed (for breakout).    [provider]    Allergies: Shellfish allergy, Tetracycline, Effexor  xr [venlafaxine  hcl], Codeine, and Iohexol     Review of Systems  All other systems reviewed and are negative.   Updated Vital Signs BP 123/60 (BP Location: Right Arm)   Pulse 70   Temp 98.2 F (36.8 C) (Oral)   Resp 16   Ht 5' 1 (1.549 m)   Wt 81 kg   SpO2 98%   BMI 33.74 kg/m   Physical Exam Vitals and nursing note reviewed.  Constitutional:      General: She is not in acute distress.    Appearance: She is well-developed.  HENT:     Head: Normocephalic.     Comments: Large hematoma just right of midline forehead region.  No obvious palpable skull deformity. Eyes:     Conjunctiva/sclera: Conjunctivae normal.  Cardiovascular:     Rate and Rhythm: Normal rate and regular rhythm.  Pulmonary:     Effort: Pulmonary effort is normal. No respiratory distress.     Breath sounds: Normal breath sounds. No wheezing, rhonchi or rales.  Abdominal:     Palpations: Abdomen is soft.     Tenderness: There is no abdominal tenderness.  Musculoskeletal:        General: No swelling.     Cervical back: Neck supple.     Comments: Paraspinal tenderness noted bilaterally in cervical region.  No midline tenderness.  No midline tenderness to lumbar or thoracic spine.  No chest wall tenderness.  Able to range bilateral upper and lower extremities without overlying tenderness.  Skin:     General: Skin is warm and dry.     Capillary Refill: Capillary refill takes less than 2 seconds.  Neurological:     Mental Status: She is alert.     Comments: Alert and oriented to self, place, time and event.   Speech is fluent, clear without dysarthria or dysphasia.   Strength symmetric in upper/lower extremities   Sensation intact in upper/lower extremities except described decree sensation along the dorsal aspect of bilateral feet as well as hands diffusely bilaterally; symptoms predate had an injury and has followed with primary care regarding this.   Ambulates independently without obvious gait deviation. CN I not tested  CN II grossly intact visual fields bilaterally. Did not visualize posterior eye.  CN III, IV, VI PERRLA and EOMs intact bilaterally  CN V Intact sensation to sharp and light touch to the face  CN VII facial movements symmetric  CN VIII not tested  CN IX, X no uvula deviation, symmetric rise of soft palate  CN XI symmetric SCM and trapezius strength bilaterally  CN XII Midline tongue protrusion, symmetric L/R movements     Psychiatric:        Mood and Affect: Mood normal.     (all labs ordered are listed, but only abnormal results are displayed) Labs Reviewed - No data to display  EKG: None  Radiology: No results found.   Procedures   Medications Ordered in the ED - No data to display  Clinical Course as of 12/08/23 1237  Tue Dec 08, 2023  1209 This is a 78-year female presenting to ED with a mechanical injury reports she accidentally ran into a glass wall and struck her forehead earlier.  She is on aspirin .  This happened yesterday and she is concerned it is a hematoma and has expanded since then.  She denies headache, blurred vision, confusion.  Denies specific neck pain.  I personally reviewed her CT imaging with no emergent findings.  We discussed ice and compression to the hematoma, can follow-up as an outpatient for this. [MT]    Clinical  Course User Index [MT] Trifan, Donnice PARAS, MD                                 Medical Decision Making Amount and/or Complexity of Data Reviewed Radiology: ordered.   This patient presents to the ED for concern of head injury, this involves an extensive number of treatment options, and is a complaint that carries with it a high risk of complications and morbidity.  The differential diagnosis includes CVA, fracture, strain sprain, dislocation, concussion, other   Co morbidities that complicate the patient evaluation  See HPI   Additional history obtained:  Additional history obtained from EMR External records from outside source obtained and reviewed including hospital records   Lab Tests:  N/a   Imaging Studies ordered:  I ordered imaging studies including CT head/cervical spine  I independently visualized and interpreted imaging which showed no acute traumatic injury.  Multilevel degenerative changes cervical spine.  Soft tissue swelling forehead. I agree with the radiologist interpretation  Cardiac Monitoring: / EKG:  N/a   Consultations Obtained:  N/a   Problem List / ED Course / Critical interventions / Medication management  Head injury Reevaluation of the patient showed that the patient stayed the same I have reviewed the patients home medicines and have made adjustments as needed   Social Determinants of Health:  Denies tobacco or illicit drug use.   Test / Admission - Considered:  Head injury Vitals signs within normal range and stable throughout visit. Imaging studies significant for: see above  78 year old female presents to the emergency department with complaints of head injury.  Was helping her daughter move in and actually ran into a glass wall that she felt was not there.  Hit the center of her forehead on it.  States he takes a daily aspirin  but denies any blood thinner use.  Denies any subsequent falling, trauma/injury elsewhere.  Denies  any visual disturbance, gait abnormality, slurred speech, facial droop, weakness/sensory deficits in upper lower extremities from baseline.  States that she woke up this morning and had worsening swelling to her forehead talk to the family members and presents to the emergency department.  Requesting imaging. On exam, nonfocal neurologic exam from baseline.  Soft tissue swelling on the forehead.  Paraspinal tenderness noted in cervical spine but otherwise, no reproducible tenderness or appreciable traumatic injury on exam.  CT imaging was obtained given patient's  head injury in the setting of age as well as antiplatelet use chronically which was reassuring.  Will recommend symptomatic therapy at home follow-up with primary care for reassessment.  Treatment plan discussed with patient and she acknowledged understanding was agreeable.  Patient well-appearing, afebrile in no acute distress. Worrisome signs and symptoms were discussed with the patient, and the patient acknowledged understanding to return to the ED if noticed. Patient was stable upon discharge.       Final diagnoses:  None    ED Discharge Orders     None          Silver Wonda LABOR, [place] 12/08/23 1240    Cottie Donnice PARAS, MD 12/08/23 1436

## 2023-12-08 NOTE — Assessment & Plan Note (Signed)
 Checking HgA1c and complicated by hyperlipidemia. Adjust as needed and not on meds currently. Taking statin and not on ACE-I/ARB

## 2023-12-08 NOTE — Discharge Instructions (Signed)
 CT scan of your head showed no obvious bleed, skull fracture.  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  Recommend placing ice on your forehead to help with swelling.  He may take Tylenol  for pain.  Recommend follow-up primary care for reassessment.

## 2023-12-09 ENCOUNTER — Ambulatory Visit: Payer: Self-pay | Admitting: Internal Medicine

## 2024-01-29 DIAGNOSIS — M25512 Pain in left shoulder: Secondary | ICD-10-CM | POA: Diagnosis not present

## 2024-02-05 ENCOUNTER — Ambulatory Visit (INDEPENDENT_AMBULATORY_CARE_PROVIDER_SITE_OTHER)

## 2024-02-05 VITALS — BP 130/78 | HR 76 | Ht 61.25 in | Wt 178.4 lb

## 2024-02-05 DIAGNOSIS — Z0000 Encounter for general adult medical examination without abnormal findings: Secondary | ICD-10-CM

## 2024-02-05 NOTE — Patient Instructions (Addendum)
 [name],  Thank you for taking the time for your Medicare Wellness Visit. I appreciate your continued commitment to your health goals. Please review the care plan we discussed, and feel free to reach out if I can assist you further.  Medicare recommends these wellness visits once per year to help you and your care team stay ahead of potential health issues. These visits are designed to focus on prevention, allowing your provider to concentrate on managing your acute and chronic conditions during your regular appointments.  Please note that Annual Wellness Visits do not include a physical exam. Some assessments may be limited, especially if the visit was conducted virtually. If needed, we may recommend a separate in-person follow-up with your provider.  Ongoing Care Seeing your primary care provider every 3 to 6 months helps us  monitor your health and provide consistent, personalized care. Last office visit on 12/07/2023.  You are due for a Shingles vaccine and a tetanus, which can be done at your local pharmacy.  Remember to get your mammogram sent to this office and ask about a bone density screening.  Referrals If a referral was made during today's visit and you haven't received any updates within two weeks, please contact the referred provider directly to check on the status.  Recommended Screenings:  Health Maintenance  Topic Date Due   Eye exam for diabetics  Never done   Hepatitis C Screening  Never done   Zoster (Shingles) Vaccine (1 of 2) Never done   DTaP/Tdap/Td vaccine (2 - Td or Tdap) 09/15/2022   Hemoglobin A1C  06/08/2024   COVID-19 Vaccine (7 - 2025-26 season) 07/02/2024   Yearly kidney health urinalysis for diabetes  08/16/2024   Complete foot exam   08/16/2024   Yearly kidney function blood test for diabetes  12/06/2024   Medicare Annual Wellness Visit  02/04/2025   Pneumococcal Vaccine for age over 44  Completed   Flu Shot  Completed   DEXA scan (bone density  measurement)  Completed   Meningitis B Vaccine  Aged Out   Breast Cancer Screening  Discontinued   Colon Cancer Screening  Discontinued       12/08/2023   11:20 AM  Advanced Directives  Does Patient Have a Medical Advance Directive? No  Would patient like information on creating a medical advance directive? No - Patient declined   Advance Care Planning is important because it: Ensures you receive medical care that aligns with your values, goals, and preferences. Provides guidance to your family and loved ones, reducing the emotional burden of decision-making during critical moments.  Vision: Annual vision screenings are recommended for early detection of glaucoma, cataracts, and diabetic retinopathy. These exams can also reveal signs of chronic conditions such as diabetes and high blood pressure.  Dental: Annual dental screenings help detect early signs of oral cancer, gum disease, and other conditions linked to overall health, including heart disease and diabetes.  Please see the attached documents for additional preventive care recommendations.

## 2024-02-05 NOTE — Progress Notes (Signed)
 Subjective:   Angela [name] is a 78 y.o. who presents for a Medicare Wellness preventive visit.  As a reminder, Annual Wellness Visits don't include a physical exam, and some assessments may be limited, especially if this visit is performed virtually. We may recommend an in-person follow-up visit with your provider if needed.  Visit Complete: In person  Persons Participating in Visit: Patient.  AWV Questionnaire: No: Patient Medicare AWV questionnaire was not completed prior to this visit.  Cardiac Risk Factors include: advanced age (>41men, >23 women);diabetes mellitus;hypertension;dyslipidemia     Objective:    Today's Vitals   02/05/24 1353  BP: 130/78  Pulse: 76  SpO2: 99%  Weight: 178 lb 6.4 oz (80.9 kg)  Height: 5' 1.25 (1.556 m)   Body mass index is 33.43 kg/m.     02/05/2024    2:05 PM 12/08/2023   11:20 AM 01/29/2023   11:34 AM 02/11/2022   12:55 PM 01/27/2022   11:35 AM 08/01/2020   11:38 AM 08/01/2019   11:49 AM  Advanced Directives  Does Patient Have a Medical Advance Directive? No No No No No No No  Would patient like information on creating a medical advance directive?  No - Patient declined No - Patient declined  No - Patient declined No - Patient declined Yes (ED - Information included in AVS)    Current Medications (verified) Outpatient Encounter Medications as of 02/05/2024  Medication Sig   albuterol  (VENTOLIN  HFA) 108 (90 Base) MCG/ACT inhaler TAKE 2 PUFFS BY MOUTH EVERY 6 HOURS AS NEEDED FOR WHEEZE OR SHORTNESS OF BREATH   aspirin  81 MG tablet Take 81 mg by mouth daily.   cholestyramine  (QUESTRAN ) 4 g packet Take 1 packet (4 g total) by mouth daily. At least 2 hours away from other meds   ergocalciferol  (DRISDOL ) 1.25 MG (50000 UT) capsule Take 50,000 Units by mouth daily at 2 am.   esomeprazole  (NEXIUM ) 40 MG capsule TAKE 1 CAPSULE BY MOUTH 2 TIMES DAILY BEFORE A MEAL.   famotidine  (PEPCID ) 20 MG tablet TAKE 1 TABLET BY MOUTH EVERYDAY AT  BEDTIME   fluocinonide gel (LIDEX) 0.05 %    ketoconazole (NIZORAL) 2 % cream Apply 1 application  topically daily as needed for irritation.   latanoprost  (XALATAN ) 0.005 % ophthalmic solution latanoprost  0.005 % eye drops  PLACE 1 DROP IN BOTH EYES AT BEDTIME   levothyroxine  (SYNTHROID ) 50 MCG tablet TAKE 0.5 TABLETS (25 MCG TOTAL) BY MOUTH DAILY.   meloxicam (MOBIC) 15 MG tablet Take 15 mg by mouth daily.   metoprolol  tartrate (LOPRESSOR ) 50 MG tablet TAKE 1 TABLET BY MOUTH TWICE A DAY   metroNIDAZOLE  (METROCREAM ) 0.75 % cream    PARoxetine (PAXIL) 10 MG tablet Paxil 10 mg tablet  Take 1 tablet every day by oral route.   rifaximin  (XIFAXAN ) 550 MG TABS tablet Take 1 tablet (550 mg total) by mouth 3 (three) times daily.   rosuvastatin  (CRESTOR ) 20 MG tablet TAKE 1 TABLET BY MOUTH EVERY DAY   topiramate  (TOPAMAX ) 50 MG tablet TAKE 1 TABLET BY MOUTH EVERY DAY   traMADol  (ULTRAM ) 50 MG tablet Take 1 tablet (50 mg total) by mouth every 12 (twelve) hours as needed.   triamcinolone cream (KENALOG) 0.1 % Apply 1 application  topically 2 (two) times daily as needed (for breakout).   loratadine  (CLARITIN ) 10 MG tablet Take 1 tablet (10 mg total) by mouth daily. (Patient not taking: Reported on 02/05/2024)   mometasone  (NASONEX ) 50 MCG/ACT nasal spray  Place 2 sprays into the nose daily. (Patient not taking: Reported on 02/05/2024)   ondansetron  (ZOFRAN -ODT) 8 MG disintegrating tablet Take 1 tablet (8 mg total) by mouth every 8 (eight) hours as needed for nausea or vomiting. (Patient not taking: Reported on 02/05/2024)   prochlorperazine  (COMPAZINE ) 25 MG suppository Place 1 suppository (25 mg total) rectally every 12 (twelve) hours as needed for nausea or vomiting. (Patient not taking: Reported on 02/05/2024)   promethazine -dextromethorphan (PROMETHAZINE -DM) 6.25-15 MG/5ML syrup Take 2.5 mLs by mouth 4 (four) times daily as needed for cough. (Patient not taking: Reported on 02/05/2024)   No  facility-administered encounter medications on file as of 02/05/2024.    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   Fibromyalgia    Gastroparesis    GERD (gastroesophageal reflux disease) 09/13/2007   Glaucoma    Heart murmur    Hiatal hernia 04/25/2005   Hyperlipidemia    Hypertension    Hypothyroidism    IBS (irritable bowel syndrome)    Migraine    Osteoarthritis    Osteoporosis    Other and unspecified noninfectious gastroenteritis and colitis(558.9)    Sessile colonic polyp 05/30/2009   Vertigo    Vitamin D  deficiency    Past Surgical History:  Procedure Laterality Date   BUNIONECTOMY  09/04/2011   left   CESAREAN SECTION     CHOLECYSTECTOMY  1985   COLONOSCOPY     FOOT OSTEOTOMY  2006   Right foot   ROTATOR CUFF REPAIR Right 04/19/2020   TUBAL LIGATION  1983   VAGINAL HYSTERECTOMY  1993   Family History  Problem Relation Age of Onset   Mitral valve prolapse Mother    Aneurysm Mother        cerebral   Hypertension Father    Colon polyps Father    Stroke Father    Hypertension Sister        x2   Diabetes Sister    Colon polyps Sister    Colon cancer Maternal Aunt    Stomach cancer Maternal Aunt    Pancreatic cancer Neg Hx    Migraines Neg Hx    Esophageal cancer Neg Hx    Inflammatory bowel disease Neg Hx    Liver disease Neg Hx    Rectal cancer Neg Hx    Social History   Socioeconomic History   Marital status: Married    Spouse name: Elgin   Number of children: 2   Years of  education: Not on file   Highest education level: Not on file  Occupational History   Occupation: Retired  Tobacco Use   Smoking status: Never   Smokeless tobacco: Never  Vaping Use   Vaping status: Never Used  Substance and Sexual Activity   Alcohol use: No   Drug use: No   Sexual activity: Not on file  Other Topics Concern   Not on file  Social History Narrative   Lives at home with husband/2025   Right handed   Caffeine: 2 cups/day, sweet  tea   Social Drivers of Corporate investment banker Strain: Low Risk  (02/05/2024)   Overall Financial Resource Strain (CARDIA)    Difficulty of Paying Living Expenses: Not hard at all  Food Insecurity: No Food Insecurity (02/05/2024)   Hunger Vital Sign    Worried About Running Out of Food in the Last Year: Never true    Ran Out of Food in the Last Year: Never true  Transportation Needs: No Transportation Needs (02/05/2024)   PRAPARE - Administrator, Civil Service (Medical): No    Lack of Transportation (Non-Medical): No  Physical Activity: Inactive (02/05/2024)   Exercise Vital Sign    Days of Exercise per Week: 0 days    Minutes of Exercise per Session: 0 min  Stress: No Stress Concern Present (02/05/2024)   [employer] of Occupational Health - Occupational Stress Questionnaire    Feeling of Stress: Only a little  Social Connections: Socially Integrated (02/05/2024)   Social Connection and Isolation Panel    Frequency of Communication with Friends and Family: More than three times a week    Frequency of Social Gatherings with Friends and Family: Once a week    Attends Religious Services: More than 4 times per year    Active Member of [employer] or Organizations: Yes    Attends Engineer, structural: More than 4 times per year    Marital Status: Married    Tobacco Counseling Counseling given: Not Answered    Clinical Intake:  Pre-visit preparation completed: Yes  Pain : No/denies pain     BMI -  recorded: 33.43 Nutritional Status: BMI > 30  Obese Nutritional Risks: None Diabetes: Yes CBG done?: No Did pt. bring in CBG monitor from home?: No  Lab Results  Component Value Date   HGBA1C 7.3 (H) 12/07/2023   HGBA1C 7.1 (H) 08/17/2023   HGBA1C 6.7 (H) 07/09/2022     How often do you need to have someone help you when you read instructions, pamphlets, or other written materials from your doctor or pharmacy?: 1 - Never  Interpreter Needed?: No  Information entered by :: Quintez Maselli, RMA   Activities of Daily Living     02/05/2024    1:48 PM  In your present state of health, do you have any difficulty performing the following activities:  Hearing? 0  Vision? 0  Difficulty concentrating or making decisions? 0  Walking or climbing stairs? 0  Dressing or bathing? 0  Doing errands, shopping? 0  Preparing Food and eating ? N  Using the Toilet? N  In the past six months, have you accidently leaked urine? N  Do you have problems with loss of bowel control? N  Managing your Medications? N  Managing your Finances? N  Housekeeping or managing your Housekeeping? N    Patient Care Team: Rollene Almarie LABOR, MD as PCP - General (Internal Medicine) Pietro Redell RAMAN, MD as PCP - Cardiology (Cardiology) Pietro Redell RAMAN, MD as Consulting Physician (Cardiology) Teressa Toribio SQUIBB, MD (Inactive) as Attending Physician (Gastroenterology) Sheril Coy, MD as Consulting Physician (Orthopedic Surgery) Patrcia Sharper, MD as Consulting Physician (Ophthalmology) Beryl Johanna Cadet, MD as Referring Physician (Vascular Surgery) Lenon Oneil BRAVO, MD as Consulting Physician (Obstetrics and Gynecology)  I have updated your Care Teams any recent Medical Services you may have received from other providers in the past year.     Assessment:   This is a routine wellness examination for Clarisa.  Hearing/Vision screen Hearing  Screening - Comments:: Denies hearing difficulties   Vision  Screening - Comments:: Wears eyeglasses/ Dr. Josue up to date   Goals Addressed             This Visit's Progress    My goal is to get back into [employer] Program this winter, continue to watch my diet and stay independent.       Patient has stated walking some, but is still working on getting back with [employer]/2025       Depression Screen     02/05/2024    1:53 PM 01/29/2023   11:36 AM 07/09/2022   10:27 AM 01/27/2022   11:40 AM 11/04/2021    2:33 PM 06/10/2021    8:34 AM 08/01/2020   10:51 AM  PHQ 2/9 Scores  PHQ - 2 Score 0 0 0 0 0 0 0  PHQ- 9 Score 1 0         Fall Risk     02/05/2024    1:55 PM 08/17/2023    9:10 AM 01/29/2023   11:35 AM 12/23/2022    8:57 AM 07/09/2022   10:27 AM  Fall Risk   Falls in the past year? 0 0 0 0 0  Number falls in past yr: 0 0 0 0 0  Injury with Fall? 0 0 0 0 0  Risk for fall due to :   No Fall Risks  No Fall Risks  Follow up Falls evaluation completed;Falls prevention discussed Falls evaluation completed Falls prevention discussed Falls evaluation completed Falls evaluation completed    MEDICARE RISK AT HOME:  Medicare Risk at Home Any stairs in or around the home?: Yes (inside stairs) If so, are there any without handrails?: No Home free of loose throw rugs in walkways, pet beds, electrical cords, etc?: Yes Adequate lighting in your home to reduce risk of falls?: Yes Life alert?: No Use of a cane, walker or w/c?: No Grab bars in the bathroom?: Yes Shower chair or bench in shower?: No Elevated toilet seat or a handicapped toilet?: No  TIMED UP AND GO:  Was the test performed?  Yes  Length of time to ambulate 10 feet: 20 sec Gait steady and fast without use of assistive device  Cognitive Function: 6CIT completed    01/29/2023   11:36 AM  MMSE - Mini Mental State Exam  Not completed: Unable to complete        02/05/2024    2:07 PM 01/29/2023   11:36 AM 01/27/2022   11:54 AM 08/01/2019   11:51 AM   6CIT Screen  What Year? 0 points 0 points 0 points 0 points  What month? 0 points 0 points 0 points 0 points  What time? 0 points 0 points 0 points 0 points  Count back from 20 0 points 0 points 0 points 0 points  Months in reverse 0 points 0 points 0 points 0 points  Repeat phrase 0 points 0 points 0 points 0 points  Total Score 0 points 0 points 0 points 0 points    Immunizations Immunization History  Administered Date(s) Administered   Fluad Quad(high Dose 65+) 12/30/2021   INFLUENZA, HIGH DOSE SEASONAL PF 04/04/2015, 02/07/2016, 12/19/2016, 02/07/2021, 12/31/2022, 01/03/2024   Influenza Split 02/12/2009, 05/21/2010, 02/18/2011, 02/16/2012   Influenza Whole 02/12/2009, 05/21/2010   Influenza, Seasonal, Injecte, Preservative Fre 03/29/2014   Influenza,inj,Quad PF,6+ Mos 01/27/2013   Influenza-Unspecified 07/14/2011, 01/12/2014, 01/12/2018, 12/09/2018   PFIZER Comirnaty(Gray Top)Covid-19 Tri-Sucrose Vaccine 07/27/2020, 12/30/2021  PFIZER(Purple Top)SARS-COV-2 Vaccination 05/04/2019, 05/23/2019, 12/20/2019   PNEUMOCOCCAL CONJUGATE-20 12/31/2022   Pfizer(Comirnaty)Fall Seasonal Vaccine 12 years and older 01/03/2024   Pneumococcal Conjugate-13 05/31/2015   Pneumococcal Polysaccharide-23 03/29/2014   Tdap 09/14/2012    Screening Tests Health Maintenance  Topic Date Due   OPHTHALMOLOGY EXAM  Never done   Hepatitis C Screening  Never done   Zoster Vaccines- Shingrix (1 of 2) Never done   DTaP/Tdap/Td (2 - Td or Tdap) 09/15/2022   HEMOGLOBIN A1C  06/08/2024   COVID-19 Vaccine (7 - 2025-26 season) 07/02/2024   Diabetic kidney evaluation - Urine ACR  08/16/2024   FOOT EXAM  08/16/2024   Diabetic kidney evaluation - eGFR measurement  12/06/2024   Medicare Annual Wellness (AWV)  02/04/2025   Pneumococcal Vaccine: 50+ Years  Completed   Influenza Vaccine  Completed   DEXA SCAN  Completed   Meningococcal B Vaccine  Aged Out   Mammogram  Discontinued   Colonoscopy  Discontinued     Health Maintenance Items Addressed: Vaccines Due: Shingrix and Tetanus , Labs Due Hep C, See Nurse Notes at the end of this note  Additional Screening:  Vision Screening: Recommended annual ophthalmology exams for early detection of glaucoma and other disorders of the eye. Is the patient up to date with their annual eye exam?  Yes  Who is the provider or what is the name of the office in which the patient attends annual eye exams? Dr. Florentina pt-up to date.  Dental Screening: Recommended annual dental exams for proper oral hygiene  Community Resource Referral / Chronic Care Management: CRR required this visit?  No   CCM required this visit?  No   Plan:    I have personally reviewed and noted the following in the patient's chart:   Medical and social history Use of alcohol, tobacco or illicit drugs  Current medications and supplements including opioid prescriptions. Patient is not currently taking opioid prescriptions. Functional ability and status Nutritional status Physical activity Advanced directives List of other physicians Hospitalizations, surgeries, and ER visits in previous 12 months Vitals Screenings to include cognitive, depression, and falls Referrals and appointments  In addition, I have reviewed and discussed with patient certain preventive protocols, quality metrics, and best practice recommendations. A written personalized care plan for preventive services as well as general preventive health recommendations were provided to patient.   Tresean Mattix L Saryn Cherry, CMA   02/05/2024   After Visit Summary: (In Person-Printed) AVS printed and given to the patient  Notes: Patient is due for a Shingrix vaccine and a tdap.  She stated that she has had a diabetic eye exam recently.  I have sent a request to get results sent here.

## 2024-02-29 DIAGNOSIS — E785 Hyperlipidemia, unspecified: Secondary | ICD-10-CM | POA: Diagnosis not present

## 2024-02-29 DIAGNOSIS — F325 Major depressive disorder, single episode, in full remission: Secondary | ICD-10-CM | POA: Diagnosis not present

## 2024-02-29 DIAGNOSIS — Z7982 Long term (current) use of aspirin: Secondary | ICD-10-CM | POA: Diagnosis not present

## 2024-02-29 DIAGNOSIS — I129 Hypertensive chronic kidney disease with stage 1 through stage 4 chronic kidney disease, or unspecified chronic kidney disease: Secondary | ICD-10-CM | POA: Diagnosis not present

## 2024-02-29 DIAGNOSIS — K219 Gastro-esophageal reflux disease without esophagitis: Secondary | ICD-10-CM | POA: Diagnosis not present

## 2024-02-29 DIAGNOSIS — M1990 Unspecified osteoarthritis, unspecified site: Secondary | ICD-10-CM | POA: Diagnosis not present

## 2024-02-29 DIAGNOSIS — E039 Hypothyroidism, unspecified: Secondary | ICD-10-CM | POA: Diagnosis not present

## 2024-02-29 DIAGNOSIS — E669 Obesity, unspecified: Secondary | ICD-10-CM | POA: Diagnosis not present

## 2024-02-29 DIAGNOSIS — I700 Atherosclerosis of aorta: Secondary | ICD-10-CM | POA: Diagnosis not present

## 2024-03-30 ENCOUNTER — Other Ambulatory Visit: Payer: Self-pay | Admitting: Internal Medicine

## 2024-04-22 ENCOUNTER — Other Ambulatory Visit: Payer: Self-pay | Admitting: Internal Medicine

## 2024-04-24 ENCOUNTER — Other Ambulatory Visit: Payer: Self-pay | Admitting: Internal Medicine

## 2024-05-13 ENCOUNTER — Other Ambulatory Visit: Payer: Self-pay | Admitting: Physician Assistant

## 2024-05-13 ENCOUNTER — Other Ambulatory Visit: Payer: Self-pay | Admitting: Internal Medicine

## 2024-05-14 ENCOUNTER — Other Ambulatory Visit: Payer: Self-pay | Admitting: Internal Medicine

## 2024-05-17 ENCOUNTER — Ambulatory Visit: Payer: Self-pay | Admitting: Internal Medicine

## 2024-05-17 ENCOUNTER — Ambulatory Visit: Admitting: Internal Medicine

## 2024-05-17 VITALS — BP 110/60 | HR 75 | Temp 98.2°F | Ht 61.25 in | Wt 179.6 lb

## 2024-05-17 DIAGNOSIS — R413 Other amnesia: Secondary | ICD-10-CM

## 2024-05-17 LAB — COMPREHENSIVE METABOLIC PANEL WITH GFR
ALT: 26 U/L (ref 3–35)
AST: 34 U/L (ref 5–37)
Albumin: 4.2 g/dL (ref 3.5–5.2)
Alkaline Phosphatase: 75 U/L (ref 39–117)
BUN: 15 mg/dL (ref 6–23)
CO2: 24 meq/L (ref 19–32)
Calcium: 9.4 mg/dL (ref 8.4–10.5)
Chloride: 106 meq/L (ref 96–112)
Creatinine, Ser: 0.88 mg/dL (ref 0.40–1.20)
GFR: 63.22 mL/min
Glucose, Bld: 201 mg/dL — ABNORMAL HIGH (ref 70–99)
Potassium: 3.7 meq/L (ref 3.5–5.1)
Sodium: 140 meq/L (ref 135–145)
Total Bilirubin: 0.5 mg/dL (ref 0.2–1.2)
Total Protein: 7 g/dL (ref 6.0–8.3)

## 2024-05-17 LAB — CBC
HCT: 38.1 % (ref 36.0–46.0)
Hemoglobin: 13 g/dL (ref 12.0–15.0)
MCHC: 34.2 g/dL (ref 30.0–36.0)
MCV: 94.9 fl (ref 78.0–100.0)
Platelets: 234 10*3/uL (ref 150.0–400.0)
RBC: 4.01 Mil/uL (ref 3.87–5.11)
RDW: 13.2 % (ref 11.5–15.5)
WBC: 5.5 10*3/uL (ref 4.0–10.5)

## 2024-05-17 LAB — VITAMIN D 25 HYDROXY (VIT D DEFICIENCY, FRACTURES): VITD: 29.05 ng/mL — ABNORMAL LOW (ref 30.00–100.00)

## 2024-05-17 LAB — VITAMIN B12: Vitamin B-12: 640 pg/mL (ref 211–911)

## 2024-05-17 LAB — TSH: TSH: 1.5 u[IU]/mL (ref 0.35–5.50)

## 2024-05-17 NOTE — Patient Instructions (Signed)
 We will check the labs today and order an MRI of the brain to check this.

## 2024-05-20 ENCOUNTER — Encounter: Payer: Self-pay | Admitting: Internal Medicine

## 2024-05-20 DIAGNOSIS — R413 Other amnesia: Secondary | ICD-10-CM | POA: Insufficient documentation

## 2024-06-14 ENCOUNTER — Ambulatory Visit: Admitting: Diagnostic Neuroimaging

## 2024-07-12 ENCOUNTER — Ambulatory Visit: Admitting: Diagnostic Neuroimaging

## 2025-02-10 ENCOUNTER — Ambulatory Visit
